# Patient Record
Sex: Female | Born: 1945
Health system: Southern US, Community
[De-identification: ages and names within clinical notes are randomized; demographics above are authoritative.]

## PROBLEM LIST (undated history)

## (undated) DIAGNOSIS — M1710 Unilateral primary osteoarthritis, unspecified knee: Secondary | ICD-10-CM

## (undated) DIAGNOSIS — IMO0002 Reserved for concepts with insufficient information to code with codable children: Secondary | ICD-10-CM

## (undated) DIAGNOSIS — G4733 Obstructive sleep apnea (adult) (pediatric): Secondary | ICD-10-CM

## (undated) DIAGNOSIS — I219 Acute myocardial infarction, unspecified: Secondary | ICD-10-CM

## (undated) DIAGNOSIS — I2510 Atherosclerotic heart disease of native coronary artery without angina pectoris: Secondary | ICD-10-CM

## (undated) DIAGNOSIS — M545 Low back pain, unspecified: Secondary | ICD-10-CM

## (undated) DIAGNOSIS — T6391XA Toxic effect of contact with unspecified venomous animal, accidental (unintentional), initial encounter: Secondary | ICD-10-CM

## (undated) DIAGNOSIS — T7840XA Allergy, unspecified, initial encounter: Secondary | ICD-10-CM

## (undated) DIAGNOSIS — R42 Dizziness and giddiness: Secondary | ICD-10-CM

## (undated) DIAGNOSIS — Z85858 Personal history of malignant neoplasm of other endocrine glands: Secondary | ICD-10-CM

## (undated) DIAGNOSIS — M5126 Other intervertebral disc displacement, lumbar region: Secondary | ICD-10-CM

## (undated) DIAGNOSIS — M5136 Other intervertebral disc degeneration, lumbar region: Secondary | ICD-10-CM

## (undated) DIAGNOSIS — K219 Gastro-esophageal reflux disease without esophagitis: Secondary | ICD-10-CM

## (undated) DIAGNOSIS — M51369 Other intervertebral disc degeneration, lumbar region without mention of lumbar back pain or lower extremity pain: Secondary | ICD-10-CM

## (undated) DIAGNOSIS — E785 Hyperlipidemia, unspecified: Secondary | ICD-10-CM

## (undated) DIAGNOSIS — R300 Dysuria: Secondary | ICD-10-CM

## (undated) DIAGNOSIS — N63 Unspecified lump in unspecified breast: Secondary | ICD-10-CM

## (undated) DIAGNOSIS — E8881 Metabolic syndrome: Secondary | ICD-10-CM

## (undated) DIAGNOSIS — J309 Allergic rhinitis, unspecified: Secondary | ICD-10-CM

## (undated) DIAGNOSIS — I10 Essential (primary) hypertension: Secondary | ICD-10-CM

## (undated) DIAGNOSIS — M5416 Radiculopathy, lumbar region: Secondary | ICD-10-CM

## (undated) DIAGNOSIS — F419 Anxiety disorder, unspecified: Secondary | ICD-10-CM

## (undated) DIAGNOSIS — N189 Chronic kidney disease, unspecified: Secondary | ICD-10-CM

## (undated) DIAGNOSIS — IMO0001 Reserved for inherently not codable concepts without codable children: Secondary | ICD-10-CM

## (undated) DIAGNOSIS — I209 Angina pectoris, unspecified: Secondary | ICD-10-CM

## (undated) DIAGNOSIS — H209 Unspecified iridocyclitis: Secondary | ICD-10-CM

## (undated) DIAGNOSIS — R7309 Other abnormal glucose: Secondary | ICD-10-CM

## (undated) DIAGNOSIS — E559 Vitamin D deficiency, unspecified: Secondary | ICD-10-CM

## (undated) HISTORY — DX: Radiculopathy, lumbar region: M54.16

## (undated) HISTORY — DX: Allergy, unspecified, initial encounter: T78.40XA

## (undated) HISTORY — DX: Atherosclerotic heart disease of native coronary artery without angina pectoris: I25.10

## (undated) HISTORY — PX: LUMBAR FUSION: SHX111

## (undated) HISTORY — DX: Unspecified lump in unspecified breast: N63.0

## (undated) HISTORY — DX: Personal history of malignant neoplasm of other endocrine glands: Z85.858

## (undated) HISTORY — PX: TUBAL LIGATION: SHX77

## (undated) HISTORY — DX: Metabolic syndrome: E88.810

## (undated) HISTORY — DX: Angina pectoris, unspecified: I20.9

## (undated) HISTORY — DX: Reserved for inherently not codable concepts without codable children: IMO0001

## (undated) HISTORY — DX: Other intervertebral disc displacement, lumbar region: M51.26

## (undated) HISTORY — DX: Reserved for concepts with insufficient information to code with codable children: IMO0002

## (undated) HISTORY — DX: Other abnormal glucose: R73.09

## (undated) HISTORY — DX: Dysuria: R30.0

## (undated) HISTORY — DX: Essential (primary) hypertension: I10

## (undated) HISTORY — PX: CARDIAC CATHETERIZATION: SHX172

## (undated) HISTORY — DX: Allergic rhinitis, unspecified: J30.9

## (undated) HISTORY — DX: Low back pain: M54.5

## (undated) HISTORY — DX: Low back pain, unspecified: M54.50

## (undated) HISTORY — PX: COLONOSCOPY: SHX174

## (undated) HISTORY — DX: Hyperlipidemia, unspecified: E78.5

## (undated) HISTORY — DX: Vitamin D deficiency, unspecified: E55.9

## (undated) HISTORY — DX: Gastro-esophageal reflux disease without esophagitis: K21.9

## (undated) HISTORY — DX: Unspecified iridocyclitis: H20.9

## (undated) HISTORY — DX: Obstructive sleep apnea (adult) (pediatric): G47.33

## (undated) HISTORY — PX: CYSTOSTOMY: SHX155

## (undated) HISTORY — DX: Unilateral primary osteoarthritis, unspecified knee: M17.10

## (undated) HISTORY — DX: Toxic effect of contact with unspecified venomous animal, accidental (unintentional), initial encounter: T63.91XA

## (undated) HISTORY — DX: Metabolic syndrome: E88.81

---

## 1968-11-02 HISTORY — PX: ABDOMINAL HYSTERECTOMY: SHX81

## 1978-11-02 HISTORY — PX: ANKLE SURGERY: SHX546

## 2001-11-02 HISTORY — PX: CARDIAC CATHETERIZATION: SHX172

## 2001-11-02 HISTORY — PX: NECK SURGERY: SHX720

## 2003-01-25 ENCOUNTER — Encounter: Payer: Self-pay | Admitting: Neurosurgery

## 2003-01-31 ENCOUNTER — Encounter: Payer: Self-pay | Admitting: Neurosurgery

## 2003-01-31 ENCOUNTER — Ambulatory Visit (HOSPITAL_COMMUNITY): Admission: RE | Admit: 2003-01-31 | Discharge: 2003-02-01 | Payer: Self-pay | Admitting: Neurosurgery

## 2003-04-24 ENCOUNTER — Encounter: Admission: RE | Admit: 2003-04-24 | Discharge: 2003-04-24 | Payer: Self-pay | Admitting: Neurosurgery

## 2003-04-24 ENCOUNTER — Encounter: Payer: Self-pay | Admitting: Neurosurgery

## 2005-01-01 ENCOUNTER — Ambulatory Visit: Payer: Self-pay | Admitting: Unknown Physician Specialty

## 2005-02-11 ENCOUNTER — Ambulatory Visit: Payer: Self-pay

## 2005-11-13 ENCOUNTER — Ambulatory Visit: Payer: Self-pay | Admitting: Family Medicine

## 2006-01-28 ENCOUNTER — Ambulatory Visit: Payer: Self-pay | Admitting: Family Medicine

## 2006-03-14 ENCOUNTER — Emergency Department: Payer: Self-pay | Admitting: Emergency Medicine

## 2006-03-23 ENCOUNTER — Ambulatory Visit: Payer: Self-pay | Admitting: Gastroenterology

## 2007-02-23 ENCOUNTER — Ambulatory Visit: Payer: Self-pay

## 2007-03-04 ENCOUNTER — Ambulatory Visit: Payer: Self-pay

## 2007-09-07 ENCOUNTER — Ambulatory Visit: Payer: Self-pay | Admitting: Family Medicine

## 2008-01-31 ENCOUNTER — Ambulatory Visit: Payer: Self-pay | Admitting: Family Medicine

## 2008-03-07 ENCOUNTER — Ambulatory Visit: Payer: Self-pay | Admitting: General Surgery

## 2008-05-11 ENCOUNTER — Ambulatory Visit: Payer: Self-pay | Admitting: General Surgery

## 2008-06-11 ENCOUNTER — Ambulatory Visit: Payer: Self-pay | Admitting: General Surgery

## 2009-05-23 ENCOUNTER — Ambulatory Visit: Payer: Self-pay | Admitting: Neurosurgery

## 2009-06-11 ENCOUNTER — Encounter: Admission: RE | Admit: 2009-06-11 | Discharge: 2009-06-11 | Payer: Self-pay | Admitting: Neurosurgery

## 2009-06-18 ENCOUNTER — Ambulatory Visit: Payer: Self-pay | Admitting: General Surgery

## 2009-07-03 ENCOUNTER — Encounter: Admission: RE | Admit: 2009-07-03 | Discharge: 2009-07-03 | Payer: Self-pay | Admitting: Neurosurgery

## 2009-07-27 ENCOUNTER — Ambulatory Visit: Payer: Self-pay | Admitting: Family Medicine

## 2009-07-29 ENCOUNTER — Ambulatory Visit: Payer: Self-pay | Admitting: Internal Medicine

## 2009-10-13 ENCOUNTER — Ambulatory Visit: Payer: Self-pay | Admitting: Unknown Physician Specialty

## 2009-10-21 ENCOUNTER — Ambulatory Visit: Payer: Self-pay | Admitting: Unknown Physician Specialty

## 2010-02-06 ENCOUNTER — Ambulatory Visit: Payer: Self-pay | Admitting: Unknown Physician Specialty

## 2010-02-07 ENCOUNTER — Ambulatory Visit: Payer: Self-pay | Admitting: Unknown Physician Specialty

## 2010-02-11 ENCOUNTER — Ambulatory Visit: Payer: Self-pay | Admitting: Unknown Physician Specialty

## 2010-02-13 ENCOUNTER — Inpatient Hospital Stay: Payer: Self-pay | Admitting: Unknown Physician Specialty

## 2010-09-08 ENCOUNTER — Ambulatory Visit: Payer: Self-pay | Admitting: General Surgery

## 2010-11-02 HISTORY — PX: BLADDER SURGERY: SHX569

## 2010-11-24 ENCOUNTER — Encounter: Payer: Self-pay | Admitting: Neurosurgery

## 2010-12-09 ENCOUNTER — Ambulatory Visit: Payer: Self-pay | Admitting: Emergency Medicine

## 2010-12-30 ENCOUNTER — Ambulatory Visit: Payer: Self-pay | Admitting: Urology

## 2011-03-20 NOTE — Op Note (Signed)
Jodi Kirby, Jodi Kirby                        ACCOUNT NO.:  [id number]   MEDICAL RECORD NO.:  [id number]                   PATIENT TYPE:  OIB   LOCATION:  3172                                 FACILITY:  [facility]   PHYSICIAN:  Kathaleen Maser. Pool, M.D.                 DATE OF BIRTH:  04-06-1946   DATE OF PROCEDURE:  01/31/2003  DATE OF DISCHARGE:                                 OPERATIVE REPORT   PREOPERATIVE DIAGNOSES:  Left C5-6 herniated nucleus pulposus with  radiculopathy.   POSTOPERATIVE DIAGNOSES:  Left C5-6 herniated nucleus pulposus with  radiculopathy.   OPERATION PERFORMED:  C5-6 anterior cervical diskectomy and fusion with  allograft and anterior plating.   SURGEON:  Kathaleen Maser. Pool, M.D.   ASSISTANT:  Donalee Citrin, M.D.   ANESTHESIA:  General endotracheal.   INDICATIONS FOR PROCEDURE:  The patient is a 65 year old female with a  history of neck and left upper extremity pain consistent with a left-sided  C6 radiculopathy which has failed conservative management.  MRI scan  demonstrates evidence of a significant leftward C5-6 disk herniation with  compression of the left-sided C6 nerve root.  We discussed options available  for management including the possibility of undergoing a C5-6 anterior  cervical diskectomy and fusion with allograft and anterior plating for  hopeful improvement in her symptoms.  I discussed the risks and benefits  involved in the surgery and the patient wished to proceed.   DESCRIPTION OF PROCEDURE:  The patient was taken to the operating room and  placed on the table in supine position.  After adequate level of general  anesthesia was achieved, the patient was positioned with the neck slightly  extended and held in place with halter traction.  The patient's anterior  cervical region was prepped and draped sterilely.  A 10 blade was used to  make a linear skin incision overlying the C5-6 interspace through the skin,  carried down sharply to the  platysma.  The platysma was then divided  vertically and dissected proceeded along the medial border of the  sternocleidomastoid muscle and carotid sheath.  Trachea and esophagus were  mobilized and retracted toward the left.  Prevertebral fascia was stripped  off the anterior spinal column.  The longus colli muscles were then elevated  bilaterally using electrocautery.  Deep self-retaining retractor was placed.  Intraoperative fluoroscopy was used and level was confirmed.  The disk  spaces were then incised with a 15 blade in rectangular fashion.  A wide  disk space cleanout was then achieved using pituitary rongeurs, forward and  backward angled Carlens curets, Kerrison rongeurs and a high speed drill.  All elements of the disk were removed down to the posterior annulus.  Microscope was brought into the field and used for microdissection and to  complete the diskectomy.  The remaining aspects of the annulus and  osteophytes were removed using a high speed drill down  to the level of the  posterior longitudinal ligament.  The posterior longitudinal ligament was  then elevated and resected in piecemeal fashion using Kerrison rongeurs.  The underlying thecal sac was identified.  A wide central decompression was  then performed by undercutting the bodies of C5 and C6 using Kerrison  rongeurs.  All elements of the disk herniation out to the left side at C5-6  were completely resected.  The decompression then followed out to the left  sided C6 foramen.  The C6 nerve root was identified and a wide anterior  foraminotomy was performed.  Decompression proceeded out to the right side  at the C6 foramen.  The C6 nerve root was once again identified and found to  be free of any compression.  The wound was then irrigated with antibiotic  solution.  The disk space was then distracted and a 7 mm patellar wedge  allograft was then impacted into place and recessed approximately 1 mm from  the anterior  cortical surface.  A 23 mm Atlantis anterior cervical plate was  placed over the C5 and C6 levels.  This was then attached under fluoroscopic  guidance using 13 mm variable angle screws, two each at C5 and C6.  All four  screws were given a final tightening and found to be solidly within bone.  Locking screws were engaged at both levels.  Final images revealed good  position of bone grafts  and hardware at proper operative level with normal  alignment of the spine.  The wound was then inspected for hemostasis which  was found to be good.  It was then closed in typical fashion.  Steri-Strips  and sterile dressing were applied.  There were no apparent complications.  The patient tolerated the procedure well and returned to the recovery room  postoperatively.                                                 Henry A. Pool, M.D.    HAP/MEDQ  D:  01/31/2003  T:  01/31/2003  Job:  [id number]

## 2011-06-30 ENCOUNTER — Ambulatory Visit: Payer: Self-pay | Admitting: Internal Medicine

## 2011-07-02 ENCOUNTER — Ambulatory Visit: Payer: Self-pay

## 2011-07-13 ENCOUNTER — Ambulatory Visit: Payer: Self-pay | Admitting: Urology

## 2011-08-18 ENCOUNTER — Ambulatory Visit: Payer: Self-pay | Admitting: Unknown Physician Specialty

## 2011-08-18 HISTORY — PX: KNEE SURGERY: SHX244

## 2011-09-10 ENCOUNTER — Ambulatory Visit: Payer: Self-pay | Admitting: Family Medicine

## 2012-04-19 ENCOUNTER — Emergency Department: Payer: Self-pay | Admitting: *Deleted

## 2012-09-12 ENCOUNTER — Ambulatory Visit: Payer: Self-pay | Admitting: General Surgery

## 2013-02-08 ENCOUNTER — Ambulatory Visit: Payer: Self-pay | Admitting: Family Medicine

## 2013-02-09 ENCOUNTER — Ambulatory Visit (INDEPENDENT_AMBULATORY_CARE_PROVIDER_SITE_OTHER): Payer: Medicare Other | Admitting: Cardiovascular Disease

## 2013-02-09 ENCOUNTER — Encounter: Payer: Self-pay | Admitting: Cardiovascular Disease

## 2013-02-09 VITALS — BP 128/76 | HR 52 | Ht 63.0 in | Wt 183.5 lb

## 2013-02-09 DIAGNOSIS — I2510 Atherosclerotic heart disease of native coronary artery without angina pectoris: Secondary | ICD-10-CM

## 2013-02-09 DIAGNOSIS — I10 Essential (primary) hypertension: Secondary | ICD-10-CM

## 2013-02-09 DIAGNOSIS — G4733 Obstructive sleep apnea (adult) (pediatric): Secondary | ICD-10-CM

## 2013-02-09 DIAGNOSIS — I25118 Atherosclerotic heart disease of native coronary artery with other forms of angina pectoris: Secondary | ICD-10-CM | POA: Insufficient documentation

## 2013-02-09 DIAGNOSIS — R0789 Other chest pain: Secondary | ICD-10-CM

## 2013-02-09 DIAGNOSIS — Z9989 Dependence on other enabling machines and devices: Secondary | ICD-10-CM

## 2013-02-09 DIAGNOSIS — E785 Hyperlipidemia, unspecified: Secondary | ICD-10-CM | POA: Insufficient documentation

## 2013-02-09 MED ORDER — NITROGLYCERIN 0.4 MG SL SUBL: 0.4000 mg | SUBLINGUAL_TABLET | SUBLINGUAL | Status: DC | PRN

## 2013-02-09 NOTE — Assessment & Plan Note (Signed)
Blood pressure is well controlled on today's visit. No changes made to the medications. 

## 2013-02-09 NOTE — Assessment & Plan Note (Signed)
She reports that she is compliant with her CPAP, doing well

## 2013-02-09 NOTE — Addendum Note (Signed)
Addended by: Antonieta Iba on: 02/09/2013 01:16 PM   Modules accepted: Level of Service

## 2013-02-09 NOTE — Assessment & Plan Note (Signed)
Prior lab work was reviewed from October 2013. Most recent lab work not available. She is tolerating Crestor 2.5 mg daily. I suspect her cholesterol will continue to be elevated. Other options for her include zetia, though this is expensive. Second option would be colestipol (welchol), fenofibrate).

## 2013-02-09 NOTE — Progress Notes (Signed)
Patient ID: Jodi Kirby, female    DOB: 1946/03/07, 67 y.o.   MRN: [id number]  HPI Comments: Jodi Kirby is a very pleasant 67 year old woman with a history of mild carotid arterial disease on catheterization in 2003 with 20% disease in her RCA and LAD, history of hyperlipidemia, back surgery, obstructive sleep apnea on CPAP, who presents for evaluation by referral from Dr. Carlynn Purl for chest pressure.  She reports that for the past few weeks, she has had some "pressure-like" feelings in her left chest. Typically this comes on at rest while sitting. Symptoms last for several seconds and then resolve. Exertion does not make her symptoms worse. She thinks the symptoms are better over the past several weeks but she is still having them. No significant shortness of breath, edema, lightheadedness.she is active at baseline but does not do regular exercise.  Last stress test was several years ago She is tolerating Crestor 2.5 mg daily. Side effects on other statins including muscle cramps Recent lab work done but not available Prior lab work documents total cholesterol of 228 in October 2013 with LDL 146  EKG shows sinus bradycardia with rate 52 beats per minute with no significant ST or T wave changes   Outpatient Encounter Prescriptions as of 02/09/2013  Medication Sig Dispense Refill  . aspirin 81 MG tablet Take 81 mg by mouth daily.      . Calcium 600-200 MG-UNIT per tablet Take 1 tablet by mouth daily.      Marland Kitchen dexlansoprazole (DEXILANT) 60 MG capsule Take 60 mg by mouth daily.      Marland Kitchen EPINEPHrine (EPIPEN 2-PAK) 0.3 mg/0.3 mL DEVI Inject 0.3 mg into the muscle as needed.       . fluticasone (FLONASE) 50 MCG/ACT nasal spray Place 2 sprays into the nose daily.      Marland Kitchen levalbuterol (XOPENEX HFA) 45 MCG/ACT inhaler Inhale 1-2 puffs into the lungs every 4 (four) hours as needed for wheezing.      . Multiple Vitamins-Minerals (CENTRUM SILVER PO) Take by mouth daily.      .  Olmesartan-Amlodipine-HCTZ (TRIBENZOR) 40-10-12.5 MG TABS Take by mouth daily.      . rosuvastatin (CRESTOR) 5 MG tablet Take 1/2 to 1 tablet daily.      . nitroGLYCERIN (NITROSTAT) 0.4 MG SL tablet Place 1 tablet (0.4 mg total) under the tongue every 5 (five) minutes as needed for chest pain.  25 tablet  6   No facility-administered encounter medications on file as of 02/09/2013.   and  Review of Systems  Constitutional: Negative.   HENT: Negative.   Eyes: Negative.   Respiratory: Positive for chest tightness.   Cardiovascular: Positive for chest pain.  Gastrointestinal: Negative.   Musculoskeletal: Negative.   Skin: Negative.   Neurological: Negative.   Psychiatric/Behavioral: Negative.   All other systems reviewed and are negative.    BP 128/76  Pulse 52  Ht 5\' 3"  (1.6 m)  Wt 183 lb 8 oz (83.235 kg)  BMI 32.51 kg/m2  Physical Exam  Nursing note and vitals reviewed. Constitutional: She is oriented to person, place, and time. She appears well-developed and well-nourished.  HENT:  Head: Normocephalic.  Nose: Nose normal.  Mouth/Throat: Oropharynx is clear and moist.  Eyes: Conjunctivae are normal. Pupils are equal, round, and reactive to light.  Neck: Normal range of motion. Neck supple. No JVD present.  Cardiovascular: Normal rate, regular rhythm, S1 normal, S2 normal, normal heart sounds and intact distal pulses.  Exam reveals no gallop  and no friction rub.   No murmur heard. Pulmonary/Chest: Effort normal and breath sounds normal. No respiratory distress. She has no wheezes. She has no rales. She exhibits no tenderness.  Abdominal: Soft. Bowel sounds are normal. She exhibits no distension. There is no tenderness.  Musculoskeletal: Normal range of motion. She exhibits no edema and no tenderness.  Lymphadenopathy:    She has no cervical adenopathy.  Neurological: She is alert and oriented to person, place, and time. Coordination normal.  Skin: Skin is warm and dry. No  rash noted. No erythema.  Psychiatric: She has a normal mood and affect. Her behavior is normal. Judgment and thought content normal.    Assessment and Plan

## 2013-02-09 NOTE — Assessment & Plan Note (Signed)
20% disease in RCA and LAD on prior catheterization over 10 years ago. Would continue aggressive cholesterol management

## 2013-02-09 NOTE — Assessment & Plan Note (Addendum)
Etiology of her chest pain is uncertain though some atypical features. Symptoms happen at rest, last only several seconds. No symptoms with exertion. EKG essentially normal. We had a long discussion with her about various treatment options including medical management him a watchful waiting to see if symptoms progress, stress testing in the various types of stress test including treadmill, treadmill Myoview or pharmacologic Myoview. We also talked about potentially doing a cardiac catheterization. Old cardiac catheterization report was reviewed from 2003 that showed minimal disease. She does not know how she would like to proceed. Given her chest pain/pressure at rest, atypical presentation, minimal coronary disease on prior cardiac catheterization, I feel it is safe to watch her for now. We have given her a prescription for nitroglycerin sublingual for any severe chest pain. She will call us if symptoms get worse at which time we could decide  how to proceed. She wonders if it could be gas is sometimes she has relief with belching.

## 2013-02-09 NOTE — Patient Instructions (Addendum)
Please take nitroglycerin as needed for severe chest pain Call the office for worsening chest pain, we might do a stress test  Please call us if you have new issues that need to be addressed before your next appt.  Your physician wants you to follow-up in: 6 months.  You will receive a reminder letter in the mail two months in advance. If you don't receive a letter, please call our office to schedule the follow-up appointment.

## 2013-04-19 ENCOUNTER — Encounter: Payer: Self-pay | Admitting: *Deleted

## 2013-08-10 ENCOUNTER — Encounter: Payer: Self-pay | Admitting: Cardiovascular Disease

## 2013-08-10 ENCOUNTER — Ambulatory Visit (INDEPENDENT_AMBULATORY_CARE_PROVIDER_SITE_OTHER): Payer: Medicare Other | Admitting: Cardiovascular Disease

## 2013-08-10 VITALS — BP 118/72 | HR 51 | Ht 63.0 in | Wt 185.8 lb

## 2013-08-10 DIAGNOSIS — I10 Essential (primary) hypertension: Secondary | ICD-10-CM

## 2013-08-10 DIAGNOSIS — E785 Hyperlipidemia, unspecified: Secondary | ICD-10-CM

## 2013-08-10 DIAGNOSIS — I2510 Atherosclerotic heart disease of native coronary artery without angina pectoris: Secondary | ICD-10-CM

## 2013-08-10 DIAGNOSIS — R0789 Other chest pain: Secondary | ICD-10-CM

## 2013-08-10 NOTE — Assessment & Plan Note (Signed)
Suggested she stay on her crestor.

## 2013-08-10 NOTE — Assessment & Plan Note (Signed)
Blood pressure is well controlled on today's visit. No changes made to the medications. 

## 2013-08-10 NOTE — Patient Instructions (Addendum)
You are doing well. No medication changes were made.  We will schedule a stress test (lexiscan myoview) at [facility] for chest pain No caffeine for 24 hours before No food the morning of the test  Please call us if you have new issues that need to be addressed before your next appt.  Your physician wants you to follow-up in: 6 months.  You will receive a reminder letter in the mail two months in advance. If you don't receive a letter, please call our office to schedule the follow-up appointment.  [facility] MYOVIEW  Your caregiver has ordered a Stress Test with nuclear imaging. The purpose of this test is to evaluate the blood supply to your heart muscle. This procedure is referred to as a "Non-Invasive Stress Test." This is because other than having an IV started in your vein, nothing is inserted or "invades" your body. Cardiac stress tests are done to find areas of poor blood flow to the heart by determining the extent of coronary artery disease (CAD). Some patients exercise on a treadmill, which naturally increases the blood flow to your heart, while others who are  unable to walk on a treadmill due to physical limitations have a pharmacologic/chemical stress agent called Lexiscan . This medicine will mimic walking on a treadmill by temporarily increasing your coronary blood flow.   Please note: these test may take anywhere between 2-4 hours to complete  PLEASE REPORT TO [facility]ICAL [facility]ALL ENTRANCE  THE VOLUNTEERS AT THE FIRST DESK WILL DIRECT YOU WHERE TO GO  Date of Procedure:__________Wednesday, Oct 15_________  Arrival Time for Procedure:___________7:00 am___________    PLEASE NOTIFY THE OFFICE AT LEAST 24 HOURS IN ADVANCE IF YOU ARE UNABLE TO KEEP YOUR APPOINTMENT.  [phone] AND  PLEASE NOTIFY NUCLEAR MEDICINE AT [facility] AT LEAST 24 HOURS IN ADVANCE IF YOU ARE UNABLE TO KEEP YOUR APPOINTMENT. [phone]  How to prepare for your Myoview test:  1. Do not eat or drink after  midnight 2. No caffeine for 24 hours prior to test 3. No smoking 24 hours prior to test. 4. Your medication may be taken with water.  If your doctor stopped a medication because of this test, do not take that medication. 5. Ladies, please do not wear dresses.  Skirts or pants are appropriate. Please wear a short sleeve shirt. 6. No perfume, cologne or lotion. 7. Wear comfortable walking shoes. No heels!

## 2013-08-10 NOTE — Assessment & Plan Note (Signed)
She continues to have chest discomfort symptoms. She is unable to treadmill. We will schedule her for a pharmacologic Myoview.

## 2013-08-10 NOTE — Progress Notes (Addendum)
Patient ID: Jodi Kirby, female    DOB: [birth year], 67 y.o.   MRN: [id number]  HPI Comments: Jodi Kirby is a very pleasant 67 year old woman with a history of coronary artery arterial disease on catheterization in 2003 with 20% disease in her RCA and LAD, history of hyperlipidemia, back surgery, obstructive sleep apnea on CPAP, patient of Dr. Carlynn Purl who previously presented for symptoms of chest pressure. She presents for routine followup  On her last clinic visit, we had a long discussion about her symptoms. She preferred to hold off on any testing. In followup today, she reports having continued chest pain symptoms, feels tired. She is requesting a stress test. Is unable to treadmill secondary to previous back surgery, knee surgery, prior ankle fracture. He continues to have pressure-like symptoms in her chest, sometimes at rest, sometimes with exertion  Prior stress test was several years ago She is tolerating Crestor 2.5 mg daily. Side effects on other statins including muscle cramps Recent lab work done but not available Lab work April 2014 showing total cholesterol 162, LDL 76, HDL 58  EKG shows sinus bradycardia with rate 51 beats per minute with no significant ST or T wave changes   Outpatient Encounter Prescriptions as of 08/10/2013  Medication Sig Dispense Refill  . aspirin 81 MG tablet Take 81 mg by mouth daily.      . Calcium 600-200 MG-UNIT per tablet Take 1 tablet by mouth daily.      Marland Kitchen dexlansoprazole (DEXILANT) 60 MG capsule Take 60 mg by mouth daily.      Marland Kitchen EPINEPHrine (EPIPEN 2-PAK) 0.3 mg/0.3 mL DEVI Inject 0.3 mg into the muscle as needed.       . fluticasone (FLONASE) 50 MCG/ACT nasal spray Place 2 sprays into the nose daily.      Marland Kitchen levalbuterol (XOPENEX HFA) 45 MCG/ACT inhaler Inhale 1-2 puffs into the lungs every 4 (four) hours as needed for wheezing.      . Multiple Vitamins-Minerals (CENTRUM SILVER PO) Take by mouth daily.      . nitroGLYCERIN (NITROSTAT)  0.4 MG SL tablet Place 1 tablet (0.4 mg total) under the tongue every 5 (five) minutes as needed for chest pain.  25 tablet  6  . Olmesartan-Amlodipine-HCTZ (TRIBENZOR) 40-10-12.5 MG TABS Take by mouth daily.      . rosuvastatin (CRESTOR) 5 MG tablet Take 1/2 to 1 tablet daily.       No facility-administered encounter medications on file as of 08/10/2013.    Review of Systems  Constitutional: Negative.   HENT: Negative.   Eyes: Negative.   Respiratory: Positive for chest tightness.   Cardiovascular: Positive for chest pain.  Gastrointestinal: Negative.   Endocrine: Negative.   Musculoskeletal: Negative.   Skin: Negative.   Allergic/Immunologic: Negative.   Neurological: Negative.   Hematological: Negative.   Psychiatric/Behavioral: Negative.   All other systems reviewed and are negative.    BP 118/72  Pulse 51  Ht 5\' 3"  (1.6 m)  Wt 185 lb 12 oz (84.256 kg)  BMI 32.91 kg/m2  Physical Exam  Nursing note and vitals reviewed. Constitutional: She is oriented to person, place, and time. She appears well-developed and well-nourished.  HENT:  Head: Normocephalic.  Nose: Nose normal.  Mouth/Throat: Oropharynx is clear and moist.  Eyes: Conjunctivae are normal. Pupils are equal, round, and reactive to light.  Neck: Normal range of motion. Neck supple. No JVD present.  Cardiovascular: Normal rate, regular rhythm, S1 normal, S2 normal, normal heart sounds and intact  distal pulses.  Exam reveals no gallop and no friction rub.   No murmur heard. Pulmonary/Chest: Effort normal and breath sounds normal. No respiratory distress. She has no wheezes. She has no rales. She exhibits no tenderness.  Abdominal: Soft. Bowel sounds are normal. She exhibits no distension. There is no tenderness.  Musculoskeletal: Normal range of motion. She exhibits no edema and no tenderness.  Lymphadenopathy:    She has no cervical adenopathy.  Neurological: She is alert and oriented to person, place, and time.  Coordination normal.  Skin: Skin is warm and dry. No rash noted. No erythema.  Psychiatric: She has a normal mood and affect. Her behavior is normal. Judgment and thought content normal.    Assessment and Plan

## 2013-08-11 NOTE — Assessment & Plan Note (Signed)
Seen previously on preior cardiac cath. Now with persistent chest pain. Stress test ordered.

## 2013-08-16 ENCOUNTER — Ambulatory Visit: Payer: Self-pay | Admitting: Cardiovascular Disease

## 2013-08-16 DIAGNOSIS — R079 Chest pain, unspecified: Secondary | ICD-10-CM

## 2013-08-17 ENCOUNTER — Other Ambulatory Visit: Payer: Self-pay

## 2013-08-17 DIAGNOSIS — I2510 Atherosclerotic heart disease of native coronary artery without angina pectoris: Secondary | ICD-10-CM

## 2013-08-17 DIAGNOSIS — R0789 Other chest pain: Secondary | ICD-10-CM

## 2013-08-21 ENCOUNTER — Telehealth: Payer: Self-pay

## 2013-08-21 NOTE — Telephone Encounter (Signed)
Spoke w/ pt.  She is aware of results.  

## 2013-08-21 NOTE — Telephone Encounter (Signed)
Message copied by Marilynne Halsted on Mon Aug 21, 2013 10:22 AM ------      Message from: Antonieta Iba      Created: Sun Aug 20, 2013  6:49 PM       Normal stress myoview ------

## 2013-09-13 ENCOUNTER — Ambulatory Visit: Payer: Self-pay | Admitting: General Surgery

## 2013-09-14 ENCOUNTER — Encounter: Payer: Self-pay | Admitting: General Surgery

## 2013-09-25 ENCOUNTER — Ambulatory Visit (INDEPENDENT_AMBULATORY_CARE_PROVIDER_SITE_OTHER): Payer: Medicare Other | Admitting: General Surgery

## 2013-09-25 ENCOUNTER — Encounter: Payer: Self-pay | Admitting: General Surgery

## 2013-09-25 VITALS — BP 130/70 | HR 76 | Resp 14 | Ht 63.0 in | Wt 185.0 lb

## 2013-09-25 DIAGNOSIS — N6019 Diffuse cystic mastopathy of unspecified breast: Secondary | ICD-10-CM

## 2013-09-25 NOTE — Patient Instructions (Signed)
Bilateral screening mammogram in 1 year.

## 2013-09-25 NOTE — Progress Notes (Signed)
Patient ID: Jodi Kirby, female   DOB: [birth year], 67 y.o.   MRN: [id number]  Chief Complaint  Patient presents with  . Follow-up    mammogram    HPI Jodi Kirby is a 67 y.o. female who presents for a breast evaluation. The most recent mammogram was done on 09/13/13. Patient does perform regular self breast checks and gets regular mammograms done.    HPI  Past Medical History  Diagnosis Date  . Hyperlipidemia   . Coronary artery disease   . Obstructive sleep apnea   . Esophageal reflux   . Essential hypertension, benign   . Dysmetabolic syndrome X   . Other abnormal glucose   . Toxic effect of venom   . Allergic rhinitis, cause unspecified   . Unspecified asthma(493.90)   . Lumbago   . Osteoarthrosis, unspecified whether generalized or localized, lower leg   . Lump or mass in breast   . Dysuria   . 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 Unspecified iridocyclitis   . Unspecified vitamin D deficiency   . Other and unspecified angina pectoris   . Myalgia and myositis, unspecified   . MI (myocardial infarction)     Past Surgical History  Procedure Laterality Date  . Knee surgery  08/18/2011    arthroscopic right  . Lumbar fusion      L4-5, S-1  . Cystostomy    . Cardiac catheterization  2003    [place]  . Cardiac catheterization      [place]  . Neck surgery  2003  . Ankle surgery Right 1980  . Colonoscopy  2008    Dr. Servando Snare  . Tubal ligation    . Abdominal hysterectomy  1970  . Bladder surgery  2012    History reviewed. No pertinent family history.  Social History History  Substance Use Topics  . Smoking status: Never Smoker   . Smokeless tobacco: Never Used  . Alcohol Use: No    Allergies  Allergen Reactions  . Niaspan [Niacin Er]   . Statins     Current Outpatient Prescriptions  Medication Sig Dispense Refill  . acetaminophen-codeine (TYLENOL #3) 300-30 MG per tablet Take 1  tablet by mouth daily.      Marland Kitchen aspirin 81 MG tablet Take 81 mg by mouth daily.      . Calcium 600-200 MG-UNIT per tablet Take 1 tablet by mouth daily.      Marland Kitchen dexlansoprazole (DEXILANT) 60 MG capsule Take 60 mg by mouth daily.      Marland Kitchen EPINEPHrine (EPIPEN 2-PAK) 0.3 mg/0.3 mL DEVI Inject 0.3 mg into the muscle as needed.       . fluticasone (FLONASE) 50 MCG/ACT nasal spray Place 2 sprays into the nose daily.      Marland Kitchen levalbuterol (XOPENEX HFA) 45 MCG/ACT inhaler Inhale 1-2 puffs into the lungs every 4 (four) hours as needed for wheezing.      . Multiple Vitamins-Minerals (CENTRUM SILVER PO) Take by mouth daily.      . nitroGLYCERIN (NITROSTAT) 0.4 MG SL tablet Place 1 tablet (0.4 mg total) under the tongue every 5 (five) minutes as needed for chest pain.  25 tablet  6  . Olmesartan-Amlodipine-HCTZ (TRIBENZOR) 40-10-12.5 MG TABS Take by mouth daily.      . rosuvastatin (CRESTOR) 5 MG tablet Take 1/2 to 1 tablet daily.       No current facility-administered medications for this visit.    Review  of Systems Review of Systems  Constitutional: Negative.   Respiratory: Negative.   Cardiovascular: Negative.     Blood pressure 130/70, pulse 76, resp. rate 14, height 5\' 3"  (1.6 m), weight 185 lb (83.915 kg).  Physical Exam Physical Exam  Constitutional: She is oriented to person, place, and time. She appears well-developed and well-nourished.  Eyes: Conjunctivae are normal. No scleral icterus.  Neck: No thyromegaly present.  Cardiovascular: Normal rate, regular rhythm and normal heart sounds.   Pulmonary/Chest: Breath sounds normal.  Abdominal: Soft. Bowel sounds are normal. There is no hepatosplenomegaly. There is no tenderness. No hernia.  Lymphadenopathy:    She has no cervical adenopathy.    She has no axillary adenopathy.  Neurological: She is alert and oriented to person, place, and time.  Skin: Skin is warm.    Data Reviewed Mammogram reviewed  Assessment    Stable exam     Plan    Bilateral screening mammogram in 1 year.       Gianni Mihalik G 09/25/2013, 4:18 PM

## 2013-11-16 ENCOUNTER — Ambulatory Visit: Payer: Self-pay | Admitting: Family Medicine

## 2013-11-21 ENCOUNTER — Ambulatory Visit: Payer: Self-pay | Admitting: Family Medicine

## 2013-12-14 ENCOUNTER — Ambulatory Visit: Payer: Self-pay | Admitting: Gastroenterology

## 2014-03-18 ENCOUNTER — Emergency Department: Payer: Self-pay | Admitting: Emergency Medicine

## 2014-03-18 LAB — COMPREHENSIVE METABOLIC PANEL
ALBUMIN: 3.6 g/dL (ref 3.4–5.0)
ALT: 24 U/L (ref 12–78)
Alkaline Phosphatase: 91 U/L
Anion Gap: 7 (ref 7–16)
BUN: 11 mg/dL (ref 7–18)
Bilirubin,Total: 0.2 mg/dL (ref 0.2–1.0)
Calcium, Total: 8.9 mg/dL (ref 8.5–10.1)
Chloride: 107 mmol/L (ref 98–107)
Co2: 27 mmol/L (ref 21–32)
Creatinine: 1.34 mg/dL — ABNORMAL HIGH (ref 0.60–1.30)
EGFR (African American): 47 — ABNORMAL LOW
GFR CALC NON AF AMER: 41 — AB
Glucose: 106 mg/dL — ABNORMAL HIGH (ref 65–99)
Osmolality: 281 (ref 275–301)
Potassium: 3.6 mmol/L (ref 3.5–5.1)
SGOT(AST): 26 U/L (ref 15–37)
Sodium: 141 mmol/L (ref 136–145)
TOTAL PROTEIN: 7 g/dL (ref 6.4–8.2)

## 2014-03-18 LAB — TROPONIN I: Troponin-I: <0.02 ng/mL

## 2014-03-18 LAB — CBC
HCT: 36.3 % (ref 35.0–47.0)
HGB: 11.9 g/dL — AB (ref 12.0–16.0)
MCH: 27.4 pg (ref 26.0–34.0)
MCHC: 32.9 g/dL (ref 32.0–36.0)
MCV: 84 fL (ref 80–100)
Platelet: 212 10*3/uL (ref 150–440)
RBC: 4.34 10*6/uL (ref 3.80–5.20)
RDW: 12.3 % (ref 11.5–14.5)
WBC: 5.9 10*3/uL (ref 3.6–11.0)

## 2014-04-26 ENCOUNTER — Encounter: Payer: Self-pay | Admitting: General Surgery

## 2014-04-26 ENCOUNTER — Ambulatory Visit (INDEPENDENT_AMBULATORY_CARE_PROVIDER_SITE_OTHER): Payer: Medicare Other | Admitting: General Surgery

## 2014-04-26 VITALS — BP 118/80 | HR 86 | Resp 16 | Ht 63.0 in | Wt 181.0 lb

## 2014-04-26 DIAGNOSIS — N644 Mastodynia: Secondary | ICD-10-CM

## 2014-04-26 NOTE — Patient Instructions (Addendum)
Patient advised to take Advil or Aleve. Take 2 tabs by mouth twice daily with food for 10 days. Patient to return in 2 weeks for follow up. The patient is aware to call back for any questions or concerns.

## 2014-04-26 NOTE — Progress Notes (Signed)
Patient ID: Jodi Kirby, female   DOB: [birth year], 68 y.o.   MRN: [id number]  Chief Complaint  Patient presents with  . Follow-up    burning in left breast    HPI Jodi Kirby is a 68 y.o. female who presents for an evaluation of left breast burning. The patient states this started approximately 2 weeks ago. The burning sensation is also accompanied by a shooting pain at times. She states the pain comes and goes. The pain lasts for a couple of minutes. She denies any triggers associated to this issue.  No other complaints with the breasts at this time.   HPI  Past Medical History  Diagnosis Date  . Hyperlipidemia   . Coronary artery disease   . Obstructive sleep apnea   . Esophageal reflux   . Essential hypertension, benign   . Dysmetabolic syndrome X   . Other abnormal glucose   . Toxic effect of venom(989.5)   . Allergic rhinitis, cause unspecified   . Unspecified asthma(493.90)   . Lumbago   . Osteoarthrosis, unspecified whether generalized or localized, lower leg   . Lump or mass in breast   . Dysuria   . 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 Unspecified iridocyclitis   . Unspecified vitamin D deficiency   . Other and unspecified angina pectoris   . Myalgia and myositis, unspecified   . MI (myocardial infarction)     Past Surgical History  Procedure Laterality Date  . Knee surgery  08/18/2011    arthroscopic right  . Lumbar fusion      L4-5, S-1  . Cystostomy    . Cardiac catheterization  2003    [facility]  . Cardiac catheterization      [facility]  . Neck surgery  2003  . Ankle surgery Right 1980  . Colonoscopy  2008    Dr. Servando SnareWohl  . Tubal ligation    . Abdominal hysterectomy  1970  . Bladder surgery  2012    History reviewed. No pertinent family history.  Social History History  Substance Use Topics  . Smoking status: Never Smoker   . Smokeless tobacco: Never Used  . Alcohol Use: No     Allergies  Allergen Reactions  . Niaspan [Niacin Er]   . Statins     Current Outpatient Prescriptions  Medication Sig Dispense Refill  . acetaminophen-codeine (TYLENOL #3) 300-30 MG per tablet Take 1 tablet by mouth daily.      . Alendronate Sodium (FOSAMAX PO) Take 1 tablet by mouth once a week.      Marland Kitchen. aspirin 81 MG tablet Take 81 mg by mouth daily.      . Calcium 600-200 MG-UNIT per tablet Take 1 tablet by mouth daily.      Marland Kitchen. dexlansoprazole (DEXILANT) 60 MG capsule Take 60 mg by mouth daily.      Marland Kitchen. EPINEPHrine (EPIPEN 2-PAK) 0.3 mg/0.3 mL DEVI Inject 0.3 mg into the muscle as needed.       . fluticasone (FLONASE) 50 MCG/ACT nasal spray Place 2 sprays into the nose daily.      Marland Kitchen. levalbuterol (XOPENEX HFA) 45 MCG/ACT inhaler Inhale 1-2 puffs into the lungs every 4 (four) hours as needed for wheezing.      . Multiple Vitamins-Minerals (CENTRUM SILVER PO) Take by mouth daily.      . nitroGLYCERIN (NITROSTAT) 0.4 MG SL tablet Place 1 tablet (0.4 mg total) under the tongue every 5 (  five) minutes as needed for chest pain.  25 tablet  6  . Olmesartan-Amlodipine-HCTZ (TRIBENZOR) 40-10-12.5 MG TABS Take by mouth daily.      . rosuvastatin (CRESTOR) 5 MG tablet Take 1/2 to 1 tablet daily.       No current facility-administered medications for this visit.    Review of Systems Review of Systems  Constitutional: Negative.   Respiratory: Negative.   Cardiovascular: Negative.     Blood pressure 118/80, pulse 86, resp. rate 16, height 5\' 3"  (1.6 m), weight 181 lb (82.101 kg).  Physical Exam Physical Exam  Constitutional: She is oriented to person, place, and time. She appears well-developed and well-nourished.  Eyes: Conjunctivae are normal. No scleral icterus.  Neck: Neck supple. No thyromegaly present.  Pulmonary/Chest: Right breast exhibits no inverted nipple, no mass, no nipple discharge, no skin change and no tenderness. Left breast exhibits tenderness (mild tenderness throughout.  ). Left breast exhibits no inverted nipple, no mass, no nipple discharge and no skin change.    Lymphadenopathy:    She has no cervical adenopathy.    She has no axillary adenopathy.  Neurological: She is alert and oriented to person, place, and time.  Skin: Skin is warm and dry.    Data Reviewed None  Assessment    No findings to account for her pain. Advised a trial of AI-aleve or Advil daily for about 10 days. Will recheck in 2 weeks.    Plan    Patient to return in 2 weeks for follow up.        SANKAR,SEEPLAPUTHUR G 04/27/2014, 5:33 AM

## 2014-04-27 ENCOUNTER — Encounter: Payer: Self-pay | Admitting: General Surgery

## 2014-05-01 ENCOUNTER — Ambulatory Visit (INDEPENDENT_AMBULATORY_CARE_PROVIDER_SITE_OTHER): Payer: Medicare Other | Admitting: Cardiovascular Disease

## 2014-05-01 ENCOUNTER — Encounter: Payer: Self-pay | Admitting: Cardiovascular Disease

## 2014-05-01 VITALS — BP 130/62 | HR 53 | Ht 63.0 in | Wt 182.5 lb

## 2014-05-01 DIAGNOSIS — I2584 Coronary atherosclerosis due to calcified coronary lesion: Secondary | ICD-10-CM

## 2014-05-01 DIAGNOSIS — R0789 Other chest pain: Secondary | ICD-10-CM

## 2014-05-01 DIAGNOSIS — I10 Essential (primary) hypertension: Secondary | ICD-10-CM

## 2014-05-01 DIAGNOSIS — I2510 Atherosclerotic heart disease of native coronary artery without angina pectoris: Secondary | ICD-10-CM

## 2014-05-01 DIAGNOSIS — E785 Hyperlipidemia, unspecified: Secondary | ICD-10-CM

## 2014-05-01 NOTE — Assessment & Plan Note (Signed)
Currently with no symptoms of angina. No further workup at this time. Continue current medication regimen. 

## 2014-05-01 NOTE — Progress Notes (Signed)
Patient ID: Jodi HellingRosely J Kirby, female    DOB: 11-Jan-1946, 68 y.o.   MRN: [id number]  HPI Comments: Jodi Kirby is a very pleasant 68 year old woman with a history of coronary artery arterial disease on catheterization in 2003 with 20% disease in her RCA and LAD, history of hyperlipidemia, back surgery, obstructive sleep apnea on CPAP, patient of Dr. Carlynn Kirby who previously presented for symptoms of chest pressure. She presents for routine followup  In followup today, she reports that she is doing well. She has occasional knee pain, is sedentary in general with no regular exercise program. She is retired. Denies having any significant chest pain or shortness of breath with exertion. She does have chronic back, knee, ankle pain. This limits her ability to exercise Stress test in 2014 showing no ischemia  She is tolerating Crestor 2.5 mg daily. Side effects on other statins including muscle cramps Lab work April 2014 showing total cholesterol 162, LDL 76, HDL 58 Lab work done April 2013 showing total cholesterol 165, LDL 72  EKG shows sinus bradycardia with rate 53 beats per minute with no significant ST or T wave changes   Outpatient Encounter Prescriptions as of 05/01/2014  Medication Sig  . acetaminophen-codeine (TYLENOL #3) 300-30 MG per tablet Take 1 tablet by mouth daily.  . Alendronate Sodium (FOSAMAX PO) Take 1 tablet by mouth once a week.  Marland Kitchen. aspirin 81 MG tablet Take 81 mg by mouth daily.  . Calcium 600-200 MG-UNIT per tablet Take 1 tablet by mouth daily.  Marland Kitchen. dexlansoprazole (DEXILANT) 60 MG capsule Take 60 mg by mouth daily.  Marland Kitchen. EPINEPHrine (EPIPEN 2-PAK) 0.3 mg/0.3 mL DEVI Inject 0.3 mg into the muscle as needed.   . fluticasone (FLONASE) 50 MCG/ACT nasal spray Place 2 sprays into the nose daily.  Marland Kitchen. levalbuterol (XOPENEX HFA) 45 MCG/ACT inhaler Inhale 1-2 puffs into the lungs every 4 (four) hours as needed for wheezing.  . Multiple Vitamins-Minerals (CENTRUM SILVER PO) Take by mouth  daily.  . nitroGLYCERIN (NITROSTAT) 0.4 MG SL tablet Place 1 tablet (0.4 mg total) under the tongue every 5 (five) minutes as needed for chest pain.  . Olmesartan-Amlodipine-HCTZ (TRIBENZOR) 40-10-12.5 MG TABS Take by mouth daily.  . rosuvastatin (CRESTOR) 5 MG tablet Take 1/2 to 1 tablet daily.    Review of Systems  Constitutional: Negative.   HENT: Negative.   Eyes: Negative.   Respiratory: Negative.   Cardiovascular: Negative.   Gastrointestinal: Negative.   Endocrine: Negative.   Musculoskeletal: Negative.   Skin: Negative.   Allergic/Immunologic: Negative.   Neurological: Negative.   Hematological: Negative.   Psychiatric/Behavioral: Negative.   All other systems reviewed and are negative.   BP 130/62  Pulse 53  Ht 5\' 3"  (1.6 m)  Wt 182 lb 8 oz (82.781 kg)  BMI 32.34 kg/m2  Physical Exam  Nursing note and vitals reviewed. Constitutional: She is oriented to person, place, and time. She appears well-developed and well-nourished.  HENT:  Head: Normocephalic.  Nose: Nose normal.  Mouth/Throat: Oropharynx is clear and moist.  Eyes: Conjunctivae are normal. Pupils are equal, round, and reactive to light.  Neck: Normal range of motion. Neck supple. No JVD present.  Cardiovascular: Normal rate, regular rhythm, S1 normal, S2 normal, normal heart sounds and intact distal pulses.  Exam reveals no gallop and no friction rub.   No murmur heard. Pulmonary/Chest: Effort normal and breath sounds normal. No respiratory distress. She has no wheezes. She has no rales. She exhibits no tenderness.  Abdominal: Soft.  Bowel sounds are normal. She exhibits no distension. There is no tenderness.  Musculoskeletal: Normal range of motion. She exhibits no edema and no tenderness.  Lymphadenopathy:    She has no cervical adenopathy.  Neurological: She is alert and oriented to person, place, and time. Coordination normal.  Skin: Skin is warm and dry. No rash noted. No erythema.  Psychiatric: She  has a normal mood and affect. Her behavior is normal. Judgment and thought content normal.    Assessment and Plan

## 2014-05-01 NOTE — Patient Instructions (Signed)
You are doing well. No medication changes were made.  Please call us if you have new issues that need to be addressed before your next appt.  Your physician wants you to follow-up in: 12 months.  You will receive a reminder letter in the mail two months in advance. If you don't receive a letter, please call our office to schedule the follow-up appointment. 

## 2014-05-01 NOTE — Assessment & Plan Note (Signed)
No further chest discomfort. Prior stress test in 2014 showing no ischemia

## 2014-05-01 NOTE — Assessment & Plan Note (Signed)
Blood pressure is well controlled on today's visit. No changes made to the medications. 

## 2014-05-01 NOTE — Assessment & Plan Note (Signed)
Cholesterol is at goal on the current lipid regimen. No changes to the medications were made.  

## 2014-05-10 ENCOUNTER — Ambulatory Visit: Payer: Medicare Other | Admitting: General Surgery

## 2014-06-12 DIAGNOSIS — M1711 Unilateral primary osteoarthritis, right knee: Secondary | ICD-10-CM | POA: Insufficient documentation

## 2014-06-12 DIAGNOSIS — M1710 Unilateral primary osteoarthritis, unspecified knee: Secondary | ICD-10-CM

## 2014-06-19 ENCOUNTER — Ambulatory Visit: Payer: Self-pay | Admitting: Unknown Physician Specialty

## 2014-09-03 ENCOUNTER — Encounter: Payer: Self-pay | Admitting: Cardiovascular Disease

## 2014-09-24 ENCOUNTER — Ambulatory Visit (INDEPENDENT_AMBULATORY_CARE_PROVIDER_SITE_OTHER): Payer: Medicare Other | Admitting: General Surgery

## 2014-09-24 ENCOUNTER — Encounter: Payer: Self-pay | Admitting: General Surgery

## 2014-09-24 VITALS — BP 120/70 | HR 74 | Resp 12 | Ht 63.0 in | Wt 182.0 lb

## 2014-09-24 DIAGNOSIS — N63 Unspecified lump in breast: Secondary | ICD-10-CM

## 2014-09-24 DIAGNOSIS — N6019 Diffuse cystic mastopathy of unspecified breast: Secondary | ICD-10-CM | POA: Insufficient documentation

## 2014-09-24 DIAGNOSIS — N6320 Unspecified lump in the left breast, unspecified quadrant: Secondary | ICD-10-CM

## 2014-09-24 DIAGNOSIS — I2584 Coronary atherosclerosis due to calcified coronary lesion: Secondary | ICD-10-CM

## 2014-09-24 DIAGNOSIS — Z86010 Personal history of colonic polyps: Secondary | ICD-10-CM | POA: Insufficient documentation

## 2014-09-24 DIAGNOSIS — I2510 Atherosclerotic heart disease of native coronary artery without angina pectoris: Secondary | ICD-10-CM

## 2014-09-24 NOTE — Progress Notes (Signed)
Patient ID: Jodi HellingRosely J Kirby, female   DOB: July 29, 1946, 68 y.o.   MRN: [id number]  Chief Complaint  Patient presents with  . Follow-up    mammogram    HPI Jodi Kirby is a 68 y.o. female who presents for a breast evaluation. The most [facility]  Marland [facility]. Patient does perform regular self breast checks and gets regular mammograms done.  No new complaints at this time. Had recent colonoscopy-one polyp removed  HPI  Past Medical History  Diagnosis Date  . Hyperlipidemia   . Coronary artery disease   . Obstructive sleep apnea   . Esophageal reflux   . Essential hypertension, benign   . Dysmetabolic syndrome X   . Other abnormal glucose   . Toxic effect of venom(989.5)   . Allergic rhinitis, cause unspecified   . Unspecified asthma(493.90)   . Lumbago   . Osteoarthrosis, unspecified whether generalized or localized, lower leg   . Lump or mass in breast   . Dysuria   . 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 Unspecified iridocyclitis   . Unspecified vitamin D deficiency   . Other and unspecified angina pectoris   . Myalgia and myositis, unspecified   . MI (myocardial infarction)     Past Surgical History  Procedure Laterality Date  . Knee surgery  08/18/2011    arthroscopic right  . Lumbar fusion      L4-5, S-1  . Cystostomy    . Cardiac catheterization  2003    [facility]  . Cardiac catheterization      [facility]  . Neck surgery  2003  . Ankle surgery Right 1980  . Colonoscopy  2008, 2015    Dr. Servando SnareWohl  . Tubal ligation    . Abdominal hysterectomy  1970  . Bladder surgery  2012    Family History  Problem Relation Age of Onset  . Family history unknown: Yes    Social History History  Substance Use Topics  . Smoking status: Never Smoker   . Smokeless tobacco: Never Used  . Alcohol Use: No    Allergies  Allergen Reactions  . Niaspan [Niacin Er]   . Oxycodone-Acetaminophen  Nausea Only  . Statins     Current Outpatient Prescriptions  Medication Sig Dispense Refill  . acetaminophen-codeine (TYLENOL #3) 300-30 MG per tablet Take 1 tablet by mouth daily.    . Alendronate Sodium (FOSAMAX PO) Take 1 tablet by mouth once a week.    Marland Kitchen. aspirin 81 MG tablet Take 81 mg by mouth daily.    Marland Kitchen. dexlansoprazole (DEXILANT) 60 MG capsule Take 60 mg by mouth daily.    Marland Kitchen. EPINEPHrine (EPIPEN 2-PAK) 0.3 mg/0.3 mL DEVI Inject 0.3 mg into the muscle as needed.     . fluticasone (FLONASE) 50 MCG/ACT nasal spray Place 2 sprays into the nose daily.    Marland Kitchen. levalbuterol (XOPENEX HFA) 45 MCG/ACT inhaler Inhale 1-2 puffs into the lungs every 4 (four) hours as needed for wheezing.    . Multiple Vitamins-Minerals (CENTRUM SILVER PO) Take by mouth daily.    . nitroGLYCERIN (NITROSTAT) 0.4 MG SL tablet Place 1 tablet (0.4 mg total) under the tongue every 5 (five) minutes as needed for chest pain. 25 tablet 6  . Olmesartan-Amlodipine-HCTZ (TRIBENZOR) 40-10-12.5 MG TABS Take by mouth daily.    . rosuvastatin (CRESTOR) 5 MG tablet Take 1/2 to 1 tablet daily.     No current  facility-administered medications for this visit.    Review of Systems Review of Systems  Constitutional: Negative.   Respiratory: Negative.   Cardiovascular: Negative.     Blood pressure 120/70, pulse 74, resp. rate 12, height 5\' 3"  (1.6 m), weight 182 lb (82.555 kg).  Physical Exam Physical Exam  Constitutional: She is oriented to person, place, and time. She appears well-developed and well-nourished.  Eyes: Conjunctivae are normal. No scleral icterus.  Neck: Neck supple. No thyromegaly present.  Cardiovascular: Normal rate, regular rhythm and normal heart sounds.   No murmur heard. Pulmonary/Chest: Effort normal and breath sounds normal. Right breast exhibits no inverted nipple, no mass, no nipple discharge, no skin change and no tenderness. Left breast exhibits no inverted nipple, no mass, no nipple discharge, no  skin change and no tenderness.  Abdominal: Soft. Normal appearance and bowel sounds are normal. There is no hepatosplenomegaly. There is no tenderness. No hernia.  Lymphadenopathy:    She has no cervical adenopathy.    She has no axillary adenopathy.  Neurological: She is alert and oriented to person, place, and time.  Skin: Skin is warm and dry.    Data Reviewed  Mammogram reviewed and stable.   Assessment    Stable exam. History of prior breast mass, FCD. History of colon polyp    Plan    Patient to return in 1 year with bilateral screening mammogram.        SANKAR,SEEPLAPUTHUR G 09/24/2014, 9:07 AM

## 2014-09-24 NOTE — Patient Instructions (Signed)
Patient to return in 1 year with bilateral screening mammogram. Continue self breast exams. Call office for any new breast issues or concerns.  

## 2014-09-26 ENCOUNTER — Encounter: Payer: Self-pay | Admitting: General Surgery

## 2014-11-06 DIAGNOSIS — M7551 Bursitis of right shoulder: Secondary | ICD-10-CM | POA: Diagnosis not present

## 2014-11-06 DIAGNOSIS — M5412 Radiculopathy, cervical region: Secondary | ICD-10-CM | POA: Diagnosis not present

## 2014-11-27 DIAGNOSIS — S139XXD Sprain of joints and ligaments of unspecified parts of neck, subsequent encounter: Secondary | ICD-10-CM | POA: Diagnosis not present

## 2015-01-08 DIAGNOSIS — S139XXD Sprain of joints and ligaments of unspecified parts of neck, subsequent encounter: Secondary | ICD-10-CM | POA: Diagnosis not present

## 2015-02-04 ENCOUNTER — Ambulatory Visit
Admit: 2015-02-04 | Disposition: A | Discharge status: Home / Self Care | Payer: Self-pay | Attending: Family Medicine | Admitting: Family Medicine

## 2015-02-23 ENCOUNTER — Ambulatory Visit
Admit: 2015-02-23 | Disposition: A | Discharge status: Home / Self Care | Payer: Self-pay | Attending: Unknown Physician Specialty | Admitting: Unknown Physician Specialty

## 2015-03-20 DIAGNOSIS — G4733 Obstructive sleep apnea (adult) (pediatric): Secondary | ICD-10-CM | POA: Diagnosis not present

## 2015-03-21 ENCOUNTER — Ambulatory Visit
Admission: RE | Admit: 2015-03-21 | Discharge: 2015-03-21 | Disposition: A | Discharge status: Home / Self Care | Payer: Medicare PPO | Source: Ambulatory Visit | Attending: Family Medicine | Admitting: Family Medicine

## 2015-03-21 ENCOUNTER — Other Ambulatory Visit: Payer: Self-pay | Admitting: Family Medicine

## 2015-03-21 DIAGNOSIS — Z01818 Encounter for other preprocedural examination: Secondary | ICD-10-CM | POA: Diagnosis not present

## 2015-04-18 ENCOUNTER — Encounter
Admission: RE | Admit: 2015-04-18 | Discharge: 2015-04-18 | Disposition: A | Discharge status: Home / Self Care | Payer: Medicare PPO | Source: Ambulatory Visit | Attending: Unknown Physician Specialty | Admitting: Unknown Physician Specialty

## 2015-04-18 DIAGNOSIS — I10 Essential (primary) hypertension: Secondary | ICD-10-CM | POA: Diagnosis present

## 2015-04-18 DIAGNOSIS — I252 Old myocardial infarction: Secondary | ICD-10-CM | POA: Diagnosis not present

## 2015-04-18 DIAGNOSIS — M1711 Unilateral primary osteoarthritis, right knee: Secondary | ICD-10-CM | POA: Diagnosis present

## 2015-04-18 DIAGNOSIS — M6281 Muscle weakness (generalized): Secondary | ICD-10-CM | POA: Diagnosis not present

## 2015-04-18 DIAGNOSIS — Z79899 Other long term (current) drug therapy: Secondary | ICD-10-CM | POA: Diagnosis not present

## 2015-04-18 DIAGNOSIS — Z888 Allergy status to other drugs, medicaments and biological substances status: Secondary | ICD-10-CM | POA: Diagnosis not present

## 2015-04-18 DIAGNOSIS — M179 Osteoarthritis of knee, unspecified: Secondary | ICD-10-CM | POA: Diagnosis present

## 2015-04-18 DIAGNOSIS — Z7982 Long term (current) use of aspirin: Secondary | ICD-10-CM | POA: Diagnosis not present

## 2015-04-18 DIAGNOSIS — E785 Hyperlipidemia, unspecified: Secondary | ICD-10-CM | POA: Diagnosis present

## 2015-04-18 DIAGNOSIS — J45909 Unspecified asthma, uncomplicated: Secondary | ICD-10-CM | POA: Diagnosis present

## 2015-04-18 DIAGNOSIS — Z471 Aftercare following joint replacement surgery: Secondary | ICD-10-CM | POA: Diagnosis not present

## 2015-04-18 DIAGNOSIS — Z01810 Encounter for preprocedural cardiovascular examination: Secondary | ICD-10-CM | POA: Insufficient documentation

## 2015-04-18 DIAGNOSIS — G4733 Obstructive sleep apnea (adult) (pediatric): Secondary | ICD-10-CM | POA: Diagnosis present

## 2015-04-18 DIAGNOSIS — Z09 Encounter for follow-up examination after completed treatment for conditions other than malignant neoplasm: Secondary | ICD-10-CM | POA: Diagnosis not present

## 2015-04-18 DIAGNOSIS — Z01812 Encounter for preprocedural laboratory examination: Secondary | ICD-10-CM | POA: Diagnosis present

## 2015-04-18 DIAGNOSIS — Z885 Allergy status to narcotic agent status: Secondary | ICD-10-CM | POA: Diagnosis not present

## 2015-04-18 DIAGNOSIS — Z96651 Presence of right artificial knee joint: Secondary | ICD-10-CM | POA: Diagnosis not present

## 2015-04-18 DIAGNOSIS — I2510 Atherosclerotic heart disease of native coronary artery without angina pectoris: Secondary | ICD-10-CM | POA: Diagnosis present

## 2015-04-18 DIAGNOSIS — M609 Myositis, unspecified: Secondary | ICD-10-CM | POA: Diagnosis present

## 2015-04-18 DIAGNOSIS — M4647 Discitis, unspecified, lumbosacral region: Secondary | ICD-10-CM | POA: Diagnosis not present

## 2015-04-18 DIAGNOSIS — M810 Age-related osteoporosis without current pathological fracture: Secondary | ICD-10-CM | POA: Diagnosis present

## 2015-04-18 DIAGNOSIS — E8881 Metabolic syndrome: Secondary | ICD-10-CM | POA: Diagnosis present

## 2015-04-18 DIAGNOSIS — K219 Gastro-esophageal reflux disease without esophagitis: Secondary | ICD-10-CM | POA: Diagnosis present

## 2015-04-18 LAB — URINALYSIS COMPLETE WITH MICROSCOPIC (ARMC ONLY)
BACTERIA UA: NONE SEEN
Bilirubin Urine: NEGATIVE
GLUCOSE, UA: NEGATIVE mg/dL
HGB URINE DIPSTICK: NEGATIVE
Ketones, ur: NEGATIVE mg/dL
Leukocytes, UA: NEGATIVE
NITRITE: NEGATIVE
Protein, ur: NEGATIVE mg/dL
RBC / HPF: NONE SEEN RBC/hpf (ref 0–5)
Specific Gravity, Urine: 1.012 (ref 1.005–1.030)
pH: 6 (ref 5.0–8.0)

## 2015-04-18 LAB — SURGICAL PCR SCREEN
MRSA, PCR: NEGATIVE
STAPHYLOCOCCUS AUREUS: NEGATIVE

## 2015-04-18 LAB — ABO/RH: ABO/RH(D): O POS

## 2015-04-18 LAB — PROTIME-INR
INR: 0.94
PROTHROMBIN TIME: 12.8 s (ref 11.4–15.0)

## 2015-04-18 LAB — POTASSIUM: POTASSIUM: 3.3 mmol/L — AB (ref 3.5–5.1)

## 2015-04-18 LAB — APTT: aPTT: 30 seconds (ref 24–36)

## 2015-04-18 LAB — PREPARE RBC (CROSSMATCH)

## 2015-04-18 NOTE — Patient Instructions (Signed)
  Your procedure is scheduled on: Monday 6/27 Report to Day Surgery. Medical Valinda Hoar To find out your arrival time please call [phone] between 1PM - 3PM on Friday 6/24.  Remember: Instructions that are not followed completely may result in serious medical risk, up to and including death, or upon the discretion of your surgeon and anesthesiologist your surgery may need to be rescheduled.    __x__ 1. Do not eat food or drink liquids after midnight. No gum chewing or hard candies.     __x__ 2. No Alcohol for 24 hours before or after surgery.   ____ 3. Bring all medications with you on the day of surgery if instructed.    __x__ 4. Notify your doctor if there is any change in your medical condition     (cold, fever, infections).     Do not wear jewelry, make-up, hairpins, clips or nail polish.  Do not wear lotions, powders, or perfumes.   Do not shave 48 hours prior to surgery. Men may shave face and neck.  Do not bring valuables to the hospital.    [facility] is not responsible for any belongings or valuables.               Contacts, dentures or bridgework may not be worn into surgery.  Leave your suitcase in the car. After surgery it may be brought to your room.  For patients admitted to the hospital, discharge time is determined by your                treatment team.   Patients discharged the day of surgery will not be allowed to drive home.   Please read over the following fact sheets that you were given:   MRSA Information and Surgical Site Infection Prevention   __x__ Take these medicines the morning of surgery with A SIP OF WATER:    1. rosuvastatin  2. omeprazole  3. Use your ventolin inhaler  4.  5.  6.  ____ Fleet Enema (as directed)   __x__ Use CHG Soap as directed  __x__ Use inhalers on the day of surgery  ____ Stop metformin 2 days prior to surgery    ____ Take 1/2 of usual insulin dose the night before surgery and none on the morning of surgery.    __x__ Stop Coumadin/Plavix/aspirin on 6/22 5 days before surgery per Dr. Carlynn Purl  ____ Stop Anti-inflammatories on    ____ Stop supplements until after surgery.    __x__ Bring C-Pap to the hospital.

## 2015-04-18 NOTE — OR Nursing (Signed)
Patient stated she had just had lab work done recently and did not want to repeat if possible.

## 2015-04-20 DIAGNOSIS — M1711 Unilateral primary osteoarthritis, right knee: Secondary | ICD-10-CM | POA: Diagnosis not present

## 2015-04-22 NOTE — OR Nursing (Signed)
Potassium 3.3 on 04/18/15 was faxed and called to surgeons office. Recheck am surgery

## 2015-04-25 ENCOUNTER — Encounter: Payer: Self-pay | Admitting: Family Medicine

## 2015-04-26 ENCOUNTER — Ambulatory Visit: Payer: BC Managed Care – PPO | Admitting: Cardiovascular Disease

## 2015-04-29 ENCOUNTER — Encounter: Payer: Self-pay | Admitting: Anesthesiology

## 2015-04-29 ENCOUNTER — Inpatient Hospital Stay: Payer: Medicare PPO

## 2015-04-29 ENCOUNTER — Inpatient Hospital Stay
Admission: RE | Admit: 2015-04-29 | Discharge: 2015-05-02 | DRG: 470 | Disposition: A | Discharge status: Home Health | Payer: Medicare PPO | Source: Ambulatory Visit | Attending: Unknown Physician Specialty | Admitting: Unknown Physician Specialty

## 2015-04-29 ENCOUNTER — Inpatient Hospital Stay: Payer: Medicare PPO | Admitting: Anesthesiology

## 2015-04-29 ENCOUNTER — Encounter: Payer: Self-pay | Admitting: Family Medicine

## 2015-04-29 ENCOUNTER — Encounter
Admission: RE | Disposition: A | Discharge status: Home Health | Payer: Self-pay | Source: Ambulatory Visit | Attending: Unknown Physician Specialty

## 2015-04-29 DIAGNOSIS — M1711 Unilateral primary osteoarthritis, right knee: Secondary | ICD-10-CM | POA: Diagnosis present

## 2015-04-29 DIAGNOSIS — J45909 Unspecified asthma, uncomplicated: Secondary | ICD-10-CM | POA: Diagnosis present

## 2015-04-29 DIAGNOSIS — Z888 Allergy status to other drugs, medicaments and biological substances status: Secondary | ICD-10-CM | POA: Diagnosis not present

## 2015-04-29 DIAGNOSIS — M179 Osteoarthritis of knee, unspecified: Secondary | ICD-10-CM | POA: Diagnosis present

## 2015-04-29 DIAGNOSIS — M810 Age-related osteoporosis without current pathological fracture: Secondary | ICD-10-CM | POA: Diagnosis present

## 2015-04-29 DIAGNOSIS — M609 Myositis, unspecified: Secondary | ICD-10-CM | POA: Diagnosis present

## 2015-04-29 DIAGNOSIS — E8881 Metabolic syndrome: Secondary | ICD-10-CM | POA: Diagnosis present

## 2015-04-29 DIAGNOSIS — Z79899 Other long term (current) drug therapy: Secondary | ICD-10-CM | POA: Diagnosis not present

## 2015-04-29 DIAGNOSIS — G4733 Obstructive sleep apnea (adult) (pediatric): Secondary | ICD-10-CM | POA: Diagnosis present

## 2015-04-29 DIAGNOSIS — I2510 Atherosclerotic heart disease of native coronary artery without angina pectoris: Secondary | ICD-10-CM | POA: Diagnosis present

## 2015-04-29 DIAGNOSIS — G8918 Other acute postprocedural pain: Secondary | ICD-10-CM

## 2015-04-29 DIAGNOSIS — I252 Old myocardial infarction: Secondary | ICD-10-CM

## 2015-04-29 DIAGNOSIS — Z09 Encounter for follow-up examination after completed treatment for conditions other than malignant neoplasm: Secondary | ICD-10-CM

## 2015-04-29 DIAGNOSIS — I10 Essential (primary) hypertension: Secondary | ICD-10-CM | POA: Diagnosis present

## 2015-04-29 DIAGNOSIS — Z7982 Long term (current) use of aspirin: Secondary | ICD-10-CM | POA: Diagnosis not present

## 2015-04-29 DIAGNOSIS — E785 Hyperlipidemia, unspecified: Secondary | ICD-10-CM | POA: Diagnosis present

## 2015-04-29 DIAGNOSIS — Z96651 Presence of right artificial knee joint: Secondary | ICD-10-CM | POA: Insufficient documentation

## 2015-04-29 DIAGNOSIS — Z885 Allergy status to narcotic agent status: Secondary | ICD-10-CM

## 2015-04-29 DIAGNOSIS — K219 Gastro-esophageal reflux disease without esophagitis: Secondary | ICD-10-CM | POA: Diagnosis present

## 2015-04-29 DIAGNOSIS — Z9889 Other specified postprocedural states: Secondary | ICD-10-CM

## 2015-04-29 HISTORY — PX: TOTAL KNEE ARTHROPLASTY: SHX125

## 2015-04-29 LAB — CBC
HEMATOCRIT: 35.5 % (ref 35.0–47.0)
Hemoglobin: 11.2 g/dL — ABNORMAL LOW (ref 12.0–16.0)
MCH: 27.1 pg (ref 26.0–34.0)
MCHC: 31.6 g/dL — ABNORMAL LOW (ref 32.0–36.0)
MCV: 85.7 fL (ref 80.0–100.0)
PLATELETS: 250 10*3/uL (ref 150–440)
RBC: 4.14 MIL/uL (ref 3.80–5.20)
RDW: 12.9 % (ref 11.5–14.5)
WBC: 17.1 10*3/uL — AB (ref 3.6–11.0)

## 2015-04-29 LAB — POTASSIUM
Potassium, serum: 3.8
Potassium: 4.1 mmol/L (ref 3.5–5.1)

## 2015-04-29 LAB — CREATININE, SERUM
Creatinine, Ser: 1.04 mg/dL — ABNORMAL HIGH (ref 0.44–1.00)
GFR calc Af Amer: >60 mL/min (ref >60)
GFR calc non Af Amer: 54 mL/min — ABNORMAL LOW (ref >60)

## 2015-04-29 SURGERY — ARTHROPLASTY, KNEE, TOTAL
Anesthesia: General | Site: Knee | Laterality: Right | Wound class: Clean

## 2015-04-29 MED ORDER — VALSARTAN 160 MG PO TABS
160.0000 mg | ORAL_TABLET | Freq: Every day | ORAL | Status: DC
  Administered 2015-04-30 – 2015-05-02 (×3): 160 mg via ORAL
  Filled 2015-04-29 (×3): qty 1

## 2015-04-29 MED ORDER — AMLODIPINE-VALSARTAN-HCTZ 5-160-25 MG PO TABS: 1.0000 | ORAL_TABLET | Freq: Every day | ORAL | Status: DC

## 2015-04-29 MED ORDER — BUPIVACAINE-EPINEPHRINE (PF) 0.5% -1:200000 IJ SOLN
INTRAMUSCULAR | Status: AC
  Filled 2015-04-29: qty 30

## 2015-04-29 MED ORDER — TRANEXAMIC ACID 1000 MG/10ML IV SOLN
INTRAVENOUS | Status: AC
  Filled 2015-04-29: qty 10

## 2015-04-29 MED ORDER — FLUTICASONE PROPIONATE 50 MCG/ACT NA SUSP
2.0000 | Freq: Every day | NASAL | Status: DC
  Administered 2015-04-29 – 2015-05-01 (×2): 2 via NASAL
  Filled 2015-04-29: qty 16

## 2015-04-29 MED ORDER — NITROGLYCERIN 0.4 MG SL SUBL: 0.4000 mg | SUBLINGUAL_TABLET | SUBLINGUAL | Status: DC | PRN

## 2015-04-29 MED ORDER — HYDROMORPHONE HCL 1 MG/ML IJ SOLN
1.0000 mg | INTRAMUSCULAR | Status: DC | PRN
  Administered 2015-04-29 – 2015-04-30 (×4): 1 mg via INTRAVENOUS
  Filled 2015-04-29 (×4): qty 1

## 2015-04-29 MED ORDER — CEFAZOLIN SODIUM 1-5 GM-% IV SOLN
1.0000 g | Freq: Three times a day (TID) | INTRAVENOUS | Status: AC
  Administered 2015-04-29 – 2015-04-30 (×3): 1 g via INTRAVENOUS
  Filled 2015-04-29 (×3): qty 50

## 2015-04-29 MED ORDER — ONDANSETRON HCL 4 MG/2ML IJ SOLN: 4.0000 mg | Freq: Once | INTRAMUSCULAR | Status: DC | PRN

## 2015-04-29 MED ORDER — ALBUTEROL SULFATE HFA 108 (90 BASE) MCG/ACT IN AERS: 2.0000 | INHALATION_SPRAY | Freq: Four times a day (QID) | RESPIRATORY_TRACT | Status: DC | PRN

## 2015-04-29 MED ORDER — ACETAMINOPHEN 10 MG/ML IV SOLN
INTRAVENOUS | Status: DC | PRN
  Administered 2015-04-29: 1000 mg via INTRAVENOUS

## 2015-04-29 MED ORDER — ALBUTEROL SULFATE (2.5 MG/3ML) 0.083% IN NEBU: 2.5000 mg | INHALATION_SOLUTION | Freq: Four times a day (QID) | RESPIRATORY_TRACT | Status: DC | PRN

## 2015-04-29 MED ORDER — CEFAZOLIN SODIUM 1-5 GM-% IV SOLN
INTRAVENOUS | Status: AC
  Filled 2015-04-29: qty 50

## 2015-04-29 MED ORDER — POLYETHYLENE GLYCOL 3350 17 G PO PACK
17.0000 g | PACK | Freq: Every day | ORAL | Status: DC | PRN
  Administered 2015-04-30: 17 g via ORAL
  Filled 2015-04-29: qty 1

## 2015-04-29 MED ORDER — ENOXAPARIN SODIUM 30 MG/0.3ML ~~LOC~~ SOLN
30.0000 mg | Freq: Two times a day (BID) | SUBCUTANEOUS | Status: DC
  Administered 2015-04-30 – 2015-05-02 (×5): 30 mg via SUBCUTANEOUS
  Filled 2015-04-29 (×5): qty 0.3

## 2015-04-29 MED ORDER — PANTOPRAZOLE SODIUM 40 MG PO TBEC
80.0000 mg | DELAYED_RELEASE_TABLET | Freq: Every day | ORAL | Status: DC
  Administered 2015-04-30 – 2015-05-01 (×2): 80 mg via ORAL
  Filled 2015-04-29 (×2): qty 2

## 2015-04-29 MED ORDER — ROCURONIUM BROMIDE 100 MG/10ML IV SOLN
INTRAVENOUS | Status: DC | PRN
  Administered 2015-04-29: 40 mg via INTRAVENOUS

## 2015-04-29 MED ORDER — ONDANSETRON HCL 4 MG/2ML IJ SOLN
4.0000 mg | Freq: Four times a day (QID) | INTRAMUSCULAR | Status: DC | PRN
  Administered 2015-04-29 (×2): 4 mg via INTRAVENOUS
  Filled 2015-04-29 (×2): qty 2

## 2015-04-29 MED ORDER — LACTATED RINGERS IV SOLN
INTRAVENOUS | Status: DC
  Administered 2015-04-29: 07:00:00 via INTRAVENOUS

## 2015-04-29 MED ORDER — FENTANYL CITRATE (PF) 100 MCG/2ML IJ SOLN
25.0000 ug | INTRAMUSCULAR | Status: AC | PRN
  Administered 2015-04-29 (×6): 25 ug via INTRAVENOUS

## 2015-04-29 MED ORDER — VITAMIN D 1000 UNITS PO TABS
1000.0000 [IU] | ORAL_TABLET | Freq: Every day | ORAL | Status: DC
  Administered 2015-04-29 – 2015-05-02 (×4): 1000 [IU] via ORAL
  Filled 2015-04-29 (×5): qty 1

## 2015-04-29 MED ORDER — FENTANYL CITRATE (PF) 100 MCG/2ML IJ SOLN
INTRAMUSCULAR | Status: AC
  Administered 2015-04-29: 25 ug via INTRAVENOUS
  Filled 2015-04-29: qty 2

## 2015-04-29 MED ORDER — LIDOCAINE HCL (CARDIAC) 20 MG/ML IV SOLN
INTRAVENOUS | Status: DC | PRN
Start: 2015-04-29 — End: 2015-04-29
  Administered 2015-04-29: 50 mg via INTRAVENOUS

## 2015-04-29 MED ORDER — ONDANSETRON HCL 4 MG/2ML IJ SOLN
INTRAMUSCULAR | Status: DC | PRN
  Administered 2015-04-29: 4 mg via INTRAVENOUS

## 2015-04-29 MED ORDER — MIDAZOLAM HCL 2 MG/2ML IJ SOLN
INTRAMUSCULAR | Status: DC | PRN
Start: 2015-04-29 — End: 2015-04-29
  Administered 2015-04-29: 2 mg via INTRAVENOUS

## 2015-04-29 MED ORDER — OXYCODONE HCL 5 MG PO TABS
5.0000 mg | ORAL_TABLET | ORAL | Status: DC | PRN
  Administered 2015-04-29 – 2015-05-01 (×10): 10 mg via ORAL
  Administered 2015-05-02: 5 mg via ORAL
  Filled 2015-04-29 (×3): qty 2
  Filled 2015-04-29: qty 1
  Filled 2015-04-29 (×7): qty 2

## 2015-04-29 MED ORDER — BUPIVACAINE LIPOSOME 1.3 % IJ SUSP
INTRAMUSCULAR | Status: AC
  Filled 2015-04-29: qty 20

## 2015-04-29 MED ORDER — KCL IN DEXTROSE-NACL 20-5-0.45 MEQ/L-%-% IV SOLN
INTRAVENOUS | Status: DC
  Administered 2015-04-29 – 2015-04-30 (×2): via INTRAVENOUS
  Filled 2015-04-29 (×8): qty 1000

## 2015-04-29 MED ORDER — HYDROCHLOROTHIAZIDE 25 MG PO TABS
25.0000 mg | ORAL_TABLET | Freq: Every day | ORAL | Status: DC
  Administered 2015-04-30 – 2015-05-02 (×3): 25 mg via ORAL
  Filled 2015-04-29 (×3): qty 1

## 2015-04-29 MED ORDER — ACETAMINOPHEN 650 MG RE SUPP: 650.0000 mg | Freq: Four times a day (QID) | RECTAL | Status: DC | PRN

## 2015-04-29 MED ORDER — AMLODIPINE BESYLATE 5 MG PO TABS
5.0000 mg | ORAL_TABLET | Freq: Every day | ORAL | Status: DC
  Administered 2015-04-30 – 2015-05-02 (×3): 5 mg via ORAL
  Filled 2015-04-29 (×3): qty 1

## 2015-04-29 MED ORDER — PROPOFOL 10 MG/ML IV BOLUS
INTRAVENOUS | Status: DC | PRN
  Administered 2015-04-29: 200 ug via INTRAVENOUS
  Administered 2015-04-29: 100 ug via INTRAVENOUS

## 2015-04-29 MED ORDER — DEXAMETHASONE SODIUM PHOSPHATE 4 MG/ML IJ SOLN
INTRAMUSCULAR | Status: DC | PRN
  Administered 2015-04-29 (×2): 10 mg via INTRAVENOUS

## 2015-04-29 MED ORDER — ONDANSETRON HCL 4 MG PO TABS: 4.0000 mg | ORAL_TABLET | Freq: Four times a day (QID) | ORAL | Status: DC | PRN

## 2015-04-29 MED ORDER — SODIUM CHLORIDE 0.9 % IV SOLN
INTRAVENOUS | Status: DC | PRN
  Administered 2015-04-29: 50 mL

## 2015-04-29 MED ORDER — MENTHOL 3 MG MT LOZG: 1.0000 | LOZENGE | OROMUCOSAL | Status: DC | PRN

## 2015-04-29 MED ORDER — BUPIVACAINE-EPINEPHRINE (PF) 0.5% -1:200000 IJ SOLN
INTRAMUSCULAR | Status: DC | PRN
Start: 2015-04-29 — End: 2015-04-29

## 2015-04-29 MED ORDER — FENTANYL CITRATE (PF) 100 MCG/2ML IJ SOLN
INTRAMUSCULAR | Status: DC | PRN
  Administered 2015-04-29: 50 ug via INTRAVENOUS
  Administered 2015-04-29: 100 ug via INTRAVENOUS

## 2015-04-29 MED ORDER — ROSUVASTATIN CALCIUM 5 MG PO TABS
5.0000 mg | ORAL_TABLET | Freq: Every day | ORAL | Status: DC
  Administered 2015-05-01 – 2015-05-02 (×2): 5 mg via ORAL
  Filled 2015-04-29 (×3): qty 1

## 2015-04-29 MED ORDER — NEOMYCIN-POLYMYXIN B GU 40-200000 IR SOLN
Status: DC | PRN
  Administered 2015-04-29: 16 mL

## 2015-04-29 MED ORDER — CEFAZOLIN SODIUM 1-5 GM-% IV SOLN
1.0000 g | Freq: Once | INTRAVENOUS | Status: AC
  Administered 2015-04-29 (×2): 1 g via INTRAVENOUS

## 2015-04-29 MED ORDER — SODIUM CHLORIDE 0.9 % IJ SOLN
INTRAMUSCULAR | Status: AC
  Filled 2015-04-29: qty 50

## 2015-04-29 MED ORDER — PHENOL 1.4 % MT LIQD: 1.0000 | OROMUCOSAL | Status: DC | PRN

## 2015-04-29 MED ORDER — ACETAMINOPHEN 325 MG PO TABS
650.0000 mg | ORAL_TABLET | Freq: Four times a day (QID) | ORAL | Status: DC | PRN
  Filled 2015-04-29: qty 2

## 2015-04-29 MED ORDER — ACETAMINOPHEN 10 MG/ML IV SOLN
INTRAVENOUS | Status: AC
  Filled 2015-04-29: qty 100

## 2015-04-29 SURGICAL SUPPLY — 59 items
AUTOTRANSFUS HAS 1/8 (MISCELLANEOUS) ×3
BLADE SAGITTAL AGGR TOOTH XLG (BLADE) ×3 IMPLANT
BLADE SAW 1/2 (BLADE) ×3 IMPLANT
BLADE SAW SAG 29X58X.64 (BLADE) ×3 IMPLANT
BLADE SURG 15 STRL LF DISP TIS (BLADE) ×1 IMPLANT
BLADE SURG 15 STRL SS (BLADE) ×2
BNDG COHESIVE 6X5 TAN STRL LF (GAUZE/BANDAGES/DRESSINGS) ×3 IMPLANT
BOWL CEMENT MIXING ADV NOZZLE (MISCELLANEOUS) ×3 IMPLANT
CANISTER SUCT 1200ML W/VALVE (MISCELLANEOUS) ×3 IMPLANT
CANISTER SUCT 3000ML (MISCELLANEOUS) ×3 IMPLANT
CAPT KNEE TOTAL 3 ×3 IMPLANT
CATH TRAY 16F METER LATEX (MISCELLANEOUS) ×3 IMPLANT
CEMENT BONE GENTAMICIN (Cement) ×6 IMPLANT
CEMENT BONE GENTAMICIN PWDR (Cement) ×2 IMPLANT
CHLORAPREP W/TINT 26ML (MISCELLANEOUS) ×9 IMPLANT
COOLER POLAR GLACIER W/PUMP (MISCELLANEOUS) ×3 IMPLANT
DRAPE INCISE IOBAN 66X45 STRL (DRAPES) ×3 IMPLANT
DRAPE SHEET LG 3/4 BI-LAMINATE (DRAPES) ×3 IMPLANT
GAUZE PETRO XEROFOAM 1X8 (MISCELLANEOUS) ×3 IMPLANT
GAUZE SPONGE 4X4 12PLY STRL (GAUZE/BANDAGES/DRESSINGS) ×3 IMPLANT
GLOVE BIO SURGEON STRL SZ8 (GLOVE) ×6 IMPLANT
GLOVE BIOGEL M STRL SZ7.5 (GLOVE) ×3 IMPLANT
GLOVE INDICATOR 8.0 STRL GRN (GLOVE) ×3 IMPLANT
GOWN STRL REUS W/ TWL LRG LVL3 (GOWN DISPOSABLE) IMPLANT
GOWN STRL REUS W/TWL LRG LVL3 (GOWN DISPOSABLE)
GOWN STRL REUS W/TWL LRG LVL4 (GOWN DISPOSABLE) ×6 IMPLANT
HANDPIECE SUCTION TUBG SURGILV (MISCELLANEOUS) ×3 IMPLANT
HOOD PEEL AWAY FACE SHEILD DIS (HOOD) ×3 IMPLANT
IMMBOLIZER KNEE 19 BLUE UNIV (SOFTGOODS) ×3 IMPLANT
IRRIGATION STRYKERFLOW (MISCELLANEOUS) ×1 IMPLANT
IRRIGATOR STRYKERFLOW (MISCELLANEOUS) ×3
KIT RM TURNOVER STRD PROC AR (KITS) ×3 IMPLANT
NDL SAFETY 18GX1.5 (NEEDLE) ×3 IMPLANT
NDL SAFETY 22GX1.5 (NEEDLE) ×3 IMPLANT
NEEDLE SPNL 18GX3.5 QUINCKE PK (NEEDLE) ×3 IMPLANT
NEEDLE SPNL 20GX3.5 QUINCKE YW (NEEDLE) ×6 IMPLANT
NS IRRIG 1000ML POUR BTL (IV SOLUTION) ×3 IMPLANT
PACK TOTAL KNEE (MISCELLANEOUS) ×3 IMPLANT
PAD ABD DERMACEA PRESS 5X9 (GAUZE/BANDAGES/DRESSINGS) ×3 IMPLANT
PAD GROUND ADULT SPLIT (MISCELLANEOUS) ×3 IMPLANT
PAD WRAPON POLAR KNEE (MISCELLANEOUS) ×1 IMPLANT
SOL .9 NS 3000ML IRR  AL (IV SOLUTION) ×2
SOL .9 NS 3000ML IRR UROMATIC (IV SOLUTION) ×1 IMPLANT
SOL PREP PVP 2OZ (MISCELLANEOUS) ×3
SOLUTION PREP PVP 2OZ (MISCELLANEOUS) ×1 IMPLANT
STAPLER SKIN PROX 35W (STAPLE) ×3 IMPLANT
SUCTION FRAZIER TIP 10 FR DISP (SUCTIONS) ×3 IMPLANT
SUT ETHIBOND CT1 BRD #0 30IN (SUTURE) ×9 IMPLANT
SUT VIC AB 0 CT1 27 (SUTURE) ×2
SUT VIC AB 0 CT1 27XCR 8 STRN (SUTURE) ×1 IMPLANT
SUT VIC AB 2-0 CT1 27 (SUTURE) ×4
SUT VIC AB 2-0 CT1 TAPERPNT 27 (SUTURE) ×2 IMPLANT
SYR 20CC LL (SYRINGE) ×3 IMPLANT
SYR 30ML LL (SYRINGE) ×3 IMPLANT
SYR 50ML LL SCALE MARK (SYRINGE) ×3 IMPLANT
SYRINGE 10CC LL (SYRINGE) ×3 IMPLANT
SYSTEM AUTOTRANSFUS DUAL TROCR (MISCELLANEOUS) ×1 IMPLANT
WATER STERILE IRR 1000ML POUR (IV SOLUTION) IMPLANT
WRAPON POLAR PAD KNEE (MISCELLANEOUS) ×3

## 2015-04-29 NOTE — Anesthesia Preprocedure Evaluation (Addendum)
Anesthesia Evaluation  Patient identified by MRN, date of birth, ID band Patient awake    Reviewed: Allergy & Precautions, NPO status , Patient's Chart, lab work & pertinent test results  Airway Mallampati: II  TM Distance: >3 FB Neck ROM: Full    Dental  (+) Chipped Increased overbite with some teeth crooked:   Pulmonary asthma , sleep apnea and Continuous Positive Airway Pressure Ventilation ,  breath sounds clear to auscultation  Pulmonary exam normal       Cardiovascular hypertension, + angina with exertion + CAD and + Past MI Normal cardiovascular exam    Neuro/Psych myalgias  Neuromuscular disease    GI/Hepatic Neg liver ROS, GERD-  Medicated and Controlled,  Endo/Other  negative endocrine ROS  Renal/GU negative Renal ROS  negative genitourinary   Musculoskeletal  (+) Arthritis -, Osteoarthritis,    Abdominal Normal abdominal exam  (+)   Peds negative pediatric ROS (+)  Hematology negative hematology ROS (+)   Anesthesia Other Findings   Reproductive/Obstetrics                            Anesthesia Physical Anesthesia Plan  ASA: III  Anesthesia Plan: General   Post-op Pain Management:    Induction: Intravenous  Airway Management Planned:   Additional Equipment:   Intra-op Plan:   Post-operative Plan:   Informed Consent:   Dental advisory given  Plan Discussed with: CRNA and Surgeon  Anesthesia Plan Comments:         Anesthesia Quick Evaluation

## 2015-04-29 NOTE — Transfer of Care (Signed)
Immediate Anesthesia Transfer of Care Note  Patient: Jodi Kirby  Procedure(s) Performed: Procedure(s): TOTAL KNEE ARTHROPLASTY (Right)  Patient Location: PACU  Anesthesia Type:General  Level of Consciousness: Alert, Awake, Oriented  Airway & Oxygen Therapy: Patient Spontanous Breathing  Post-op Assessment: Report given to RN  Post vital signs: Reviewed and stable  Last Vitals:  Filed Vitals:   04/29/15 1118  BP: 146/86  Pulse: 73  Temp: 36.4 C  Resp: 16    Complications: No apparent anesthesia complications

## 2015-04-29 NOTE — H&P (Signed)
  H and P reviewed. No changes. Uploaded at later date. 

## 2015-04-29 NOTE — Anesthesia Procedure Notes (Signed)
Procedure Name: Intubation Date/Time: 04/29/2015 7:58 AM Performed by: Sherron FlemingsHARVEY, Jodi Kirby Pre-anesthesia Checklist: Patient identified, Patient being monitored, Timeout performed, Emergency Drugs available and Suction available Patient Re-evaluated:Patient Re-evaluated prior to inductionOxygen Delivery Method: Circle system utilized Preoxygenation: Pre-oxygenation with 100% oxygen Intubation Type: IV induction Ventilation: Oral airway inserted - appropriate to patient size and Mask ventilation with difficulty Laryngoscope Size: Mac, 3, 2 and Miller Grade View: Grade III Tube type: Oral Tube size: 7.0 mm Number of attempts: 4 Airway Equipment and Method: Stylet,  Video-laryngoscopy,  Bougie stylet,  Oral airway and Rigid stylet Placement Confirmation: ETT inserted through vocal cords under direct vision,  positive ETCO2 and breath sounds checked- equal and bilateral Secured at: 22 cm Tube secured with: Tape Dental Injury: Teeth and Oropharynx as per pre-operative assessment  Difficulty Due To: Difficulty was unanticipated, Difficult Airway- due to reduced neck mobility, Difficult Airway- due to limited oral opening and Difficult Airway- due to anterior larynx Future Recommendations: Recommend- induction with short-acting agent, and alternative techniques readily available

## 2015-04-29 NOTE — Op Note (Signed)
DATE OF SURGERY:  04/29/2015  PATIENT NAME:  Jodi Kirby   DOB: 1946-05-11  MRN: [id number]  PRE-OPERATIVE DIAGNOSIS: Degenerative arthrosis of the right knee, primary  POST-OPERATIVE DIAGNOSIS:  Degenerative arthrosis of the right knee, primary.  PROCEDURE:  Right total knee arthroplasty  SURGEON:  Dr.Marvena Tally Royann Shivers. M.D.  ASSISTANT:    ANESTHESIA: Gen. endotracheal  ESTIMATED BLOOD LOSS: 50 mL  TOURNIQUET TIME: 119 minutes   IMPLANTS UTILIZED: A #4 [employer]ker triathlon PS femoral component, a #4 universal tibial baseplate, a #4 9 mm thick tibial bearing insert, and a 10 mm thick A35 mm patella button  INDICATIONS FOR SURGERY: Jodi Kirby is a 69 y.o. year old female with a long history of progressive knee pain. X-rays demonstrated severe degenerative changes in tricompartmental fashion. She had not seen any significant improvement despite conservative nonsurgical intervention. After discussion of the risks and benefits of surgical intervention, the patient expressed understanding of the risks benefits and agree with plans for total knee arthroplasty.   The risks, benefits, and alternatives were discussed at length including but not limited to the risks of infection, bleeding, nerve injury, stiffness, blood clots, the need for revision surgery, cardiopulmonary complications, among others, and they were willing to proceed.  PROCEDURE:   The patient was taken to the operating room where satisfactory general anesthesia was achieved. A Foley catheter was inserted. The patient was given 2 g of Kefzol IV prior to start of the procedure. A tourniquet was applied to right upper thigh. The right lower extremity was prepped and draped in the usual fashion for a total knee procedure. The right lower extremity was then exsanguinated, and the tourniquet was inflated. A medial parapatella incision was made.. Dissection carried down through the subcutaneous tissue onto his capsule. This  was divided in line with the incision. The knee was then inspected. There was significant erosion of the medial and lateral tibial plateaus. There was also some erosion of the medial and lateral femoral condylar surfaces. There was mild to moderate trochlear groove erosion and mild to moderate retropatellar erosion. I went ahead and drilled a hole in the intercondylar notch. Intramedullary rod was inserted. On the rod was the distal femoral cutting block. It was positioned against the distal femur. It was set for  5 degree valgus cut. The cutting block was fixed to the distal femur with smooth pins and then the intramedullary road was removed. The distal femoral cut was made, removing 10 mm of bone. I then went ahead and removed some medial and lateral compartment meniscal remnants. ACL and PCL were excised. The knee was hyperflexed. External alignment jig was placed on the right leg and set for a cut about 8-9 mm mm below the lateral tibial plateau. The cut was sloped 3 degrees. I went ahead and made this cut without difficulty. I removed the cut bone from the proximal tibial which gave me a flush cut across the tibial surface. I then inserted a  gap spacer with the knee extended, and indeed it was felt that we could easily fit a  9 mm tibial bearing insert into the extension space. The knee was hyperflexed. The femoral sizing component was placed on the distal femur. It was lined up with the epicondylar axis. It was felt that a #4 femoral component was going to be the appropriate size. Holes were made for this component through the sizing guide, and then the sizing guide was removed. The four-in-one cutting block was impacted onto the  distal femur. Anterior and posterior cuts were made along with the anterior and posterior chamfer cuts. I then notched out the intercondylar area with the appropriate jig. I went ahead and sized the proximal tibia for a  #4 base plate. With the femoral and tibial trial components in  place, I was able to insert a 9 mm trial tibial-bearing insert. The knee fully extended and flexed well.. I then  resected 8 mm of bone from retropatellar surface. I made peg holes for the A32 patellar implant. Trial patella was inserted. It tracked well.   The trials were removed. The tibial base plate ws positioned appropriately for the proximal tibial cruciate cut. The proximal tibial cruciate cut ws made, and then a small reamer was used to ream into the proximal tibia. Next, I went ahead and drilled 2 holes through the femoral  component for the pegs on the femoral component.    I then sequentially impacted the components. The #4  tibial base place was impacted on the proximal tibia with antibiotic-impregnated methyl methacrylate. Excess glue ws removed. I then impacted the #4 femoral component onto the distal femur with methyl methacrylate. Again, excess glue was removed. I then inserted a trial 9 mm spacer. The knee was extended.  I went ahead and applied a 10 mm thick asymmetric patella to the retropatellar surface with methyl methacrylate. Once again, excess glue was removed. After the glue had set up I went ahead and removed the  trial tibial spacer and inserted a permanent 9 mm tibial spacer.  The knee came to full extension and flexed well. The knee seemed to be reasonably stable.   Tourniquet was released at this time.  Tourniquet was up for about 119 minutes.  Bleeding was controlled with coagulation cautery. The wound was irrigated with GU irrigant.  I also injected the capsule and subcutaneous tissue with about 60 cc of Exparel and saline mixture . I went ahead and closed the capsule with #2 ethibond sutures , subcu with  2-0 Vicryl, and skin with skin staples. After I closed the capsule I did inject the joint with 10 cc of TXA. Subsequent to the closure, I did go ahead and apply a sterile dressing and 4 TENS pads. Polar Care cooling pad was applied.  The patient was then transferred to a  stretcher bed and takes to the recovery room in satisfactory condition. Blood loss was about 50 cc.      Dr.Jaimie Pippins Royann Shivers. M.D.

## 2015-04-29 NOTE — Care Management Note (Addendum)
Case Management Note  Patient Details  Name: Jodi Kirby MRN: [id number] Date of Birth: 09/25/1946  Subjective/Objective:                  Patient just received from PACU. Met with patient and her husband to discuss discharge planning. Patient would like to return home at discharge. She would like to use [facility]. She has a rolling walker and her husband will bring that in to hospital for evaluation. She uses [facility] [place] for Rx [phone].  Action/Plan:  Referral made to [facility]. [facility] will continue to follow. [facility]. Referral made to [facility]    Expected Discharge Date:                  Expected Discharge Plan:     In-House Referral:     Discharge planning Services  CM Consult  Post Acute Care Choice:    Choice offered to:  Patient, Spouse  DME Arranged:    DME Agency:     HH Arranged:  PT HH Agency:  [facility] Status of Service:     Medicare Important Message Given:    Date Medicare IM Given:    Medicare IM give by:    Date Additional Medicare IM Given:    Additional Medicare Important Message give by:     [place], dates discussed:    Additional Comments:  Lovenox $27.93.  AMarshell Garfinkel RN 04/29/2015, 1:32 PM

## 2015-04-30 LAB — BASIC METABOLIC PANEL
Anion gap: 11 (ref 5–15)
BUN: 12 mg/dL (ref 6–20)
CALCIUM: 8.7 mg/dL — AB (ref 8.9–10.3)
CO2: 25 mmol/L (ref 22–32)
CREATININE: 1.02 mg/dL — AB (ref 0.44–1.00)
Chloride: 101 mmol/L (ref 101–111)
GFR calc Af Amer: >60 mL/min (ref >60)
GFR, EST NON AFRICAN AMERICAN: 55 mL/min — AB (ref >60)
GLUCOSE: 160 mg/dL — AB (ref 65–99)
POTASSIUM: 4.3 mmol/L (ref 3.5–5.1)
Sodium: 137 mmol/L (ref 135–145)

## 2015-04-30 MED ORDER — TRAMADOL HCL 50 MG PO TABS
50.0000 mg | ORAL_TABLET | ORAL | Status: DC | PRN
  Administered 2015-04-30 – 2015-05-01 (×4): 100 mg via ORAL
  Filled 2015-04-30 (×4): qty 2

## 2015-04-30 MED ORDER — BISACODYL 10 MG RE SUPP: 10.0000 mg | Freq: Every day | RECTAL | Status: DC | PRN

## 2015-04-30 MED ORDER — MAGNESIUM HYDROXIDE 400 MG/5ML PO SUSP
30.0000 mL | Freq: Two times a day (BID) | ORAL | Status: DC | PRN
  Administered 2015-04-30: 30 mL via ORAL
  Filled 2015-04-30: qty 30

## 2015-04-30 MED ORDER — METOCLOPRAMIDE HCL 10 MG PO TABS
10.0000 mg | ORAL_TABLET | Freq: Three times a day (TID) | ORAL | Status: DC
  Administered 2015-05-01 – 2015-05-02 (×3): 10 mg via ORAL
  Filled 2015-04-30 (×3): qty 1

## 2015-04-30 MED ORDER — SENNA 8.6 MG PO TABS
1.0000 | ORAL_TABLET | Freq: Two times a day (BID) | ORAL | Status: DC
  Administered 2015-04-30 – 2015-05-02 (×5): 8.6 mg via ORAL
  Filled 2015-04-30 (×5): qty 1

## 2015-04-30 MED ORDER — ALUM & MAG HYDROXIDE-SIMETH 200-200-20 MG/5ML PO SUSP
30.0000 mL | Freq: Two times a day (BID) | ORAL | Status: DC | PRN
  Administered 2015-04-30: 30 mL via ORAL
  Filled 2015-04-30: qty 30

## 2015-04-30 MED ORDER — SENNOSIDES-DOCUSATE SODIUM 8.6-50 MG PO TABS: 1.0000 | ORAL_TABLET | Freq: Every day | ORAL | Status: DC

## 2015-04-30 MED ORDER — ONDANSETRON 4 MG PO TBDP
4.0000 mg | ORAL_TABLET | Freq: Four times a day (QID) | ORAL | Status: DC | PRN
  Administered 2015-04-30: 4 mg via ORAL
  Filled 2015-04-30 (×4): qty 1

## 2015-04-30 NOTE — Anesthesia Postprocedure Evaluation (Signed)
  Anesthesia Post-op Note  Patient: Jodi Kirby  Procedure(s) Performed: Procedure(s): TOTAL KNEE ARTHROPLASTY (Right)  Anesthesia type:General  Patient location: PACU  Post pain: Pain level controlled  Post assessment: Post-op Vital signs reviewed, Patient's Cardiovascular Status Stable, Respiratory Function Stable, Patent Airway and No signs of Nausea or vomiting  Post vital signs: Reviewed and stable  Last Vitals:  Filed Vitals:   04/30/15 0753  BP: 145/60  Pulse: 67  Temp: 37 C  Resp: 18    Level of consciousness: awake, alert  and patient cooperative  Complications: No apparent anesthesia complications

## 2015-04-30 NOTE — Plan of Care (Signed)
Problem: Consults Goal: Diagnosis- Total Joint Replacement Outcome: Progressing Primary Total Knee     

## 2015-04-30 NOTE — Progress Notes (Signed)
Clinical Social Worker (CSW) received SNF consult. PT is recommending home health. RN Case Manager aware of above. Please reconsult if future social work needs arise. CSW signing off.   Izik Bingman Morgan, LCSWA [phone] 

## 2015-04-30 NOTE — Progress Notes (Signed)
[facility] and spoke to Nurse of [facility] about patient not having TENS unit.  The patient was confused when TENS rep. contacted and told the rep. that she received it but had only received the wireless SCD wraps.  [facility] said that was ok to not have TENS unit on, and just to put on SCD wraps when she gets home.

## 2015-04-30 NOTE — Evaluation (Signed)
Physical Therapy Evaluation Patient Details Name: Jodi Kirby MRN: [id number] DOB: 10-03-1946 Today's Date: 04/30/2015   History of Present Illness  This patient is a 69 year old female who came to [facility] for a right TKR.  Clinical Impression  Pt demonstrates good RLE strength and mobility with therapist. She is able to perform transfers and gait with CGA only and rolling walker. Pt will be safe to discharge home with HHPT after her stay at [facility]. Pt will benefit from skilled PT services to address deficits in strength, balance, and mobility in order to return to full function at home.     Follow Up Recommendations Home health PT    Equipment Recommendations  None recommended by PT    Recommendations for Other Services       Precautions / Restrictions Precautions Precautions: Knee Precaution Booklet Issued: Yes (comment) Precaution Comments: towel roll under heel, polar care Restrictions Weight Bearing Restrictions: Yes RLE Weight Bearing: Weight bearing as tolerated      Mobility  Bed Mobility Overal bed mobility: Needs Assistance Bed Mobility: Supine to Sit     Supine to sit: Min assist     General bed mobility comments: For trunk to go from sidelying  Transfers Overall transfer level: Needs assistance Equipment used: Rolling walker (2 wheeled) Transfers: Sit to/from Stand Sit to Stand: Min guard         General transfer comment: Cues for hand placement, otherwise good strength and sequencing noted  Ambulation/Gait Ambulation/Gait assistance: Min guard Ambulation Distance (Feet): 30 Feet Assistive device: Rolling walker (2 wheeled) Gait Pattern/deviations: Decreased weight shift to right;Decreased step length - left;Antalgic   Gait velocity interpretation: <1.8 ft/sec, indicative of risk for recurrent falls General Gait Details: Decreased gait speed and decreased weight acceptance to RLE  Stairs            Wheelchair Mobility    Modified  Rankin (Stroke Patients Only)       Balance Overall balance assessment: Needs assistance Sitting-balance support: No upper extremity supported;Feet supported Sitting balance-Leahy Scale: Good     Standing balance support: Bilateral upper extremity supported Standing balance-Leahy Scale: Poor                               Pertinent Vitals/Pain Pain Assessment: 0-10 Pain Score: 5  Pain Location: R knee Pain Intervention(s): Premedicated before session    Home Living Family/patient expects to be discharged to:: Private residence Living Arrangements: Spouse/significant other Available Help at Discharge: Family Type of Home: House Home Access: Stairs to enter Entrance Stairs-Rails: Right Entrance Stairs-Number of Steps: 2 front no rail 3 back with a rail on R during ascend Home Layout: One level Home Equipment: Walker - 2 wheels;Shower seat - built in;Hand held shower head;Grab bars - tub/shower (reacher, walk-in shower)      Prior Function Level of Independence: Independent         Comments: Including driving     Hand Dominance        Extremity/Trunk Assessment   Upper Extremity Assessment: Defer to OT evaluation;Overall WFL for tasks assessed           Lower Extremity Assessment: Overall WFL for tasks assessed;RLE deficits/detail RLE Deficits / Details: Able to perform full SLR and SAQ x 10 without assist. R ankle DF is 5/5. Good abduction/adduction strength. LLE is at least 4+/5 throughout       Communication   Communication: No difficulties  Cognition Arousal/Alertness: Awake/alert Behavior During Therapy: WFL for tasks assessed/performed Overall Cognitive Status: Within Functional Limits for tasks assessed                      General Comments      Exercises Total Joint Exercises Ankle Circles/Pumps: Strengthening;Both;10 reps;Supine Quad Sets: Strengthening;Both;10 reps;Supine Gluteal Sets: Strengthening;Both;10  reps;Supine Towel Squeeze: Strengthening;Both;10 reps;Supine Short Arc Quad: Strengthening;10 reps;Supine;Both Heel Slides: Strengthening;Both;10 reps;Supine Hip ABduction/ADduction: Strengthening;Both;10 reps;Supine Straight Leg Raises: Strengthening;Both;10 reps;Supine Goniometric ROM: -2 to 60 AROM with overpressure, pain limited      Assessment/Plan    PT Assessment Patient needs continued PT services  PT Diagnosis Difficulty walking;Abnormality of gait;Acute pain;Generalized weakness   PT Problem List Decreased strength;Decreased range of motion;Decreased activity tolerance;Decreased balance;Decreased mobility;Decreased coordination;Decreased safety awareness;Pain  PT Treatment Interventions DME instruction;Gait training;Stair training;Therapeutic activities;Therapeutic exercise;Balance training;Neuromuscular re-education;Patient/family education;Manual techniques   PT Goals (Current goals can be found in the Care Plan section) Acute Rehab PT Goals Patient Stated Goal: "I would like to go home" PT Goal Formulation: With patient/family Time For Goal Achievement: 05/14/15 Potential to Achieve Goals: Good    Frequency BID   Barriers to discharge        Co-evaluation               End of Session Equipment Utilized During Treatment: Gait belt Activity Tolerance: Patient tolerated treatment well Patient left: in chair;with call bell/phone within reach;with chair alarm set;with SCD's reapplied Nurse Communication: Mobility status         Time: [id number] PT Time Calculation (min) (ACUTE ONLY): 33 min   Charges:   PT Evaluation $Initial PT Evaluation Tier I: 1 Procedure PT Treatments $Therapeutic Exercise: 8-22 mins   PT G Codes:       Sharalyn InkJason D Jayleigh Notarianni PT, DPT   Tami Blass 04/30/2015, 12:59 PM

## 2015-04-30 NOTE — Progress Notes (Signed)
Pt alert and oriented. Pain medication effective. Tolerated diet ok. Medicated for nausea with relief. Polar care to rigiht knee. Dsg dry and intact. No acute distress noted during this shift. 7p-7a

## 2015-04-30 NOTE — Progress Notes (Signed)
   Subjective: 1 Day Post-Op Procedure(s) (LRB): TOTAL KNEE ARTHROPLASTY (Right) Patient reports pain as 3 on 0-10 scale.   Patient is well, and has had no acute complaints or problems We will start therapy today.  Plan is to go Home after hospital stay. no nausea and no vomiting. Was noted to have some yesterday. Possible secondary to the Dilaudid. Patient denies any chest pains or shortness of breath. Objective: Vital signs in last 24 hours: Temp:  [97.5 F (36.4 C)-99.3 F (37.4 C)] 98.1 F (36.7 C) (06/28 0406) Pulse Rate:  [51-101] 67 (06/28 0406) Resp:  [12-18] 18 (06/28 0406) BP: (128-150)/(49-86) 135/54 mmHg (06/28 0406) SpO2:  [94 %-100 %] 100 % (06/28 0406) FiO2 (%):  [21 %] 21 % (06/27 1251) Weight:  [80 kg (176 lb 5.9 oz)] 80 kg (176 lb 5.9 oz) (06/27 1251) Patient has original dressing in place Heels are non tender and elevated off the bed using rolled towels Intake/Output from previous day: 06/27 0701 - 06/28 0700 In: 2445 [I.V.:2445] Out: 1920 [Urine:1910; Blood:10] Intake/Output this shift:     Recent Labs  04/29/15 1239  HGB 11.2*    Recent Labs  04/29/15 1239  WBC 17.1*  RBC 4.14  HCT 35.5  PLT 250    Recent Labs  04/29/15 1239  K 4.1  CREATININE 1.04*   No results for input(s): LABPT, INR in the last 72 hours.  EXAM General - Patient is Alert, Appropriate and Oriented Extremity - Neurologically intact Neurovascular intact Sensation intact distally Intact pulses distally Dorsiflexion/Plantar flexion intact Dressing - dressing C/D/I Motor Function - intact, moving foot and toes well on exam. Good strength against dorsiflexion and plantarflexion of the foot but is unable to do a straight leg raise on her own  Past Medical History  Diagnosis Date  . Hyperlipidemia   . Coronary artery disease   . Obstructive sleep apnea   . Esophageal reflux   . Essential hypertension, benign   . Dysmetabolic syndrome X   . Other abnormal glucose    . Toxic effect of venom(989.5)   . Allergic rhinitis, cause unspecified   . Unspecified asthma(493.90)   . Lumbago   . Osteoarthrosis, unspecified whether generalized or localized, lower leg   . Lump or mass in breast   . Dysuria   . 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 Unspecified iridocyclitis   . Unspecified vitamin D deficiency   . Other and unspecified angina pectoris   . Myalgia and myositis, unspecified   . MI (myocardial infarction)     Assessment/Plan: 1 Day Post-Op Procedure(s) (LRB): TOTAL KNEE ARTHROPLASTY (Right) Active Problems:   S/P total knee replacement  Estimated body mass index is 31.25 kg/(m^2) as calculated from the following:   Height as of this encounter: 5\' 3"  (1.6 m).   Weight as of this encounter: 80 kg (176 lb 5.9 oz). Advance diet Up with therapy D/C IV fluids Discharge home with home health  Labs: Reviewed and we'll order labs for tomorrow morning DVT Prophylaxis - Lovenox, Foot Pumps and TED hose Weight-Bearing as tolerated to right leg Bowel movement Plan to discharge to home on Thursday D/C O2 and Pulse OX and try on Room [employer]  Rune Mendez R. [facility] PA [facility] 04/30/2015, 7:42 AM

## 2015-04-30 NOTE — Evaluation (Signed)
Occupational Therapy Evaluation Patient Details Name: Jodi Kirby MRN: [id number] DOB: 12-10-1945 Today's Date: 04/30/2015    History of Present Illness This patient is a 69 year old female who came to [facility] for a right TKR.   Clinical Impression   This patient is a 69 year old female who came to [facility] for a R total knee replacement.  Patient lives in a [place] home with her husband, with 2 steps to enter in front with no rail and 3 in back with a rail.  She had been independent with ADL and functional mobility including driving. She now requires some assistance for lower body dressing. She would benefit from Occupational Therapy for ADL/functioal mobility training for a full dressing activity.     Follow Up Recommendations       Equipment Recommendations       Recommendations for Other Services       Precautions / Restrictions Precautions Precautions: Knee Restrictions Weight Bearing Restrictions: Yes RLE Weight Bearing: Weight bearing as tolerated      Mobility Bed Mobility                  Transfers                      Balance                                            ADL                                         General ADL Comments: Patient had been independent including driving. Now for dressing need minimal assist to practice donning and doffing socks and pants to knees using hip kit from bed.     Vision     Perception     Praxis      Pertinent Vitals/Pain Pain Assessment: 0-10 Pain Score: 5  Pain Location: R knee (meds had been given, nurse present.)     Hand Dominance     Extremity/Trunk Assessment Upper Extremity Assessment Upper Extremity Assessment:  (B UE 5/5)   Lower Extremity Assessment Lower Extremity Assessment: Defer to PT evaluation       Communication Communication Communication: No difficulties   Cognition Arousal/Alertness:  Awake/alert Behavior During Therapy: WFL for tasks assessed/performed Overall Cognitive Status: Within Functional Limits for tasks assessed                     General Comments       Exercises       Shoulder Instructions      Home Living Family/patient expects to be discharged to:: Private residence Living Arrangements: Spouse/significant other Available Help at Discharge: Family Type of Home: House Home Access: Stairs to enter [employer] of Steps: 2 front no rail 3 back with a rail   Home Layout: One level     Bathroom Shower/Tub: Walk-in shower         Home Equipment: Environmental consultant - 2 wheels;Shower seat - built in;Hand held Chief Financial Officer)          Prior Functioning/Environment Level of Independence: Independent        Comments: Including driving    OT Diagnosis: Generalized weakness   OT  Problem List: Pain (ADL deficits)   OT Treatment/Interventions: Self-care/ADL training    OT Goals(Current goals can be found in the care plan section) Acute Rehab OT Goals OT Goal Formulation: With patient/family Time For Goal Achievement: 05/14/15 Potential to Achieve Goals: Good  OT Frequency: Min 1X/week   Barriers to D/C:            Co-evaluation              End of Session Equipment Utilized During Treatment:  (Hip kit)  Activity Tolerance: Patient tolerated treatment well Patient left: in bed;with call bell/phone within reach;with bed alarm set;with nursing/sitter in room;with family/visitor present   Time: 9056-4698 OT Time Calculation (min): 22 min Charges:  OT General Charges $OT Visit: 1 Procedure OT Evaluation $Initial OT Evaluation Tier I: 1 Procedure OT Treatments $Self Care/Home Management : 8-22 mins G-Codes:    Myrene Galas, MS/OTR/L  04/30/2015, 11:05 AM

## 2015-04-30 NOTE — Progress Notes (Signed)
Physical Therapy Treatment Patient Details Name: Jodi HellingRosely J Kirby MRN: [id number] DOB: Jul 06, 1946 Today's Date: 04/30/2015    History of Present Illness This patient is a 69 year old female who came to [facility] for [facility] right [facility].    PT Comments    Pt demonstrates excellent strength, transfers, and ambulation with therapist. She is able to significantly improve her gait speed and distance this afternoon. Pt is able to complete all bed and seated exercises as instructed. Pt will benefit from skilled PT services to address deficits in strength, balance, and mobility in order to return to full function at home.    Follow Up Recommendations  Home health PT     Equipment Recommendations  None recommended by PT    Recommendations for Other Services       Precautions / Restrictions Precautions Precautions: Knee Precaution Booklet Issued: Yes (comment) Precaution Comments: towel roll under heel, polar care Restrictions Weight Bearing Restrictions: Yes RLE Weight Bearing: Weight bearing as tolerated    Mobility  Bed Mobility Overal bed mobility: Needs Assistance Bed Mobility: Sit to Supine     Supine to sit: Min assist Sit to supine: Min guard   General bed mobility comments: Educated about use of LLE to assist RLE. Decreased speed but overall good R hip flexion strength noted  Transfers Overall transfer level: Needs assistance Equipment used: Rolling walker (2 wheeled) Transfers: Sit to/from Stand Sit to Stand: Min guard         General transfer comment: Cues again for hand placement during descent. Overall speed and sequencing improved since AM session. Good stability noted  Ambulation/Gait Ambulation/Gait assistance: Min guard Ambulation Distance (Feet): 150 Feet Assistive device: Rolling walker (2 wheeled) Gait Pattern/deviations: Step-through pattern;Decreased step length - left;Decreased stance time - right;Decreased weight shift to right;Antalgic   Gait velocity  interpretation: Below normal speed for age/gender General Gait Details: Cues provided for patient to progress to full reciprocal pattern with constant walker speed. Pain and fatigue monitored throughout ambulation distance. Pt encouraged to allow for more RLE WB and demonstrates improved weight acceptance throughou session   Stairs            Wheelchair Mobility    Modified Rankin (Stroke Patients Only)       Balance Overall balance assessment: Needs assistance Sitting-balance support: No upper extremity supported;Feet supported Sitting balance-Leahy Scale: Good     Standing balance support: Bilateral upper extremity supported Standing balance-Leahy Scale: Good (Pt able to stand this PM without UE support)                      Cognition Arousal/Alertness: Awake/alert Behavior During Therapy: WFL for tasks assessed/performed Overall Cognitive Status: Within Functional Limits for tasks assessed                      Exercises Total Joint Exercises Ankle Circles/Pumps: Strengthening;Both;10 reps;Supine Quad Sets: Strengthening;Both;10 reps;Supine Gluteal Sets: Strengthening;Both;10 reps;Supine Towel Squeeze: Strengthening;Both;10 reps;Supine Short Arc Quad: Strengthening;10 reps;Supine;Both Heel Slides: Strengthening;Both;Supine;5 reps (Educated use of towel to assist) Hip ABduction/ADduction: Strengthening;Both;Supine;5 reps (Educated use of towel to assist) Straight Leg Raises: Strengthening;Both;Supine;5 reps (Educated use of towel to assist) Long Arc Quad: Strengthening;Both;10 reps;Seated Knee Flexion: Strengthening;Both;10 reps;Seated Goniometric ROM: -2 to 60 AROM with overpressure, pain limited Marching in Standing: Strengthening;Both;10 reps;Seated    General Comments        Pertinent Vitals/Pain Pain Assessment: 0-10 Pain Score: 5  Pain Location: R knee Pain Intervention(s): Premedicated before  session;Monitored during session    Home  Living Family/patient expects to be discharged to:: Private residence Living Arrangements: Spouse/significant other Available Help at Discharge: Family Type of Home: House Home Access: Stairs to enter Entrance Stairs-Rails: Right Home Layout: One level Home Equipment: Environmental consultant - 2 wheels;Shower seat - built in;Hand held shower head;Grab bars - tub/shower Lexicographer, walk-in shower)      Prior Function Level of Independence: Independent      Comments: Including driving   PT Goals (current goals can now be found in the care plan section) Acute Rehab PT Goals Patient Stated Goal: "I would like to go home" PT Goal Formulation: With patient/family Time For Goal Achievement: 05/14/15 Potential to Achieve Goals: Good Progress towards PT goals: Progressing toward goals    Frequency  BID    PT Plan Current plan remains appropriate    Co-evaluation             End of Session Equipment Utilized During Treatment: Gait belt Activity Tolerance: Patient tolerated treatment well Patient left: with call bell/phone within reach;with SCD's reapplied;in bed;with bed alarm set (towel roll under heel, polar care in place)     Time: 1914-7829 PT Time Calculation (min) (ACUTE ONLY): 27 min  Charges:  $Gait Training: 8-22 mins $Therapeutic Exercise: 8-22 mins                    G Codes:      Sharalyn Ink Huprich PT, DPT   Huprich,Jason 04/30/2015, 4:18 PM

## 2015-05-01 LAB — BASIC METABOLIC PANEL
ANION GAP: 7 (ref 5–15)
BUN: 11 mg/dL (ref 6–20)
CHLORIDE: 97 mmol/L — AB (ref 101–111)
CO2: 30 mmol/L (ref 22–32)
CREATININE: 0.9 mg/dL (ref 0.44–1.00)
Calcium: 8.7 mg/dL — ABNORMAL LOW (ref 8.9–10.3)
GFR calc Af Amer: >60 mL/min (ref >60)
GFR calc non Af Amer: >60 mL/min (ref >60)
Glucose, Bld: 145 mg/dL — ABNORMAL HIGH (ref 65–99)
Potassium: 3.9 mmol/L (ref 3.5–5.1)
Sodium: 134 mmol/L — ABNORMAL LOW (ref 135–145)

## 2015-05-01 MED ORDER — PANTOPRAZOLE SODIUM 40 MG PO TBEC
40.0000 mg | DELAYED_RELEASE_TABLET | Freq: Two times a day (BID) | ORAL | Status: DC
  Administered 2015-05-01 – 2015-05-02 (×2): 40 mg via ORAL
  Filled 2015-05-01 (×2): qty 1

## 2015-05-01 NOTE — Progress Notes (Signed)
Physical Therapy Treatment Patient Details Name: Jodi Kirby MRN: [id number] DOB: 10/20/1946 Today's Date: 05/01/2015    History of Present Illness This patient is a 69 year old female who [facility].    PT Comments    Pt making excellent progress with physical therapy on this date. She is even able to ambulate short distance with single point cane. Pt demonstrating good strength although she does have considerable R knee extensor lag during LAQ. Good safety and stability with bed mobility, transfers, and ambulation with rolling walker. Pt will benefit from skilled PT services to address deficits in strength, balance, and mobility in order to return to full function at home.    Follow Up Recommendations  Home health PT     Equipment Recommendations  None recommended by PT    Recommendations for Other Services       Precautions / Restrictions Precautions Precautions: Knee Precaution Booklet Issued: Yes (comment) Precaution Comments: towel roll under heel, polar care Restrictions Weight Bearing Restrictions: No RLE Weight Bearing: Weight bearing as tolerated    Mobility  Bed Mobility Overal bed mobility: Needs Assistance Bed Mobility: Sit to Supine     Supine to sit: Supervision     General bed mobility comments: Good strength and sequencing noted without need for assist  Transfers Overall transfer level: Needs assistance Equipment used: Rolling walker (2 wheeled) Transfers: Sit to/from Stand Sit to Stand: Min guard         General transfer comment: Cues again for hand placement during descent. Overall speed and sequencing improving with increased weight acceptance to RLE and improving R knee flexion. Good stability noted  Ambulation/Gait Ambulation/Gait assistance: Min guard Ambulation Distance (Feet): 200 Feet Assistive device: Rolling walker (2 wheeled) (75' performed with single point cane) Gait Pattern/deviations: Decreased stance  time - right;Decreased weight shift to right;Step-through pattern;Antalgic Gait velocity: 10'=14.91 sec; 0.67 ft/sec Gait velocity interpretation: <1.8 ft/sec, indicative of risk for recurrent falls General Gait Details: Pt is able to increase ambulation distance today. She progresses to reciprocal pattern but overall speed is still decreased. Pt is able to perform 8675' of ambulation with single point cane however toward the end has buckle of R knee to rolling walker reintroduced. Pt provided cues throughout with fatigue monitored   Stairs            Wheelchair Mobility    Modified Rankin (Stroke Patients Only)       Balance     Sitting balance-Leahy Scale: Good       Standing balance-Leahy Scale: Good                      Cognition Arousal/Alertness: Awake/alert Behavior During Therapy: WFL for tasks assessed/performed Overall Cognitive Status: Within Functional Limits for tasks assessed                      Exercises Total Joint Exercises Towel Squeeze: Strengthening;Both;Seated;15 reps Heel Slides: Strengthening;Both;Seated;15 reps (Educated use of towel to assist) Hip ABduction/ADduction: Strengthening;Both;Supine;15 reps (Educated use of towel to assist) Straight Leg Raises:  (Educated use of towel to assist) Long Arc Quad: Strengthening;Both;10 reps;Seated Goniometric ROM: -2 to 75 degrees AROM with OP, pain limited  Marching in Standing: Strengthening;Both;10 reps;Seated    General Comments        Pertinent Vitals/Pain Pain Assessment: 0-10 Pain Score: 0-No pain (Pain increases to 5/10 with ambulation) Pain Intervention(s): Premedicated before session;Monitored during session  Home Living                      Prior Function            PT Goals (current goals can now be found in the care plan section) Acute Rehab PT Goals Patient Stated Goal: "I would like to go home" PT Goal Formulation: With patient/family Time For  Goal Achievement: 05/14/15 Potential to Achieve Goals: Good Progress towards PT goals: Progressing toward goals    Frequency  BID    PT Plan Current plan remains appropriate    Co-evaluation             End of Session Equipment Utilized During Treatment: Gait belt Activity Tolerance: Patient tolerated treatment well Patient left: with call bell/phone within reach;with SCD's reapplied;in chair;with nursing/sitter in room (towel roll under heel, polar care in place)     Time: 0930-1001 PT Time Calculation (min) (ACUTE ONLY): 31 min  Charges:  $Gait Training: 8-22 mins $Therapeutic Exercise: 8-22 mins                    G Codes:      Sharalyn Ink Huprich PT, DPT   Huprich,Jason 05/01/2015, 10:25 AM

## 2015-05-01 NOTE — Plan of Care (Signed)
Problem: Consults Goal: Diagnosis- Total Joint Replacement Primary Total Knee     

## 2015-05-01 NOTE — Progress Notes (Signed)
Temp. 98--Hgb. Is 11.2. Pt. reasonably comfortable--has ambulated a short distance. Foley D/C'd. To be discharged home to be followed by Home Health PT. Hopefully will be able to be D/C'd home tomorrow. To continue Lovenox at home for about 10 days post discharge. To RTC about 1 wk. post discharge

## 2015-05-01 NOTE — Progress Notes (Signed)
   Subjective: 2 Days Post-Op Procedure(s) (LRB): TOTAL KNEE ARTHROPLASTY (Right) Patient reports pain as mild.   Patient is well, and has had no acute complaints or problems We will continue therapy today.  Plan is to go Home after hospital stay.  Objective: Vital signs in last 24 hours: Temp:  [98 F (36.7 C)-99.5 F (37.5 C)] 98 F (36.7 C) (06/29 0749) Pulse Rate:  [68-90] 82 (06/29 0749) Resp:  [16-18] 16 (06/29 0749) BP: (124-155)/(57-68) 124/68 mmHg (06/29 0749) SpO2:  [96 %-100 %] 98 % (06/29 0749)  Intake/Output from previous day: 06/28 0701 - 06/29 0700 In: 720 [P.O.:720] Out: 325 [Urine:325] Intake/Output this shift:     Recent Labs  04/29/15 1239  HGB 11.2*    Recent Labs  04/29/15 1239  WBC 17.1*  RBC 4.14  HCT 35.5  PLT 250    Recent Labs  04/30/15 1016 05/01/15 0425  NA 137 134*  K 4.3 3.9  CL 101 97*  CO2 25 30  BUN 12 11  CREATININE 1.02* 0.90  GLUCOSE 160* 145*  CALCIUM 8.7* 8.7*   No results for input(s): LABPT, INR in the last 72 hours.  EXAM General - Patient is Alert, Appropriate and Oriented Extremity - Neurovascular intact Sensation intact distally Intact pulses distally Dorsiflexion/Plantar flexion intact No cellulitis present Dressing - dressing C/D/I and scant drainage Motor Function - intact, moving foot and toes well on exam.   Past Medical History  Diagnosis Date  . Hyperlipidemia   . Coronary artery disease   . Obstructive sleep apnea   . Esophageal reflux   . Essential hypertension, benign   . Dysmetabolic syndrome X   . Other abnormal glucose   . Toxic effect of venom(989.5)   . Allergic rhinitis, cause unspecified   . Unspecified asthma(493.90)   . Lumbago   . Osteoarthrosis, unspecified whether generalized or localized, lower leg   . Lump or mass in breast   . Dysuria   . 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 Unspecified iridocyclitis   .  Unspecified vitamin D deficiency   . Other and unspecified angina pectoris   . Myalgia and myositis, unspecified   . MI (myocardial infarction)     Assessment/Plan:   2 Days Post-Op Procedure(s) (LRB): TOTAL KNEE ARTHROPLASTY (Right) Active Problems:   S/P total knee replacement  Estimated body mass index is 31.25 kg/(m^2) as calculated from the following:   Height as of this encounter: 5\' 3"  (1.6 m).   Weight as of this encounter: 80 kg (176 lb 5.9 oz). Advance diet Up with therapy D/C IV fluids  DVT Prophylaxis - Lovenox, Foot Pumps and TED hose Weight-Bearing as tolerated to right leg D/C O2 and Pulse OX and try on Room Air  T. Cranston Neighborhris Gaines, PA-C [facility] 05/01/2015, 8:04 AM

## 2015-05-01 NOTE — Progress Notes (Signed)
Physical Therapy Treatment Patient Details Name: Jodi Kirby MRN: [id number] DOB: 11/17/1945 Today's Date: 05/01/2015    History of Present Illness This patient is a 69 year old female who came to [facility] for a right TKR.    PT Comments    Pt demonstrates progressing mobility and ambulation. She is able to ambulate sufficient distance to complete a full lap around RN station and is also able to ascend/descend stairs safely. Pt has met all PT barriers to discharge at this time and is safe to return home with [facility] PT when medically indicated. Pt will benefit from skilled PT services to address deficits in strength, balance, and mobility in order to return to full function at home.    Follow Up Recommendations  Home health PT     Equipment Recommendations  None recommended by PT    Recommendations for Other Services       Precautions / Restrictions Precautions Precautions: Knee Precaution Booklet Issued: Yes (comment) Precaution Comments: towel roll under heel, polar care Restrictions Weight Bearing Restrictions: No RLE Weight Bearing: Weight bearing as tolerated    Mobility  Bed Mobility Overal bed mobility: Needs Assistance Bed Mobility: Sit to Supine;Supine to Sit     Supine to sit: Supervision Sit to supine: Supervision   General bed mobility comments: Good strength and sequencing noted without need for assist. Cues to use LLE to assist RLE  Transfers Overall transfer level: Needs assistance Equipment used: Rolling walker (2 wheeled) Transfers: Sit to/from Stand Sit to Stand: Min guard         General transfer comment: Good hand placement and sequencing during transfer. Improving weight acceptance to RLE. Improving R knee flexion during descent  Ambulation/Gait Ambulation/Gait assistance: Min guard Ambulation Distance (Feet): 220 Feet Assistive device: Rolling walker (2 wheeled) Gait Pattern/deviations: Step-through pattern;Decreased step length -  left;Decreased weight shift to right   Gait velocity interpretation: <1.8 ft/sec, indicative of risk for recurrent falls General Gait Details: Pt demonstrates improving ambulation distance and gait quality. Cues for R foot push off, increased R knee flexion during swing, and improved weight acceptance to RLE. Pt does require some assist to keep walker out in front of body and maintain smooth advancement of walker.    Stairs Stairs: Yes Stairs assistance: Min guard Stair Management: One rail Right;Two rails;Step to pattern Number of Stairs: 4 General stair comments: Good speed and sequencing. Pt educated about how to perform stairs safely  Wheelchair Mobility    Modified Rankin (Stroke Patients Only)       Balance     Sitting balance-Leahy Scale: Good       Standing balance-Leahy Scale: Good                      Cognition Arousal/Alertness: Awake/alert Behavior During Therapy: WFL for tasks assessed/performed Overall Cognitive Status: Within Functional Limits for tasks assessed                      Exercises Total Joint Exercises Towel Squeeze: Strengthening;Both;Seated;15 reps Heel Slides: Strengthening;Both;Seated;15 reps Hip ABduction/ADduction: Strengthening;Both;15 reps;Seated Straight Leg Raises:  (Educated use of towel to assist) Long Arc Quad: Strengthening;Both;Seated;15 Theatre manager in Standing: Strengthening;Both;10 reps;Standing (Also performed in seated) General Exercises - Lower Extremity Mini-Sqauts: Strengthening;Both;10 reps;Standing    General Comments        Pertinent Vitals/Pain Pain Assessment: 0-10 Pain Score: 0-No pain Pain Intervention(s): Monitored during session    Home Living  Prior Function            PT Goals (current goals can now be found in the care plan section) Acute Rehab PT Goals Patient Stated Goal: "I would like to go home" PT Goal Formulation: With patient/family Time  For Goal Achievement: 05/14/15 Potential to Achieve Goals: Good Progress towards PT goals: Progressing toward goals    Frequency  BID    PT Plan Current plan remains appropriate    Co-evaluation             End of Session Equipment Utilized During Treatment: Gait belt Activity Tolerance: Patient tolerated treatment well Patient left: with call bell/phone within reach;with SCD's reapplied;in bed;with bed alarm set (towel roll under heel, polar care in place)     Time:  -     Charges:  $Gait Training: 8-22 mins $Therapeutic Exercise: 8-22 mins                    G Codes:      Jason D Huprich PT, DPT Huprich,Jason 05/01/2015, 3:07 PM   

## 2015-05-01 NOTE — Care Management (Signed)
Important Message  Patient Details  Name: Coralyn HellingRosely J Cart MRN: [id number] Date of Birth: Mar 16, 1946   Medicare Important Message Given:  Yes-second notification given    Verita SchneidersKathy A Allmond 05/01/2015, 1:26 PM

## 2015-05-02 ENCOUNTER — Other Ambulatory Visit: Payer: Self-pay | Admitting: Unknown Physician Specialty

## 2015-05-02 LAB — TYPE AND SCREEN
ABO/RH(D): O POS
Antibody Screen: NEGATIVE
Unit division: 0
Unit division: 0

## 2015-05-02 MED ORDER — ENOXAPARIN SODIUM 40 MG/0.4ML ~~LOC~~ SOLN: 30.0000 mg | Freq: Two times a day (BID) | SUBCUTANEOUS | Status: DC

## 2015-05-02 MED ORDER — OXYCODONE HCL 5 MG PO TABS: 5.0000 mg | ORAL_TABLET | ORAL | Status: DC | PRN

## 2015-05-02 MED ORDER — ACETAMINOPHEN 325 MG PO TABS: 650.0000 mg | ORAL_TABLET | Freq: Four times a day (QID) | ORAL | Status: DC | PRN

## 2015-05-02 MED ORDER — TRAMADOL HCL 50 MG PO TABS: 50.0000 mg | ORAL_TABLET | ORAL | Status: DC | PRN

## 2015-05-02 NOTE — Progress Notes (Signed)
  Subjective: 3 Days Post-Op Procedure(s) (LRB): TOTAL KNEE ARTHROPLASTY (Right) Patient reports pain as mild.   Patient seen in rounds [facility]. Patient is well, and has had no acute complaints or problems Plan is to go Home after hospital stay. Negative for chest pain and shortness of breath Fever: no Gastrointestinal: Negative for nausea and vomiting  Objective: Vital signs in last 24 hours: Temp:  [98 F (36.7 C)-100.4 F (38 C)] 98.8 F (37.1 C) (06/30 0409) Pulse Rate:  [77-87] 77 (06/30 0409) Resp:  [16-20] 20 (06/30 0409) BP: (124-142)/(52-68) 131/58 mmHg (06/30 0409) SpO2:  [98 %-99 %] 99 % (06/30 0409)  Intake/Output from previous day:  Intake/Output Summary (Last 24 hours) at 05/02/15 0557 Last data filed at 05/01/15 1700  Gross per 24 hour  Intake    120 ml  Output      0 ml  Net    120 ml    Intake/Output this shift:    Labs:  Recent Labs  04/29/15 1239  HGB 11.2*    Recent Labs  04/29/15 1239  WBC 17.1*  RBC 4.14  HCT 35.5  PLT 250    Recent Labs  04/30/15 1016 05/01/15 0425  NA 137 134*  K 4.3 3.9  CL 101 97*  CO2 25 30  BUN 12 11  CREATININE 1.02* 0.90  GLUCOSE 160* 145*  CALCIUM 8.7* 8.7*   No results for input(s): LABPT, INR in the last 72 hours.   EXAM General - Patient is Alert and Oriented Extremity - Neurovascular intact Dorsiflexion/Plantar flexion intact No cellulitis present Dressing/Incision - clean, dry, good granulation tissue, healing Motor Function - intact, moving foot and toes well on exam. Ambulating well from the bathroom.  Past Medical History  Diagnosis Date  . Hyperlipidemia   . Coronary artery disease   . Obstructive sleep apnea   . Esophageal reflux   . Essential hypertension, benign   . Dysmetabolic syndrome X   . Other abnormal glucose   . Toxic effect of venom(989.5)   . Allergic rhinitis, cause unspecified   . Unspecified asthma(493.90)   . Lumbago   . Osteoarthrosis,  unspecified whether generalized or localized, lower leg   . Lump or mass in breast   . Dysuria   . 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 Unspecified iridocyclitis   . Unspecified vitamin D deficiency   . Other and unspecified angina pectoris   . Myalgia and myositis, unspecified   . MI (myocardial infarction)     Assessment/Plan: 3 Days Post-Op Procedure(s) (LRB): TOTAL KNEE ARTHROPLASTY (Right) Active Problems:   S/P total knee replacement  Estimated body mass index is 31.25 kg/(m^2) as calculated from the following:   Height as of this encounter: 5\' 3"  (1.6 m).   Weight as of this encounter: 80 kg (176 lb 5.9 oz). Discharge home with home health  DVT Prophylaxis - Lovenox, Foot Pumps and TED hose Weight-Bearing as tolerated to right leg  Dedra Skeensodd Zarah Carbon, PA-C Orthopaedic Surgery 05/02/2015, 5:57 AM

## 2015-05-02 NOTE — Progress Notes (Signed)
Patient requesting a bsc for discharge. Informed cmgmt.

## 2015-05-02 NOTE — Discharge Summary (Signed)
Physician Discharge Summary  Subjective: 3 Days Post-Op Procedure(s) (LRB): TOTAL KNEE ARTHROPLASTY (Right) Patient reports pain as mild.   Patient seen in rounds with Dr. Gavin Potters. Patient is well, and has had no acute complaints or problems Patient is ready to go home with home health physical therapy.  Physician Discharge Summary  Patient ID: Jodi Kirby MRN: [id number] DOB/AGE: Sep 04, 1946 69 y.o.  Admit date: 04/29/2015 Discharge date: 05/02/2015  Admission Diagnoses:  Discharge Diagnoses:  Active Problems:   S/P total knee replacement   Discharged Condition: good  Hospital Course: After surgery the patient was brought back to the orthopedic floor for physical therapy and pain control. The patient ambulated well with physical therapy initially and progressed to going around the nurse's station as well as doing stairs. He was bending the knee well and was ready to go home with home health physical therapy. He had no blood transfusion and did well medically.  Treatments: surgery:   PROCEDURE: Right total knee arthroplasty  SURGEON: Dr.Harold Royann Shivers. M.D.  ASSISTANT:   ANESTHESIA: Gen. endotracheal  ESTIMATED BLOOD LOSS: 50 mL  TOURNIQUET TIME: 119 minutes   IMPLANTS UTILIZED: A #4 [employer] triathlon PS femoral component, a #4 universal tibial baseplate, a #4 9 mm thick tibial bearing insert, and a 10 mm thick A35 mm patella button  Discharge Exam: Blood pressure 131/58, pulse 77, temperature 98.8 F (37.1 C), temperature source Oral, resp. rate 20, height  (1.6 m), weight 80 kg (176 lb 5.9 oz), SpO2 99 %. Right lower extremity: The patient had the bandage from surgery with minimal discharge. Staples were intact. It was able to ambulate to the bathroom independently. Her range of motion was to about 75 flexion with -5 extension. She has a negative Homans test. She has neurovascular that is intact.  Disposition:  Home with home health physical  therapy    Medication List    STOP taking these medications        aspirin 81 MG tablet      TAKE these medications        acetaminophen 325 MG tablet  Commonly known as:  TYLENOL  Take 2 tablets (650 mg total) by mouth every 6 (six) hours as needed for mild pain (or Fever >/= 101).     albuterol 108 (90 BASE) MCG/ACT inhaler  Commonly known as:  PROVENTIL HFA;VENTOLIN HFA  Inhale 2 puffs into the lungs every 6 (six) hours as needed for wheezing or shortness of breath.     Amlodipine-Valsartan-HCTZ 5-160-25 MG Tabs  Take 1 tablet by mouth daily.     CENTRUM SILVER PO  Take by mouth daily.     enoxaparin 40 MG/0.4ML injection  Commonly known as:  LOVENOX  Inject 0.3 mLs (30 mg total) into the skin every 12 (twelve) hours.     EPIPEN 2-PAK 0.3 mg/0.3 mL Devi  Generic drug:  EPINEPHrine  Inject 0.3 mg into the muscle as needed.     fluticasone 50 MCG/ACT nasal spray  Commonly known as:  FLONASE  Place 2 sprays into the nose daily.     FOSAMAX PO  Take 1 tablet by mouth once a week.     nitroGLYCERIN 0.4 MG SL tablet  Commonly known as:  NITROSTAT  Place 1 tablet (0.4 mg total) under the tongue every 5 (five) minutes as needed for chest pain.     omeprazole 40 MG capsule  Commonly known as:  PRILOSEC  Take 40 mg by mouth 2 (two)  times daily.     oxyCODONE 5 MG immediate release tablet  Commonly known as:  Oxy IR/ROXICODONE  Take 1 tablet (5 mg total) by mouth every 4 (four) hours as needed for breakthrough pain.     rosuvastatin 5 MG tablet  Commonly known as:  CRESTOR  Take 1/2 to 1 tablet daily.     traMADol 50 MG tablet  Commonly known as:  ULTRAM  Take 1-2 tablets (50-100 mg total) by mouth every 4 (four) hours as needed for moderate pain.     Vitamin D3 1000 UNITS Caps  Take 1 capsule by mouth daily.           Follow-up Information    Follow up with Randon GoldsmithKERNODLE,HAROLD B, MD. Go in 2 weeks.   Specialty:  Unknown Physician Specialty   Why:  For wound  re-check and staple removal   Contact information:   [address] [place] [address] [phone]       Signed: Lenard ForthMUNDY, Gjon Letarte 05/02/2015, 6:10 AM   Objective: Vital signs in last 24 hours: Temp:  [98 F (36.7 C)-100.4 F (38 C)] 98.8 F (37.1 C) (06/30 0409) Pulse Rate:  [77-87] 77 (06/30 0409) Resp:  [16-20] 20 (06/30 0409) BP: (124-142)/(52-68) 131/58 mmHg (06/30 0409) SpO2:  [98 %-99 %] 99 % (06/30 0409)  Intake/Output from previous day:  Intake/Output Summary (Last 24 hours) at 05/02/15 0610 Last data filed at 05/01/15 1700  Gross per 24 hour  Intake    120 ml  Output      0 ml  Net    120 ml    Intake/Output this shift:    Labs:  Recent Labs  04/29/15 1239  HGB 11.2*    Recent Labs  04/29/15 1239  WBC 17.1*  RBC 4.14  HCT 35.5  PLT 250    Recent Labs  04/30/15 1016 05/01/15 0425  NA 137 134*  K 4.3 3.9  CL 101 97*  CO2 25 30  BUN 12 11  CREATININE 1.02* 0.90  GLUCOSE 160* 145*  CALCIUM 8.7* 8.7*   No results for input(s): LABPT, INR in the last 72 hours.  EXAM: General - Patient is Alert and Oriented Extremity - Neurovascular intact Intact pulses distally Dorsiflexion/Plantar flexion intact Incision - clean, dry, good granulation tissue, healing Motor Function -  ambulating with weightbearing as tolerated to the nurse's station. The flexion and plantarflexion of the ankle intact.  Assessment/Plan: 3 Days Post-Op Procedure(s) (LRB): TOTAL KNEE ARTHROPLASTY (Right) Procedure(s) (LRB): TOTAL KNEE ARTHROPLASTY (Right) Past Medical History  Diagnosis Date  . Hyperlipidemia   . Coronary artery disease   . Obstructive sleep apnea   . Esophageal reflux   . Essential hypertension, benign   . Dysmetabolic syndrome X   . Other abnormal glucose   . Toxic effect of venom(989.5)   . Allergic rhinitis, cause unspecified   . Unspecified asthma(493.90)   . Lumbago   . Osteoarthrosis, unspecified whether generalized or localized,  lower leg   . Lump or mass in breast   . Dysuria   . 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 Unspecified iridocyclitis   . Unspecified vitamin D deficiency   . Other and unspecified angina pectoris   . Myalgia and myositis, unspecified   . MI (myocardial infarction)    Active Problems:   S/P total knee replacement  Estimated body mass index is 31.25 kg/(m^2) as calculated from the following:   Height  as of this encounter: 5\' 3"  (1.6 m).   Weight as of this encounter: 80 kg (176 lb 5.9 oz). Discharge home with home health Diet - Regular diet Follow up - in 2 weeks Activity - WBAT Disposition - Home Condition Upon Discharge - Good DVT Prophylaxis - Lovenox and TED hose  Dedra Skeens, PA-C Orthopaedic Surgery 05/02/2015, 6:10 AM

## 2015-05-02 NOTE — Care Management (Signed)
Patient discharging home today followed by [facility]. Patient concerned about administering Lovenox injections. She said her husband could give them. RN to instruct husband on administration. Tawanna CoolerChristen with [facility] notified of patient discharge today. No further RNCM needs.

## 2015-05-02 NOTE — Progress Notes (Signed)
Physical Therapy Treatment Patient Details Name: Jodi Kirby MRN: [id number] DOB: 1945/11/22 Today's Date: 05/02/2015    History of Present Illness This patient is a 69 year old female who came to [facility] for a right TKR.    PT Comments    Pt demonstrates excellent progress with gait and mobility. Her stability and strength during transfers is improving. She is able to complete a full lap around RN station as well as ascend/descend stairs in order to safely enter/exit home. Pt has met all barriers to discharge at this time. Appropriate to return home with [facility] PT when medically indicated. Pt will benefit from skilled PT services to address deficits in strength, balance, and mobility in order to return to full function at home.    Follow Up Recommendations  Home health PT     Equipment Recommendations  None recommended by PT    Recommendations for Other Services       Precautions / Restrictions Precautions Precautions: Knee Precaution Booklet Issued: Yes (comment) Precaution Comments: towel roll under heel, polar care Restrictions Weight Bearing Restrictions: No RLE Weight Bearing: Weight bearing as tolerated    Mobility  Bed Mobility Overal bed mobility: Needs Assistance Bed Mobility: Sit to Supine;Supine to Sit     Supine to sit: Supervision Sit to supine: Supervision   General bed mobility comments: Good strength and sequencing noted without need for assist. Cues to use LLE to assist RLE  Transfers Overall transfer level: Needs assistance Equipment used: None Transfers: Sit to/from Stand Sit to Stand: Supervision         General transfer comment: Good hand placement and sequencing during transfer. Improving weight acceptance to RLE. Improving R knee flexion during descent. Pt able to perform sit to stand without rolling walker demonstrating good stability. Improving LE power  Ambulation/Gait Ambulation/Gait assistance: Min guard;Supervision Ambulation  Distance (Feet): 220 Feet Assistive device: Rolling walker (2 wheeled) Gait Pattern/deviations: Decreased stance time - right Gait velocity: 10'=15.27 sec; 0.65 ft/sec Gait velocity interpretation: <1.8 ft/sec, indicative of risk for recurrent falls General Gait Details: Pt with increasing ambulation distance today. She is able to complete a full lap around RN station as well as into rehab gym. Pt provided cues for upright posture, symmetrical step length, continual motion of walker, and decreased UE reliance. No buckling or instability noted with rolling walker   Stairs Stairs: Yes Stairs assistance: Min guard Stair Management: One rail Right Number of Stairs: 4 General stair comments: Good speed and sequencing. No education required for sequencing today. Improving stability and confidence  Wheelchair Mobility    Modified Rankin (Stroke Patients Only)       Balance     Sitting balance-Leahy Scale: Good       Standing balance-Leahy Scale: Good                      Cognition Arousal/Alertness: Awake/alert Behavior During Therapy: WFL for tasks assessed/performed Overall Cognitive Status: Within Functional Limits for tasks assessed                      Exercises Total Joint Exercises Towel Squeeze: Strengthening;Both;15 reps;Seated Heel Slides: Strengthening;Both;Seated;15 reps Hip ABduction/ADduction: Strengthening;Both;15 reps;Seated Straight Leg Raises: Strengthening;Both;15 reps;Standing Long Arc Quad: Strengthening;Both;Seated;15 reps Goniometric ROM: 3 to 84 degree AROM with overpressure, pain limited, more range passive range available Marching in Standing: Strengthening;Both;10 reps;Standing (Also performed in seated x 15) General Exercises - Lower Extremity Mini-Sqauts: Strengthening;Both;10 reps;Standing    General  Comments        Pertinent Vitals/Pain Pain Assessment: 0-10 Pain Score: 0-No pain (3/10 with ambulation) Pain Location: R  knee Pain Intervention(s): Monitored during session;Premedicated before session    Home Living                      Prior Function            PT Goals (current goals can now be found in the care plan section) Acute Rehab PT Goals Patient Stated Goal: "I would like to go home" PT Goal Formulation: With patient/family Time For Goal Achievement: 05/14/15 Potential to Achieve Goals: Good Progress towards PT goals: Progressing toward goals    Frequency  BID    PT Plan Current plan remains appropriate    Co-evaluation             End of Session Equipment Utilized During Treatment: Gait belt Activity Tolerance: Patient tolerated treatment well Patient left: with call bell/phone within reach;with SCD's reapplied;in bed;with bed alarm set (pt requests bed, towel roll under heel, polar care in place)     Time: 5697-9480 PT Time Calculation (min) (ACUTE ONLY): 33 min  Charges:  $Gait Training: 8-22 mins $Therapeutic Exercise: 8-22 mins                    G Codes:      [name] PT, DPT   [name] 05/02/2015, 10:08 AM

## 2015-05-02 NOTE — Discharge Instructions (Signed)
INSTRUCTIONS AFTER Surgery  o Remove items at home which could result in a fall. This includes throw rugs or furniture in walking pathways o ICE to the affected joint every three hours while awake for 30 minutes at a time, for at least the first 3-5 days, and then as needed for pain and swelling.  Continue to use ice for pain and swelling. You may notice swelling that will progress down to the foot and ankle.  This is normal after surgery.  Elevate your leg when you are not up walking on it.   o Continue to use the breathing machine you got in the hospital (incentive spirometer) which will help keep your temperature down.  It is common for your temperature to cycle up and down following surgery, especially at night when you are not up moving around and exerting yourself.  The breathing machine keeps your lungs expanded and your temperature down.   DIET:  As you were doing prior to hospitalization, we recommend a well-balanced diet.  DRESSING / WOUND CARE / SHOWERING  Keep the surgical dressing until follow up.  The dressing is water proof, so you can shower without any extra covering.  IF THE DRESSING FALLS OFF or the wound gets wet inside, change the dressing with sterile gauze.  Please use good hand washing techniques before changing the dressing.  Do not use any lotions or creams on the incision until instructed by your surgeon.    ACTIVITY  o Increase activity slowly as tolerated, but follow the weight bearing instructions below.   o No driving for 6 weeks or until further direction given by your physician.  You cannot drive while taking narcotics.  o No lifting or carrying greater than 10 lbs. until further directed by your surgeon. o Avoid periods of inactivity such as sitting longer than an hour when not asleep. This helps prevent blood clots.  o You may return to work once you are authorized by your doctor.     WEIGHT BEARING   Weight bearing as tolerated with assist device (walker,  cane, etc) as directed, use it as long as suggested by your surgeon or therapist, typically at least 4-6 weeks.   EXERCISES  Results after joint surgery are often greatly improved when you follow the exercise, range of motion and muscle strengthening exercises prescribed by your doctor. Safety measures are also important to protect the joint from further injury. Any time any of these exercises cause you to have increased pain or swelling, decrease what you are doing until you are comfortable again and then slowly increase them. If you have problems or questions, call your caregiver or physical therapist for advice.   Rehabilitation is important following a joint surgery. After just a few days of immobilization, the muscles of the leg can become weakened and shrink (atrophy).  These exercises are designed to build up the tone and strength of the thigh and leg muscles and to improve motion. Often times heat used for twenty to thirty minutes before working out will loosen up your tissues and help with improving the range of motion but do not use heat for the first two weeks following surgery (sometimes heat can increase post-operative swelling).   These exercises can be done on a training (exercise) mat, on the floor, on a table or on a bed. Use whatever works the best and is most comfortable for you.    Use music or television while you are exercising so that the exercises are  a pleasant break in your day. This will make your life better with the exercises acting as a break in your routine that you can look forward to.   Perform all exercises about fifteen times, three times per day or as directed.  You should exercise both the operative leg and the other leg as well.  Exercises include:    Quad Sets - Tighten up the muscle on the front of the thigh (Quad) and hold for 5-10 seconds.    Straight Leg Raises - With your knee straight (if you were given a brace, keep it on), lift the leg to 60 degrees,  hold for 3 seconds, and slowly lower the leg.  Perform this exercise against resistance later as your leg gets stronger.   Leg Slides: Lying on your back, slowly slide your foot toward your buttocks, bending your knee up off the floor (only go as far as is comfortable). Then slowly slide your foot back down until your leg is flat on the floor again.   Angel Wings: Lying on your back spread your legs to the side as far apart as you can without causing discomfort.   Hamstring Strength:  Lying on your back, push your heel against the floor with your leg straight by tightening up the muscles of your buttocks.  Repeat, but this time bend your knee to a comfortable angle, and push your heel against the floor.  You may put a pillow under the heel to make it more comfortable if necessary.   A rehabilitation program following joint surgery can speed recovery and prevent re-injury in the future due to weakened muscles. Contact your doctor or a physical therapist for more information on knee rehabilitation.    CONSTIPATION  Constipation is defined medically as fewer than three stools per week and severe constipation as less than one stool per week.  Even if you have a regular bowel pattern at home, your normal regimen is likely to be disrupted due to multiple reasons following surgery.  Combination of anesthesia, postoperative narcotics, change in appetite and fluid intake all can affect your bowels.   YOU MUST use at least one of the following options; they are listed in order of increasing strength to get the job done.  They are all available over the counter, and you may need to use some, POSSIBLY even all of these options:    Drink plenty of fluids (prune juice may be helpful) and high fiber foods Colace 100 mg by mouth twice a day  Senokot for constipation as directed and as needed Dulcolax (bisacodyl), take with full glass of water  Miralax (polyethylene glycol) once or twice a day as needed.  If  you have tried all these things and are unable to have a bowel movement in the first 3-4 days after surgery call either your surgeon or your primary doctor.    If you experience loose stools or diarrhea, hold the medications until you stool forms back up.  If your symptoms do not get better within 1 week or if they get worse, check with your doctor.  If you experience "the worst abdominal pain ever" or develop nausea or vomiting, please contact the office immediately for further recommendations for treatment.   ITCHING:  If you experience itching with your medications, try taking only a single pain pill, or even half a pain pill at a time.  You can also use Benadryl over the counter for itching or also to help with sleep.  TED HOSE STOCKINGS:  Use stockings on both legs until for at least 2 weeks or as directed by physician office. They may be removed at night for sleeping.  MEDICATIONS:  See your medication summary on the After Visit Summary that nursing will review with you.  You may have some home medications which will be placed on hold until you complete the course of blood thinner medication.  It is important for you to complete the blood thinner medication as prescribed.  PRECAUTIONS:  If you experience chest pain or shortness of breath - call 911 immediately for transfer to the hospital emergency department.   If you develop a fever greater that 101 F, purulent drainage from wound, increased redness or drainage from wound, foul odor from the wound/dressing, or calf pain - CONTACT YOUR SURGEON.                                                   FOLLOW-UP APPOINTMENTS:  If you do not already have a post-op appointment, please call the office for an appointment to be seen by your surgeon.  Guidelines for how soon to be seen are listed in your After Visit Summary, but are typically between 1-4 weeks after surgery.  OTHER INSTRUCTIONS:   Use your TENS unit as needed for pain  control.  MAKE SURE YOU:   Understand these instructions.   Get help right away if you are not doing well or get worse.    Thank you for letting us be a part of your medical care team.  It is a privilege we respect greatly.  We hope these instructions will help you stay on track for a fast and full recovery!

## 2015-05-03 ENCOUNTER — Encounter: Payer: Self-pay | Admitting: *Deleted

## 2015-05-03 ENCOUNTER — Ambulatory Visit: Payer: BC Managed Care – PPO | Admitting: Cardiovascular Disease

## 2015-05-03 DIAGNOSIS — I10 Essential (primary) hypertension: Secondary | ICD-10-CM | POA: Diagnosis not present

## 2015-05-03 DIAGNOSIS — Z96651 Presence of right artificial knee joint: Secondary | ICD-10-CM | POA: Diagnosis not present

## 2015-05-03 DIAGNOSIS — Z471 Aftercare following joint replacement surgery: Secondary | ICD-10-CM | POA: Diagnosis not present

## 2015-05-06 DIAGNOSIS — Z471 Aftercare following joint replacement surgery: Secondary | ICD-10-CM | POA: Diagnosis not present

## 2015-05-06 DIAGNOSIS — Z96651 Presence of right artificial knee joint: Secondary | ICD-10-CM | POA: Diagnosis not present

## 2015-05-06 DIAGNOSIS — I10 Essential (primary) hypertension: Secondary | ICD-10-CM | POA: Diagnosis not present

## 2015-05-08 DIAGNOSIS — I10 Essential (primary) hypertension: Secondary | ICD-10-CM | POA: Diagnosis not present

## 2015-05-08 DIAGNOSIS — Z96651 Presence of right artificial knee joint: Secondary | ICD-10-CM | POA: Diagnosis not present

## 2015-05-08 DIAGNOSIS — Z471 Aftercare following joint replacement surgery: Secondary | ICD-10-CM | POA: Diagnosis not present

## 2015-05-10 DIAGNOSIS — I10 Essential (primary) hypertension: Secondary | ICD-10-CM | POA: Diagnosis not present

## 2015-05-10 DIAGNOSIS — Z471 Aftercare following joint replacement surgery: Secondary | ICD-10-CM | POA: Diagnosis not present

## 2015-05-10 DIAGNOSIS — Z96651 Presence of right artificial knee joint: Secondary | ICD-10-CM | POA: Diagnosis not present

## 2015-05-13 DIAGNOSIS — Z96651 Presence of right artificial knee joint: Secondary | ICD-10-CM | POA: Diagnosis not present

## 2015-05-13 DIAGNOSIS — Z471 Aftercare following joint replacement surgery: Secondary | ICD-10-CM | POA: Diagnosis not present

## 2015-05-13 DIAGNOSIS — I10 Essential (primary) hypertension: Secondary | ICD-10-CM | POA: Diagnosis not present

## 2015-05-15 DIAGNOSIS — Z96651 Presence of right artificial knee joint: Secondary | ICD-10-CM | POA: Diagnosis not present

## 2015-05-15 DIAGNOSIS — I10 Essential (primary) hypertension: Secondary | ICD-10-CM | POA: Diagnosis not present

## 2015-05-15 DIAGNOSIS — Z471 Aftercare following joint replacement surgery: Secondary | ICD-10-CM | POA: Diagnosis not present

## 2015-05-16 DIAGNOSIS — Z96651 Presence of right artificial knee joint: Secondary | ICD-10-CM | POA: Diagnosis not present

## 2015-05-16 DIAGNOSIS — Z9889 Other specified postprocedural states: Secondary | ICD-10-CM | POA: Insufficient documentation

## 2015-05-20 DIAGNOSIS — Z96651 Presence of right artificial knee joint: Secondary | ICD-10-CM | POA: Diagnosis not present

## 2015-05-20 DIAGNOSIS — I10 Essential (primary) hypertension: Secondary | ICD-10-CM | POA: Diagnosis not present

## 2015-05-20 DIAGNOSIS — Z471 Aftercare following joint replacement surgery: Secondary | ICD-10-CM | POA: Diagnosis not present

## 2015-05-20 DIAGNOSIS — G4733 Obstructive sleep apnea (adult) (pediatric): Secondary | ICD-10-CM | POA: Diagnosis not present

## 2015-05-23 DIAGNOSIS — I10 Essential (primary) hypertension: Secondary | ICD-10-CM | POA: Diagnosis not present

## 2015-05-23 DIAGNOSIS — Z471 Aftercare following joint replacement surgery: Secondary | ICD-10-CM | POA: Diagnosis not present

## 2015-05-23 DIAGNOSIS — Z96651 Presence of right artificial knee joint: Secondary | ICD-10-CM | POA: Diagnosis not present

## 2015-05-27 DIAGNOSIS — M6281 Muscle weakness (generalized): Secondary | ICD-10-CM | POA: Diagnosis not present

## 2015-05-27 DIAGNOSIS — M25561 Pain in right knee: Secondary | ICD-10-CM | POA: Diagnosis not present

## 2015-05-27 DIAGNOSIS — Z96651 Presence of right artificial knee joint: Secondary | ICD-10-CM | POA: Diagnosis not present

## 2015-05-27 DIAGNOSIS — M25661 Stiffness of right knee, not elsewhere classified: Secondary | ICD-10-CM | POA: Diagnosis not present

## 2015-05-29 DIAGNOSIS — M6281 Muscle weakness (generalized): Secondary | ICD-10-CM | POA: Diagnosis not present

## 2015-05-29 DIAGNOSIS — M25661 Stiffness of right knee, not elsewhere classified: Secondary | ICD-10-CM | POA: Diagnosis not present

## 2015-05-29 DIAGNOSIS — M25561 Pain in right knee: Secondary | ICD-10-CM | POA: Diagnosis not present

## 2015-05-29 DIAGNOSIS — Z96651 Presence of right artificial knee joint: Secondary | ICD-10-CM | POA: Diagnosis not present

## 2015-05-31 DIAGNOSIS — M6281 Muscle weakness (generalized): Secondary | ICD-10-CM | POA: Diagnosis not present

## 2015-05-31 DIAGNOSIS — Z96651 Presence of right artificial knee joint: Secondary | ICD-10-CM | POA: Diagnosis not present

## 2015-05-31 DIAGNOSIS — M25561 Pain in right knee: Secondary | ICD-10-CM | POA: Diagnosis not present

## 2015-05-31 DIAGNOSIS — M25661 Stiffness of right knee, not elsewhere classified: Secondary | ICD-10-CM | POA: Diagnosis not present

## 2015-06-03 DIAGNOSIS — M25661 Stiffness of right knee, not elsewhere classified: Secondary | ICD-10-CM | POA: Diagnosis not present

## 2015-06-03 DIAGNOSIS — Z96651 Presence of right artificial knee joint: Secondary | ICD-10-CM | POA: Diagnosis not present

## 2015-06-03 DIAGNOSIS — M6281 Muscle weakness (generalized): Secondary | ICD-10-CM | POA: Diagnosis not present

## 2015-06-03 DIAGNOSIS — M25561 Pain in right knee: Secondary | ICD-10-CM | POA: Diagnosis not present

## 2015-06-05 DIAGNOSIS — M25661 Stiffness of right knee, not elsewhere classified: Secondary | ICD-10-CM | POA: Diagnosis not present

## 2015-06-05 DIAGNOSIS — Z96651 Presence of right artificial knee joint: Secondary | ICD-10-CM | POA: Diagnosis not present

## 2015-06-05 DIAGNOSIS — M6281 Muscle weakness (generalized): Secondary | ICD-10-CM | POA: Diagnosis not present

## 2015-06-05 DIAGNOSIS — M25561 Pain in right knee: Secondary | ICD-10-CM | POA: Diagnosis not present

## 2015-06-07 DIAGNOSIS — Z96651 Presence of right artificial knee joint: Secondary | ICD-10-CM | POA: Diagnosis not present

## 2015-06-07 DIAGNOSIS — M25561 Pain in right knee: Secondary | ICD-10-CM | POA: Diagnosis not present

## 2015-06-07 DIAGNOSIS — M6281 Muscle weakness (generalized): Secondary | ICD-10-CM | POA: Diagnosis not present

## 2015-06-07 DIAGNOSIS — M25661 Stiffness of right knee, not elsewhere classified: Secondary | ICD-10-CM | POA: Diagnosis not present

## 2015-06-10 DIAGNOSIS — M25661 Stiffness of right knee, not elsewhere classified: Secondary | ICD-10-CM | POA: Diagnosis not present

## 2015-06-10 DIAGNOSIS — M25561 Pain in right knee: Secondary | ICD-10-CM | POA: Diagnosis not present

## 2015-06-10 DIAGNOSIS — M6281 Muscle weakness (generalized): Secondary | ICD-10-CM | POA: Diagnosis not present

## 2015-06-10 DIAGNOSIS — Z96651 Presence of right artificial knee joint: Secondary | ICD-10-CM | POA: Diagnosis not present

## 2015-06-12 DIAGNOSIS — M25661 Stiffness of right knee, not elsewhere classified: Secondary | ICD-10-CM | POA: Diagnosis not present

## 2015-06-12 DIAGNOSIS — Z96651 Presence of right artificial knee joint: Secondary | ICD-10-CM | POA: Diagnosis not present

## 2015-06-12 DIAGNOSIS — M6281 Muscle weakness (generalized): Secondary | ICD-10-CM | POA: Diagnosis not present

## 2015-06-12 DIAGNOSIS — M25561 Pain in right knee: Secondary | ICD-10-CM | POA: Diagnosis not present

## 2015-06-18 DIAGNOSIS — M25561 Pain in right knee: Secondary | ICD-10-CM | POA: Diagnosis not present

## 2015-06-18 DIAGNOSIS — M6281 Muscle weakness (generalized): Secondary | ICD-10-CM | POA: Diagnosis not present

## 2015-06-18 DIAGNOSIS — M25661 Stiffness of right knee, not elsewhere classified: Secondary | ICD-10-CM | POA: Diagnosis not present

## 2015-06-18 DIAGNOSIS — Z96651 Presence of right artificial knee joint: Secondary | ICD-10-CM | POA: Diagnosis not present

## 2015-06-20 DIAGNOSIS — Z96651 Presence of right artificial knee joint: Secondary | ICD-10-CM | POA: Diagnosis not present

## 2015-06-20 DIAGNOSIS — M25561 Pain in right knee: Secondary | ICD-10-CM | POA: Diagnosis not present

## 2015-06-20 DIAGNOSIS — M6281 Muscle weakness (generalized): Secondary | ICD-10-CM | POA: Diagnosis not present

## 2015-06-20 DIAGNOSIS — M25661 Stiffness of right knee, not elsewhere classified: Secondary | ICD-10-CM | POA: Diagnosis not present

## 2015-06-20 DIAGNOSIS — G4733 Obstructive sleep apnea (adult) (pediatric): Secondary | ICD-10-CM | POA: Diagnosis not present

## 2015-06-24 DIAGNOSIS — H43813 Vitreous degeneration, bilateral: Secondary | ICD-10-CM | POA: Diagnosis not present

## 2015-06-25 DIAGNOSIS — M6281 Muscle weakness (generalized): Secondary | ICD-10-CM | POA: Diagnosis not present

## 2015-06-25 DIAGNOSIS — M25561 Pain in right knee: Secondary | ICD-10-CM | POA: Diagnosis not present

## 2015-06-25 DIAGNOSIS — M25661 Stiffness of right knee, not elsewhere classified: Secondary | ICD-10-CM | POA: Diagnosis not present

## 2015-06-25 DIAGNOSIS — Z96651 Presence of right artificial knee joint: Secondary | ICD-10-CM | POA: Diagnosis not present

## 2015-06-27 DIAGNOSIS — M6281 Muscle weakness (generalized): Secondary | ICD-10-CM | POA: Diagnosis not present

## 2015-06-27 DIAGNOSIS — Z96651 Presence of right artificial knee joint: Secondary | ICD-10-CM | POA: Diagnosis not present

## 2015-06-27 DIAGNOSIS — M25661 Stiffness of right knee, not elsewhere classified: Secondary | ICD-10-CM | POA: Diagnosis not present

## 2015-06-27 DIAGNOSIS — M25561 Pain in right knee: Secondary | ICD-10-CM | POA: Diagnosis not present

## 2015-07-01 DIAGNOSIS — H43813 Vitreous degeneration, bilateral: Secondary | ICD-10-CM | POA: Diagnosis not present

## 2015-07-01 DIAGNOSIS — H33321 Round hole, right eye: Secondary | ICD-10-CM | POA: Diagnosis not present

## 2015-07-02 DIAGNOSIS — H547 Unspecified visual loss: Secondary | ICD-10-CM | POA: Diagnosis not present

## 2015-07-02 DIAGNOSIS — I252 Old myocardial infarction: Secondary | ICD-10-CM | POA: Diagnosis not present

## 2015-07-02 DIAGNOSIS — Z6830 Body mass index (BMI) 30.0-30.9, adult: Secondary | ICD-10-CM | POA: Diagnosis not present

## 2015-07-02 DIAGNOSIS — M179 Osteoarthritis of knee, unspecified: Secondary | ICD-10-CM | POA: Diagnosis not present

## 2015-07-02 DIAGNOSIS — I10 Essential (primary) hypertension: Secondary | ICD-10-CM | POA: Diagnosis not present

## 2015-07-05 ENCOUNTER — Encounter: Payer: Self-pay | Admitting: Family Medicine

## 2015-07-09 ENCOUNTER — Encounter: Payer: Self-pay | Admitting: Family Medicine

## 2015-07-09 ENCOUNTER — Ambulatory Visit (INDEPENDENT_AMBULATORY_CARE_PROVIDER_SITE_OTHER): Payer: Medicare PPO | Admitting: Family Medicine

## 2015-07-09 VITALS — BP 122/62 | HR 66 | Temp 99.0°F | Resp 14 | Ht 63.0 in | Wt 174.1 lb

## 2015-07-09 DIAGNOSIS — Z91030 Bee allergy status: Secondary | ICD-10-CM | POA: Insufficient documentation

## 2015-07-09 DIAGNOSIS — K219 Gastro-esophageal reflux disease without esophagitis: Secondary | ICD-10-CM | POA: Diagnosis not present

## 2015-07-09 DIAGNOSIS — J302 Other seasonal allergic rhinitis: Secondary | ICD-10-CM | POA: Diagnosis not present

## 2015-07-09 DIAGNOSIS — Z9989 Dependence on other enabling machines and devices: Principal | ICD-10-CM

## 2015-07-09 DIAGNOSIS — I2510 Atherosclerotic heart disease of native coronary artery without angina pectoris: Secondary | ICD-10-CM | POA: Diagnosis not present

## 2015-07-09 DIAGNOSIS — G4733 Obstructive sleep apnea (adult) (pediatric): Secondary | ICD-10-CM | POA: Diagnosis not present

## 2015-07-09 DIAGNOSIS — Z96651 Presence of right artificial knee joint: Secondary | ICD-10-CM | POA: Diagnosis not present

## 2015-07-09 DIAGNOSIS — Z23 Encounter for immunization: Secondary | ICD-10-CM | POA: Diagnosis not present

## 2015-07-09 DIAGNOSIS — E785 Hyperlipidemia, unspecified: Secondary | ICD-10-CM | POA: Diagnosis not present

## 2015-07-09 DIAGNOSIS — I2583 Coronary atherosclerosis due to lipid rich plaque: Secondary | ICD-10-CM

## 2015-07-09 DIAGNOSIS — I10 Essential (primary) hypertension: Secondary | ICD-10-CM | POA: Diagnosis not present

## 2015-07-09 MED ORDER — AMOXICILLIN 500 MG PO CAPS: 2000.0000 mg | ORAL_CAPSULE | Freq: Every day | ORAL | Status: DC | PRN

## 2015-07-09 MED ORDER — LORATADINE 10 MG PO TABS: 10.0000 mg | ORAL_TABLET | Freq: Every day | ORAL | Status: DC

## 2015-07-09 NOTE — Addendum Note (Signed)
Addended by: Alba Cory F on: 07/09/2015 10:55 AM   Modules accepted: Orders

## 2015-07-09 NOTE — Progress Notes (Addendum)
Name: Jodi Kirby   MRN: [id number]    DOB: June 17, 1946   Date:07/09/2015       Progress Note  Subjective  Chief Complaint  Chief Complaint  Patient presents with  . Medication Refill    4 month F/U  . Hyperlipidemia  . Hypertension  . Gastrophageal Reflux  . Dental Problem    needs antibiotic for dental work    HPI  OSA: using CPAP every night, she keeps it on for about 7 hours per night, wakes up early in am but feels rested.  Knee replacement surgery right: surgery was June 27 th, 2016, she has completed her PT and flexion to 123 degrees, extension 180 degrees, she is still doing her exercises at home and is thinking about going back to water aerobics. She is no longer taking Tramadol. She will have a dental procedure and needs Amoxicillin to take one hour prior  HTN: taking medication as prescribed, denies side effects of medication, bp is at goal  Hyperlipidemia: unable to tolerate statins, even in a low dose of 2.5 mg of Crestor, muscles aches were severe and she stopped medication  GERD: under control with Omeprazole daily and denies side effects. No heartburn, no regurgitation  AR: using Flonase prn , still has congestion, and would like to go back on Loratadine also   Patient Active Problem List   Diagnosis Date Noted  . Seasonal allergic rhinitis 07/09/2015  . GERD (gastroesophageal reflux disease) 07/09/2015  . Bee sting allergy 07/09/2015  . History of knee replacement procedure of right knee 04/29/2015  . History of colonic polyps 09/24/2014  . Fibrocystic breast 09/24/2014  . Arthritis of knee, degenerative 06/12/2014  . Chest discomfort 02/09/2013  . Hyperlipidemia 02/09/2013  . Coronary artery disease 02/09/2013  . Obstructive sleep apnea on CPAP 02/09/2013  . Essential hypertension 02/09/2013  . Chest pressure 02/09/2013    Past Surgical History  Procedure Laterality Date  . Knee surgery  08/18/2011    arthroscopic right  . Lumbar fusion      L4-5, S-1  . Cystostomy    . Cardiac catheterization  2003    [facility]  . Cardiac catheterization      [facility]  . Neck surgery  2003  . Ankle surgery Right 1980  . Colonoscopy  2008, 2015    Dr. Allen Norris  . Tubal ligation    . Abdominal hysterectomy  1970  . Bladder surgery  2012  . Total knee arthroplasty Right 04/29/2015    Procedure: TOTAL KNEE ARTHROPLASTY;  Surgeon: Leanor Kail, MD;  Location: [facility] ORS;  Service: Orthopedics;  Laterality: Right;    Family History  Problem Relation Age of Onset  . Family history unknown: Yes    Social History   Social History  . Marital Status: Married    Spouse Name: N/A  . Number of Children: N/A  . Years of Education: N/A   Occupational History  . Not on file.   Social History Main Topics  . Smoking status: Never Smoker   . Smokeless tobacco: Never Used  . Alcohol Use: No  . Drug Use: No  . Sexual Activity: Yes   Other Topics Concern  . Not on file   Social History Narrative     Current outpatient prescriptions:  .  acetaminophen (TYLENOL) 325 MG tablet, Take 2 tablets (650 mg total) by mouth every 6 (six) hours as needed for mild pain (or Fever >/= 101)., Disp: 30 tablet, Rfl: 0 .  Alendronate  Sodium (FOSAMAX PO), Take 1 tablet by mouth once a week., Disp: , Rfl:  .  Amlodipine-Valsartan-HCTZ 5-160-25 MG TABS, Take 1 tablet by mouth daily., Disp: , Rfl:  .  aspirin 81 MG tablet, Take 81 mg by mouth daily., Disp: , Rfl:  .  Cholecalciferol (VITAMIN D3) 1000 UNITS CAPS, Take 1 capsule by mouth daily., Disp: , Rfl:  .  EPINEPHrine (EPIPEN 2-PAK) 0.3 mg/0.3 mL DEVI, Inject 0.3 mg into the muscle as needed. , Disp: , Rfl:  .  fluticasone (FLONASE) 50 MCG/ACT nasal spray, Place 2 sprays into the nose daily., Disp: , Rfl:  .  Multiple Vitamins-Minerals (CENTRUM SILVER PO), Take by mouth daily., Disp: , Rfl:  .  nitroGLYCERIN (NITROSTAT) 0.4 MG SL tablet, Place 1 tablet (0.4 mg total) under the tongue every 5 (five) minutes as  needed for chest pain., Disp: 25 tablet, Rfl: 6 .  omeprazole (PRILOSEC) 40 MG capsule, Take 40 mg by mouth 2 (two) times daily., Disp: , Rfl:  .  amoxicillin (AMOXIL) 500 MG capsule, Take 4 capsules (2,000 mg total) by mouth daily as needed (one hour prior to dental procedure)., Disp: 12 capsule, Rfl: 0 .  loratadine (CLARITIN) 10 MG tablet, Take 1 tablet (10 mg total) by mouth daily., Disp: 30 tablet, Rfl: 5  Allergies  Allergen Reactions  . Niaspan [Niacin Er]   . Oxycodone-Acetaminophen Nausea Only  . Statins      ROS  Constitutional: Negative for fever or weight change.  Respiratory: Negative for cough and shortness of breath.   Cardiovascular: Negative for chest pain or palpitations.  Gastrointestinal: Negative for abdominal pain, no bowel changes.  Musculoskeletal: Positive  for gait problem and joint swelling.  Skin: Negative for rash.  Neurological: Negative for dizziness or headache.  No other specific complaints in a complete review of systems (except as listed in HPI above).  Objective  Filed Vitals:   07/09/15 0951  BP: 122/62  Pulse: 66  Temp: 99 F (37.2 C)  TempSrc: Oral  Resp: 14  Height: 5' 3"  (1.6 m)  Weight: 174 lb 1.6 oz (78.971 kg)  SpO2: 96%    Body mass index is 30.85 kg/(m^2).  Physical Exam  Constitutional: Patient appears well-developed and well-nourished. Obese No distress.  HEENT: head atraumatic, normocephalic, pupils equal and reactive to light, neck supple, throat within normal limits Cardiovascular: Normal rate, regular rhythm and normal heart sounds.  No murmur heard. Trace edema of right lower leg Pulmonary/Chest: Effort normal and breath sounds normal. No respiratory distress. Abdominal: Soft.  There is no tenderness. Psychiatric: Patient has a normal mood and affect. behavior is normal. Judgment and thought content normal. Muscular Skeletal: some swelling on right knee, scar is well healed. Good rom of right knee, positive for  increase in warmth no redness.   Recent Results (from the past 2160 hour(s))  Prepare RBC     Status: None   Collection Time: 04/18/15 10:30 AM  Result Value Ref Range   Order Confirmation ORDER PROCESSED BY BLOOD BANK   APTT     Status: None   Collection Time: 04/18/15 10:49 AM  Result Value Ref Range   aPTT 30 24 - 36 seconds  Protime-INR     Status: None   Collection Time: 04/18/15 10:49 AM  Result Value Ref Range   Prothrombin Time 12.8 11.4 - 15.0 seconds   INR 0.94   Potassium     Status: Abnormal   Collection Time: 04/18/15 10:49 AM  Result  Value Ref Range   Potassium 3.3 (L) 3.5 - 5.1 mmol/L  Type and screen for Red Blood Exchange     Status: None   Collection Time: 04/18/15 10:49 AM  Result Value Ref Range   ABO/RH(D) O POS    Antibody Screen NEG    Sample Expiration 05/02/2015    Unit Number [id number]    Blood Component Type RED CELLS,LR    Unit division 00    Status of Unit REL FROM [facility]    Transfusion Status OK TO TRANSFUSE    Crossmatch Result Compatible    Unit Number [id number]    Blood Component Type RED CELLS,LR    Unit division 00    Status of Unit REL FROM [facility]    Transfusion Status OK TO TRANSFUSE    Crossmatch Result Compatible   Urinalysis complete, with microscopic ([facility] only)     Status: Abnormal   Collection Time: 04/18/15 10:50 AM  Result Value Ref Range   Color, Urine STRAW (A) YELLOW   APPearance CLEAR (A) CLEAR   Glucose, UA NEGATIVE NEGATIVE mg/dL   Bilirubin Urine NEGATIVE NEGATIVE   Ketones, ur NEGATIVE NEGATIVE mg/dL   Specific Gravity, Urine 1.012 1.005 - 1.030   Hgb urine dipstick NEGATIVE NEGATIVE   pH 6.0 5.0 - 8.0   Protein, ur NEGATIVE NEGATIVE mg/dL   Nitrite NEGATIVE NEGATIVE   Leukocytes, UA NEGATIVE NEGATIVE   RBC / HPF NONE SEEN 0 - 5 RBC/hpf   WBC, UA 0-5 0 - 5 WBC/hpf   Bacteria, UA NONE SEEN NONE SEEN   Squamous Epithelial / LPF 0-5 (A) NONE SEEN   Mucous PRESENT   Surgical pcr screen     Status: None    Collection Time: 04/18/15 10:50 AM  Result Value Ref Range   MRSA, PCR NEGATIVE NEGATIVE   Staphylococcus aureus NEGATIVE NEGATIVE    Comment:        The Xpert SA Assay (FDA approved for NASAL specimens in patients over 22 years of age), is one component of a comprehensive surveillance program.  Test performance has been validated by [facility] for patients greater than or equal to 93 year old. It is not intended to diagnose infection nor to guide or monitor treatment.   ABO/Rh     Status: None   Collection Time: 04/18/15 10:51 AM  Result Value Ref Range   ABO/RH(D) O POS   Potassium     Status: None   Collection Time: 04/29/15 12:00 AM  Result Value Ref Range   Potassium, serum 3.8   Potassium     Status: None   Collection Time: 04/29/15 12:39 PM  Result Value Ref Range   Potassium 4.1 3.5 - 5.1 mmol/L  CBC     Status: Abnormal   Collection Time: 04/29/15 12:39 PM  Result Value Ref Range   WBC 17.1 (H) 3.6 - 11.0 K/uL   RBC 4.14 3.80 - 5.20 MIL/uL   Hemoglobin 11.2 (L) 12.0 - 16.0 g/dL   HCT 35.5 35.0 - 47.0 %   MCV 85.7 80.0 - 100.0 fL   MCH 27.1 26.0 - 34.0 pg   MCHC 31.6 (L) 32.0 - 36.0 g/dL   RDW 12.9 11.5 - 14.5 %   Platelets 250 150 - 440 K/uL  Creatinine, serum     Status: Abnormal   Collection Time: 04/29/15 12:39 PM  Result Value Ref Range   Creatinine, Ser 1.04 (H) 0.44 - 1.00 mg/dL   GFR calc non Af Wyvonnia Lora  54 (L) >60 mL/min   GFR calc Af Amer >60 >60 mL/min    Comment: (NOTE) The eGFR has been calculated using the CKD EPI equation. This calculation has not been validated in all clinical situations. eGFR's persistently <60 mL/min signify possible Chronic Kidney Disease.   Basic metabolic panel     Status: Abnormal   Collection Time: 04/30/15 10:16 AM  Result Value Ref Range   Sodium 137 135 - 145 mmol/L   Potassium 4.3 3.5 - 5.1 mmol/L   Chloride 101 101 - 111 mmol/L   CO2 25 22 - 32 mmol/L   Glucose, Bld 160 (H) 65 - 99 mg/dL   BUN 12 6 - 20  mg/dL   Creatinine, Ser 1.02 (H) 0.44 - 1.00 mg/dL   Calcium 8.7 (L) 8.9 - 10.3 mg/dL   GFR calc non Af Amer 55 (L) >60 mL/min   GFR calc Af Amer >60 >60 mL/min    Comment: (NOTE) The eGFR has been calculated using the CKD EPI equation. This calculation has not been validated in all clinical situations. eGFR's persistently <60 mL/min signify possible Chronic Kidney Disease.    Anion gap 11 5 - 15  Basic metabolic panel     Status: Abnormal   Collection Time: 05/01/15  4:25 AM  Result Value Ref Range   Sodium 134 (L) 135 - 145 mmol/L   Potassium 3.9 3.5 - 5.1 mmol/L   Chloride 97 (L) 101 - 111 mmol/L   CO2 30 22 - 32 mmol/L   Glucose, Bld 145 (H) 65 - 99 mg/dL   BUN 11 6 - 20 mg/dL   Creatinine, Ser 0.90 0.44 - 1.00 mg/dL   Calcium 8.7 (L) 8.9 - 10.3 mg/dL   GFR calc non Af Amer >60 >60 mL/min   GFR calc Af Amer >60 >60 mL/min    Comment: (NOTE) The eGFR has been calculated using the CKD EPI equation. This calculation has not been validated in all clinical situations. eGFR's persistently <60 mL/min signify possible Chronic Kidney Disease.    Anion gap 7 5 - 15      PHQ2/9: Depression screen PHQ 2/9 07/09/2015  Decreased Interest 0  Down, Depressed, Hopeless 0  PHQ - 2 Score 0    Fall Risk: Fall Risk  07/09/2015  Falls in the past year? No     Assessment & Plan  8. Needs flu shot  - Flu vaccine HIGH DOSE PF

## 2015-07-10 ENCOUNTER — Encounter: Payer: Self-pay | Admitting: Cardiovascular Disease

## 2015-07-10 ENCOUNTER — Ambulatory Visit (INDEPENDENT_AMBULATORY_CARE_PROVIDER_SITE_OTHER): Payer: Medicare PPO | Admitting: Cardiovascular Disease

## 2015-07-10 VITALS — BP 130/64 | HR 64 | Ht 63.0 in | Wt 174.0 lb

## 2015-07-10 DIAGNOSIS — E785 Hyperlipidemia, unspecified: Secondary | ICD-10-CM | POA: Diagnosis not present

## 2015-07-10 DIAGNOSIS — I10 Essential (primary) hypertension: Secondary | ICD-10-CM

## 2015-07-10 DIAGNOSIS — I2510 Atherosclerotic heart disease of native coronary artery without angina pectoris: Secondary | ICD-10-CM | POA: Diagnosis not present

## 2015-07-10 DIAGNOSIS — I209 Angina pectoris, unspecified: Secondary | ICD-10-CM

## 2015-07-10 DIAGNOSIS — I2583 Coronary atherosclerosis due to lipid rich plaque: Principal | ICD-10-CM

## 2015-07-10 NOTE — Assessment & Plan Note (Signed)
Unable to tolerate any statins even at minimal dosing secondary to cramping per the patient. Recommend she consider Zetia when this goes generic

## 2015-07-10 NOTE — Progress Notes (Signed)
Patient ID: Jodi Kirby, female    DOB: 1946/07/11, 69 y.o.   MRN: [id number]  HPI Comments: Jodi Kirby is a very pleasant 69 year old woman with a history of coronary  arterial disease on catheterization in 2003 with 20% disease in her RCA and LAD, history of hyperlipidemia, back surgery, obstructive sleep apnea on CPAP, patient of Dr. Carlynn Purl who previously presented for symptoms of chest pressure. She presents for routine followup of her CAD, hyperlipidemia  In follow-up today, she reports her weight is higher Recent total knee replacement on the right by Dr. Martha Clan of statins even very low dose Crestor. Also reports having tried several other statins. Each of these caused cramping Review of lab work shows glucose levels were elevated in June 2016 in the 140 up to 160 range Sedentary, no regular exercise program Denies any chest pain. Chronic back, knee, ankle pain  EKG on today's visit shows normal sinus rhythm with rate 64 bpm, rare APC  Other past medical history Stress test in 2014 showing no ischemia   Allergies  Allergen Reactions  . Niaspan [Niacin Er]   . Oxycodone-Acetaminophen Nausea Only  . Statins     Current Outpatient Prescriptions on File Prior to Visit  Medication Sig Dispense Refill  . acetaminophen (TYLENOL) 325 MG tablet Take 2 tablets (650 mg total) by mouth every 6 (six) hours as needed for mild pain (or Fever >/= 101). 30 tablet 0  . Alendronate Sodium (FOSAMAX PO) Take 1 tablet by mouth once a week.    . Amlodipine-Valsartan-HCTZ 5-160-25 MG TABS Take 1 tablet by mouth daily.    Marland Kitchen amoxicillin (AMOXIL) 500 MG capsule Take 4 capsules (2,000 mg total) by mouth daily as needed (one hour prior to dental procedure). 12 capsule 0  . aspirin 81 MG tablet Take 81 mg by mouth daily.    . Cholecalciferol (VITAMIN D3) 1000 UNITS CAPS Take 1 capsule by mouth daily.    Marland Kitchen EPINEPHrine (EPIPEN 2-PAK) 0.3 mg/0.3 mL DEVI Inject 0.3 mg into the muscle as  needed.     . fluticasone (FLONASE) 50 MCG/ACT nasal spray Place 2 sprays into the nose daily.    Marland Kitchen loratadine (CLARITIN) 10 MG tablet Take 1 tablet (10 mg total) by mouth daily. 30 tablet 5  . Multiple Vitamins-Minerals (CENTRUM SILVER PO) Take by mouth daily.    . nitroGLYCERIN (NITROSTAT) 0.4 MG SL tablet Place 1 tablet (0.4 mg total) under the tongue every 5 (five) minutes as needed for chest pain. 25 tablet 6  . omeprazole (PRILOSEC) 40 MG capsule Take 40 mg by mouth 2 (two) times daily.     No current facility-administered medications on file prior to visit.    Past Medical History  Diagnosis Date  . Hyperlipidemia   . Coronary artery disease   . Obstructive sleep apnea   . Esophageal reflux   . Essential hypertension, benign   . Dysmetabolic syndrome X   . Other abnormal glucose   . Toxic effect of venom(989.5)   . Allergic rhinitis, cause unspecified   . Lumbago   . Osteoarthrosis, unspecified whether generalized or localized, lower leg   . Lump or mass in breast   . Dysuria   . 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 Unspecified iridocyclitis   . Unspecified vitamin D deficiency   . Other and unspecified angina pectoris   . Myalgia and myositis, unspecified     Past Surgical History  Procedure Laterality Date  . Knee surgery  08/18/2011    arthroscopic right  . Lumbar fusion      L4-5, S-1  . Cystostomy    . Cardiac catheterization  2003    [facility]  . Cardiac catheterization      [facility]  . Neck surgery  2003  . Ankle surgery Right 1980  . Colonoscopy  2008, 2015    Dr. Servando Snare  . Tubal ligation    . Abdominal hysterectomy  1970  . Bladder surgery  2012  . Total knee arthroplasty Right 04/29/2015    Procedure: TOTAL KNEE ARTHROPLASTY;  Surgeon: Erin Sons, MD;  Location: [facility] ORS;  Service: Orthopedics;  Laterality: Right;    Social History  reports that she has never smoked. She has never used  smokeless tobacco. She reports that she does not drink alcohol or use illicit drugs.  Family History Family history is unknown by patient.  Review of Systems  Constitutional: Negative.   Respiratory: Negative.   Cardiovascular: Negative.   Gastrointestinal: Negative.   Musculoskeletal: Negative.   Neurological: Negative.   Hematological: Negative.   Psychiatric/Behavioral: Negative.   All other systems reviewed and are negative.   BP 130/64 mmHg  Pulse 64  Ht  (1.6 m)  Wt 174 lb (78.926 kg)  BMI 30.83 kg/m2  Physical Exam  Constitutional: She is oriented to person, place, and time. She appears well-developed and well-nourished.  HENT:  Head: Normocephalic.  Nose: Nose normal.  Mouth/Throat: Oropharynx is clear and moist.  Eyes: Conjunctivae are normal. Pupils are equal, round, and reactive to light.  Neck: Normal range of motion. Neck supple. No JVD present.  Cardiovascular: Normal rate, regular rhythm, S1 normal, S2 normal, normal heart sounds and intact distal pulses.  Exam reveals no gallop and no friction rub.   No murmur heard. Pulmonary/Chest: Effort normal and breath sounds normal. No respiratory distress. She has no wheezes. She has no rales. She exhibits no tenderness.  Abdominal: Soft. Bowel sounds are normal. She exhibits no distension. There is no tenderness.  Musculoskeletal: Normal range of motion. She exhibits no edema or tenderness.  Lymphadenopathy:    She has no cervical adenopathy.  Neurological: She is alert and oriented to person, place, and time. Coordination normal.  Skin: Skin is warm and dry. No rash noted. No erythema.  Psychiatric: She has a normal mood and affect. Her behavior is normal. Judgment and thought content normal.    Assessment and Plan  Nursing note and vitals reviewed.

## 2015-07-10 NOTE — Assessment & Plan Note (Signed)
Blood pressure is well controlled on today's visit. No changes made to the medications. 

## 2015-07-10 NOTE — Assessment & Plan Note (Signed)
Known mild coronary artery disease, currently with no symptoms of unstable angina She does have nitroglycerin if needed for chest pain symptoms. Is not been using Blood pressure well controlled

## 2015-07-10 NOTE — Patient Instructions (Signed)
You are doing well. No medication changes were made.  Ask Dr. Carlynn Purl about Zetia  Please call us if you have new issues that need to be addressed before your next appt.  Your physician wants you to follow-up in: 12 months.  You will receive a reminder letter in the mail two months in advance. If you don't receive a letter, please call our office to schedule the follow-up appointment.

## 2015-07-10 NOTE — Assessment & Plan Note (Signed)
Currently with no symptoms of angina. No further workup at this time. Unable to tolerate statins. Could try zetia early next year when this goes generic

## 2015-07-18 DIAGNOSIS — G4733 Obstructive sleep apnea (adult) (pediatric): Secondary | ICD-10-CM | POA: Diagnosis not present

## 2015-07-20 DIAGNOSIS — G4733 Obstructive sleep apnea (adult) (pediatric): Secondary | ICD-10-CM | POA: Diagnosis not present

## 2015-07-21 DIAGNOSIS — G4733 Obstructive sleep apnea (adult) (pediatric): Secondary | ICD-10-CM | POA: Diagnosis not present

## 2015-07-24 ENCOUNTER — Other Ambulatory Visit: Payer: Self-pay | Admitting: Family Medicine

## 2015-07-24 NOTE — Telephone Encounter (Signed)
Patient requesting refill. 

## 2015-08-07 DIAGNOSIS — Z96651 Presence of right artificial knee joint: Secondary | ICD-10-CM | POA: Diagnosis not present

## 2015-08-20 DIAGNOSIS — G4733 Obstructive sleep apnea (adult) (pediatric): Secondary | ICD-10-CM | POA: Diagnosis not present

## 2015-09-20 DIAGNOSIS — G4733 Obstructive sleep apnea (adult) (pediatric): Secondary | ICD-10-CM | POA: Diagnosis not present

## 2015-09-23 ENCOUNTER — Encounter: Payer: Self-pay | Admitting: General Surgery

## 2015-09-23 DIAGNOSIS — Z1231 Encounter for screening mammogram for malignant neoplasm of breast: Secondary | ICD-10-CM | POA: Diagnosis not present

## 2015-10-01 ENCOUNTER — Ambulatory Visit (INDEPENDENT_AMBULATORY_CARE_PROVIDER_SITE_OTHER): Payer: Medicare PPO | Admitting: General Surgery

## 2015-10-01 ENCOUNTER — Encounter: Payer: Self-pay | Admitting: General Surgery

## 2015-10-01 VITALS — BP 124/72 | HR 76 | Resp 14 | Ht 63.0 in | Wt 167.0 lb

## 2015-10-01 DIAGNOSIS — Z86010 Personal history of colonic polyps: Secondary | ICD-10-CM | POA: Diagnosis not present

## 2015-10-01 DIAGNOSIS — N6019 Diffuse cystic mastopathy of unspecified breast: Secondary | ICD-10-CM

## 2015-10-01 NOTE — Progress Notes (Signed)
Patient ID: Jodi Kirby, female   DOB: 1946-06-20, 69 y.o.   MRN: [id number]  Chief Complaint  Patient presents with  . Follow-up    mammogram    HPI Jodi Kirby is a 69 y.o. female.  who presents for a breast evaluation. The most recent mammogram was done on 09-23-15.  Patient does perform regular self breast checks and gets regular mammograms done.   Patient had a right total knee replacement in June of this year.  I have reviewed the history of present illness with the patient.  HPI  Past Medical History  Diagnosis Date  . Hyperlipidemia   . Coronary artery disease   . Obstructive sleep apnea   . Esophageal reflux   . Essential hypertension, benign   . Dysmetabolic syndrome X   . Other abnormal glucose   . Toxic effect of venom(989.5)   . Allergic rhinitis, cause unspecified   . Lumbago   . Osteoarthrosis, unspecified whether generalized or localized, lower leg   . Lump or mass in breast   . Dysuria   . 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 Unspecified iridocyclitis   . Unspecified vitamin D deficiency   . Other and unspecified angina pectoris   . Myalgia and myositis, unspecified     Past Surgical History  Procedure Laterality Date  . Knee surgery  08/18/2011    arthroscopic right  . Lumbar fusion      L4-5, S-1  . Cystostomy    . Cardiac catheterization  2003    [facility]  . Cardiac catheterization      [facility]  . Neck surgery  2003  . Ankle surgery Right 1980  . Colonoscopy  2008, 2015    Dr. Servando Snare  . Tubal ligation    . Abdominal hysterectomy  1970  . Bladder surgery  2012  . Total knee arthroplasty Right 04/29/2015    Procedure: TOTAL KNEE ARTHROPLASTY;  Surgeon: Erin Sons, MD;  Location: [facility] ORS;  Service: Orthopedics;  Laterality: Right;    Family History  Problem Relation Age of Onset  . Family history unknown: Yes    Social History Social History  Substance Use Topics   . Smoking status: Never Smoker   . Smokeless tobacco: Never Used  . Alcohol Use: No    Allergies  Allergen Reactions  . Niaspan [Niacin Er]   . Oxycodone-Acetaminophen Nausea Only  . Statins     Current Outpatient Prescriptions  Medication Sig Dispense Refill  . albuterol (PROVENTIL HFA;VENTOLIN HFA) 108 (90 BASE) MCG/ACT inhaler Inhale into the lungs every 6 (six) hours as needed for wheezing or shortness of breath.    . Alendronate Sodium (FOSAMAX PO) Take 1 tablet by mouth once a week.    . ALPRAZolam (XANAX) 0.5 MG tablet Take 0.5 mg by mouth at bedtime as needed for anxiety.    . Amlodipine-Valsartan-HCTZ 5-160-25 MG TABS TAKE ONE TABLET BY MOUTH ONCE DAILY FOR  BLOOD  PRESSURE  IN  PLACE  OF  TRIBENZOR 90 tablet 0  . aspirin 81 MG tablet Take 81 mg by mouth daily.    . Cholecalciferol (VITAMIN D3) 1000 UNITS CAPS Take 1 capsule by mouth daily.    Marland Kitchen EPINEPHrine (EPIPEN 2-PAK) 0.3 mg/0.3 mL DEVI Inject 0.3 mg into the muscle as needed.     . fluticasone (FLONASE) 50 MCG/ACT nasal spray Place 2 sprays into the nose daily.    Marland Kitchen loratadine (CLARITIN) 10  MG tablet Take 1 tablet (10 mg total) by mouth daily. 30 tablet 5  . Multiple Vitamins-Minerals (CENTRUM SILVER PO) Take by mouth daily.    Marland Kitchen. omeprazole (PRILOSEC) 40 MG capsule Take 40 mg by mouth 2 (two) times daily.    . nitroGLYCERIN (NITROSTAT) 0.4 MG SL tablet Place 1 tablet (0.4 mg total) under the tongue every 5 (five) minutes as needed for chest pain. 25 tablet 6   No current facility-administered medications for this visit.    Review of Systems Review of Systems  Constitutional: Negative.   Respiratory: Negative.   Cardiovascular: Negative.     Blood pressure 124/72, pulse 76, resp. rate 14, height 5\' 3"  (1.6 m), weight 167 lb (75.751 kg).  Physical Exam Physical Exam  Constitutional: She is oriented to person, place, and time. She appears well-developed and well-nourished.  Eyes: Conjunctivae are normal. No  scleral icterus.  Neck: Neck supple.  Cardiovascular: Normal rate, regular rhythm and normal heart sounds.   Pulmonary/Chest: Effort normal and breath sounds normal. Right breast exhibits no inverted nipple, no mass, no nipple discharge, no skin change and no tenderness. Left breast exhibits no inverted nipple, no mass, no nipple discharge, no skin change and no tenderness.  Abdominal: Soft. Bowel sounds are normal. She exhibits no distension. There is no hepatomegaly. There is no tenderness.  Lymphadenopathy:    She has no cervical adenopathy.    She has no axillary adenopathy.  Neurological: She is alert and oriented to person, place, and time.  Skin: Skin is warm and dry.  Psychiatric: She has a normal mood and affect. Her behavior is normal.    Data Reviewed Mammogram reviewed  Assessment    Stable exam. History of prior breast mass, FCD. History of colon polyp.    Plan The patient has been asked to return to the office in one year with a bilateral screening mammogram.    PCP:  Lanette HampshireSowles   SANKAR,SEEPLAPUTHUR G 10/01/2015, 3:17 PM

## 2015-10-01 NOTE — Patient Instructions (Addendum)
The patient has been asked to return to the office in one year with a bilateral screening  Mammogram. Continue self breast exams. Call office for any new breast issues or concerns.

## 2015-10-09 ENCOUNTER — Other Ambulatory Visit: Payer: Self-pay | Admitting: Family Medicine

## 2015-10-11 DIAGNOSIS — G4733 Obstructive sleep apnea (adult) (pediatric): Secondary | ICD-10-CM | POA: Diagnosis not present

## 2015-10-20 DIAGNOSIS — G4733 Obstructive sleep apnea (adult) (pediatric): Secondary | ICD-10-CM | POA: Diagnosis not present

## 2015-11-07 DIAGNOSIS — Z96659 Presence of unspecified artificial knee joint: Secondary | ICD-10-CM | POA: Diagnosis not present

## 2015-11-08 ENCOUNTER — Encounter: Payer: Self-pay | Admitting: Family Medicine

## 2015-11-08 ENCOUNTER — Ambulatory Visit (INDEPENDENT_AMBULATORY_CARE_PROVIDER_SITE_OTHER): Payer: Medicare PPO | Admitting: Family Medicine

## 2015-11-08 VITALS — BP 116/68 | HR 52 | Temp 97.9°F | Resp 16 | Ht 63.0 in | Wt 170.5 lb

## 2015-11-08 DIAGNOSIS — K219 Gastro-esophageal reflux disease without esophagitis: Secondary | ICD-10-CM

## 2015-11-08 DIAGNOSIS — Z79899 Other long term (current) drug therapy: Secondary | ICD-10-CM | POA: Diagnosis not present

## 2015-11-08 DIAGNOSIS — G4733 Obstructive sleep apnea (adult) (pediatric): Secondary | ICD-10-CM | POA: Diagnosis not present

## 2015-11-08 DIAGNOSIS — I10 Essential (primary) hypertension: Secondary | ICD-10-CM

## 2015-11-08 DIAGNOSIS — F5104 Psychophysiologic insomnia: Secondary | ICD-10-CM | POA: Insufficient documentation

## 2015-11-08 DIAGNOSIS — G4700 Insomnia, unspecified: Secondary | ICD-10-CM

## 2015-11-08 DIAGNOSIS — I2510 Atherosclerotic heart disease of native coronary artery without angina pectoris: Secondary | ICD-10-CM

## 2015-11-08 DIAGNOSIS — I209 Angina pectoris, unspecified: Secondary | ICD-10-CM

## 2015-11-08 DIAGNOSIS — E785 Hyperlipidemia, unspecified: Secondary | ICD-10-CM | POA: Diagnosis not present

## 2015-11-08 DIAGNOSIS — R739 Hyperglycemia, unspecified: Secondary | ICD-10-CM | POA: Diagnosis not present

## 2015-11-08 DIAGNOSIS — Z96651 Presence of right artificial knee joint: Secondary | ICD-10-CM | POA: Diagnosis not present

## 2015-11-08 DIAGNOSIS — R001 Bradycardia, unspecified: Secondary | ICD-10-CM

## 2015-11-08 DIAGNOSIS — J069 Acute upper respiratory infection, unspecified: Secondary | ICD-10-CM | POA: Diagnosis not present

## 2015-11-08 DIAGNOSIS — I2583 Coronary atherosclerosis due to lipid rich plaque: Secondary | ICD-10-CM

## 2015-11-08 DIAGNOSIS — Z9989 Dependence on other enabling machines and devices: Principal | ICD-10-CM

## 2015-11-08 MED ORDER — TRAZODONE HCL 50 MG PO TABS: 25.0000 mg | ORAL_TABLET | Freq: Every evening | ORAL | Status: DC | PRN

## 2015-11-08 MED ORDER — EZETIMIBE 10 MG PO TABS: 10.0000 mg | ORAL_TABLET | Freq: Every day | ORAL | Status: DC

## 2015-11-08 MED ORDER — ALPRAZOLAM 0.5 MG PO TABS: 0.5000 mg | ORAL_TABLET | Freq: Every evening | ORAL | Status: DC | PRN

## 2015-11-08 NOTE — Progress Notes (Signed)
Name: Jodi Kirby   MRN: [id number]    DOB: Mar 29, 1946   Date:11/08/2015       Progress Note  Subjective  Chief Complaint  Chief Complaint  Patient presents with  . Sleep Apnea    patient is here for her 40-month follow up (OSA). patient is fasting in case blood work is needed.  . Sinusitis    patient states that she has been having lots of head and facial pressure. patient has some post nasal drainage.     HPI  OSA: using CPAP every night, she keeps it on for about 7 hours per night, waking up feeling rested  Insomnia: she has a long history of interrupted sleep, falling asleep with alprazolam but waking at least twice during the night, and it takes her up to one hour to fall back asleep. She tries to relax but is unable to fall back asleep.   Knee replacement surgery right: surgery was June 27 th, 2016, she has completed her PT and flexion to 128 degrees, extension 180 degrees, she is still doing her exercises at home and is back at the gym three times weekly.   HTN: taking medication as prescribed, denies side effects of medication, bp is at goal. She denies any recent episodes of chest pain or SOB or palpitation   Hyperlipidemia: unable to tolerate statins, even in a low dose of 2.5 mg of Crestor, muscles aches were severe and she stopped medication. Dr. Mariah Milling advised Zetia when it is generic   GERD: under control with Omeprazole daily and denies side effects. No heartburn, no regurgitation  URI: symptoms of facial pressure and congestion started a few days ago, no fever, no change in appetite, no drainage. Denies ear pain or sore throat.   Bradycardia: present for years, no palpitation, syncope, chest pain  Angina Pectoris: she has CAD, mild disease, seeing Dr. Mariah Milling, no episodes of chest pain over the past few months, good exercise tolerance. Taking aspirin and ARB, not on beta-blocker because of bradycardia and not on statin because of intolerance.  Patient Active  Problem List   Diagnosis Date Noted  . Insomnia 11/08/2015  . Angina pectoris (HCC) 07/10/2015  . Seasonal allergic rhinitis 07/09/2015  . GERD (gastroesophageal reflux disease) 07/09/2015  . Bee sting allergy 07/09/2015  . History of knee replacement procedure of right knee 04/29/2015  . History of colonic polyps 09/24/2014  . Fibrocystic breast 09/24/2014  . Arthritis of knee, degenerative 06/12/2014  . Hyperlipidemia 02/09/2013  . Coronary artery disease 02/09/2013  . Obstructive sleep apnea on CPAP 02/09/2013  . Essential hypertension 02/09/2013    Past Surgical History  Procedure Laterality Date  . Knee surgery  08/18/2011    arthroscopic right  . Lumbar fusion      L4-5, S-1  . Cystostomy    . Cardiac catheterization  2003    [facility]  . Cardiac catheterization      [facility]  . Neck surgery  2003  . Ankle surgery Right 1980  . Colonoscopy  2008, 2015    Dr. Servando Snare  . Tubal ligation    . Abdominal hysterectomy  1970  . Bladder surgery  2012  . Total knee arthroplasty Right 04/29/2015    Procedure: TOTAL KNEE ARTHROPLASTY;  Surgeon: Erin Sons, MD;  Location: [facility] ORS;  Service: Orthopedics;  Laterality: Right;    Family History  Problem Relation Age of Onset  . Family history unknown: Yes    Social History   Social History  .  Marital Status: Married    Spouse Name: N/A  . Number of Children: N/A  . Years of Education: N/A   Occupational History  . Not on file.   Social History Main Topics  . Smoking status: Never Smoker   . Smokeless tobacco: Never Used  . Alcohol Use: No  . Drug Use: No  . Sexual Activity: Yes   Other Topics Concern  . Not on file   Social History Narrative     Current outpatient prescriptions:  .  albuterol (PROVENTIL HFA;VENTOLIN HFA) 108 (90 BASE) MCG/ACT inhaler, Inhale into the lungs every 6 (six) hours as needed for wheezing or shortness of breath., Disp: , Rfl:  .  Alendronate Sodium (FOSAMAX PO), Take 1 tablet by  mouth once a week., Disp: , Rfl:  .  ALPRAZolam (XANAX) 0.5 MG tablet, Take 1 tablet (0.5 mg total) by mouth at bedtime as needed for anxiety., Disp: 30 tablet, Rfl: 0 .  Amlodipine-Valsartan-HCTZ 5-160-25 MG TABS, TAKE ONE TABLET BY MOUTH ONCE DAILY FOR BLOOD PRESSURE INPLACE  OF  TRIBENZOR., Disp: 90 tablet, Rfl: 0 .  aspirin 81 MG tablet, Take 81 mg by mouth daily., Disp: , Rfl:  .  Cholecalciferol (VITAMIN D3) 1000 UNITS CAPS, Take 1 capsule by mouth daily., Disp: , Rfl:  .  EPINEPHrine (EPIPEN 2-PAK) 0.3 mg/0.3 mL DEVI, Inject 0.3 mg into the muscle as needed. , Disp: , Rfl:  .  fluticasone (FLONASE) 50 MCG/ACT nasal spray, USE TWO SPRAY(S) IN EACH NOSTRIL AT BEDTIME, Disp: 48 g, Rfl: 0 .  loratadine (CLARITIN) 10 MG tablet, Take 1 tablet (10 mg total) by mouth daily., Disp: 30 tablet, Rfl: 5 .  Multiple Vitamins-Minerals (CENTRUM SILVER PO), Take by mouth daily., Disp: , Rfl:  .  nitroGLYCERIN (NITROSTAT) 0.4 MG SL tablet, Place 1 tablet (0.4 mg total) under the tongue every 5 (five) minutes as needed for chest pain., Disp: 25 tablet, Rfl: 6 .  omeprazole (PRILOSEC) 40 MG capsule, Take 40 mg by mouth 2 (two) times daily., Disp: , Rfl:  .  ezetimibe (ZETIA) 10 MG tablet, Take 1 tablet (10 mg total) by mouth daily., Disp: 90 tablet, Rfl: 1 .  traZODone (DESYREL) 50 MG tablet, Take 0.5-2 tablets (25-100 mg total) by mouth at bedtime as needed for sleep., Disp: 60 tablet, Rfl: 2  Allergies  Allergen Reactions  . Niaspan [Niacin Er]   . Oxycodone-Acetaminophen Nausea Only  . Statins      ROS  Constitutional: Negative for fever or significant  weight change.  Respiratory: Negative for cough and shortness of breath.   Cardiovascular: Negative for chest pain or palpitations.  Gastrointestinal: Negative for abdominal pain, no bowel changes.  Musculoskeletal: Negative for gait problem or joint swelling.  Skin: Negative for rash.  Neurological: Negative for dizziness or headache.  No other  specific complaints in a complete review of systems (except as listed in HPI above).   Objective  Filed Vitals:   11/08/15 0953  BP: 116/68  Pulse: 52  Temp: 97.9 F (36.6 C)  TempSrc: Oral  Resp: 16  Height: 5\' 3"  (1.6 m)  Weight: 170 lb 8 oz (77.338 kg)  SpO2: 98%    Body mass index is 30.21 kg/(m^2).  Physical Exam  Constitutional: Patient appears well-developed and well-nourished. Obese No distress.  HEENT: head atraumatic, normocephalic, pupils equal and reactive to light, ears TM normal , neck supple, throat within normal limits Cardiovascular: Normal rate, regular rhythm and normal heart sounds.  No  murmur heard. No BLE edema. Pulmonary/Chest: Effort normal and breath sounds normal. No respiratory distress. Abdominal: Soft.  There is no tenderness. Psychiatric: Patient has a normal mood and affect. behavior is normal. Judgment and thought content normal. Muscular Skeletal: mild pain with extension of right knee, no effusion  PHQ2/9: Depression screen [facility] 2/9 11/08/2015 07/09/2015  Decreased Interest 0 0  Down, Depressed, Hopeless 0 0  PHQ - 2 Score 0 0    Fall Risk: Fall Risk  11/08/2015 07/09/2015  Falls in the past year? No No    Functional Status Survey: Is the patient deaf or have difficulty hearing?: No Does the patient have difficulty seeing, even when wearing glasses/contacts?: No Does the patient have difficulty concentrating, remembering, or making decisions?: No Does the patient have difficulty walking or climbing stairs?: No Does the patient have difficulty dressing or bathing?: No Does the patient have difficulty doing errands alone such as visiting a doctor's office or shopping?: No    Assessment & Plan  1. Obstructive sleep apnea on CPAP  Continue compliance with CPAP  2. Essential hypertension  At goal - Estimated GFR  3. Hyperlipidemia  We will give rx of Zetia, and recheck labs - ezetimibe (ZETIA) 10 MG tablet; Take 1 tablet (10 mg  total) by mouth daily.  Dispense: 90 tablet; Refill: 1 - Lipid panel  4. Gastroesophageal reflux disease without esophagitis  Doing well on medication   5. Coronary artery disease due to lipid rich plaque  Continue aspirin, try Zetia  6. History of knee replacement procedure of right knee  Doing well, right quad still a little swollen when compared to left side  7. Angina pectoris (HCC)  Doing well, symptoms free for months  8. Insomnia  We will try Trazodone, discussed importance of starting low and going up on the dose slowly, and possible side effects - ALPRAZolam (XANAX) 0.5 MG tablet; Take 1 tablet (0.5 mg total) by mouth at bedtime as needed for anxiety.  Dispense: 30 tablet; Refill: 0 - traZODone (DESYREL) 50 MG tablet; Take 0.5-2 tablets (25-100 mg total) by mouth at bedtime as needed for sleep.  Dispense: 60 tablet; Refill: 2  9. Upper respiratory infection  Advised otc mucinex and saline spray for now  10. Hyperglycemia  - Hemoglobin A1c  11. Long-term use of high-risk medication  - AST - ALT - Estimated GFR  12. Bradycardia  stable

## 2015-11-09 LAB — LIPID PANEL
CHOLESTEROL TOTAL: 211 mg/dL — AB (ref 100–199)
Chol/HDL Ratio: 4.4 ratio units (ref 0.0–4.4)
HDL: 48 mg/dL (ref >39)
LDL Calculated: 123 mg/dL — ABNORMAL HIGH (ref 0–99)
Triglycerides: 202 mg/dL — ABNORMAL HIGH (ref 0–149)
VLDL CHOLESTEROL CAL: 40 mg/dL (ref 5–40)

## 2015-11-09 LAB — GLOM FILT RATE, ESTIMATED
CREATININE: 0.92 mg/dL (ref 0.57–1.00)
GFR calc Af Amer: 73 mL/min/{1.73_m2} (ref >59)
GFR, EST NON AFRICAN AMERICAN: 63 mL/min/{1.73_m2} (ref >59)

## 2015-11-09 LAB — HEMOGLOBIN A1C
Est. average glucose Bld gHb Est-mCnc: 111 mg/dL
Hgb A1c MFr Bld: 5.5 % (ref 4.8–5.6)

## 2015-11-09 LAB — ALT: ALT: 17 IU/L (ref 0–32)

## 2015-11-09 LAB — AST: AST: 14 IU/L (ref 0–40)

## 2015-11-12 ENCOUNTER — Telehealth: Payer: Self-pay

## 2015-11-12 NOTE — Telephone Encounter (Signed)
Patient returned my call and her labs were reviewed. Patient stated that she has not been able to get to the pharmacy to see how much the Zetia cost, so I told her to let us know if it is too expensive.

## 2015-11-19 ENCOUNTER — Telehealth: Payer: Self-pay | Admitting: *Deleted

## 2015-11-19 NOTE — Telephone Encounter (Signed)
Called patient back and gave her the information and she will put the check in the mail.

## 2015-11-19 NOTE — Telephone Encounter (Signed)
Patient called and stated that she got a bill in the mail for DOS 10/01/15 and it showed only filled to [facility]. She also has [facility] and was just wanting to know why that was not filled to [facility].

## 2015-11-20 DIAGNOSIS — G4733 Obstructive sleep apnea (adult) (pediatric): Secondary | ICD-10-CM | POA: Diagnosis not present

## 2015-12-09 ENCOUNTER — Encounter: Payer: Self-pay | Admitting: Family Medicine

## 2015-12-09 ENCOUNTER — Ambulatory Visit (INDEPENDENT_AMBULATORY_CARE_PROVIDER_SITE_OTHER): Payer: Medicare PPO | Admitting: Family Medicine

## 2015-12-09 VITALS — BP 118/74 | HR 76 | Temp 97.6°F | Resp 16 | Ht 63.0 in | Wt 172.9 lb

## 2015-12-09 DIAGNOSIS — R1013 Epigastric pain: Secondary | ICD-10-CM | POA: Diagnosis not present

## 2015-12-09 MED ORDER — RANITIDINE HCL 150 MG PO TABS: 150.0000 mg | ORAL_TABLET | Freq: Two times a day (BID) | ORAL | Status: DC

## 2015-12-09 NOTE — Progress Notes (Signed)
Name: Jodi Kirby   MRN: [id number]    DOB: 09-02-1946   Date:12/09/2015       Progress Note  Subjective  Chief Complaint  Chief Complaint  Patient presents with  . Abdominal Pain    patient stated that it stays cold and it burns with pain on the right side. patient stated that it feels like it is rubbing something. no nausea, no vomitting, no changes in bowel movement.    HPI  Dyspepsia: she states that over the past few weeks she has noticed a constant burning one epigastric area and sometimes periumbilical area/and it feels cold, she also feels bloated. No nausea, or change in bowel movement. Appetite has been normal. No fever, no chills. No previous history of h. Pylori, but she has a history of gastritis. No blood in stools. She takes Omeprazole 40 mg twice daily. She states that Tums seems to improve symptoms temporarily. Symptoms with orange juice and when she drinks green tea in am.    Patient Active Problem List   Diagnosis Date Noted  . Insomnia 11/08/2015  . Angina pectoris (HCC) 07/10/2015  . Seasonal allergic rhinitis 07/09/2015  . GERD (gastroesophageal reflux disease) 07/09/2015  . Bee sting allergy 07/09/2015  . History of knee replacement procedure of right knee 04/29/2015  . History of colonic polyps 09/24/2014  . Fibrocystic breast 09/24/2014  . Arthritis of knee, degenerative 06/12/2014  . Hyperlipidemia 02/09/2013  . Coronary artery disease 02/09/2013  . Obstructive sleep apnea on CPAP 02/09/2013  . Essential hypertension 02/09/2013    Past Surgical History  Procedure Laterality Date  . Knee surgery  08/18/2011    arthroscopic right  . Lumbar fusion      L4-5, S-1  . Cystostomy    . Cardiac catheterization  2003    [facility]  . Cardiac catheterization      [facility]  . Neck surgery  2003  . Ankle surgery Right 1980  . Colonoscopy  2008, 2015    Dr. Servando Snare  . Tubal ligation    . Abdominal hysterectomy  1970  . Bladder surgery  2012  . Total  knee arthroplasty Right 04/29/2015    Procedure: TOTAL KNEE ARTHROPLASTY;  Surgeon: Erin Sons, MD;  Location: [facility] ORS;  Service: Orthopedics;  Laterality: Right;    Family History  Problem Relation Age of Onset  . Family history unknown: Yes    Social History   Social History  . Marital Status: Married    Spouse Name: N/A  . Number of Children: N/A  . Years of Education: N/A   Occupational History  . Not on file.   Social History Main Topics  . Smoking status: Never Smoker   . Smokeless tobacco: Never Used  . Alcohol Use: No  . Drug Use: No  . Sexual Activity: Yes   Other Topics Concern  . Not on file   Social History Narrative     Current outpatient prescriptions:  .  albuterol (PROVENTIL HFA;VENTOLIN HFA) 108 (90 BASE) MCG/ACT inhaler, Inhale into the lungs every 6 (six) hours as needed for wheezing or shortness of breath., Disp: , Rfl:  .  Alendronate Sodium (FOSAMAX PO), Take 1 tablet by mouth once a week., Disp: , Rfl:  .  ALPRAZolam (XANAX) 0.5 MG tablet, Take 1 tablet (0.5 mg total) by mouth at bedtime as needed for anxiety., Disp: 30 tablet, Rfl: 0 .  Amlodipine-Valsartan-HCTZ 5-160-25 MG TABS, TAKE ONE TABLET BY MOUTH ONCE DAILY FOR BLOOD PRESSURE INPLACE  OF  TRIBENZOR., Disp: 90 tablet, Rfl: 0 .  aspirin 81 MG tablet, Take 81 mg by mouth daily., Disp: , Rfl:  .  Cholecalciferol (VITAMIN D3) 1000 UNITS CAPS, Take 1 capsule by mouth daily., Disp: , Rfl:  .  EPINEPHrine (EPIPEN 2-PAK) 0.3 mg/0.3 mL DEVI, Inject 0.3 mg into the muscle as needed. , Disp: , Rfl:  .  ezetimibe (ZETIA) 10 MG tablet, Take 1 tablet (10 mg total) by mouth daily., Disp: 90 tablet, Rfl: 1 .  fluticasone (FLONASE) 50 MCG/ACT nasal spray, USE TWO SPRAY(S) IN EACH NOSTRIL AT BEDTIME, Disp: 48 g, Rfl: 0 .  loratadine (CLARITIN) 10 MG tablet, Take 1 tablet (10 mg total) by mouth daily., Disp: 30 tablet, Rfl: 5 .  Multiple Vitamins-Minerals (CENTRUM SILVER PO), Take by mouth daily., Disp: ,  Rfl:  .  nitroGLYCERIN (NITROSTAT) 0.4 MG SL tablet, Place 1 tablet (0.4 mg total) under the tongue every 5 (five) minutes as needed for chest pain., Disp: 25 tablet, Rfl: 6 .  omeprazole (PRILOSEC) 40 MG capsule, Take 40 mg by mouth 2 (two) times daily., Disp: , Rfl:  .  ranitidine (ZANTAC) 150 MG tablet, Take 1 tablet (150 mg total) by mouth 2 (two) times daily., Disp: 60 tablet, Rfl: 0 .  traZODone (DESYREL) 50 MG tablet, Take 0.5-2 tablets (25-100 mg total) by mouth at bedtime as needed for sleep., Disp: 60 tablet, Rfl: 2  Allergies  Allergen Reactions  . Niaspan [Niacin Er]   . Oxycodone-Acetaminophen Nausea Only  . Statins      ROS  Constitutional: Negative for fever or weight change.  Respiratory: Negative for cough and shortness of breath.   Cardiovascular: Negative for chest pain or palpitations.  Gastrointestinal: Positive for abdominal pain, no bowel changes.  Musculoskeletal: Negative for gait problem or joint swelling.  Skin: Negative for rash.  Neurological: Negative for dizziness or headache.  No other specific complaints in a complete review of systems (except as listed in HPI above).  Objective  Filed Vitals:   12/09/15 0823  BP: 118/74  Pulse: 76  Temp: 97.6 F (36.4 C)  TempSrc: Oral  Resp: 16  Height: 5\' 3"  (1.6 m)  Weight: 172 lb 14.4 oz (78.427 kg)  SpO2: 97%    Body mass index is 30.64 kg/(m^2).  Physical Exam  Constitutional: Patient appears well-developed and well-nourished. Obese  No distress.  HEENT: head atraumatic, normocephalic, pupils equal and reactive to light, neck supple, throat within normal limits Cardiovascular: Normal rate, regular rhythm and normal heart sounds.  No murmur heard. No BLE edema. Pulmonary/Chest: Effort normal and breath sounds normal. No respiratory distress. Abdominal: Soft.  There is no tenderness. Psychiatric: Patient has a normal mood and affect. behavior is normal. Judgment and thought content  normal.  Recent Results (from the past 2160 hour(s))  Lipid panel     Status: Abnormal   Collection Time: 11/08/15 11:01 AM  Result Value Ref Range   Cholesterol, Total 211 (H) 100 - 199 mg/dL   Triglycerides 657 (H) 0 - 149 mg/dL   HDL 48 >84 mg/dL   VLDL Cholesterol Cal 40 5 - 40 mg/dL   LDL Calculated 696 (H) 0 - 99 mg/dL   Chol/HDL Ratio 4.4 0.0 - 4.4 ratio units    Comment:                                   T. Chol/HDL  Ratio                                             Men  Women                               1/2 Avg.Risk  3.4    3.3                                   Avg.Risk  5.0    4.4                                2X Avg.Risk  9.6    7.1                                3X Avg.Risk 23.4   11.0   Hemoglobin A1c     Status: None   Collection Time: 11/08/15 11:01 AM  Result Value Ref Range   Hgb A1c MFr Bld 5.5 4.8 - 5.6 %    Comment:          Pre-diabetes: 5.7 - 6.4          Diabetes: >6.4          Glycemic control for adults with diabetes: <7.0    Est. average glucose Bld gHb Est-mCnc 111 mg/dL  AST     Status: None   Collection Time: 11/08/15 11:01 AM  Result Value Ref Range   AST 14 0 - 40 IU/L  ALT     Status: None   Collection Time: 11/08/15 11:01 AM  Result Value Ref Range   ALT 17 0 - 32 IU/L  Glom Filt Rate, Estimated     Status: None   Collection Time: 11/08/15 11:01 AM  Result Value Ref Range   Creatinine, Ser 0.92 0.57 - 1.00 mg/dL   GFR calc non Af Amer 63 >59 mL/min/1.73   GFR calc Af Amer 73 >59 mL/min/1.73     PHQ2/9: Depression screen [facility] 2/9 12/09/2015 11/08/2015 07/09/2015  Decreased Interest 0 0 0  Down, Depressed, Hopeless 0 0 0  PHQ - 2 Score 0 0 0     Fall Risk: Fall Risk  12/09/2015 11/08/2015 07/09/2015  Falls in the past year? No No No     Functional Status Survey: Is the patient deaf or have difficulty hearing?: No Does the patient have difficulty seeing, even when wearing glasses/contacts?: No Does the patient have difficulty  concentrating, remembering, or making decisions?: No Does the patient have difficulty walking or climbing stairs?: No Does the patient have difficulty dressing or bathing?: No Does the patient have difficulty doing errands alone such as visiting a doctor's office or shopping?: No    Assessment & Plan  1. Dyspepsia  Discussed GERD diet in the mean time. We will refer to GI if H. Pylori negative - H. pylori antibody, IgG - ranitidine (ZANTAC) 150 MG tablet; Take 1 tablet (150 mg total) by mouth 2 (two) times daily.  Dispense: 60 tablet; Refill: 0

## 2015-12-09 NOTE — Patient Instructions (Signed)
Food Choices for Gastroesophageal Reflux Disease, Adult When you have gastroesophageal reflux disease (GERD), the foods you eat and your eating habits are very important. Choosing the right foods can help ease the discomfort of GERD. WHAT GENERAL GUIDELINES DO I NEED TO FOLLOW?  Choose fruits, vegetables, whole grains, low-fat dairy products, and low-fat meat, fish, and poultry.  Limit fats such as oils, salad dressings, butter, nuts, and avocado.  Keep a food diary to identify foods that cause symptoms.  Avoid foods that cause reflux. These may be different for different people.  Eat frequent small meals instead of three large meals each day.  Eat your meals slowly, in a relaxed setting.  Limit fried foods.  Cook foods using methods other than frying.  Avoid drinking alcohol.  Avoid drinking large amounts of liquids with your meals.  Avoid bending over or lying down until 2-3 hours after eating. WHAT FOODS ARE NOT RECOMMENDED? The following are some foods and drinks that may worsen your symptoms: Vegetables Tomatoes. Tomato juice. Tomato and spaghetti sauce. Chili peppers. Onion and garlic. Horseradish. Fruits Oranges, grapefruit, and lemon (fruit and juice). Meats High-fat meats, fish, and poultry. This includes hot dogs, ribs, ham, sausage, salami, and bacon. Dairy Whole milk and chocolate milk. Sour cream. Cream. Butter. Ice cream. Cream cheese.  Beverages Coffee and tea, with or without caffeine. Carbonated beverages or energy drinks. Condiments Hot sauce. Barbecue sauce.  Sweets/Desserts Chocolate and cocoa. Donuts. Peppermint and spearmint. Fats and Oils High-fat foods, including French fries and potato chips. Other Vinegar. Strong spices, such as black pepper, white pepper, red pepper, cayenne, curry powder, cloves, ginger, and chili powder. The items listed above may not be a complete list of foods and beverages to avoid. Contact your dietitian for more  information.   This information is not intended to replace advice given to you by your health care provider. Make sure you discuss any questions you have with your health care provider.   Document Released: 10/19/2005 Document Revised: 11/09/2014 Document Reviewed: 08/23/2013 Elsevier Interactive Patient Education 2016 [employer]  

## 2015-12-10 ENCOUNTER — Other Ambulatory Visit: Payer: Self-pay | Admitting: Family Medicine

## 2015-12-10 DIAGNOSIS — R1013 Epigastric pain: Secondary | ICD-10-CM

## 2015-12-10 LAB — H. PYLORI ANTIBODY, IGG: H Pylori IgG: <0.9 U/mL (ref 0.0–0.8)

## 2015-12-21 DIAGNOSIS — G4733 Obstructive sleep apnea (adult) (pediatric): Secondary | ICD-10-CM | POA: Diagnosis not present

## 2016-01-02 ENCOUNTER — Encounter: Payer: Self-pay | Admitting: Gastroenterology

## 2016-01-02 ENCOUNTER — Ambulatory Visit (INDEPENDENT_AMBULATORY_CARE_PROVIDER_SITE_OTHER): Payer: Medicare PPO | Admitting: Gastroenterology

## 2016-01-02 VITALS — BP 139/66 | HR 71 | Temp 99.3°F | Ht 63.0 in | Wt 174.0 lb

## 2016-01-02 DIAGNOSIS — R109 Unspecified abdominal pain: Secondary | ICD-10-CM

## 2016-01-02 NOTE — Progress Notes (Signed)
Primary Care Physician: [name], MD  Primary Gastroenterologist:  Dr. Midge Minium  Chief Complaint  Patient presents with  . Abdominal Pain    HPI: Jodi Kirby is a 70 y.o. female here with a history of reflux that is controlled by her PPI and diet changes. The patient now comes in with a report of abdominal pain in the left and right side that she reports to be a burning sensation. The patient states that she had some knee injury back last year and started going to the gym more often last few months. The patient states that the abdominal pain is sometimes made worse with water but not associated with her bowel movements or eating. The patient also states that the pain can come at any time. Of any unexplained weight loss, fevers, chills, nausea or vomiting.  Current Outpatient Prescriptions  Medication Sig Dispense Refill  . albuterol (PROVENTIL HFA;VENTOLIN HFA) 108 (90 BASE) MCG/ACT inhaler Inhale into the lungs every 6 (six) hours as needed for wheezing or shortness of breath.    . Alendronate Sodium (FOSAMAX PO) Take 1 tablet by mouth once a week.    . ALPRAZolam (XANAX) 0.5 MG tablet Take 1 tablet (0.5 mg total) by mouth at bedtime as needed for anxiety. 30 tablet 0  . Amlodipine-Valsartan-HCTZ 5-160-25 MG TABS TAKE ONE TABLET BY MOUTH ONCE DAILY FOR BLOOD PRESSURE INPLACE  OF  TRIBENZOR. 90 tablet 0  . aspirin 81 MG tablet Take 81 mg by mouth daily.    . Cholecalciferol (VITAMIN D3) 1000 UNITS CAPS Take 1 capsule by mouth daily.    Marland Kitchen EPINEPHrine (EPIPEN 2-PAK) 0.3 mg/0.3 mL DEVI Inject 0.3 mg into the muscle as needed.     . fluticasone (FLONASE) 50 MCG/ACT nasal spray USE TWO SPRAY(S) IN EACH NOSTRIL AT BEDTIME 48 g 0  . loratadine (CLARITIN) 10 MG tablet Take 1 tablet (10 mg total) by mouth daily. 30 tablet 5  . Multiple Vitamins-Minerals (CENTRUM SILVER PO) Take by mouth daily.    . nitroGLYCERIN (NITROSTAT) 0.4 MG SL tablet Place 1 tablet (0.4 mg total) under the  tongue every 5 (five) minutes as needed for chest pain. 25 tablet 6  . omeprazole (PRILOSEC) 40 MG capsule Take 40 mg by mouth 2 (two) times daily.    . ranitidine (ZANTAC) 150 MG tablet Take 1 tablet (150 mg total) by mouth 2 (two) times daily. 60 tablet 0  . ezetimibe (ZETIA) 10 MG tablet Take 1 tablet (10 mg total) by mouth daily. (Patient not taking: Reported on 01/02/2016) 90 tablet 1  . traZODone (DESYREL) 50 MG tablet Take 0.5-2 tablets (25-100 mg total) by mouth at bedtime as needed for sleep. (Patient not taking: Reported on 01/02/2016) 60 tablet 2   No current facility-administered medications for this visit.    Allergies as of 01/02/2016 - Review Complete 01/02/2016  Allergen Reaction Noted  . Niaspan [niacin er]  02/09/2013  . Oxycodone-acetaminophen Nausea Only 09/24/2014  . Statins  02/09/2013    ROS:  General: Negative for anorexia, weight loss, fever, chills, fatigue, weakness. ENT: Negative for hoarseness, difficulty swallowing , nasal congestion. CV: Negative for chest pain, angina, palpitations, dyspnea on exertion, peripheral edema.  Respiratory: Negative for dyspnea at rest, dyspnea on exertion, cough, sputum, wheezing.  GI: See history of present illness. GU:  Negative for dysuria, hematuria, urinary incontinence, urinary frequency, nocturnal urination.  Endo: Negative for unusual weight change.    Physical Examination:   BP 139/66 mmHg  Pulse 71  Temp(Src) 99.3 F (37.4 C) (Oral)  Ht  (1.6 m)  Wt 174 lb (78.926 kg)  BMI 30.83 kg/m2  General: Well-nourished, well-developed in no acute distress.  Eyes: No icterus. Conjunctivae pink. Mouth: Oropharyngeal mucosa moist and pink , no lesions erythema or exudate. Lungs: Clear to auscultation bilaterally. Non-labored. Heart: Regular rate and rhythm, no murmurs rubs or gallops.  Abdomen: Bowel sounds are normal, tender with 1 finger palpation while flexing the abdominal wall muscles, nondistended, no  hepatosplenomegaly or masses, no abdominal bruits or hernia , no rebound or guarding.   Extremities: No lower extremity edema. No clubbing or deformities. Neuro: Alert and oriented x 3.  Grossly intact. Skin: Warm and dry, no jaundice.   Psych: Alert and cooperative, normal mood and affect.  Labs:    Imaging Studies: No results found.  Assessment and Plan:   Jodi Kirby is a 70 y.o. y/o female who comes in today with abdominal pain that is burning in nature and may be related to her workout routine. The patient's physical exam was consistent with this being more muscular pain that it is intestinal pain. The patient has been told to take anti-inflammatory medication with food and to avoid further straining of the abdominal wall muscles. She has also been told to use warm compresses her heating pad on the abdominal wall. If her symptoms do not improve she may need a CT scan in the future. The patient has been explained the plan and agrees with it.   Note: This dictation was prepared with Dragon dictation along with smaller phrase technology. Any transcriptional errors that result from this process are unintentional.

## 2016-01-14 ENCOUNTER — Ambulatory Visit (INDEPENDENT_AMBULATORY_CARE_PROVIDER_SITE_OTHER): Payer: Medicare PPO | Admitting: Family Medicine

## 2016-01-14 ENCOUNTER — Encounter: Payer: Self-pay | Admitting: Family Medicine

## 2016-01-14 VITALS — BP 122/60 | HR 73 | Temp 98.4°F | Resp 14 | Ht 63.0 in | Wt 174.1 lb

## 2016-01-14 DIAGNOSIS — I10 Essential (primary) hypertension: Secondary | ICD-10-CM

## 2016-01-14 DIAGNOSIS — R1013 Epigastric pain: Secondary | ICD-10-CM | POA: Diagnosis not present

## 2016-01-14 DIAGNOSIS — M25512 Pain in left shoulder: Secondary | ICD-10-CM

## 2016-01-14 DIAGNOSIS — G4700 Insomnia, unspecified: Secondary | ICD-10-CM | POA: Diagnosis not present

## 2016-01-14 DIAGNOSIS — E785 Hyperlipidemia, unspecified: Secondary | ICD-10-CM | POA: Diagnosis not present

## 2016-01-14 DIAGNOSIS — G4733 Obstructive sleep apnea (adult) (pediatric): Secondary | ICD-10-CM | POA: Diagnosis not present

## 2016-01-14 DIAGNOSIS — Z0000 Encounter for general adult medical examination without abnormal findings: Secondary | ICD-10-CM

## 2016-01-14 MED ORDER — TRIAMCINOLONE ACETONIDE 40 MG/ML IJ SUSP
40.0000 mg | Freq: Once | INTRAMUSCULAR | Status: AC
  Administered 2016-01-14: 40 mg via INTRAMUSCULAR

## 2016-01-14 MED ORDER — RANITIDINE HCL 150 MG PO TABS: 150.0000 mg | ORAL_TABLET | Freq: Two times a day (BID) | ORAL | Status: DC | PRN

## 2016-01-14 MED ORDER — AMLODIPINE-VALSARTAN-HCTZ 5-160-25 MG PO TABS: 1.0000 | ORAL_TABLET | Freq: Every day | ORAL | Status: DC

## 2016-01-14 MED ORDER — LIDOCAINE HCL (PF) 1 % IJ SOLN
2.0000 mL | Freq: Once | INTRAMUSCULAR | Status: AC
  Administered 2016-01-14: 2 mL via INTRADERMAL

## 2016-01-14 NOTE — Progress Notes (Signed)
Name: Jodi Kirby   MRN: [id number]    DOB: 09-12-1946   Date:01/14/2016       Progress Note  Subjective  Chief Complaint  Chief Complaint  Patient presents with  . Annual Exam  . Shoulder Pain    patient presents with left shoulder pain for about 3- months. no known injury.    HPI  Functional ability/safety issues: No Issues Hearing issues: Addressed  Activities of daily living: Discussed Home safety issues: No Issues  End Of Life Planning: Offered verbal information regarding advanced directives, healthcare power of attorney.  Preventative care, Health maintenance, Preventative health measures discussed.  Preventative screenings discussed today: lab work, colonoscopy, PAP not indicated - s/p hysterectomy for benign causes, mammogram, DEXA.  Low Dose CT Chest recommended if Age 64-80 years, 30 pack-year currently smoking OR have quit w/in 15years.   Lifestyle risk factor issued reviewed: Diet, exercise, weight management, advised patient smoking is not healthy, nutrition/diet.  Preventative health measures discussed (5-10 year plan).  Reviewed and recommended vaccinations: - Pneumovax  - Prevnar  - Annual Influenza - Zostavax - Tdap   Depression screening: Done Fall risk screening: Done Discuss ADLs/IADLs: Done  Current medical providers: See HPI  Other health risk factors identified this visit: No other issues Cognitive impairment issues: None identified  All above discussed with patient. Appropriate education, counseling and referral will be made based upon the above.    HTN: now taking Exforge HCTZ because is cheaper and is tolerating it well, bp is at goal, no chest pain or palpitation  Dyslipidemia: she can't tolerate statin therapy - it causes myalgia. Took Zetia but stopped because of cost of medication  Shoulder pain: left side, going on for a couple of months, getting progressively worse. Pain is on lateral shoulder ( over deltoid ) but can  radiate to scapula or left side of neck. Waking her up at night when sleeping on left lateral decubitus.   Dyspepsia: h. Pylori negative in January, took Ranitidine for a month and symptoms resolved. Advised to take it prn now.   Insomnia: she states she is doing well and stopped taking medication, able to fall and stay asleep unless the shoulder wakes her up.    Patient Active Problem List   Diagnosis Date Noted  . Insomnia 11/08/2015  . Angina pectoris (HCC) 07/10/2015  . Seasonal allergic rhinitis 07/09/2015  . GERD (gastroesophageal reflux disease) 07/09/2015  . Bee sting allergy 07/09/2015  . History of knee replacement procedure of right knee 04/29/2015  . History of colonic polyps 09/24/2014  . Fibrocystic breast 09/24/2014  . Arthritis of knee, degenerative 06/12/2014  . Hyperlipidemia 02/09/2013  . Coronary artery disease 02/09/2013  . Obstructive sleep apnea on CPAP 02/09/2013  . Essential hypertension 02/09/2013    Past Surgical History  Procedure Laterality Date  . Knee surgery  08/18/2011    arthroscopic right  . Lumbar fusion      L4-5, S-1  . Cystostomy    . Cardiac catheterization  2003    [place]  . Cardiac catheterization      [place]  . Neck surgery  2003  . Ankle surgery Right 1980  . Colonoscopy  2008, 2015    Dr. Servando Snare  . Tubal ligation    . Abdominal hysterectomy  1970  . Bladder surgery  2012  . Total knee arthroplasty Right 04/29/2015    Procedure: TOTAL KNEE ARTHROPLASTY;  Surgeon: Erin Sons, MD;  Location: [facility] ORS;  Service: Orthopedics;  Laterality:  Right;    Family History  Problem Relation Age of Onset  . Family history unknown: Yes    Social History   Social History  . Marital Status: Married    Spouse Name: N/A  . Number of Children: N/A  . Years of Education: N/A   Occupational History  . Not on file.   Social History Main Topics  . Smoking status: Never Smoker   . Smokeless tobacco: Never Used  . Alcohol Use: No   . Drug Use: No  . Sexual Activity: Yes   Other Topics Concern  . Not on file   Social History Narrative     Current outpatient prescriptions:  .  albuterol (PROVENTIL HFA;VENTOLIN HFA) 108 (90 BASE) MCG/ACT inhaler, Inhale into the lungs every 6 (six) hours as needed for wheezing or shortness of breath., Disp: , Rfl:  .  Alendronate Sodium (FOSAMAX PO), Take 1 tablet by mouth once a week., Disp: , Rfl:  .  ALPRAZolam (XANAX) 0.5 MG tablet, Take 1 tablet (0.5 mg total) by mouth at bedtime as needed for anxiety., Disp: 30 tablet, Rfl: 0 .  Amlodipine-Valsartan-HCTZ 5-160-25 MG TABS, Take 1 tablet by mouth daily., Disp: 90 tablet, Rfl: 1 .  aspirin 81 MG tablet, Take 81 mg by mouth daily., Disp: , Rfl:  .  Cholecalciferol (VITAMIN D3) 1000 UNITS CAPS, Take 1 capsule by mouth daily., Disp: , Rfl:  .  EPINEPHrine (EPIPEN 2-PAK) 0.3 mg/0.3 mL DEVI, Inject 0.3 mg into the muscle as needed. , Disp: , Rfl:  .  fluticasone (FLONASE) 50 MCG/ACT nasal spray, USE TWO SPRAY(S) IN EACH NOSTRIL AT BEDTIME, Disp: 48 g, Rfl: 0 .  loratadine (CLARITIN) 10 MG tablet, Take 1 tablet (10 mg total) by mouth daily., Disp: 30 tablet, Rfl: 5 .  Multiple Vitamins-Minerals (CENTRUM SILVER PO), Take by mouth daily., Disp: , Rfl:  .  nitroGLYCERIN (NITROSTAT) 0.4 MG SL tablet, Place 1 tablet (0.4 mg total) under the tongue every 5 (five) minutes as needed for chest pain., Disp: 25 tablet, Rfl: 6 .  omeprazole (PRILOSEC) 40 MG capsule, Take 40 mg by mouth 2 (two) times daily., Disp: , Rfl:  .  ranitidine (ZANTAC) 150 MG tablet, Take 1 tablet (150 mg total) by mouth 2 (two) times daily as needed for heartburn., Disp: 60 tablet, Rfl: 0  Allergies  Allergen Reactions  . Niaspan [Niacin Er]   . Oxycodone-Acetaminophen Nausea Only  . Statins      ROS  Constitutional: Negative for fever or weight change.  Respiratory: Negative for cough and shortness of breath.   Cardiovascular: Negative for chest pain or  palpitations.  Gastrointestinal: Negative for abdominal pain, no bowel changes.  Musculoskeletal: Negative for gait problem or joint swelling.  Skin: Negative for rash.  Neurological: Negative for dizziness or headache.  No other specific complaints in a complete review of systems (except as listed in HPI above).  Objective  Filed Vitals:   01/14/16 0820  BP: 122/60  Pulse: 73  Temp: 98.4 F (36.9 C)  TempSrc: Oral  Resp: 14  Height:  (1.6 m)  Weight: 174 lb 1.6 oz (78.971 kg)  SpO2: 97%    Body mass index is 30.85 kg/(m^2).  Physical Exam  Constitutional: Patient appears well-developed and well-nourished. No distress.  HENT: Head: Normocephalic and atraumatic. Ears: B TMs ok, no erythema or effusion; Nose: Nose normal. Mouth/Throat: Oropharynx is clear and moist. No oropharyngeal exudate.  Eyes: Conjunctivae and EOM are normal. Pupils are  equal, round, and reactive to light. No scleral icterus.  Neck: Normal range of motion. Neck supple. No JVD present. No thyromegaly present.  Cardiovascular: Normal rate, regular rhythm and normal heart sounds.  No murmur heard. No BLE edema. Pulmonary/Chest: Effort normal and breath sounds normal. No respiratory distress. Abdominal: Soft. Bowel sounds are normal, no distension. There is no tenderness. no masses Breast: no lumps or masses, no nipple discharge or rashes FEMALE GENITALIA:  External genitalia normal External urethra normal RECTAL: not done Musculoskeletal: Pain during palpation of deltoid bursa, pain with abduction of left shoulder. Right knee s/p TKR Neurological: he is alert and oriented to person, place, and time. No cranial nerve deficit. Coordination, balance, strength, speech and gait are normal.  Skin: Skin is warm and dry. No rash noted. No erythema.  Psychiatric: Patient has a normal mood and affect. behavior is normal. Judgment and thought content normal.  Recent Results (from the past 2160 hour(s))  Lipid  panel     Status: Abnormal   Collection Time: 11/08/15 11:01 AM  Result Value Ref Range   Cholesterol, Total 211 (H) 100 - 199 mg/dL   Triglycerides 161202 (H) 0 - 149 mg/dL   HDL 48 >09>39 mg/dL   VLDL Cholesterol Cal 40 5 - 40 mg/dL   LDL Calculated 604123 (H) 0 - 99 mg/dL   Chol/HDL Ratio 4.4 0.0 - 4.4 ratio units    Comment:                                   T. Chol/HDL Ratio                                             Men  Women                               1/2 Avg.Risk  3.4    3.3                                   Avg.Risk  5.0    4.4                                2X Avg.Risk  9.6    7.1                                3X Avg.Risk 23.4   11.0   Hemoglobin A1c     Status: None   Collection Time: 11/08/15 11:01 AM  Result Value Ref Range   Hgb A1c MFr Bld 5.5 4.8 - 5.6 %    Comment:          Pre-diabetes: 5.7 - 6.4          Diabetes: >6.4          Glycemic control for adults with diabetes: <7.0    Est. average glucose Bld gHb Est-mCnc 111 mg/dL  AST     Status: None   Collection Time: 11/08/15 11:01 AM  Result Value Ref Range   AST 14 0 - 40 IU/L  ALT     Status: None   Collection Time: 11/08/15 11:01 AM  Result Value Ref Range   ALT 17 0 - 32 IU/L  Glom Filt Rate, Estimated     Status: None   Collection Time: 11/08/15 11:01 AM  Result Value Ref Range   Creatinine, Ser 0.92 0.57 - 1.00 mg/dL   GFR calc non Af Amer 63 >59 mL/min/1.73   GFR calc Af Amer 73 >59 mL/min/1.73  H. pylori antibody, IgG     Status: None   Collection Time: 12/09/15  9:16 AM  Result Value Ref Range   H Pylori IgG <0.9 0.0 - 0.8 U/mL    Comment:                              Negative            <0.9                              Indeterminate  0.9 - 1.0                              Positive            >1.0     PHQ2/9: Depression screen [facility] 2/9 01/14/2016 12/09/2015 11/08/2015 07/09/2015  Decreased Interest 0 0 0 0  Down, Depressed, Hopeless 0 0 0 0  PHQ - 2 Score 0 0 0 0     Fall Risk: Fall Risk   01/14/2016 12/09/2015 11/08/2015 07/09/2015  Falls in the past year? No No No No     Functional Status Survey: Is the patient deaf or have difficulty hearing?: No Does the patient have difficulty seeing, even when wearing glasses/contacts?: No Does the patient have difficulty concentrating, remembering, or making decisions?: No Does the patient have difficulty walking or climbing stairs?: No Does the patient have difficulty dressing or bathing?: No Does the patient have difficulty doing errands alone such as visiting a doctor's office or shopping?: No    Assessment & Plan  1. Medicare annual wellness visit, subsequent  Discussed importance of 150 minutes of physical activity weekly, eat two servings of fish weekly, eat one serving of tree nuts ( cashews, pistachios, pecans, almonds.Marland Kitchen) every other day, eat 6 servings of fruit/vegetables daily and drink plenty of water and avoid sweet beverages.   2. Dyspepsia  - ranitidine (ZANTAC) 150 MG tablet; Take 1 tablet (150 mg total) by mouth 2 (two) times daily as needed for heartburn.  Dispense: 60 tablet; Refill: 0  3. Essential hypertension  - Amlodipine-Valsartan-HCTZ 5-160-25 MG TABS; Take 1 tablet by mouth daily.  Dispense: 90 tablet; Refill: 1  4. Left shoulder pain  Consent form signed Localized bursa - left deltoid bursa Area prepped with alcohol  Injection with lidocaine 1% and Kenalog 40mg /1 ml on bursa sack Patient tolerated procedure well No side effects   5. Hyperlipidemia  Off Zetia because of cost, discussed risk.   6. Insomnia  Doing well now, off medication

## 2016-01-14 NOTE — Patient Instructions (Signed)
  [name] , Thank you for taking time to come for your Medicare Wellness Visit. I appreciate your ongoing commitment to your health goals. Please review the following plan we discussed and let me know if I can assist you in the future.   These are the goals we discussed:  Continue exercise Continue healthy diet   This is a list of the screening recommended for you and due dates:  Health Maintenance  Topic Date Due  .  Hepatitis C: One time screening is recommended by Center for Disease Control  (CDC) for  adults born from [birth year] through 1966.   08/25/2029*  . Flu Shot  06/02/2016  . Mammogram  09/22/2017  . Tetanus Vaccine  02/22/2018  . Colon Cancer Screening  12/15/2023  . DEXA scan (bone density measurement)  Completed  . Shingles Vaccine  Completed  . Pneumonia vaccines  Completed  *Topic was postponed. The date shown is not the original due date.

## 2016-01-18 DIAGNOSIS — G4733 Obstructive sleep apnea (adult) (pediatric): Secondary | ICD-10-CM | POA: Diagnosis not present

## 2016-01-31 ENCOUNTER — Other Ambulatory Visit: Payer: Self-pay

## 2016-01-31 DIAGNOSIS — R1013 Epigastric pain: Secondary | ICD-10-CM

## 2016-01-31 DIAGNOSIS — I10 Essential (primary) hypertension: Secondary | ICD-10-CM

## 2016-01-31 MED ORDER — AMLODIPINE-VALSARTAN-HCTZ 5-160-25 MG PO TABS: 1.0000 | ORAL_TABLET | Freq: Every day | ORAL | Status: DC

## 2016-01-31 MED ORDER — RANITIDINE HCL 150 MG PO TABS: 150.0000 mg | ORAL_TABLET | Freq: Two times a day (BID) | ORAL | Status: DC | PRN

## 2016-01-31 MED ORDER — ALENDRONATE SODIUM 70 MG PO TABS: 70.0000 mg | ORAL_TABLET | ORAL | Status: DC

## 2016-01-31 MED ORDER — OMEPRAZOLE 40 MG PO CPDR: 40.0000 mg | DELAYED_RELEASE_CAPSULE | Freq: Two times a day (BID) | ORAL | Status: DC

## 2016-01-31 NOTE — Telephone Encounter (Signed)
Got a fax from [facility] requesting refills.  Refill request was sent to Dr. Alba CoryKrichna Sowles for approval and submission.

## 2016-02-12 DIAGNOSIS — H547 Unspecified visual loss: Secondary | ICD-10-CM | POA: Diagnosis not present

## 2016-02-12 DIAGNOSIS — J309 Allergic rhinitis, unspecified: Secondary | ICD-10-CM | POA: Diagnosis not present

## 2016-02-12 DIAGNOSIS — I10 Essential (primary) hypertension: Secondary | ICD-10-CM | POA: Diagnosis not present

## 2016-02-12 DIAGNOSIS — E669 Obesity, unspecified: Secondary | ICD-10-CM | POA: Diagnosis not present

## 2016-02-12 DIAGNOSIS — J45909 Unspecified asthma, uncomplicated: Secondary | ICD-10-CM | POA: Diagnosis not present

## 2016-02-12 DIAGNOSIS — I252 Old myocardial infarction: Secondary | ICD-10-CM | POA: Diagnosis not present

## 2016-02-12 DIAGNOSIS — K219 Gastro-esophageal reflux disease without esophagitis: Secondary | ICD-10-CM | POA: Diagnosis not present

## 2016-02-12 DIAGNOSIS — M810 Age-related osteoporosis without current pathological fracture: Secondary | ICD-10-CM | POA: Diagnosis not present

## 2016-02-12 DIAGNOSIS — E785 Hyperlipidemia, unspecified: Secondary | ICD-10-CM | POA: Diagnosis not present

## 2016-02-18 DIAGNOSIS — G4733 Obstructive sleep apnea (adult) (pediatric): Secondary | ICD-10-CM | POA: Diagnosis not present

## 2016-02-24 ENCOUNTER — Telehealth: Payer: Self-pay

## 2016-02-24 NOTE — Telephone Encounter (Signed)
Patient called and [facility] [place] but [facility]. [facility] and [facility]. [facility] patient request of-Loratadine, Omperazole, Ranitidine and the Fosamax

## 2016-03-03 ENCOUNTER — Other Ambulatory Visit: Payer: Self-pay | Admitting: Family Medicine

## 2016-03-03 MED ORDER — EZETIMIBE 10 MG PO TABS: 10.0000 mg | ORAL_TABLET | Freq: Every day | ORAL | Status: DC

## 2016-03-03 NOTE — Telephone Encounter (Signed)
It was reported on 01/14/16 that patient was not taking the requested medication.  Refill request was sent to Dr. Alba CoryKrichna Sowles for approval and submission.

## 2016-03-03 NOTE — Telephone Encounter (Signed)
Pt had an a ppt 03-09-16 but the dr said that she could cancel it. Pt needs her cholesterol meds refilled FOR 90 DAY SUPPLY. PHARM IS [facility]. MAKE SURE THAT IN THE CHART IT SAYS [facility] FOR SHE JUST CHANGED.

## 2016-03-09 ENCOUNTER — Ambulatory Visit: Payer: Medicare PPO | Admitting: Family Medicine

## 2016-03-19 DIAGNOSIS — G4733 Obstructive sleep apnea (adult) (pediatric): Secondary | ICD-10-CM | POA: Diagnosis not present

## 2016-05-12 DIAGNOSIS — Z96651 Presence of right artificial knee joint: Secondary | ICD-10-CM | POA: Diagnosis not present

## 2016-05-12 DIAGNOSIS — M7542 Impingement syndrome of left shoulder: Secondary | ICD-10-CM | POA: Diagnosis not present

## 2016-05-15 ENCOUNTER — Ambulatory Visit (INDEPENDENT_AMBULATORY_CARE_PROVIDER_SITE_OTHER): Payer: Medicare PPO | Admitting: Family Medicine

## 2016-05-15 ENCOUNTER — Encounter: Payer: Self-pay | Admitting: Family Medicine

## 2016-05-15 VITALS — BP 138/72 | HR 66 | Temp 98.8°F | Resp 16 | Ht 63.0 in | Wt 174.7 lb

## 2016-05-15 DIAGNOSIS — M25512 Pain in left shoulder: Secondary | ICD-10-CM | POA: Diagnosis not present

## 2016-05-15 DIAGNOSIS — Z9989 Dependence on other enabling machines and devices: Secondary | ICD-10-CM

## 2016-05-15 DIAGNOSIS — I10 Essential (primary) hypertension: Secondary | ICD-10-CM | POA: Diagnosis not present

## 2016-05-15 DIAGNOSIS — G4733 Obstructive sleep apnea (adult) (pediatric): Secondary | ICD-10-CM

## 2016-05-15 DIAGNOSIS — K219 Gastro-esophageal reflux disease without esophagitis: Secondary | ICD-10-CM

## 2016-05-15 DIAGNOSIS — E785 Hyperlipidemia, unspecified: Secondary | ICD-10-CM | POA: Diagnosis not present

## 2016-05-15 DIAGNOSIS — I209 Angina pectoris, unspecified: Secondary | ICD-10-CM | POA: Diagnosis not present

## 2016-05-15 DIAGNOSIS — G4700 Insomnia, unspecified: Secondary | ICD-10-CM

## 2016-05-15 DIAGNOSIS — M8580 Other specified disorders of bone density and structure, unspecified site: Secondary | ICD-10-CM | POA: Diagnosis not present

## 2016-05-15 MED ORDER — AMLODIPINE-VALSARTAN-HCTZ 5-160-25 MG PO TABS: 1.0000 | ORAL_TABLET | Freq: Every day | ORAL | Status: DC

## 2016-05-15 MED ORDER — ALENDRONATE SODIUM 70 MG PO TABS: 70.0000 mg | ORAL_TABLET | ORAL | Status: DC

## 2016-05-15 NOTE — Progress Notes (Signed)
Name: Jodi Kirby   MRN: [id number]    DOB: 06-25-1946   Date:05/15/2016       Progress Note  Subjective  Chief Complaint  Chief Complaint  Patient presents with  . Follow-up    patient is here for 80-month check up  . Hypertension  . Hyperlipidemia  . Insomnia  . Dyspepsia  . Shoulder Pain    patient recently saw Dr. Jerline Pain for her 1 yr f/u regarding her knee replacement, mentioned about her shoulder pain and was given a cortisone injection    HPI  OSA: using CPAP every night, she keeps it on for about 7 hours per night. Not sleeping well for the past couple of weeks because her sister died from complications of a stroke.  So she has not been waking up feeling rested.   Insomnia: she has a long history of interrupted sleep, but worse since sister died 2 weeks ago, she has not been taking Alprazolam lately but will take it tonight to help her relax.   Knee replacement surgery right: surgery was June 27 th, 2016, she has completed her PT and flexion up o 128  extension 180 degrees ( she states it is even better now - flexing more ), she is still doing her exercises at home and is back at the gym three times weekly. Recently by Dr. Erin Sons and is doing well  Left shoulder pain: started 6 months ago, but got worse a couple of months ago when she was trying to seat down on the floor and lost her balance and caught her weight on left arm. She saw Dr. Gavin Potters a couple of days ago and was given a steroid injection on left shoulder and states pain is under control now. Seems to have worked and has a follow up with him in 2 weeks.   HTN: taking medication as prescribed, denies side effects of medication, bp is at goal. She denies any recent episodes of chest pain, SOB or palpitation   Hyperlipidemia: unable to tolerate statins, even in a low dose of 2.5 mg of Crestor, muscles aches were severe and she stopped medication. Dr. Mariah Milling advised Zetia and we gave her a prescription on  her last visit, however it was too expensive and patient stopped taking it.   GERD: under control with Omeprazole in am and Ranitidine at night, and denies side effects. No heartburn, no regurgitation. Trying to wean self off.  Bradycardia: present for years, no palpitation, syncope, chest pain  Angina Pectoris: she has CAD, mild disease, seeing Dr. Mariah Milling, no episodes of chest pain over the past 6  months, good exercise tolerance. Taking aspirin and ARB, not on beta-blocker because of bradycardia and not on statin because of intolerance.  Osteopenia: diagnosed in 2016 - and was started on Fosamax, denies side effects. She states she used to take something for her bones many years ago. Frax was 1.6% for hip fracture but not reliable since she took medications in the past, recheck bone density in 2018   Patient Active Problem List   Diagnosis Date Noted  . Osteopenia 05/15/2016  . Insomnia 11/08/2015  . Angina pectoris (HCC) 07/10/2015  . Seasonal allergic rhinitis 07/09/2015  . GERD (gastroesophageal reflux disease) 07/09/2015  . Bee sting allergy 07/09/2015  . History of knee replacement procedure of right knee 04/29/2015  . History of colonic polyps 09/24/2014  . Fibrocystic breast 09/24/2014  . Arthritis of knee, degenerative 06/12/2014  . Hyperlipidemia 02/09/2013  . Coronary  artery disease 02/09/2013  . Obstructive sleep apnea on CPAP 02/09/2013  . Essential hypertension 02/09/2013    Past Surgical History  Procedure Laterality Date  . Knee surgery  08/18/2011    arthroscopic right  . Lumbar fusion      L4-5, S-1  . Cystostomy    . Cardiac catheterization  2003    [facility]  . Cardiac catheterization      [facility]  . Neck surgery  2003  . Ankle surgery Right 1980  . Colonoscopy  2008, 2015    Dr. Servando SnareWohl  . Tubal ligation    . Abdominal hysterectomy  1970  . Bladder surgery  2012  . Total knee arthroplasty Right 04/29/2015    Procedure: TOTAL KNEE ARTHROPLASTY;   Surgeon: Erin SonsHarold Kernodle, MD;  Location: [facility] ORS;  Service: Orthopedics;  Laterality: Right;    Family History  Problem Relation Age of Onset  . Family history unknown: Yes    Social History   Social History  . Marital Status: Married    Spouse Name: N/A  . Number of Children: N/A  . Years of Education: N/A   Occupational History  . Not on file.   Social History Main Topics  . Smoking status: Never Smoker   . Smokeless tobacco: Never Used  . Alcohol Use: No  . Drug Use: No  . Sexual Activity: Yes   Other Topics Concern  . Not on file   Social History Narrative     Current outpatient prescriptions:  .  alendronate (FOSAMAX) 70 MG tablet, Take 1 tablet (70 mg total) by mouth once a week., Disp: 12 tablet, Rfl: 1 .  ALPRAZolam (XANAX) 0.5 MG tablet, Take 1 tablet (0.5 mg total) by mouth at bedtime as needed for anxiety., Disp: 30 tablet, Rfl: 0 .  Amlodipine-Valsartan-HCTZ 5-160-25 MG TABS, Take 1 tablet by mouth daily., Disp: 90 tablet, Rfl: 1 .  aspirin 81 MG tablet, Take 81 mg by mouth daily., Disp: , Rfl:  .  Cholecalciferol (VITAMIN D3) 1000 UNITS CAPS, Take 1 capsule by mouth daily., Disp: , Rfl:  .  EPINEPHrine (EPIPEN 2-PAK) 0.3 mg/0.3 mL DEVI, Inject 0.3 mg into the muscle as needed. , Disp: , Rfl:  .  fluticasone (FLONASE) 50 MCG/ACT nasal spray, USE TWO SPRAY(S) IN EACH NOSTRIL AT BEDTIME, Disp: 48 g, Rfl: 0 .  loratadine (CLARITIN) 10 MG tablet, Take 1 tablet (10 mg total) by mouth daily., Disp: 30 tablet, Rfl: 5 .  Multiple Vitamins-Minerals (CENTRUM SILVER PO), Take by mouth daily., Disp: , Rfl:  .  nitroGLYCERIN (NITROSTAT) 0.4 MG SL tablet, Place 1 tablet (0.4 mg total) under the tongue every 5 (five) minutes as needed for chest pain., Disp: 25 tablet, Rfl: 6 .  omeprazole (PRILOSEC) 40 MG capsule, Take 1 capsule (40 mg total) by mouth 2 (two) times daily., Disp: 180 capsule, Rfl: 1 .  ranitidine (ZANTAC) 150 MG tablet, Take 1 tablet (150 mg total) by mouth  2 (two) times daily as needed for heartburn., Disp: 180 tablet, Rfl: 1  Allergies  Allergen Reactions  . Niaspan [Niacin Er]   . Oxycodone-Acetaminophen Nausea Only  . Statins      ROS  Constitutional: Negative for fever or weight change.  Respiratory: Negative for cough and shortness of breath.   Cardiovascular: Negative for chest pain or palpitations.  Gastrointestinal: Negative for abdominal pain, no bowel changes.  Musculoskeletal: Negative for gait problem or joint swelling.  Skin: Negative for rash.  Neurological: Negative for  dizziness or headache.  No other specific complaints in a complete review of systems (except as listed in HPI above).  Objective  Filed Vitals:   05/15/16 0807  BP: 138/72  Pulse: 66  Temp: 98.8 F (37.1 C)  TempSrc: Oral  Resp: 16  Height: 5\' 3"  (1.6 m)  Weight: 174 lb 11.2 oz (79.243 kg)  SpO2: 99%    Body mass index is 30.95 kg/(m^2).  Physical Exam  Constitutional: Patient appears well-developed and well-nourished. Obese No distress.  HEENT: head atraumatic, normocephalic, pupils equal and reactive to light, , neck supple, throat within normal limits Cardiovascular: Normal rate, regular rhythm and normal heart sounds.  No murmur heard. No BLE edema. Pulmonary/Chest: Effort normal and breath sounds normal. No respiratory distress. Abdominal: Soft.  There is no tenderness. Psychiatric: Patient has a normal mood and affect. behavior is normal. Judgment and thought content normal. Muscular Skeletal: discomfort with abduction of left shoulder  PHQ2/9: Depression screen [facility] 2/9 05/15/2016 01/14/2016 12/09/2015 11/08/2015 07/09/2015  Decreased Interest 0 0 0 0 0  Down, Depressed, Hopeless 0 0 0 0 0  PHQ - 2 Score 0 0 0 0 0    Fall Risk: Fall Risk  05/15/2016 01/14/2016 12/09/2015 11/08/2015 07/09/2015  Falls in the past year? No No No No No    Functional Status Survey: Is the patient deaf or have difficulty hearing?: No Does the patient have  difficulty seeing, even when wearing glasses/contacts?: No Does the patient have difficulty concentrating, remembering, or making decisions?: No Does the patient have difficulty walking or climbing stairs?: No Does the patient have difficulty dressing or bathing?: No Does the patient have difficulty doing errands alone such as visiting a doctor's office or shopping?: No    Assessment & Plan  1. Essential hypertension  - Amlodipine-Valsartan-HCTZ 5-160-25 MG TABS; Take 1 tablet by mouth daily.  Dispense: 90 tablet; Refill: 1  2. Hyperlipidemia  Unable to tolerate statin and can't afford Zetia  3. Left shoulder pain  Continue follow up with Dr. Gavin Potters, doing wellnow  4. Insomnia  Continue prn alprazolam   5. Obstructive sleep apnea on CPAP  Continue CPAP machine  6. Gastroesophageal reflux disease without esophagitis  Try to wean off Omeprazole and take Ranitidine prn  7. Angina pectoris (HCC)   Doing well, continue follow up with Dr. Mariah Milling, beta-blocker and NTG prn - but has not used it in over one year  8. Osteopenia  - alendronate (FOSAMAX) 70 MG tablet; Take 1 tablet (70 mg total) by mouth once a week.  Dispense: 12 tablet; Refill: 1

## 2016-06-27 ENCOUNTER — Observation Stay
Admission: EM | Admit: 2016-06-27 | Discharge: 2016-06-28 | Disposition: A | Discharge status: Home / Self Care | Payer: Medicare PPO | Attending: Internal Medicine | Admitting: Internal Medicine

## 2016-06-27 ENCOUNTER — Encounter: Payer: Self-pay | Admitting: Emergency Medicine

## 2016-06-27 ENCOUNTER — Emergency Department: Payer: Medicare PPO

## 2016-06-27 DIAGNOSIS — Z96651 Presence of right artificial knee joint: Secondary | ICD-10-CM | POA: Insufficient documentation

## 2016-06-27 DIAGNOSIS — Z885 Allergy status to narcotic agent status: Secondary | ICD-10-CM | POA: Diagnosis not present

## 2016-06-27 DIAGNOSIS — H209 Unspecified iridocyclitis: Secondary | ICD-10-CM | POA: Diagnosis not present

## 2016-06-27 DIAGNOSIS — Z91030 Bee allergy status: Secondary | ICD-10-CM | POA: Diagnosis not present

## 2016-06-27 DIAGNOSIS — Z7951 Long term (current) use of inhaled steroids: Secondary | ICD-10-CM | POA: Insufficient documentation

## 2016-06-27 DIAGNOSIS — Z8249 Family history of ischemic heart disease and other diseases of the circulatory system: Secondary | ICD-10-CM | POA: Insufficient documentation

## 2016-06-27 DIAGNOSIS — E785 Hyperlipidemia, unspecified: Secondary | ICD-10-CM | POA: Insufficient documentation

## 2016-06-27 DIAGNOSIS — K219 Gastro-esophageal reflux disease without esophagitis: Secondary | ICD-10-CM | POA: Insufficient documentation

## 2016-06-27 DIAGNOSIS — Z7982 Long term (current) use of aspirin: Secondary | ICD-10-CM | POA: Diagnosis not present

## 2016-06-27 DIAGNOSIS — R079 Chest pain, unspecified: Secondary | ICD-10-CM | POA: Diagnosis not present

## 2016-06-27 DIAGNOSIS — M8580 Other specified disorders of bone density and structure, unspecified site: Secondary | ICD-10-CM | POA: Insufficient documentation

## 2016-06-27 DIAGNOSIS — G4733 Obstructive sleep apnea (adult) (pediatric): Secondary | ICD-10-CM | POA: Insufficient documentation

## 2016-06-27 DIAGNOSIS — Z90710 Acquired absence of both cervix and uterus: Secondary | ICD-10-CM | POA: Insufficient documentation

## 2016-06-27 DIAGNOSIS — Z888 Allergy status to other drugs, medicaments and biological substances status: Secondary | ICD-10-CM | POA: Diagnosis not present

## 2016-06-27 DIAGNOSIS — E559 Vitamin D deficiency, unspecified: Secondary | ICD-10-CM | POA: Insufficient documentation

## 2016-06-27 DIAGNOSIS — J302 Other seasonal allergic rhinitis: Secondary | ICD-10-CM | POA: Diagnosis not present

## 2016-06-27 DIAGNOSIS — Z981 Arthrodesis status: Secondary | ICD-10-CM | POA: Diagnosis not present

## 2016-06-27 DIAGNOSIS — I2510 Atherosclerotic heart disease of native coronary artery without angina pectoris: Secondary | ICD-10-CM | POA: Diagnosis not present

## 2016-06-27 DIAGNOSIS — I10 Essential (primary) hypertension: Secondary | ICD-10-CM | POA: Diagnosis not present

## 2016-06-27 DIAGNOSIS — Z79899 Other long term (current) drug therapy: Secondary | ICD-10-CM | POA: Insufficient documentation

## 2016-06-27 DIAGNOSIS — Z9889 Other specified postprocedural states: Secondary | ICD-10-CM | POA: Diagnosis not present

## 2016-06-27 DIAGNOSIS — E876 Hypokalemia: Secondary | ICD-10-CM | POA: Diagnosis not present

## 2016-06-27 DIAGNOSIS — G4700 Insomnia, unspecified: Secondary | ICD-10-CM | POA: Diagnosis not present

## 2016-06-27 DIAGNOSIS — R0789 Other chest pain: Secondary | ICD-10-CM | POA: Diagnosis present

## 2016-06-27 LAB — COMPREHENSIVE METABOLIC PANEL
ALT: 25 U/L (ref 14–54)
AST: 20 U/L (ref 15–41)
Albumin: 4.3 g/dL (ref 3.5–5.0)
Alkaline Phosphatase: 77 U/L (ref 38–126)
Anion gap: 8 (ref 5–15)
BUN: 14 mg/dL (ref 6–20)
CHLORIDE: 104 mmol/L (ref 101–111)
CO2: 28 mmol/L (ref 22–32)
CREATININE: 0.96 mg/dL (ref 0.44–1.00)
Calcium: 9.2 mg/dL (ref 8.9–10.3)
GFR calc Af Amer: >60 mL/min (ref >60)
GFR, EST NON AFRICAN AMERICAN: 59 mL/min — AB (ref >60)
Glucose, Bld: 115 mg/dL — ABNORMAL HIGH (ref 65–99)
Potassium: 3.3 mmol/L — ABNORMAL LOW (ref 3.5–5.1)
SODIUM: 140 mmol/L (ref 135–145)
Total Bilirubin: 0.3 mg/dL (ref 0.3–1.2)
Total Protein: 6.8 g/dL (ref 6.5–8.1)

## 2016-06-27 LAB — CBC
HCT: 34.2 % — ABNORMAL LOW (ref 35.0–47.0)
HEMOGLOBIN: 11.8 g/dL — AB (ref 12.0–16.0)
MCH: 28.8 pg (ref 26.0–34.0)
MCHC: 34.4 g/dL (ref 32.0–36.0)
MCV: 83.8 fL (ref 80.0–100.0)
PLATELETS: 270 10*3/uL (ref 150–440)
RBC: 4.08 MIL/uL (ref 3.80–5.20)
RDW: 12.6 % (ref 11.5–14.5)
WBC: 9.6 10*3/uL (ref 3.6–11.0)

## 2016-06-27 LAB — TROPONIN I
Troponin I: <0.03 ng/mL (ref <0.03)
Troponin I: <0.03 ng/mL (ref <0.03)
Troponin I: <0.03 ng/mL (ref <0.03)

## 2016-06-27 MED ORDER — AMLODIPINE-VALSARTAN-HCTZ 5-160-25 MG PO TABS: 1.0000 | ORAL_TABLET | Freq: Every day | ORAL | Status: DC

## 2016-06-27 MED ORDER — NITROGLYCERIN 2 % TD OINT: 0.5000 [in_us] | TOPICAL_OINTMENT | Freq: Four times a day (QID) | TRANSDERMAL | Status: DC

## 2016-06-27 MED ORDER — IRBESARTAN 75 MG PO TABS
150.0000 mg | ORAL_TABLET | Freq: Every day | ORAL | Status: DC
  Administered 2016-06-28: 150 mg via ORAL
  Filled 2016-06-27: qty 1
  Filled 2016-06-27: qty 2

## 2016-06-27 MED ORDER — ONDANSETRON HCL 4 MG PO TABS: 4.0000 mg | ORAL_TABLET | Freq: Four times a day (QID) | ORAL | Status: DC | PRN

## 2016-06-27 MED ORDER — FLUTICASONE PROPIONATE 50 MCG/ACT NA SUSP
2.0000 | Freq: Every day | NASAL | Status: DC
  Administered 2016-06-27: 2 via NASAL
  Filled 2016-06-27: qty 16

## 2016-06-27 MED ORDER — NITROGLYCERIN 2 % TD OINT
1.0000 [in_us] | TOPICAL_OINTMENT | Freq: Once | TRANSDERMAL | Status: AC
  Administered 2016-06-27: 1 [in_us] via TOPICAL
  Filled 2016-06-27: qty 1

## 2016-06-27 MED ORDER — FAMOTIDINE 20 MG PO TABS
20.0000 mg | ORAL_TABLET | Freq: Two times a day (BID) | ORAL | Status: DC
  Administered 2016-06-27 – 2016-06-28 (×2): 20 mg via ORAL
  Filled 2016-06-27 (×2): qty 1

## 2016-06-27 MED ORDER — SODIUM CHLORIDE 0.9% FLUSH
3.0000 mL | Freq: Two times a day (BID) | INTRAVENOUS | Status: DC
  Administered 2016-06-27 – 2016-06-28 (×2): 3 mL via INTRAVENOUS

## 2016-06-27 MED ORDER — NITROGLYCERIN 0.4 MG SL SUBL: 0.4000 mg | SUBLINGUAL_TABLET | SUBLINGUAL | Status: DC | PRN

## 2016-06-27 MED ORDER — LORATADINE 10 MG PO TABS
10.0000 mg | ORAL_TABLET | Freq: Every day | ORAL | Status: DC
  Administered 2016-06-27 – 2016-06-28 (×2): 10 mg via ORAL
  Filled 2016-06-27 (×2): qty 1

## 2016-06-27 MED ORDER — TRAMADOL HCL 50 MG PO TABS: 50.0000 mg | ORAL_TABLET | Freq: Four times a day (QID) | ORAL | Status: DC | PRN

## 2016-06-27 MED ORDER — ZOLPIDEM TARTRATE 5 MG PO TABS: 5.0000 mg | ORAL_TABLET | Freq: Every evening | ORAL | Status: DC | PRN

## 2016-06-27 MED ORDER — ASPIRIN 81 MG PO CHEW
324.0000 mg | CHEWABLE_TABLET | Freq: Once | ORAL | Status: AC
  Administered 2016-06-27: 243 mg via ORAL
  Filled 2016-06-27: qty 3

## 2016-06-27 MED ORDER — NITROGLYCERIN 2 % TD OINT
0.5000 [in_us] | TOPICAL_OINTMENT | Freq: Four times a day (QID) | TRANSDERMAL | Status: DC
  Administered 2016-06-27 – 2016-06-28 (×2): 0.5 [in_us] via TOPICAL
  Filled 2016-06-27 (×3): qty 1

## 2016-06-27 MED ORDER — ADULT MULTIVITAMIN W/MINERALS CH
1.0000 | ORAL_TABLET | Freq: Every day | ORAL | Status: DC
  Administered 2016-06-28: 1 via ORAL
  Filled 2016-06-27: qty 1

## 2016-06-27 MED ORDER — OMEGA-3-ACID ETHYL ESTERS 1 G PO CAPS
1.0000 g | ORAL_CAPSULE | Freq: Two times a day (BID) | ORAL | Status: DC
  Administered 2016-06-27 – 2016-06-28 (×2): 1 g via ORAL
  Filled 2016-06-27 (×2): qty 1

## 2016-06-27 MED ORDER — MORPHINE SULFATE (PF) 2 MG/ML IV SOLN: 2.0000 mg | INTRAVENOUS | Status: DC | PRN

## 2016-06-27 MED ORDER — ALPRAZOLAM 0.5 MG PO TABS: 0.5000 mg | ORAL_TABLET | Freq: Every evening | ORAL | Status: DC | PRN

## 2016-06-27 MED ORDER — METOPROLOL TARTRATE 25 MG PO TABS
12.5000 mg | ORAL_TABLET | Freq: Two times a day (BID) | ORAL | Status: DC
  Administered 2016-06-28: 12.5 mg via ORAL
  Filled 2016-06-27 (×2): qty 1

## 2016-06-27 MED ORDER — HYDROCHLOROTHIAZIDE 25 MG PO TABS
25.0000 mg | ORAL_TABLET | Freq: Every day | ORAL | Status: DC
  Administered 2016-06-28: 25 mg via ORAL
  Filled 2016-06-27: qty 1

## 2016-06-27 MED ORDER — VITAMIN D 1000 UNITS PO TABS
1000.0000 [IU] | ORAL_TABLET | Freq: Every day | ORAL | Status: DC
  Administered 2016-06-28: 1000 [IU] via ORAL
  Filled 2016-06-27 (×2): qty 1

## 2016-06-27 MED ORDER — ACETAMINOPHEN 325 MG PO TABS
650.0000 mg | ORAL_TABLET | Freq: Four times a day (QID) | ORAL | Status: DC | PRN
  Administered 2016-06-27 – 2016-06-28 (×3): 650 mg via ORAL
  Filled 2016-06-27 (×3): qty 2

## 2016-06-27 MED ORDER — ONDANSETRON HCL 4 MG/2ML IJ SOLN: 4.0000 mg | Freq: Four times a day (QID) | INTRAMUSCULAR | Status: DC | PRN

## 2016-06-27 MED ORDER — PANTOPRAZOLE SODIUM 40 MG PO TBEC
40.0000 mg | DELAYED_RELEASE_TABLET | Freq: Every day | ORAL | Status: DC
  Administered 2016-06-28: 40 mg via ORAL
  Filled 2016-06-27: qty 1

## 2016-06-27 MED ORDER — NITROGLYCERIN 0.4 MG SL SUBL
0.4000 mg | SUBLINGUAL_TABLET | SUBLINGUAL | Status: DC | PRN
  Administered 2016-06-27: 0.4 mg via SUBLINGUAL
  Filled 2016-06-27: qty 3

## 2016-06-27 MED ORDER — POTASSIUM CHLORIDE 20 MEQ PO PACK
40.0000 meq | PACK | Freq: Once | ORAL | Status: AC
Start: 2016-06-27 — End: 2016-06-27
  Administered 2016-06-27: 40 meq via ORAL
  Filled 2016-06-27: qty 2

## 2016-06-27 MED ORDER — ENOXAPARIN SODIUM 40 MG/0.4ML ~~LOC~~ SOLN
40.0000 mg | SUBCUTANEOUS | Status: DC
  Administered 2016-06-27: 40 mg via SUBCUTANEOUS
  Filled 2016-06-27: qty 0.4

## 2016-06-27 MED ORDER — AMLODIPINE BESYLATE 5 MG PO TABS
5.0000 mg | ORAL_TABLET | Freq: Every day | ORAL | Status: DC
  Administered 2016-06-28: 5 mg via ORAL
  Filled 2016-06-27: qty 1

## 2016-06-27 MED ORDER — ASPIRIN EC 325 MG PO TBEC
325.0000 mg | DELAYED_RELEASE_TABLET | Freq: Every day | ORAL | Status: DC
  Administered 2016-06-28: 325 mg via ORAL
  Filled 2016-06-27: qty 1

## 2016-06-27 MED ORDER — DOCUSATE SODIUM 100 MG PO CAPS
100.0000 mg | ORAL_CAPSULE | Freq: Two times a day (BID) | ORAL | Status: DC
  Administered 2016-06-27 – 2016-06-28 (×2): 100 mg via ORAL
  Filled 2016-06-27 (×2): qty 1

## 2016-06-27 MED ORDER — ACETAMINOPHEN 650 MG RE SUPP: 650.0000 mg | Freq: Four times a day (QID) | RECTAL | Status: DC | PRN

## 2016-06-27 NOTE — ED Triage Notes (Signed)
States had chest pain this am. Was working around the house and had pain in her chest that made her feel sob. Now only has tightness, no pain

## 2016-06-27 NOTE — ED Provider Notes (Addendum)
[facility] Emergency Department Provider Note  ____________________________________________  Time seen: Approximately 2:39 PM  I have reviewed the triage vital signs and the nursing notes.   HISTORY  Chief Complaint Chest Pain   HPI Jodi Kirby is a 70 y.o. female with a history of CAD, hyperlipidemia, HTN, back surgery, obstructive sleep apnea on CPAP who presents for evaluation of chest pain. Patient reports she's been having intermittent chest tightness since this morning. She describes it at substernally, nonradiating, tightness, currently 5/10, associated with lightheadedness and shortness of breath. Patient denies any chest pain until today. She is not a smoker. She denies fever and coughing. Patient's last LHC was in 2003 showing 20% disease in her RCA and LAD and NM stress tests in 2014 was normal. No personal or family history of blood clots, no recent travel or immobilization, no hemoptysis, no leg pain or swelling, no exogenous hormones.  Past Medical History:  Diagnosis Date  . Allergic rhinitis, cause unspecified   . Coronary artery disease   . Dysmetabolic syndrome X   . Dysuria   . Esophageal reflux   . Essential hypertension, benign   . Hyperlipidemia   . Lumbago   . Lump or mass in breast   . Myalgia and myositis, unspecified   . Obstructive sleep apnea   . Osteoarthrosis, unspecified whether generalized or localized, lower leg   . Other abnormal glucose   . Other and unspecified angina pectoris   . 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 Toxic effect of venom(989.5)   . Unspecified iridocyclitis   . Unspecified vitamin D deficiency     Patient Active Problem List   Diagnosis Date Noted  . Osteopenia 05/15/2016  . Insomnia 11/08/2015  . Angina pectoris (HCC) 07/10/2015  . Seasonal allergic rhinitis 07/09/2015  . GERD (gastroesophageal reflux disease) 07/09/2015  . Bee  sting allergy 07/09/2015  . History of knee replacement procedure of right knee 04/29/2015  . History of colonic polyps 09/24/2014  . Fibrocystic breast 09/24/2014  . Arthritis of knee, degenerative 06/12/2014  . Hyperlipidemia 02/09/2013  . Coronary artery disease 02/09/2013  . Obstructive sleep apnea on CPAP 02/09/2013  . Essential hypertension 02/09/2013    Past Surgical History:  Procedure Laterality Date  . ABDOMINAL HYSTERECTOMY  1970  . ANKLE SURGERY Right 1980  . BLADDER SURGERY  2012  . CARDIAC CATHETERIZATION  2003   [facility]  . CARDIAC CATHETERIZATION     [facility]  . COLONOSCOPY  2008, 2015   Dr. Servando SnareWohl  . CYSTOSTOMY    . KNEE SURGERY  08/18/2011   arthroscopic right  . LUMBAR FUSION     L4-5, S-1  . NECK SURGERY  2003  . TOTAL KNEE ARTHROPLASTY Right 04/29/2015   Procedure: TOTAL KNEE ARTHROPLASTY;  Surgeon: Erin SonsHarold Kernodle, MD;  Location: [facility] ORS;  Service: Orthopedics;  Laterality: Right;  . TUBAL LIGATION      Prior to Admission medications   Medication Sig Start Date End Date Taking? Authorizing Provider  alendronate (FOSAMAX) 70 MG tablet Take 1 tablet (70 mg total) by mouth once a week. 05/15/16   Alba CoryKrichna Sowles, MD  ALPRAZolam Prudy Feeler(XANAX) 0.5 MG tablet Take 1 tablet (0.5 mg total) by mouth at bedtime as needed for anxiety. 11/08/15   Alba CoryKrichna Sowles, MD  Amlodipine-Valsartan-HCTZ 512 660 03195-160-25 MG TABS Take 1 tablet by mouth daily. 05/15/16   Alba CoryKrichna Sowles, MD  aspirin 81 MG tablet Take 81 mg by  mouth daily.    Historical Provider, MD  Cholecalciferol (VITAMIN D3) 1000 UNITS CAPS Take 1 capsule by mouth daily.    Historical Provider, MD  EPINEPHrine (EPIPEN 2-PAK) 0.3 mg/0.3 mL DEVI Inject 0.3 mg into the muscle as needed.     Historical Provider, MD  fluticasone (FLONASE) 50 MCG/ACT nasal spray USE TWO SPRAY(S) IN EACH NOSTRIL AT BEDTIME 10/10/15   Alba Cory, MD  loratadine (CLARITIN) 10 MG tablet Take 1 tablet (10 mg total) by mouth daily. 07/09/15   Alba Cory,  MD  Multiple Vitamins-Minerals (CENTRUM SILVER PO) Take by mouth daily.    Historical Provider, MD  nitroGLYCERIN (NITROSTAT) 0.4 MG SL tablet Place 1 tablet (0.4 mg total) under the tongue every 5 (five) minutes as needed for chest pain. 02/09/13   Antonieta Iba, MD  omeprazole (PRILOSEC) 40 MG capsule Take 1 capsule (40 mg total) by mouth 2 (two) times daily. 01/31/16   Alba Cory, MD  ranitidine (ZANTAC) 150 MG tablet Take 1 tablet (150 mg total) by mouth 2 (two) times daily as needed for heartburn. 01/31/16   Alba Cory, MD    Allergies Niaspan [niacin er]; Oxycodone-acetaminophen; and Statins  Family History  Problem Relation Age of Onset  . Family history unknown: Yes    Social History Social History  Substance Use Topics  . Smoking status: Never Smoker  . Smokeless tobacco: Never Used  . Alcohol use No    Review of Systems  Constitutional: Negative for fever. + Lightheadedness Eyes: Negative for visual changes. ENT: Negative for sore throat. Cardiovascular: + chest tightness Respiratory: + shortness of breath. Gastrointestinal: Negative for abdominal pain, vomiting or diarrhea. Genitourinary: Negative for dysuria. Musculoskeletal: Negative for back pain. Skin: Negative for rash. Neurological: Negative for headaches, weakness or numbness.  ____________________________________________   PHYSICAL EXAM:  VITAL SIGNS: Vitals:   06/27/16 1500 06/27/16 1510  BP: (!) 152/72 (!) 164/69  Pulse: (!) 54 71  Resp: 17 18    Constitutional: Alert and oriented. Well appearing and in no apparent distress. HEENT:      Head: Normocephalic and atraumatic.         Eyes: Conjunctivae are normal. Sclera is non-icteric. EOMI. PERRL      Mouth/Throat: Mucous membranes are moist.       Neck: Supple with no signs of meningismus. Cardiovascular: Regular rate and rhythm. No murmurs, gallops, or rubs. 2+ symmetrical distal pulses are present in all extremities. No  JVD. Respiratory: Normal respiratory effort. Lungs are clear to auscultation bilaterally. No wheezes, crackles, or rhonchi.  Gastrointestinal: Soft, non tender, and non distended with positive bowel sounds. No rebound or guarding. Genitourinary: No CVA tenderness. Musculoskeletal: Nontender with normal range of motion in all extremities. No edema, cyanosis, or erythema of extremities. Neurologic: Normal speech and language. Face is symmetric. Moving all extremities. No gross focal neurologic deficits are appreciated. Skin: Skin is warm, dry and intact. No rash noted. Psychiatric: Mood and affect are normal. Speech and behavior are normal.  ____________________________________________   LABS (all labs ordered are listed, but only abnormal results are displayed)  Labs Reviewed  CBC - Abnormal; Notable for the following:       Result Value   Hemoglobin 11.8 (*)    HCT 34.2 (*)    All other components within normal limits  COMPREHENSIVE METABOLIC PANEL - Abnormal; Notable for the following:    Potassium 3.3 (*)    Glucose, Bld 115 (*)    GFR calc non Af Denyse Dago  59 (*)    All other components within normal limits  TROPONIN I   ____________________________________________  EKG  ED ECG REPORT I, Nita Sickle, the attending physician, personally viewed and interpreted this ECG.  Normal sinus rhythm with occasional PVCs, rate of 74, normal intervals, normal axis, no ST elevations or depressions, T wave flattening in III.   15:47 - Normal sinus rhythm, rate of 55, normal intervals, normal axis, no ST elevations or depressions. ____________________________________________  RADIOLOGY  CXR: Negative ____________________________________________   PROCEDURES  Procedure(s) performed: None Procedures Critical Care performed:  None ____________________________________________   INITIAL IMPRESSION / ASSESSMENT AND PLAN / ED COURSE   70 y.o. female with a history of CAD,  hyperlipidemia, HTN, back surgery, obstructive sleep apnea on CPAP who presents for evaluation of chest tightness associated with shortness of breath and lightheadedness intermittent since this morning. History is concerning for ACS. EKG with no evidence of ischemia. Patient last left heart catheter was in 2003 and showed a 20% stenosis in the RCA and LAD. Her last stress test was in 2014. Patient currently endorses 5/10 test chest tightness. Her vital signs are within normal limits, her physical exam is within normal limits. Plan for nitro, ASA, and admission for CP rule out.  Clinical Course  Comment By Time  Chest pain resolved after 1 sublingual nitroglycerin. Troponin is negative. We'll admit. Nita Sickle, MD 08/26 1517   _________________________ 3:44 PM on 06/27/2016 -----------------------------------------  Patient started to complain of CP again and a repeat EKG was unachnged with no acute ischemic changes. 1inch of nitro paste ordered. Patient remains stable. Will update Hospitalist.  Pertinent labs & imaging results that were available during my care of the patient were reviewed by me and considered in my medical decision making (see chart for details).    ____________________________________________   FINAL CLINICAL IMPRESSION(S) / ED DIAGNOSES  Final diagnoses:  Chest pain, unspecified chest pain type      NEW MEDICATIONS STARTED DURING THIS VISIT:  New Prescriptions   No medications on file     Note:  This document was prepared using Dragon voice recognition software and may include unintentional dictation errors.    Nita Sickle, MD 06/27/16 1517    Nita Sickle, MD 06/27/16 [phone]

## 2016-06-27 NOTE — H&P (Signed)
[facility] - [place] at [facility]   PATIENT NAME: Jodi Kirby    MR#:  [id number]  DATE OF BIRTH:  [birth year]  DATE OF ADMISSION:  06/27/2016  PRIMARY CARE PHYSICIAN: [name], MD   REQUESTING/REFERRING PHYSICIAN: Nita Sickle, MD  CHIEF COMPLAINT:  Chest pain  HISTORY OF PRESENT ILLNESS:  [name]  is a 70 y.o. female with a known history of Coronary artery disease, status post cardiac catheterization in year 2003 showing 20% disease in her RCA and LAD and stress test in year 2014 which was normal with no cardiac interventions, sees [facility] cardiology, essential hypertension, hyperlipidemia but allergic to statins, obstructive sleep apnea on CPAP is presenting to the ED with a chief complaint of left-sided chest pain started this morning. It is intermittent in nature with no radiation. Sharp in nature and hurts with movements but the pain was relieved after sublingual nitroglycerin. Denies any shortness of breath. Denies any fever or coughing. No other complaints. Initial troponin is negative  PAST MEDICAL HISTORY:   Past Medical History:  Diagnosis Date  . Allergic rhinitis, cause unspecified   . Coronary artery disease   . Dysmetabolic syndrome X   . Dysuria   . Esophageal reflux   . Essential hypertension, benign   . Hyperlipidemia   . Lumbago   . Lump or mass in breast   . Myalgia and myositis, unspecified   . Obstructive sleep apnea   . Osteoarthrosis, unspecified whether generalized or localized, lower leg   . Other abnormal glucose   . Other and unspecified angina pectoris   . 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 Toxic effect of venom(989.5)   . Unspecified iridocyclitis   . Unspecified vitamin D deficiency     PAST SURGICAL HISTOIRY:   Past Surgical History:  Procedure Laterality Date  . ABDOMINAL HYSTERECTOMY  1970  . ANKLE SURGERY  Right 1980  . BLADDER SURGERY  2012  . CARDIAC CATHETERIZATION  2003   [place]  . CARDIAC CATHETERIZATION     [place]  . COLONOSCOPY  2008, 2015   Dr. Servando Snare  . CYSTOSTOMY    . KNEE SURGERY  08/18/2011   arthroscopic right  . LUMBAR FUSION     L4-5, S-1  . NECK SURGERY  2003  . TOTAL KNEE ARTHROPLASTY Right 04/29/2015   Procedure: TOTAL KNEE ARTHROPLASTY;  Surgeon: Erin Sons, MD;  Location: [facility] ORS;  Service: Orthopedics;  Laterality: Right;  . TUBAL LIGATION      SOCIAL HISTORY:   Social History  Substance Use Topics  . Smoking status: Never Smoker  . Smokeless tobacco: Never Used  . Alcohol use No    FAMILY HISTORY:   Family History  Problem Relation Age of Onset  . Family history unknown: Yes  Essential hypertension runs in her family   DRUG ALLERGIES:   Allergies  Allergen Reactions  . Niaspan [Niacin Er]   . Oxycodone-Acetaminophen Nausea Only  . Statins     REVIEW OF SYSTEMS:  CONSTITUTIONAL: No fever, fatigue or weakness.  EYES: No blurred or double vision.  EARS, NOSE, AND THROAT: No tinnitus or ear pain.  RESPIRATORY: No cough, shortness of breath, wheezing or hemoptysis.  CARDIOVASCULAR: No chest pain, orthopnea, edema. Patient can locate chest pain on the left side of the chest hurts with movements, GASTROINTESTINAL: No nausea, vomiting, diarrhea or abdominal pain.  GENITOURINARY: No dysuria, hematuria.  ENDOCRINE: No  polyuria, nocturia,  HEMATOLOGY: No anemia, easy bruising or bleeding SKIN: No rash or lesion. MUSCULOSKELETAL:  has chronic left shoulder pain from arthritis  NEUROLOGIC: No tingling, numbness, weakness.  PSYCHIATRY: No anxiety or depression.   MEDICATIONS AT HOME:   Prior to Admission medications   Medication Sig Start Date End Date Taking? Authorizing Provider  alendronate (FOSAMAX) 70 MG tablet Take 1 tablet (70 mg total) by mouth once a week. 05/15/16   Alba Cory, MD  ALPRAZolam Prudy Feeler) 0.5 MG tablet Take 1 tablet  (0.5 mg total) by mouth at bedtime as needed for anxiety. 11/08/15   Alba Cory, MD  Amlodipine-Valsartan-HCTZ [id number] MG TABS Take 1 tablet by mouth daily. 05/15/16   Alba Cory, MD  aspirin 81 MG tablet Take 81 mg by mouth daily.    Historical Provider, MD  Cholecalciferol (VITAMIN D3) 1000 UNITS CAPS Take 1 capsule by mouth daily.    Historical Provider, MD  EPINEPHrine (EPIPEN 2-PAK) 0.3 mg/0.3 mL DEVI Inject 0.3 mg into the muscle as needed.     Historical Provider, MD  fluticasone (FLONASE) 50 MCG/ACT nasal spray USE TWO SPRAY(S) IN EACH NOSTRIL AT BEDTIME 10/10/15   Alba Cory, MD  loratadine (CLARITIN) 10 MG tablet Take 1 tablet (10 mg total) by mouth daily. 07/09/15   Alba Cory, MD  Multiple Vitamins-Minerals (CENTRUM SILVER PO) Take by mouth daily.    Historical Provider, MD  nitroGLYCERIN (NITROSTAT) 0.4 MG SL tablet Place 1 tablet (0.4 mg total) under the tongue every 5 (five) minutes as needed for chest pain. 02/09/13   Antonieta Iba, MD  omeprazole (PRILOSEC) 40 MG capsule Take 1 capsule (40 mg total) by mouth 2 (two) times daily. 01/31/16   Alba Cory, MD  ranitidine (ZANTAC) 150 MG tablet Take 1 tablet (150 mg total) by mouth 2 (two) times daily as needed for heartburn. 01/31/16   Alba Cory, MD      VITAL SIGNS:  Blood pressure (!) 164/69, pulse 71, resp. rate 18, height 5\' 3"  (1.6 m), weight 79.4 kg (175 lb), SpO2 91 %.  PHYSICAL EXAMINATION:  GENERAL:  70 y.o.-year-old patient lying in the bed with no acute distress.  EYES: Pupils equal, round, reactive to light and accommodation. No scleral icterus. Extraocular muscles intact.  HEENT: Head atraumatic, normocephalic. Oropharynx and nasopharynx clear.  NECK:  Supple, no jugular venous distention. No thyroid enlargement, no tenderness.  LUNGS: Normal breath sounds bilaterally, no wheezing, rales,rhonchi or crepitation. No use of accessory muscles of respiration.  CARDIOVASCULAR: S1, S2 normal. No murmurs,  rubs, or gallops. Reproducible anterior chest wall tenderness on the left side of the chest ABDOMEN: Soft, nontender, nondistended. Bowel sounds present. No organomegaly or mass.  EXTREMITIES: No pedal edema, cyanosis, or clubbing.  NEUROLOGIC: Cranial nerves II through XII are intact. Muscle strength 5/5 in all extremities. Sensation intact. Gait not checked.  PSYCHIATRIC: The patient is alert and oriented x 3.  SKIN: No obvious rash, lesion, or ulcer.   LABORATORY PANEL:   CBC  Recent Labs Lab 06/27/16 1229  WBC 9.6  HGB 11.8*  HCT 34.2*  PLT 270   ------------------------------------------------------------------------------------------------------------------  Chemistries   Recent Labs Lab 06/27/16 1229  NA 140  K 3.3*  CL 104  CO2 28  GLUCOSE 115*  BUN 14  CREATININE 0.96  CALCIUM 9.2  AST 20  ALT 25  ALKPHOS 77  BILITOT 0.3   ------------------------------------------------------------------------------------------------------------------  Cardiac Enzymes  Recent Labs Lab 06/27/16 1229  TROPONINI <0.03   ------------------------------------------------------------------------------------------------------------------  RADIOLOGY:  Dg Chest 2 View  Result Date: 06/27/2016 CLINICAL DATA:  Chest pain EXAM: CHEST  2 VIEW COMPARISON:  03/21/2015 FINDINGS: The heart size and mediastinal contours are within normal limits. Both lungs are clear. The visualized skeletal structures are unremarkable. IMPRESSION: No active cardiopulmonary disease. Electronically Signed   By: Signa Kellaylor  Stroud M.D.   On: 06/27/2016 13:23    EKG:   Orders placed or performed during the hospital encounter of 06/27/16  . ED EKG  . ED EKG  . EKG 12-Lead  . EKG 12-Lead   Initial EKG with no acute changes IMPRESSION AND PLAN:  Hazle NordmannRosely Alarid  is a 70 y.o. female with a known history of Coronary artery disease, status post cardiac catheterization in year 2003 showing 20% disease in  her RCA and LAD and stress test in year 2014 which was normal with no cardiac interventions, sees [facility] cardiology, essential hypertension, hyperlipidemia but allergic to statins, obstructive sleep apnea on CPAP is presenting to the ED with a chief complaint of left-sided chest pain started this morning, reproducible but felt better after sublingual nitroglycerin   #Chest pain-reproducible but with history of coronary artery disease and other risk factors Admit to telemetry Cycle cardiac biomarkers Provide oxygen, nitroglycerin, aspirin, started patient on small dose beta blocker. Patient is allergic to statins Check fasting lipid panel and will start her on omega-3 fatty acids  get an echocardiogram   cardiology consult is placed to patient's primary cardiology [facility]  #Hypokalemia Replete potassium and check a.m. Labs   #Essential hypertension Resume her home medication amlodipine-losartan-hydrochlorothiazide Started patient on small dose beta blocker   #History of obstructive sleep apnea continue CPAP daily at bedtime   #hyperlipidemia. Check fasting lipid panel. Patient is allergic to statins. Will start her on omega-3 fatty acid   DVT prophylaxis with Lovenox subcutaneous GI prophylaxis with Protonix          All the records are reviewed and case discussed with ED provider. Management plans discussed with the patient, family and they are in agreement.  CODE STATUS: Full code, husband is the healthcare power of attorney TOTAL TIME TAKING CARE OF THIS PATIENT: 45minutes.   Note: This dictation was prepared with Dragon dictation along with smaller phrase technology. Any transcriptional errors that result from this process are unintentional.  Ramonita LabGouru, Gloria Lambertson M.D on 06/27/2016 at 3:57 PM  Between 7am to 6pm - Pager - [phone]  After 6pm go to [email or web address] - password [email or web address]  [facility]  Office   [phone]  CC: Primary care physician; Ruel FavorsKrichna F Sowles, MD

## 2016-06-28 DIAGNOSIS — R079 Chest pain, unspecified: Secondary | ICD-10-CM | POA: Diagnosis not present

## 2016-06-28 DIAGNOSIS — I10 Essential (primary) hypertension: Secondary | ICD-10-CM | POA: Diagnosis not present

## 2016-06-28 DIAGNOSIS — I2510 Atherosclerotic heart disease of native coronary artery without angina pectoris: Secondary | ICD-10-CM | POA: Diagnosis not present

## 2016-06-28 DIAGNOSIS — E785 Hyperlipidemia, unspecified: Secondary | ICD-10-CM | POA: Diagnosis not present

## 2016-06-28 LAB — COMPREHENSIVE METABOLIC PANEL
ALBUMIN: 3.4 g/dL — AB (ref 3.5–5.0)
ALK PHOS: 65 U/L (ref 38–126)
ALT: 20 U/L (ref 14–54)
ANION GAP: 5 (ref 5–15)
AST: 18 U/L (ref 15–41)
BILIRUBIN TOTAL: 0.2 mg/dL — AB (ref 0.3–1.2)
BUN: 18 mg/dL (ref 6–20)
CALCIUM: 8.4 mg/dL — AB (ref 8.9–10.3)
CO2: 27 mmol/L (ref 22–32)
Chloride: 109 mmol/L (ref 101–111)
Creatinine, Ser: 0.9 mg/dL (ref 0.44–1.00)
GFR calc Af Amer: >60 mL/min (ref >60)
GLUCOSE: 92 mg/dL (ref 65–99)
POTASSIUM: 3.8 mmol/L (ref 3.5–5.1)
Sodium: 141 mmol/L (ref 135–145)
TOTAL PROTEIN: 5.8 g/dL — AB (ref 6.5–8.1)

## 2016-06-28 LAB — LIPID PANEL
CHOL/HDL RATIO: 3.2 ratio
CHOLESTEROL: 149 mg/dL (ref 0–200)
HDL: 46 mg/dL (ref >40)
LDL Cholesterol: 78 mg/dL (ref 0–99)
Triglycerides: 125 mg/dL (ref <150)
VLDL: 25 mg/dL (ref 0–40)

## 2016-06-28 LAB — TROPONIN I: Troponin I: <0.03 ng/mL (ref <0.03)

## 2016-06-28 LAB — CBC
HEMATOCRIT: 30.4 % — AB (ref 35.0–47.0)
HEMOGLOBIN: 10.4 g/dL — AB (ref 12.0–16.0)
MCH: 28.5 pg (ref 26.0–34.0)
MCHC: 34.3 g/dL (ref 32.0–36.0)
MCV: 83 fL (ref 80.0–100.0)
Platelets: 220 10*3/uL (ref 150–440)
RBC: 3.66 MIL/uL — ABNORMAL LOW (ref 3.80–5.20)
RDW: 12.7 % (ref 11.5–14.5)
WBC: 6.9 10*3/uL (ref 3.6–11.0)

## 2016-06-28 LAB — TSH: TSH: 1.396 u[IU]/mL (ref 0.350–4.500)

## 2016-06-28 MED ORDER — METOPROLOL TARTRATE 25 MG PO TABS
12.5000 mg | ORAL_TABLET | Freq: Two times a day (BID) | ORAL | 0 refills | Status: DC
Start: 2016-06-28 — End: 2016-06-28

## 2016-06-28 NOTE — Discharge Summary (Signed)
[facility] - [place] at [facility]   PATIENT NAME: Jodi Kirby    MR#:  [id number]  DATE OF BIRTH:  1946-03-01  DATE OF ADMISSION:  06/27/2016 ADMITTING PHYSICIAN: Ramonita Lab, MD  DATE OF DISCHARGE: 06/28/16  PRIMARY CARE PHYSICIAN: [name], MD    ADMISSION DIAGNOSIS:  Chest pain, unspecified chest pain type [R07.9]  DISCHARGE DIAGNOSIS:  Active Problems:   Chest pain   SECONDARY DIAGNOSIS:   Past Medical History:  Diagnosis Date  . Allergic rhinitis, cause unspecified   . Coronary artery disease   . Dysmetabolic syndrome X   . Dysuria   . Esophageal reflux   . Essential hypertension, benign   . Hyperlipidemia   . Lumbago   . Lump or mass in breast   . Myalgia and myositis, unspecified   . Obstructive sleep apnea   . Osteoarthrosis, unspecified whether generalized or localized, lower leg   . Other abnormal glucose   . Other and unspecified angina pectoris   . 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 Toxic effect of venom(989.5)   . Unspecified iridocyclitis   . Unspecified vitamin D deficiency     HOSPITAL COURSE:  Jodi Kirby  is a 70 y.o. female admitted 06/27/2016 with chief complaint Chest Pain . Please see H&P performed by Ramonita Lab, MD for further information. Patient presented with the above symptoms. Given history of CAD, admitted, placed on telemetry with trending of cardiac enzymes. All tests remained within normal limits, she remained chest pain free. Had normal stress in 2014, I discussed the option of staying for stress testing tomorrow versus outpatient follow up. Wishes to follow with Dr Mariah Milling as outpatient  DISCHARGE CONDITIONS:   stable  CONSULTS OBTAINED:  Treatment Team:  Ramonita Lab, MD Almond Lint, MD  DRUG ALLERGIES:   Allergies  Allergen Reactions  . Niaspan [Niacin Er] Other (See Comments)    Patient states she can't remember the  reaction because it so long ago.  . Oxycodone-Acetaminophen Nausea Only  . Statins Other (See Comments)    Muscle and joint pain.    DISCHARGE MEDICATIONS:   Current Discharge Medication List    CONTINUE these medications which have NOT CHANGED   Details  alendronate (FOSAMAX) 70 MG tablet Take 1 tablet (70 mg total) by mouth once a week. Qty: 12 tablet, Refills: 1   Associated Diagnoses: Osteopenia    ALPRAZolam (XANAX) 0.5 MG tablet Take 1 tablet (0.5 mg total) by mouth at bedtime as needed for anxiety. Qty: 30 tablet, Refills: 0   Associated Diagnoses: Insomnia    Amlodipine-Valsartan-HCTZ 5-160-25 MG TABS Take 1 tablet by mouth daily. Qty: 90 tablet, Refills: 1   Associated Diagnoses: Essential hypertension    aspirin EC 81 MG tablet Take 81 mg by mouth every morning.    Cholecalciferol (VITAMIN D3) 1000 UNITS CAPS Take 1,000 Units by mouth every morning.     EPINEPHrine (EPIPEN 2-PAK) 0.3 mg/0.3 mL DEVI Inject 0.3 mg into the muscle as needed.     fluticasone (FLONASE) 50 MCG/ACT nasal spray USE TWO SPRAY(S) IN EACH NOSTRIL AT BEDTIME Qty: 48 g, Refills: 0    loratadine (CLARITIN) 10 MG tablet Take 1 tablet (10 mg total) by mouth daily. Qty: 30 tablet, Refills: 5   Associated Diagnoses: Seasonal allergic rhinitis    Multiple Vitamins-Minerals (CENTRUM SILVER PO) Take 1 tablet by mouth every morning.     nitroGLYCERIN (NITROSTAT) 0.4 MG  SL tablet Place 1 tablet (0.4 mg total) under the tongue every 5 (five) minutes as needed for chest pain. Qty: 25 tablet, Refills: 6    omeprazole (PRILOSEC) 40 MG capsule Take 1 capsule (40 mg total) by mouth 2 (two) times daily. Qty: 180 capsule, Refills: 1      STOP taking these medications     ranitidine (ZANTAC) 150 MG tablet          DISCHARGE INSTRUCTIONS:    DIET:  Cardiac diet  DISCHARGE CONDITION:  Stable  ACTIVITY:  Activity as tolerated  OXYGEN:  Home Oxygen: No.   Oxygen Delivery: room  air  DISCHARGE LOCATION:  home   If you experience worsening of your admission symptoms, develop shortness of breath, life threatening emergency, suicidal or homicidal thoughts you must seek medical attention immediately by calling 911 or calling your MD immediately  if symptoms less severe.  You Must read complete instructions/literature along with all the possible adverse reactions/side effects for all the Medicines you take and that have been prescribed to you. Take any new Medicines after you have completely understood and accpet all the possible adverse reactions/side effects.   Please note  You were cared for by a hospitalist during your hospital stay. If you have any questions about your discharge medications or the care you received while you were in the hospital after you are discharged, you can call the unit and asked to speak with the hospitalist on call if the hospitalist that took care of you is not available. Once you are discharged, your primary care physician will handle any further medical issues. Please note that NO REFILLS for any discharge medications will be authorized once you are discharged, as it is imperative that you return to your primary care physician (or establish a relationship with a primary care physician if you do not have one) for your aftercare needs so that they can reassess your need for medications and monitor your lab values.    On the day of Discharge:   VITAL SIGNS:  Blood pressure 138/78, pulse (!) 55, temperature 99.2 F (37.3 C), temperature source Oral, resp. rate 16, height 5\' 3"  (1.6 m), weight 78.1 kg (172 lb 3.2 oz), SpO2 100 %.  I/O:   Intake/Output Summary (Last 24 hours) at 06/28/16 1017 Last data filed at 06/28/16 0543  Gross per 24 hour  Intake                0 ml  Output              900 ml  Net             -900 ml    PHYSICAL EXAMINATION:  GENERAL:  70 y.o.-year-old patient lying in the bed with no acute distress.  EYES:  Pupils equal, round, reactive to light and accommodation. No scleral icterus. Extraocular muscles intact.  HEENT: Head atraumatic, normocephalic. Oropharynx and nasopharynx clear.  NECK:  Supple, no jugular venous distention. No thyroid enlargement, no tenderness.  LUNGS: Normal breath sounds bilaterally, no wheezing, rales,rhonchi or crepitation. No use of accessory muscles of respiration.  CARDIOVASCULAR: S1, S2 normal. No murmurs, rubs, or gallops.  ABDOMEN: Soft, non-tender, non-distended. Bowel sounds present. No organomegaly or mass.  EXTREMITIES: No pedal edema, cyanosis, or clubbing.  NEUROLOGIC: Cranial nerves II through XII are intact. Muscle strength 5/5 in all extremities. Sensation intact. Gait not checked.  PSYCHIATRIC: The patient is alert and oriented x 3.  SKIN: No obvious rash,  lesion, or ulcer.   DATA REVIEW:   CBC  Recent Labs Lab 06/28/16 0535  WBC 6.9  HGB 10.4*  HCT 30.4*  PLT 220    Chemistries   Recent Labs Lab 06/28/16 0535  NA 141  K 3.8  CL 109  CO2 27  GLUCOSE 92  BUN 18  CREATININE 0.90  CALCIUM 8.4*  AST 18  ALT 20  ALKPHOS 65  BILITOT 0.2*    Cardiac Enzymes  Recent Labs Lab 06/28/16 0535  TROPONINI <0.03    Microbiology Results  Results for orders placed or performed during the hospital encounter of 04/29/15  Surgical pcr screen     Status: None   Collection Time: 04/18/15 10:50 AM  Result Value Ref Range Status   MRSA, PCR NEGATIVE NEGATIVE Final   Staphylococcus aureus NEGATIVE NEGATIVE Final    Comment:        The Xpert SA Assay (FDA approved for NASAL specimens in patients over 85 years of age), is one component of a comprehensive surveillance program.  Test performance has been validated by [facility] for patients greater than or equal to 45 year old. It is not intended to diagnose infection nor to guide or monitor treatment.     RADIOLOGY:  Dg Chest 2 View  Result Date: 06/27/2016 CLINICAL DATA:  Chest  pain EXAM: CHEST  2 VIEW COMPARISON:  03/21/2015 FINDINGS: The heart size and mediastinal contours are within normal limits. Both lungs are clear. The visualized skeletal structures are unremarkable. IMPRESSION: No active cardiopulmonary disease. Electronically Signed   By: Signa Kell M.D.   On: 06/27/2016 13:23     Management plans discussed with the patient, family and they are in agreement.  CODE STATUS:     Code Status Orders        Start     Ordered   06/27/16 1749  Full code  Continuous     06/27/16 1748    Code Status History    Date Active Date Inactive Code Status Order ID Comments User Context   04/29/2015 12:51 PM 05/02/2015  4:38 PM Full Code [id number]  Erin Sons, MD Inpatient      TOTAL TIME TAKING CARE OF THIS PATIENT: 33 minutes.    Hower,  Mardi Mainland.D on 06/28/2016 at 10:17 AM  Between 7am to 6pm - Pager - [phone]  After 6pm go to [email or web address] - Social research officer, government  [facility] [place] [facility]  Office  [phone]  CC: Primary care physician; [name], MD

## 2016-06-28 NOTE — Progress Notes (Signed)
IV and tele removed. Nitro patch removed. Patient discharged via wheelchair by tech.

## 2016-06-28 NOTE — Progress Notes (Signed)
Patient uses CPAP at home which she did not bring to the hospital. She said she didn't want to use the hospital equipment this night and will bring her home unit should need arise.

## 2016-07-01 ENCOUNTER — Ambulatory Visit (INDEPENDENT_AMBULATORY_CARE_PROVIDER_SITE_OTHER): Payer: Medicare PPO | Admitting: Family Medicine

## 2016-07-01 ENCOUNTER — Encounter: Payer: Self-pay | Admitting: Family Medicine

## 2016-07-01 VITALS — BP 114/62 | HR 80 | Temp 98.9°F | Resp 14 | Ht 63.0 in | Wt 177.7 lb

## 2016-07-01 DIAGNOSIS — D649 Anemia, unspecified: Secondary | ICD-10-CM | POA: Diagnosis not present

## 2016-07-01 DIAGNOSIS — E876 Hypokalemia: Secondary | ICD-10-CM

## 2016-07-01 DIAGNOSIS — I209 Angina pectoris, unspecified: Secondary | ICD-10-CM | POA: Diagnosis not present

## 2016-07-01 DIAGNOSIS — Z09 Encounter for follow-up examination after completed treatment for conditions other than malignant neoplasm: Secondary | ICD-10-CM

## 2016-07-01 DIAGNOSIS — I2510 Atherosclerotic heart disease of native coronary artery without angina pectoris: Secondary | ICD-10-CM | POA: Diagnosis not present

## 2016-07-01 DIAGNOSIS — I2583 Coronary atherosclerosis due to lipid rich plaque: Secondary | ICD-10-CM

## 2016-07-01 LAB — HEMOGLOBIN AND HEMATOCRIT, BLOOD
HEMATOCRIT: 34.1 % — AB (ref 35.0–45.0)
HEMOGLOBIN: 11.2 g/dL — AB (ref 11.7–15.5)

## 2016-07-01 LAB — POTASSIUM: Potassium: 3.6 mmol/L (ref 3.5–5.3)

## 2016-07-01 NOTE — Progress Notes (Signed)
Name: Jodi Kirby   MRN: [id number]    DOB: 27-Jan-1946   Date:07/01/2016       Progress Note  Subjective  Chief Complaint  Chief Complaint  Patient presents with  . Hospitalization Follow-up    chest pain    HPI  Chest pain: she went to [facility] on 06/27/2016 for evaluation of chest pain, described as tightness and dyspnea. She went by private vehicle ( explained importance of going by EMS next time ), she had mild hypokalemia and anemia upon arrival, cardiac enzymes were negative and she was discharged home on the same medications and follow up with me and Dr. Rockey Situ for further outpatient evaluation. She continues to have a flush sensation inside her chest intermittently, episodes are not as frequent but triggered by activity. Her bp used to be in the 130's but is low today. She has a history of bursitis of left shoulder, and seen by Dr, Jefm Bryant about 6 months ago, had steroid injections and symptoms are returning now with pain that radiates to left side of neck and anterior chest  -however pain is different than the chest pain  Patient Active Problem List   Diagnosis Date Noted  . Chest pain 06/27/2016  . Osteopenia 05/15/2016  . Insomnia 11/08/2015  . Angina pectoris ([place]) 07/10/2015  . Seasonal allergic rhinitis 07/09/2015  . GERD (gastroesophageal reflux disease) 07/09/2015  . Bee sting allergy 07/09/2015  . History of knee replacement procedure of right knee 04/29/2015  . History of colonic polyps 09/24/2014  . Fibrocystic breast 09/24/2014  . Arthritis of knee, degenerative 06/12/2014  . Hyperlipidemia 02/09/2013  . Coronary artery disease 02/09/2013  . Obstructive sleep apnea on CPAP 02/09/2013  . Essential hypertension 02/09/2013    Past Surgical History:  Procedure Laterality Date  . ABDOMINAL HYSTERECTOMY  1970  . ANKLE SURGERY Right 1980  . BLADDER SURGERY  2012  . CARDIAC CATHETERIZATION  2003   [place]  . CARDIAC CATHETERIZATION     [place]  .  COLONOSCOPY  2008, 2015   Dr. Allen Norris  . CYSTOSTOMY    . KNEE SURGERY  08/18/2011   arthroscopic right  . LUMBAR FUSION     L4-5, S-1  . NECK SURGERY  2003  . TOTAL KNEE ARTHROPLASTY Right 04/29/2015   Procedure: TOTAL KNEE ARTHROPLASTY;  Surgeon: Leanor Kail, MD;  Location: [facility] ORS;  Service: Orthopedics;  Laterality: Right;  . TUBAL LIGATION      Family History  Problem Relation Age of Onset  . Family history unknown: Yes    Social History   Social History  . Marital status: Married    Spouse name: N/A  . Number of children: N/A  . Years of education: N/A   Occupational History  . Not on file.   Social History Main Topics  . Smoking status: Never Smoker  . Smokeless tobacco: Never Used  . Alcohol use No  . Drug use: No  . Sexual activity: Yes   Other Topics Concern  . Not on file   Social History Narrative  . No narrative on file     Current Outpatient Prescriptions:  .  alendronate (FOSAMAX) 70 MG tablet, Take 1 tablet (70 mg total) by mouth once a week. (Patient taking differently: Take 70 mg by mouth once a week. Take on Thursday.), Disp: 12 tablet, Rfl: 1 .  ALPRAZolam (XANAX) 0.5 MG tablet, Take 1 tablet (0.5 mg total) by mouth at bedtime as needed for anxiety., Disp: 30  tablet, Rfl: 0 .  Amlodipine-Valsartan-HCTZ 5-160-25 MG TABS, Take 1 tablet by mouth daily. (Patient taking differently: Take 1 tablet by mouth every morning. ), Disp: 90 tablet, Rfl: 1 .  aspirin EC 81 MG tablet, Take 81 mg by mouth every morning., Disp: , Rfl:  .  Cholecalciferol (VITAMIN D3) 1000 UNITS CAPS, Take 1,000 Units by mouth every morning. , Disp: , Rfl:  .  EPINEPHrine (EPIPEN 2-PAK) 0.3 mg/0.3 mL DEVI, Inject 0.3 mg into the muscle as needed. , Disp: , Rfl:  .  fluticasone (FLONASE) 50 MCG/ACT nasal spray, USE TWO SPRAY(S) IN EACH NOSTRIL AT BEDTIME (Patient taking differently: USE TWO SPRAY(S) IN EACH NOSTRIL AT BEDTIME AS NEEDED FOR RHINITIS.), Disp: 48 g, Rfl: 0 .   loratadine (CLARITIN) 10 MG tablet, Take 1 tablet (10 mg total) by mouth daily. (Patient taking differently: Take 10 mg by mouth daily as needed for allergies. ), Disp: 30 tablet, Rfl: 5 .  Multiple Vitamins-Minerals (CENTRUM SILVER PO), Take 1 tablet by mouth every morning. , Disp: , Rfl:  .  nitroGLYCERIN (NITROSTAT) 0.4 MG SL tablet, Place 1 tablet (0.4 mg total) under the tongue every 5 (five) minutes as needed for chest pain., Disp: 25 tablet, Rfl: 6 .  omeprazole (PRILOSEC) 40 MG capsule, Take 1 capsule (40 mg total) by mouth 2 (two) times daily., Disp: 180 capsule, Rfl: 1  Allergies  Allergen Reactions  . Niaspan [Niacin Er] Other (See Comments)    Patient states she can't remember the reaction because it so long ago.  . Oxycodone-Acetaminophen Nausea Only  . Statins Other (See Comments)    Muscle and joint pain.     ROS  Ten systems reviewed and is negative except as mentioned in HPI   Objective  Vitals:   07/01/16 1415  BP: 116/60  Pulse: 80  Resp: 14  Temp: 98.9 F (37.2 C)  TempSrc: Oral  SpO2: 96%  Weight: 177 lb 11.2 oz (80.6 kg)  Height: 5' 3"  (1.6 m)    Body mass index is 31.48 kg/m.  Physical Exam  Constitutional: Patient appears well-developed and well-nourished. Obese No distress.  HEENT: head atraumatic, normocephalic, pupils equal and reactive to light,  neck supple, throat within normal limits Cardiovascular: Normal rate, regular rhythm and normal heart sounds.  No murmur heard. No BLE edema. Pulmonary/Chest: Effort normal and breath sounds normal. No respiratory distress. Abdominal: Soft.  There is no tenderness. Psychiatric: Patient has a normal mood and affect. behavior is normal. Judgment and thought content normal.  Recent Results (from the past 2160 hour(s))  CBC     Status: Abnormal   Collection Time: 06/27/16 12:29 PM  Result Value Ref Range   WBC 9.6 3.6 - 11.0 K/uL   RBC 4.08 3.80 - 5.20 MIL/uL   Hemoglobin 11.8 (L) 12.0 - 16.0 g/dL    HCT 34.2 (L) 35.0 - 47.0 %   MCV 83.8 80.0 - 100.0 fL   MCH 28.8 26.0 - 34.0 pg   MCHC 34.4 32.0 - 36.0 g/dL   RDW 12.6 11.5 - 14.5 %   Platelets 270 150 - 440 K/uL  Comprehensive metabolic panel     Status: Abnormal   Collection Time: 06/27/16 12:29 PM  Result Value Ref Range   Sodium 140 135 - 145 mmol/L   Potassium 3.3 (L) 3.5 - 5.1 mmol/L   Chloride 104 101 - 111 mmol/L   CO2 28 22 - 32 mmol/L   Glucose, Bld 115 (H) 65 - 99  mg/dL   BUN 14 6 - 20 mg/dL   Creatinine, Ser 0.96 0.44 - 1.00 mg/dL   Calcium 9.2 8.9 - 10.3 mg/dL   Total Protein 6.8 6.5 - 8.1 g/dL   Albumin 4.3 3.5 - 5.0 g/dL   AST 20 15 - 41 U/L   ALT 25 14 - 54 U/L   Alkaline Phosphatase 77 38 - 126 U/L   Total Bilirubin 0.3 0.3 - 1.2 mg/dL   GFR calc non Af Amer 59 (L) >60 mL/min   GFR calc Af Amer >60 >60 mL/min    Comment: (NOTE) The eGFR has been calculated using the CKD EPI equation. This calculation has not been validated in all clinical situations. eGFR's persistently <60 mL/min signify possible Chronic Kidney Disease.    Anion gap 8 5 - 15  Troponin I     Status: None   Collection Time: 06/27/16 12:29 PM  Result Value Ref Range   Troponin I <0.03 <0.03 ng/mL  Troponin I     Status: None   Collection Time: 06/27/16  5:53 PM  Result Value Ref Range   Troponin I <0.03 <0.03 ng/mL  Troponin I     Status: None   Collection Time: 06/27/16 11:19 PM  Result Value Ref Range   Troponin I <0.03 <0.03 ng/mL  Troponin I     Status: None   Collection Time: 06/28/16  5:35 AM  Result Value Ref Range   Troponin I <0.03 <0.03 ng/mL  TSH     Status: None   Collection Time: 06/28/16  5:35 AM  Result Value Ref Range   TSH 1.396 0.350 - 4.500 uIU/mL  CBC     Status: Abnormal   Collection Time: 06/28/16  5:35 AM  Result Value Ref Range   WBC 6.9 3.6 - 11.0 K/uL   RBC 3.66 (L) 3.80 - 5.20 MIL/uL   Hemoglobin 10.4 (L) 12.0 - 16.0 g/dL   HCT 30.4 (L) 35.0 - 47.0 %   MCV 83.0 80.0 - 100.0 fL   MCH 28.5 26.0  - 34.0 pg   MCHC 34.3 32.0 - 36.0 g/dL   RDW 12.7 11.5 - 14.5 %   Platelets 220 150 - 440 K/uL  Comprehensive metabolic panel     Status: Abnormal   Collection Time: 06/28/16  5:35 AM  Result Value Ref Range   Sodium 141 135 - 145 mmol/L   Potassium 3.8 3.5 - 5.1 mmol/L   Chloride 109 101 - 111 mmol/L   CO2 27 22 - 32 mmol/L   Glucose, Bld 92 65 - 99 mg/dL   BUN 18 6 - 20 mg/dL   Creatinine, Ser 0.90 0.44 - 1.00 mg/dL   Calcium 8.4 (L) 8.9 - 10.3 mg/dL   Total Protein 5.8 (L) 6.5 - 8.1 g/dL   Albumin 3.4 (L) 3.5 - 5.0 g/dL   AST 18 15 - 41 U/L   ALT 20 14 - 54 U/L   Alkaline Phosphatase 65 38 - 126 U/L   Total Bilirubin 0.2 (L) 0.3 - 1.2 mg/dL   GFR calc non Af Amer >60 >60 mL/min   GFR calc Af Amer >60 >60 mL/min    Comment: (NOTE) The eGFR has been calculated using the CKD EPI equation. This calculation has not been validated in all clinical situations. eGFR's persistently <60 mL/min signify possible Chronic Kidney Disease.    Anion gap 5 5 - 15  Lipid panel     Status: None   Collection Time: 06/28/16  5:35 AM  Result Value Ref Range   Cholesterol 149 0 - 200 mg/dL   Triglycerides 125 <150 mg/dL   HDL 46 >40 mg/dL   Total CHOL/HDL Ratio 3.2 RATIO   VLDL 25 0 - 40 mg/dL   LDL Cholesterol 78 0 - 99 mg/dL    Comment:        Total Cholesterol/HDL:CHD Risk Coronary Heart Disease Risk Table                     Men   Women  1/2 Average Risk   3.4   3.3  Average Risk       5.0   4.4  2 X Average Risk   9.6   7.1  3 X Average Risk  23.4   11.0        Use the calculated Patient Ratio above and the CHD Risk Table to determine the patient's CHD Risk.        ATP III CLASSIFICATION (LDL):  <100     mg/dL   Optimal  100-129  mg/dL   Near or Above                    Optimal  130-159  mg/dL   Borderline  160-189  mg/dL   High  >190     mg/dL   Very High       PHQ2/9: Depression screen [facility] 2/9 05/15/2016 01/14/2016 12/09/2015 11/08/2015 07/09/2015  Decreased Interest 0 0 0  0 0  Down, Depressed, Hopeless 0 0 0 0 0  PHQ - 2 Score 0 0 0 0 0    Fall Risk: Fall Risk  05/15/2016 01/14/2016 12/09/2015 11/08/2015 07/09/2015  Falls in the past year? No No No No No      Assessment & Plan  1. Angina pectoris ([place])  Keep follow up with Dr. Rockey Situ , call 911 if symptoms returns  2. Coronary artery disease due to lipid rich plaque  Continue statin therapy and aspirin, not on beta-blocker - she has a long history of bradycardia  3. Hospital discharge follow-up  bp is low, we checked it twice, advised her to only take half pill of Amlodipine/Valsartan/HCTZ tomorrow and monitor bp- call if above 140/90. It may be the cause of her sensation on her chest. Also advised her to follow up with Ortho to take care of her left shoulder pain

## 2016-07-07 ENCOUNTER — Ambulatory Visit: Payer: Medicare PPO

## 2016-07-07 VITALS — BP 142/68 | HR 63

## 2016-07-07 DIAGNOSIS — I10 Essential (primary) hypertension: Secondary | ICD-10-CM

## 2016-07-08 ENCOUNTER — Other Ambulatory Visit: Payer: Self-pay | Admitting: Cardiovascular Disease

## 2016-07-08 ENCOUNTER — Other Ambulatory Visit: Payer: Self-pay | Admitting: *Deleted

## 2016-07-08 MED ORDER — NITROGLYCERIN 0.4 MG SL SUBL: 0.4000 mg | SUBLINGUAL_TABLET | SUBLINGUAL | 0 refills | Status: DC | PRN

## 2016-07-13 ENCOUNTER — Encounter: Payer: Self-pay | Admitting: Student

## 2016-07-13 ENCOUNTER — Ambulatory Visit (INDEPENDENT_AMBULATORY_CARE_PROVIDER_SITE_OTHER): Payer: Medicare PPO | Admitting: Student

## 2016-07-13 VITALS — BP 140/62 | HR 60 | Ht 63.0 in | Wt 181.5 lb

## 2016-07-13 DIAGNOSIS — I10 Essential (primary) hypertension: Secondary | ICD-10-CM | POA: Diagnosis not present

## 2016-07-13 DIAGNOSIS — R079 Chest pain, unspecified: Secondary | ICD-10-CM

## 2016-07-13 DIAGNOSIS — I2583 Coronary atherosclerosis due to lipid rich plaque: Secondary | ICD-10-CM

## 2016-07-13 DIAGNOSIS — E785 Hyperlipidemia, unspecified: Secondary | ICD-10-CM

## 2016-07-13 DIAGNOSIS — I2510 Atherosclerotic heart disease of native coronary artery without angina pectoris: Secondary | ICD-10-CM | POA: Diagnosis not present

## 2016-07-13 DIAGNOSIS — R0609 Other forms of dyspnea: Secondary | ICD-10-CM | POA: Diagnosis not present

## 2016-07-13 NOTE — Progress Notes (Signed)
Cardiology Office Note    Date:  07/13/2016   ID:  Jodi Kirby, DOB 1946-04-02, MRN [id number]  PCP:  Ruel FavorsKrichna F Sowles, MD  Primary Cardiologist:  Dr. Mariah MillingGollan  Chief Complaint  Patient presents with  . Other    12 month follow up. Meds reviewed by the patient verbally. Dyspnea with Exertion    History of Present Illness:    Jodi Kirby is a 70 y.o. female with past medical history of nonobstructive CAD (by cath in 2003, normal NST in 08/2013), HTN, HLD (intoelrant to statins), and OSA (on CPAP) who presents today for annual follow-up and worsening dyspnea with exertion.   She was recently admitted at [facility] from 8/26 - 06/28/2016 for chest pain. Notes at that time report her pain was sharp and worse with movements. Cyclic troponin values remained negative and EKG was without acute ischemic changes. TSH was 1.396 and Lipid Panel showed total cholesterol of 149, HDL 46, and LDL 78.  Today, she reports doing well since hospital discharge. She went to the gym the day following her discharge and had an episode of intense shooting chest pain which resolved within minutes. Denies any episodes of chest pain since.   She does note episodes of dyspnea with exertion. She goes to the gym several times per week and performs aerobic exercise and lifts free weights. Now experiencing increased dyspnea with these activities. On days she does not go to the gym, she ambulates to the mailbox four times and notes dyspnea with this as well. Says this has progressed over the past two months. Denies any associated chest discomfort or palpitations.   During her recent hospitalization, she reports her BP was low and she was instructed to take half of her Amlodipine-Valsartan-HCTZ at time of discharge. She has been checking her BP at home and brings these recordings for review today. SBP has been in the 110's - 130's. On one occasion, it was 145 but this was the only instance. She says she "feels better"  since taking half of the medication. Felt "weak" when taking the full dose. Denies any lightheadedness, dizziness, or pre-syncope.    Past Medical History:  Diagnosis Date  . Allergic rhinitis, cause unspecified   . Coronary artery disease   . Dysmetabolic syndrome X   . Dysuria   . Esophageal reflux   . Essential hypertension, benign   . Hyperlipidemia   . Lumbago   . Lump or mass in breast   . Myalgia and myositis, unspecified   . Obstructive sleep apnea   . Osteoarthrosis, unspecified whether generalized or localized, lower leg   . Other abnormal glucose   . Other and unspecified angina pectoris   . 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 Toxic effect of venom(989.5)   . Unspecified iridocyclitis   . Unspecified vitamin D deficiency     Past Surgical History:  Procedure Laterality Date  . ABDOMINAL HYSTERECTOMY  1970  . ANKLE SURGERY Right 1980  . BLADDER SURGERY  2012  . CARDIAC CATHETERIZATION  2003   [place]  . CARDIAC CATHETERIZATION     [place]  . COLONOSCOPY  2008, 2015   Dr. Servando SnareWohl  . CYSTOSTOMY    . KNEE SURGERY  08/18/2011   arthroscopic right  . LUMBAR FUSION     L4-5, S-1  . NECK SURGERY  2003  . TOTAL KNEE ARTHROPLASTY Right 04/29/2015   Procedure: TOTAL KNEE ARTHROPLASTY;  Surgeon:  Erin Sons, MD;  Location: [facility] ORS;  Service: Orthopedics;  Laterality: Right;  . TUBAL LIGATION      Current Medications: Outpatient Medications Prior to Visit  Medication Sig Dispense Refill  . alendronate (FOSAMAX) 70 MG tablet Take 1 tablet (70 mg total) by mouth once a week. (Patient taking differently: Take 70 mg by mouth once a week. Take on Thursday.) 12 tablet 1  . ALPRAZolam (XANAX) 0.5 MG tablet Take 1 tablet (0.5 mg total) by mouth at bedtime as needed for anxiety. 30 tablet 0  . Amlodipine-Valsartan-HCTZ 5-160-25 MG TABS Take 1 tablet by mouth daily. (Patient taking differently: Take 1 tablet by mouth  every morning. ) 90 tablet 1  . aspirin EC 81 MG tablet Take 81 mg by mouth every morning.    . Cholecalciferol (VITAMIN D3) 1000 UNITS CAPS Take 1,000 Units by mouth every morning.     Marland Kitchen EPINEPHrine (EPIPEN 2-PAK) 0.3 mg/0.3 mL DEVI Inject 0.3 mg into the muscle as needed.     . fluticasone (FLONASE) 50 MCG/ACT nasal spray USE TWO SPRAY(S) IN EACH NOSTRIL AT BEDTIME (Patient taking differently: USE TWO SPRAY(S) IN EACH NOSTRIL AT BEDTIME AS NEEDED FOR RHINITIS.) 48 g 0  . loratadine (CLARITIN) 10 MG tablet Take 1 tablet (10 mg total) by mouth daily. (Patient taking differently: Take 10 mg by mouth daily as needed for allergies. ) 30 tablet 5  . Multiple Vitamins-Minerals (CENTRUM SILVER PO) Take 1 tablet by mouth every morning.     . nitroGLYCERIN (NITROSTAT) 0.4 MG SL tablet Place 1 tablet (0.4 mg total) under the tongue every 5 (five) minutes as needed for chest pain. 25 tablet 0  . omeprazole (PRILOSEC) 40 MG capsule Take 1 capsule (40 mg total) by mouth 2 (two) times daily. 180 capsule 1   No facility-administered medications prior to visit.      Allergies:   Niaspan [niacin er]; Oxycodone-acetaminophen; and Statins   Social History   Social History  . Marital status: Married    Spouse name: N/A  . Number of children: N/A  . Years of education: N/A   Social History Main Topics  . Smoking status: Never Smoker  . Smokeless tobacco: Never Used  . Alcohol use No  . Drug use: No  . Sexual activity: Yes   Other Topics Concern  . None   Social History Narrative  . None     Family History:  The patient's family history includes CAD in her father.   Review of Systems:   Please see the history of present illness.    Review of Systems  Constitution: Negative for chills, decreased appetite and fever.  Cardiovascular: Positive for chest pain and dyspnea on exertion. Negative for irregular heartbeat, leg swelling, palpitations and syncope.  Respiratory: Positive for shortness of  breath. Negative for cough and wheezing.   Skin: Negative for dry skin and rash.  Musculoskeletal: Positive for arthritis. Negative for joint swelling and muscle weakness.  Gastrointestinal: Negative for heartburn, nausea and vomiting.  Neurological: Negative for dizziness, light-headedness, loss of balance and vertigo.   All other systems reviewed and are negative.   Physical Exam:    VS:  BP 140/62 (BP Location: Left Arm, Patient Position: Sitting, Cuff Size: Normal)   Pulse 60   Ht 5\' 3"  (1.6 m)   Wt 181 lb 8 oz (82.3 kg)   BMI 32.15 kg/m    General: Well developed, well nourished,female appearing in no acute distress. Head: Normocephalic, atraumatic, sclera  non-icteric, no xanthomas, nares are without discharge.  Neck: No carotid bruits. JVD not elevated.  Lungs: Respirations regular and unlabored, without wheezes or rales.  Heart: Regular rate and rhythm. No S3 or S4.  No murmur, no rubs, or gallops appreciated. Abdomen: Soft, non-tender, non-distended with normoactive bowel sounds. No hepatomegaly. No rebound/guarding. No obvious abdominal masses. Msk:  Strength and tone appear normal for age. No joint deformities or effusions. Extremities: No clubbing or cyanosis. No edema.  Distal pedal pulses are 2+ bilaterally. Neuro: Alert and oriented X 3. Moves all extremities spontaneously. No focal deficits noted. Psych:  Responds to questions appropriately with a normal affect. Skin: No rashes or lesions noted  Wt Readings from Last 3 Encounters:  07/13/16 181 lb 8 oz (82.3 kg)  07/01/16 177 lb 11.2 oz (80.6 kg)  06/27/16 172 lb 3.2 oz (78.1 kg)      Studies/Labs Reviewed:   EKG:  EKG is ordered today.  The ekg ordered today demonstrates NSR, HR 60, with PVC's. No acute ST or T-wave changes.  Recent Labs: 06/28/2016: ALT 20; BUN 18; Creatinine, Ser 0.90; Platelets 220; Sodium 141; TSH 1.396 07/01/2016: Hemoglobin 11.2; Potassium 3.6   Lipid Panel    Component Value Date/Time    CHOL 149 06/28/2016 0535   CHOL 211 (H) 11/08/2015 1101   TRIG 125 06/28/2016 0535   HDL 46 06/28/2016 0535   HDL 48 11/08/2015 1101   CHOLHDL 3.2 06/28/2016 0535   VLDL 25 06/28/2016 0535   LDLCALC 78 06/28/2016 0535   LDLCALC 123 (H) 11/08/2015 1101    Additional studies/ records that were reviewed today include:  Cardiac Catheterization: 12/2001 - LAD with 20% stenosis, RCA with 20% stenosis. LM and LCx normal. EF estimated at 64%.  Assessment:    1. DOE (dyspnea on exertion)   2. Chest pain, unspecified chest pain type   3. Coronary artery disease due to lipid rich plaque   4. Essential hypertension   5. HLD (hyperlipidemia)      Plan:   In order of problems listed above:  1. Dyspnea on Exertion - recently hospitalized with atypical chest pain but notes having worsening dyspnea with exertion for the past 2 months. Notes this with exercising at the gym and walking to her mailbox. Symptoms resolve with rest and she denies any associated chest pain or palpitations. To me, this is more concerning than her atypical CP symptoms she was recently admitted for. Denies any orthopnea, PND or lower extremity edema. - with her new onset DOE, will obtain Treadmill Myoview for initial ischemic evaluation. Patient walks on a treadmill and uses Eliptical on a daily basis. Will attempt Treadmill. If unable to achieve target HR, patient aware we will need to convert to a [employer].   2. Chest Pain - admitted from 8/26 - 06/28/2016 for chest pain. Overall this sounds atypical for a cardiac etiology, in that it was sharp and worse with movements. EKG was without changes and cyclic troponin values were negative. - EKG today does not show any acute ST or T-wave changes - she did develop a repeat episode of chest discomfort with exertion the day after hospital discharge but denies any repeat episodes since. Does report dyspnea with exertion as above.  - will obtain Treadmill Myoview for  further ischemic evaluation.   3. Coronary Artery Disease - cath in 2003 with minimal 20% disease in RCA and LAD. Normal Myoview in 2014. - repeat testing as above.   4. Essential HTN -  had episodes of hypotension during recent hospitalization and Amlodipine-Valsartan-HCTZ dosing was decreased. Now taking half of the 5-160-25mg  tablet.  - records reviewed that patient has been keeping and SBP has been in 110's -130's with only one reading in 140's. Reports her "weakness" has improved with reduced dosing. - will continue current dosage. Patient encouraged to continue checking BP at home and if values are > 140 consistently, please let us or her PCP know so we can make dose adjustments.   5. HLD - recent Lipid Panel in 06/2016 showed total cholesterol of 149, HDL 46, and LDL 78. At goal of LDL < 100. Has been intolerant to statins in the past.  Zetia is not financially reasonable for her.  - continue with diet and exercise management.    Will obtain Exercise Myoview. If normal, can have routine follow-up with Dr. Mariah Milling in 6 months.    Medication Adjustments/Labs and Tests Ordered: Current medicines are reviewed at length with the patient today.  Concerns regarding medicines are outlined above.  Medication changes, Labs and Tests ordered today are listed in the Patient Instructions below. Patient Instructions  Medication Instructions:  Please continue your current medications  Labwork: None  Testing/Procedures: [facility] MYOVIEW  Your caregiver has ordered a Stress Test with nuclear imaging. The purpose of this test is to evaluate the blood supply to your heart muscle. This procedure is referred to as a "Non-Invasive Stress Test." This is because other than having an IV started in your vein, nothing is inserted or "invades" your body. Cardiac stress tests are done to find areas of poor blood flow to the heart by determining the extent of coronary artery disease (CAD). Some patients exercise  on a treadmill, which naturally increases the blood flow to your heart, while others who are  unable to walk on a treadmill due to physical limitations have a pharmacologic/chemical stress agent called Lexiscan . This medicine will mimic walking on a treadmill by temporarily increasing your coronary blood flow.   Please note: these test may take anywhere between 2-4 hours to complete  PLEASE REPORT TO [facility] MEDICAL MALL ENTRANCE  THE VOLUNTEERS AT THE FIRST DESK WILL DIRECT YOU WHERE TO GO  Date of Procedure:_____Tuesday, September 26_____  Arrival Time for Procedure:_____7:15 am____________   How to prepare for your Myoview test:  1. Do not eat or drink after midnight 2. No caffeine for 24 hours prior to test 3. No smoking 24 hours prior to test. 4. Your medication may be taken with water.  If your doctor stopped a medication because of this test, do not take that medication. 5. Ladies, please do not wear dresses.  Skirts or pants are appropriate. Please wear a short sleeve shirt. 6. No perfume, cologne or lotion. 7. Wear comfortable walking shoes. No heels!  Follow-Up: Your physician wants you to follow-up in: 6 months  You will receive a reminder letter in the mail two months in advance.  If you don't receive a letter, please call our office to schedule the follow-up appointment.  If you need a refill on your cardiac medications before your next appointment, please call your pharmacy.  Cardiac Nuclear Scanning A cardiac nuclear scan is used to check your heart for problems, such as the following:  A portion of the heart is not getting enough blood.  Part of the heart muscle has died, which happens with a heart attack.  The heart wall is not working normally.  In this test, a radioactive dye (tracer)  is injected into your bloodstream. After the tracer has traveled to your heart, a scanning device is used to measure how much of the tracer is absorbed by or distributed to various  areas of your heart. LET [facility] CARE PROVIDER KNOW ABOUT:  Any allergies you have.  All medicines you are taking, including vitamins, herbs, eye drops, creams, and over-the-counter medicines.  Previous problems you or members of your family have had with the use of anesthetics.  Any blood disorders you have.  Previous surgeries you have had.  Medical conditions you have.  RISKS AND COMPLICATIONS Generally, this is a safe procedure. However, as with any procedure, problems can occur. Possible problems include:   Serious chest pain.  Rapid heartbeat.  Sensation of warmth in your chest. This usually passes quickly. BEFORE THE PROCEDURE Ask your health care provider about changing or stopping your regular medicines. PROCEDURE This procedure is usually done at a hospital and takes 2-4 hours.  An IV tube is inserted into one of your veins.  Your health care provider will inject a small amount of radioactive tracer through the tube.  You will then wait for 20-40 minutes while the tracer travels through your bloodstream.  You will lie down on an exam table so images of your heart can be taken. Images will be taken for about 15-20 minutes.  You will exercise on a treadmill or stationary bike. While you exercise, your heart activity will be monitored with an electrocardiogram (ECG), and your blood pressure will be checked.  If you are unable to exercise, you may be given a medicine to make your heart beat faster.  When blood flow to your heart has peaked, tracer will again be injected through the IV tube.  After 20-40 minutes, you will get back on the exam table and have more images taken of your heart.  When the procedure is over, your IV tube will be removed. AFTER THE PROCEDURE  You will likely be able to leave shortly after the test. Unless your health care provider tells you otherwise, you may return to your normal schedule, including diet, activities, and  medicines.  Make sure you find out how and when you will get your test results.   This information is not intended to replace advice given to you by your health care provider. Make sure you discuss any questions you have with your health care provider.   Document Released: 11/13/2004 Document Revised: 10/24/2013 Document Reviewed: 09/27/2013 Elsevier Interactive Patient Education [address]     Signed, Lennart Pall [place], [place]  07/13/2016 4:04 PM    [facility] [address] [place], [place]  [address] Phone: [phone]; Fax: [phone]  [address] [place], [place] [address] Phone: [phone]

## 2016-07-13 NOTE — Patient Instructions (Addendum)
Medication Instructions:  Please continue your current medications  Labwork: None  Testing/Procedures: [facility] MYOVIEW  Your caregiver has ordered a Stress Test with nuclear imaging. The purpose of this test is to evaluate the blood supply to your heart muscle. This procedure is referred to as a "Non-Invasive Stress Test." This is because other than having an IV started in your vein, nothing is inserted or "invades" your body. Cardiac stress tests are done to find areas of poor blood flow to the heart by determining the extent of coronary artery disease (CAD). Some patients exercise on a treadmill, which naturally increases the blood flow to your heart, while others who are  unable to walk on a treadmill due to physical limitations have a pharmacologic/chemical stress agent called Lexiscan . This medicine will mimic walking on a treadmill by temporarily increasing your coronary blood flow.   Please note: these test may take anywhere between 2-4 hours to complete  PLEASE REPORT TO [facility] MEDICAL MALL ENTRANCE  THE VOLUNTEERS AT THE FIRST DESK WILL DIRECT YOU WHERE TO GO  Date of Procedure:_____Tuesday, September 26_____  Arrival Time for Procedure:_____7:15 am____________   How to prepare for your Myoview test:  1. Do not eat or drink after midnight 2. No caffeine for 24 hours prior to test 3. No smoking 24 hours prior to test. 4. Your medication may be taken with water.  If your doctor stopped a medication because of this test, do not take that medication. 5. Ladies, please do not wear dresses.  Skirts or pants are appropriate. Please wear a short sleeve shirt. 6. No perfume, cologne or lotion. 7. Wear comfortable walking shoes. No heels!  Follow-Up: Your physician wants you to follow-up in: 6 months  You will receive a reminder letter in the mail two months in advance.  If you don't receive a letter, please call our office to schedule the follow-up appointment.  If you need a refill  on your cardiac medications before your next appointment, please call your pharmacy.  Cardiac Nuclear Scanning A cardiac nuclear scan is used to check your heart for problems, such as the following:  A portion of the heart is not getting enough blood.  Part of the heart muscle has died, which happens with a heart attack.  The heart wall is not working normally.  In this test, a radioactive dye (tracer) is injected into your bloodstream. After the tracer has traveled to your heart, a scanning device is used to measure how much of the tracer is absorbed by or distributed to various areas of your heart. LET [facility] CARE PROVIDER KNOW ABOUT:  Any allergies you have.  All medicines you are taking, including vitamins, herbs, eye drops, creams, and over-the-counter medicines.  Previous problems you or members of your family have had with the use of anesthetics.  Any blood disorders you have.  Previous surgeries you have had.  Medical conditions you have.  RISKS AND COMPLICATIONS Generally, this is a safe procedure. However, as with any procedure, problems can occur. Possible problems include:   Serious chest pain.  Rapid heartbeat.  Sensation of warmth in your chest. This usually passes quickly. BEFORE THE PROCEDURE Ask your health care provider about changing or stopping your regular medicines. PROCEDURE This procedure is usually done at a hospital and takes 2-4 hours.  An IV tube is inserted into one of your veins.  Your health care provider will inject a small amount of radioactive tracer through the tube.  You will  then wait for 20-40 minutes while the tracer travels through your bloodstream.  You will lie down on an exam table so images of your heart can be taken. Images will be taken for about 15-20 minutes.  You will exercise on a treadmill or stationary bike. While you exercise, your heart activity will be monitored with an electrocardiogram (ECG), and your blood  pressure will be checked.  If you are unable to exercise, you may be given a medicine to make your heart beat faster.  When blood flow to your heart has peaked, tracer will again be injected through the IV tube.  After 20-40 minutes, you will get back on the exam table and have more images taken of your heart.  When the procedure is over, your IV tube will be removed. AFTER THE PROCEDURE  You will likely be able to leave shortly after the test. Unless your health care provider tells you otherwise, you may return to your normal schedule, including diet, activities, and medicines.  Make sure you find out how and when you will get your test results.   This information is not intended to replace advice given to you by your health care provider. Make sure you discuss any questions you have with your health care provider.   Document Released: 11/13/2004 Document Revised: 10/24/2013 Document Reviewed: 09/27/2013 Elsevier Interactive Patient Education [employer]2016 Elsevier Inc.

## 2016-07-20 DIAGNOSIS — G4733 Obstructive sleep apnea (adult) (pediatric): Secondary | ICD-10-CM | POA: Diagnosis not present

## 2016-07-20 DIAGNOSIS — M47812 Spondylosis without myelopathy or radiculopathy, cervical region: Secondary | ICD-10-CM | POA: Diagnosis not present

## 2016-07-21 ENCOUNTER — Other Ambulatory Visit: Payer: Self-pay | Admitting: Family Medicine

## 2016-07-21 DIAGNOSIS — M8580 Other specified disorders of bone density and structure, unspecified site: Secondary | ICD-10-CM

## 2016-07-28 ENCOUNTER — Encounter
Admission: RE | Admit: 2016-07-28 | Discharge: 2016-07-28 | Disposition: A | Discharge status: Home / Self Care | Payer: Medicare PPO | Source: Ambulatory Visit | Attending: Student | Admitting: Student

## 2016-07-28 DIAGNOSIS — I10 Essential (primary) hypertension: Secondary | ICD-10-CM | POA: Insufficient documentation

## 2016-07-28 DIAGNOSIS — I2583 Coronary atherosclerosis due to lipid rich plaque: Secondary | ICD-10-CM

## 2016-07-28 DIAGNOSIS — I2510 Atherosclerotic heart disease of native coronary artery without angina pectoris: Secondary | ICD-10-CM | POA: Insufficient documentation

## 2016-07-28 DIAGNOSIS — R0609 Other forms of dyspnea: Secondary | ICD-10-CM | POA: Diagnosis not present

## 2016-07-28 MED ORDER — TECHNETIUM TC 99M TETROFOSMIN IV KIT
31.6700 | PACK | Freq: Once | INTRAVENOUS | Status: AC | PRN
  Administered 2016-07-28: 31.67 via INTRAVENOUS

## 2016-07-28 MED ORDER — TECHNETIUM TC 99M TETROFOSMIN IV KIT
12.0000 | PACK | Freq: Once | INTRAVENOUS | Status: AC | PRN
  Administered 2016-07-28: 12.81 via INTRAVENOUS

## 2016-07-29 LAB — NM MYOCAR MULTI W/SPECT W/WALL MOTION / EF
CHL CUP NUCLEAR SDS: 4
CHL CUP NUCLEAR SSS: 7
CHL CUP STRESS STAGE 1 SPEED: 0 mph
CHL CUP STRESS STAGE 2 GRADE: 0 %
CHL CUP STRESS STAGE 2 HR: 55 {beats}/min
CHL CUP STRESS STAGE 2 SPEED: 0 mph
CHL CUP STRESS STAGE 3 DBP: 56 mmHg
CHL CUP STRESS STAGE 3 GRADE: 10 %
CHL CUP STRESS STAGE 4 GRADE: 12 %
CHL CUP STRESS STAGE 4 SPEED: 2.5 mph
CHL CUP STRESS STAGE 5 GRADE: 14 %
CHL CUP STRESS STAGE 5 HR: 155 {beats}/min
CHL CUP STRESS STAGE 5 SPEED: 3.4 mph
CHL CUP STRESS STAGE 6 SPEED: 3.1 mph
CHL CUP STRESS STAGE 7 GRADE: 0 %
CHL CUP STRESS STAGE 7 HR: 108 {beats}/min
CHL CUP STRESS STAGE 8 DBP: 63 mmHg
CHL CUP STRESS STAGE 8 SBP: 162 mmHg
CHL CUP STRESS STAGE 8 SPEED: 0 mph
CSEPEDS: 0 s
Estimated workload: 7.8 METS
Exercise duration (min): 7 min
LV dias vol: 70 mL (ref 46–106)
LVSYSVOL: 27 mL
Peak HR: 139 {beats}/min
Percent HR: 103 %
Percent of predicted max HR: 92 %
Rest HR: 47 {beats}/min
SRS: 4
Stage 1 Grade: 0 %
Stage 1 HR: 56 {beats}/min
Stage 3 HR: 105 {beats}/min
Stage 3 SBP: 171 mmHg
Stage 3 Speed: 1.7 mph
Stage 4 DBP: 56 mmHg
Stage 4 HR: 125 {beats}/min
Stage 4 SBP: 161 mmHg
Stage 6 Grade: 12.8 %
Stage 6 HR: 139 {beats}/min
Stage 7 Speed: 0 mph
Stage 8 Grade: 0 %
Stage 8 HR: 70 {beats}/min

## 2016-08-05 ENCOUNTER — Encounter: Payer: Self-pay | Admitting: *Deleted

## 2016-08-06 DIAGNOSIS — M5412 Radiculopathy, cervical region: Secondary | ICD-10-CM | POA: Diagnosis not present

## 2016-08-12 ENCOUNTER — Other Ambulatory Visit: Payer: Self-pay | Admitting: Family Medicine

## 2016-08-20 DIAGNOSIS — G4733 Obstructive sleep apnea (adult) (pediatric): Secondary | ICD-10-CM | POA: Diagnosis not present

## 2016-08-20 DIAGNOSIS — Z981 Arthrodesis status: Secondary | ICD-10-CM | POA: Diagnosis not present

## 2016-08-20 DIAGNOSIS — M5412 Radiculopathy, cervical region: Secondary | ICD-10-CM | POA: Diagnosis not present

## 2016-08-20 DIAGNOSIS — M5030 Other cervical disc degeneration, unspecified cervical region: Secondary | ICD-10-CM | POA: Diagnosis not present

## 2016-08-25 DIAGNOSIS — M542 Cervicalgia: Secondary | ICD-10-CM | POA: Diagnosis not present

## 2016-09-01 DIAGNOSIS — M542 Cervicalgia: Secondary | ICD-10-CM | POA: Diagnosis not present

## 2016-09-10 DIAGNOSIS — M542 Cervicalgia: Secondary | ICD-10-CM | POA: Diagnosis not present

## 2016-09-11 ENCOUNTER — Ambulatory Visit: Payer: Medicare PPO

## 2016-09-15 ENCOUNTER — Ambulatory Visit (INDEPENDENT_AMBULATORY_CARE_PROVIDER_SITE_OTHER): Payer: Medicare PPO

## 2016-09-15 DIAGNOSIS — Z23 Encounter for immunization: Secondary | ICD-10-CM | POA: Diagnosis not present

## 2016-09-17 DIAGNOSIS — M5412 Radiculopathy, cervical region: Secondary | ICD-10-CM | POA: Diagnosis not present

## 2016-09-17 DIAGNOSIS — M5030 Other cervical disc degeneration, unspecified cervical region: Secondary | ICD-10-CM | POA: Diagnosis not present

## 2016-09-28 ENCOUNTER — Encounter: Payer: Self-pay | Admitting: General Surgery

## 2016-09-28 DIAGNOSIS — Z1231 Encounter for screening mammogram for malignant neoplasm of breast: Secondary | ICD-10-CM | POA: Diagnosis not present

## 2016-10-05 ENCOUNTER — Telehealth: Payer: Self-pay | Admitting: *Deleted

## 2016-10-05 DIAGNOSIS — R922 Inconclusive mammogram: Secondary | ICD-10-CM | POA: Diagnosis not present

## 2016-10-05 DIAGNOSIS — R928 Other abnormal and inconclusive findings on diagnostic imaging of breast: Secondary | ICD-10-CM | POA: Diagnosis not present

## 2016-10-05 NOTE — Telephone Encounter (Signed)
Called patient and left message, pt had mammogram at [facility] and needs additional views before coming to see us. Her appointment was canceled for 10/06/16 and needs to be rescheduled once she receives her additional imaging.

## 2016-10-06 ENCOUNTER — Ambulatory Visit: Payer: Medicare PPO | Admitting: General Surgery

## 2016-10-06 ENCOUNTER — Encounter: Payer: Self-pay | Admitting: *Deleted

## 2016-10-06 ENCOUNTER — Encounter: Payer: Self-pay | Admitting: General Surgery

## 2016-10-12 ENCOUNTER — Ambulatory Visit (INDEPENDENT_AMBULATORY_CARE_PROVIDER_SITE_OTHER): Payer: Medicare PPO | Admitting: General Surgery

## 2016-10-12 VITALS — BP 144/70 | HR 84 | Resp 13 | Ht 65.0 in | Wt 182.0 lb

## 2016-10-12 DIAGNOSIS — Z9889 Other specified postprocedural states: Secondary | ICD-10-CM

## 2016-10-12 DIAGNOSIS — N6019 Diffuse cystic mastopathy of unspecified breast: Secondary | ICD-10-CM

## 2016-10-12 NOTE — Progress Notes (Signed)
Patient ID: Jodi Kirby, female   DOB: May 10, 1946, 70 y.o.   MRN: [id number]  Chief Complaint  Patient presents with  . Follow-up    mammogram    HPI Jodi Kirby is a 70 y.o. female who presents for a breast evaluation. The most recent mammogram was done on 09/28/2016 and added views on 10/06/2016. Patient does perform regular self breast checks and gets regular mammograms done.   I have reviewed the history of present illness with the patient.  HPI  Past Medical History:  Diagnosis Date  . Allergic rhinitis, cause unspecified   . Coronary artery disease   . Dysmetabolic syndrome X   . Dysuria   . Esophageal reflux   . Essential hypertension, benign   . Hyperlipidemia   . Lumbago   . Lump or mass in breast   . Myalgia and myositis, unspecified   . Obstructive sleep apnea   . Osteoarthrosis, unspecified whether generalized or localized, lower leg   . Other abnormal glucose   . Other and unspecified angina pectoris (HCC)   . 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 Toxic effect of venom(989.5)   . Unspecified iridocyclitis   . Unspecified vitamin D deficiency     Past Surgical History:  Procedure Laterality Date  . ABDOMINAL HYSTERECTOMY  1970  . ANKLE SURGERY Right 1980  . BLADDER SURGERY  2012  . CARDIAC CATHETERIZATION  2003   [facility]  . CARDIAC CATHETERIZATION     [facility]  . COLONOSCOPY  2008, 2015   Dr. Servando SnareWohl  . CYSTOSTOMY    . KNEE SURGERY  08/18/2011   arthroscopic right  . LUMBAR FUSION     L4-5, S-1  . NECK SURGERY  2003  . TOTAL KNEE ARTHROPLASTY Right 04/29/2015   Procedure: TOTAL KNEE ARTHROPLASTY;  Surgeon: Erin SonsHarold Kernodle, MD;  Location: [facility] ORS;  Service: Orthopedics;  Laterality: Right;  . TUBAL LIGATION      Family History  Problem Relation Age of Onset  . CAD Father     Social History Social History  Substance Use Topics  . Smoking status: Never Smoker  . Smokeless  tobacco: Never Used  . Alcohol use No    Allergies  Allergen Reactions  . Niaspan [Niacin Er] Other (See Comments)    Patient states she can't remember the reaction because it so long ago.  . Oxycodone-Acetaminophen Nausea Only  . Statins Other (See Comments)    Muscle and joint pain.    Current Outpatient Prescriptions  Medication Sig Dispense Refill  . alendronate (FOSAMAX) 70 MG tablet TAKE 1 TABLET WEEKLY 12 tablet 1  . ALPRAZolam (XANAX) 0.5 MG tablet Take 1 tablet (0.5 mg total) by mouth at bedtime as needed for anxiety. 30 tablet 0  . Amlodipine-Valsartan-HCTZ 5-160-25 MG TABS Take 1 tablet by mouth daily. (Patient taking differently: Take 1 tablet by mouth every morning. ) 90 tablet 1  . aspirin EC 81 MG tablet Take 81 mg by mouth every morning.    . Cholecalciferol (VITAMIN D3) 1000 UNITS CAPS Take 1,000 Units by mouth every morning.     Marland Kitchen. EPINEPHrine (EPIPEN 2-PAK) 0.3 mg/0.3 mL DEVI Inject 0.3 mg into the muscle as needed.     . fluticasone (FLONASE) 50 MCG/ACT nasal spray USE TWO SPRAY(S) IN EACH NOSTRIL AT BEDTIME (Patient taking differently: USE TWO SPRAY(S) IN EACH NOSTRIL AT BEDTIME AS NEEDED FOR RHINITIS.) 48 g 0  . loratadine (CLARITIN) 10  MG tablet Take 1 tablet (10 mg total) by mouth daily. (Patient taking differently: Take 10 mg by mouth daily as needed for allergies. ) 30 tablet 5  . Multiple Vitamins-Minerals (CENTRUM SILVER PO) Take 1 tablet by mouth every morning.     . nitroGLYCERIN (NITROSTAT) 0.4 MG SL tablet Place 1 tablet (0.4 mg total) under the tongue every 5 (five) minutes as needed for chest pain. 25 tablet 0  . omeprazole (PRILOSEC) 40 MG capsule Take 1 capsule (40 mg total) by mouth daily. 90 capsule 0   No current facility-administered medications for this visit.     Review of Systems Review of Systems  Constitutional: Negative.   Respiratory: Negative.   Cardiovascular: Negative.     Blood pressure (!) 144/70, pulse 84, resp. rate 13, height  5\' 5"  (1.651 m), weight 182 lb (82.6 kg).  Physical Exam Physical Exam  Constitutional: She is oriented to person, place, and time. She appears well-developed and well-nourished.  Eyes: Conjunctivae are normal. No scleral icterus.  Neck: Neck supple.  Cardiovascular: Normal rate, regular rhythm and normal heart sounds.   Pulmonary/Chest: Effort normal and breath sounds normal. Right breast exhibits no inverted nipple, no mass, no nipple discharge and no skin change. Left breast exhibits no inverted nipple, no mass, no nipple discharge and no skin change. Breasts are symmetrical.  Abdominal: Soft. Bowel sounds are normal. There is no tenderness.  Lymphadenopathy:    She has no cervical adenopathy.    She has no axillary adenopathy.  Neurological: She is alert and oriented to person, place, and time.  Skin: Skin is dry.    Data Reviewed Mammogram reviewed- cat 2   Assessment    Stable exam. History of prior breast mass, FCD    Plan    The patient has been asked to return to the office in one year with a bilateral screening mammogram.     This information has been scribed by Ples SpecterJessica Qualls CMA.  SANKAR,SEEPLAPUTHUR G 10/12/2016, 3:17 PM

## 2016-10-12 NOTE — Patient Instructions (Signed)
The patient has been asked to return to the office in one year with a bilateral screening mammogram. 

## 2016-11-02 DIAGNOSIS — R42 Dizziness and giddiness: Secondary | ICD-10-CM

## 2016-11-02 HISTORY — DX: Dizziness and giddiness: R42

## 2016-11-09 ENCOUNTER — Other Ambulatory Visit: Payer: Self-pay | Admitting: Family Medicine

## 2016-11-09 NOTE — Telephone Encounter (Signed)
Patient requesting refill of Ventolin to [facility].

## 2016-11-16 ENCOUNTER — Encounter: Payer: Self-pay | Admitting: Family Medicine

## 2016-11-16 ENCOUNTER — Ambulatory Visit (INDEPENDENT_AMBULATORY_CARE_PROVIDER_SITE_OTHER): Payer: Medicare PPO | Admitting: Family Medicine

## 2016-11-16 VITALS — BP 138/70 | HR 96 | Temp 98.7°F | Resp 16 | Ht 62.5 in | Wt 182.2 lb

## 2016-11-16 DIAGNOSIS — E782 Mixed hyperlipidemia: Secondary | ICD-10-CM | POA: Diagnosis not present

## 2016-11-16 DIAGNOSIS — R300 Dysuria: Secondary | ICD-10-CM | POA: Diagnosis not present

## 2016-11-16 DIAGNOSIS — E559 Vitamin D deficiency, unspecified: Secondary | ICD-10-CM | POA: Diagnosis not present

## 2016-11-16 DIAGNOSIS — D649 Anemia, unspecified: Secondary | ICD-10-CM

## 2016-11-16 DIAGNOSIS — I2583 Coronary atherosclerosis due to lipid rich plaque: Secondary | ICD-10-CM

## 2016-11-16 DIAGNOSIS — K219 Gastro-esophageal reflux disease without esophagitis: Secondary | ICD-10-CM | POA: Diagnosis not present

## 2016-11-16 DIAGNOSIS — M8580 Other specified disorders of bone density and structure, unspecified site: Secondary | ICD-10-CM

## 2016-11-16 DIAGNOSIS — I2510 Atherosclerotic heart disease of native coronary artery without angina pectoris: Secondary | ICD-10-CM | POA: Diagnosis not present

## 2016-11-16 DIAGNOSIS — Z9989 Dependence on other enabling machines and devices: Secondary | ICD-10-CM

## 2016-11-16 DIAGNOSIS — G4709 Other insomnia: Secondary | ICD-10-CM

## 2016-11-16 DIAGNOSIS — I10 Essential (primary) hypertension: Secondary | ICD-10-CM

## 2016-11-16 DIAGNOSIS — G4733 Obstructive sleep apnea (adult) (pediatric): Secondary | ICD-10-CM

## 2016-11-16 DIAGNOSIS — R350 Frequency of micturition: Secondary | ICD-10-CM | POA: Diagnosis not present

## 2016-11-16 DIAGNOSIS — I209 Angina pectoris, unspecified: Secondary | ICD-10-CM | POA: Diagnosis not present

## 2016-11-16 LAB — COMPLETE METABOLIC PANEL WITH GFR
ALBUMIN: 4.2 g/dL (ref 3.6–5.1)
ALK PHOS: 73 U/L (ref 33–130)
ALT: 18 U/L (ref 6–29)
AST: 17 U/L (ref 10–35)
BILIRUBIN TOTAL: 0.4 mg/dL (ref 0.2–1.2)
BUN: 15 mg/dL (ref 7–25)
CALCIUM: 9.6 mg/dL (ref 8.6–10.4)
CO2: 27 mmol/L (ref 20–31)
CREATININE: 1.03 mg/dL — AB (ref 0.60–0.93)
Chloride: 105 mmol/L (ref 98–110)
GFR, Est African American: 63 mL/min (ref >=60)
GFR, Est Non African American: 55 mL/min — ABNORMAL LOW (ref >=60)
GLUCOSE: 104 mg/dL — AB (ref 65–99)
POTASSIUM: 3.9 mmol/L (ref 3.5–5.3)
SODIUM: 142 mmol/L (ref 135–146)
TOTAL PROTEIN: 6.6 g/dL (ref 6.1–8.1)

## 2016-11-16 LAB — POCT URINALYSIS DIPSTICK
Bilirubin, UA: NEGATIVE
Blood, UA: NEGATIVE
GLUCOSE UA: NEGATIVE
KETONES UA: NEGATIVE
LEUKOCYTES UA: NEGATIVE
Nitrite, UA: NEGATIVE
Protein, UA: NEGATIVE
SPEC GRAV UA: 1.01
Urobilinogen, UA: 0.2
pH, UA: 6.5

## 2016-11-16 LAB — CBC WITH DIFFERENTIAL/PLATELET
BASOS ABS: 0 {cells}/uL (ref 0–200)
Basophils Relative: 0 %
Eosinophils Absolute: 58 cells/uL (ref 15–500)
Eosinophils Relative: 1 %
HEMATOCRIT: 35.2 % (ref 35.0–45.0)
HEMOGLOBIN: 11.6 g/dL — AB (ref 11.7–15.5)
LYMPHS ABS: 1682 {cells}/uL (ref 850–3900)
Lymphocytes Relative: 29 %
MCH: 28 pg (ref 27.0–33.0)
MCHC: 33 g/dL (ref 32.0–36.0)
MCV: 84.8 fL (ref 80.0–100.0)
MONO ABS: 522 {cells}/uL (ref 200–950)
MPV: 10.1 fL (ref 7.5–12.5)
Monocytes Relative: 9 %
NEUTROS ABS: 3538 {cells}/uL (ref 1500–7800)
NEUTROS PCT: 61 %
Platelets: 264 10*3/uL (ref 140–400)
RBC: 4.15 MIL/uL (ref 3.80–5.10)
RDW: 13.6 % (ref 11.0–15.0)
WBC: 5.8 10*3/uL (ref 3.8–10.8)

## 2016-11-16 MED ORDER — ALENDRONATE SODIUM 70 MG PO TABS: ORAL_TABLET | ORAL | 3 refills | Status: DC

## 2016-11-16 MED ORDER — CIPROFLOXACIN HCL 250 MG PO TABS: 250.0000 mg | ORAL_TABLET | Freq: Two times a day (BID) | ORAL | 0 refills | Status: DC

## 2016-11-16 MED ORDER — OMEPRAZOLE 40 MG PO CPDR: 40.0000 mg | DELAYED_RELEASE_CAPSULE | Freq: Every day | ORAL | 3 refills | Status: DC

## 2016-11-16 MED ORDER — ALPRAZOLAM 0.5 MG PO TABS: 0.5000 mg | ORAL_TABLET | Freq: Every evening | ORAL | 0 refills | Status: DC | PRN

## 2016-11-16 NOTE — Progress Notes (Signed)
Name: Jodi Kirby   MRN: [id number]    DOB: 01/03/1946   Date:11/16/2016       Progress Note  Subjective  Chief Complaint  Chief Complaint  Patient presents with  . Follow-up    patient is here for her 6- month f/u  . Hypertension    patient does not report of any neg sx  . Hyperlipidemia    patient stated that she has muscle cramps. patient has tried a home remedy  . Insomnia    patient stated that she has not been sleeping as well  . Dyspepsia  . Shoulder Pain    patient stated that she has had therapy and they found that it was a combination of shoulder/neck pain. it is gradually improving  . Urinary Tract Infection    patient stated that she has had frequency, burning, and pain upon urination for about a week    HPI  OSA: using CPAP every night, she keeps it on for about 7 hours per night. Not sleeping well for the past couple of weeks because her sister died from complications of a stroke.    Insomnia: she has a long history of interrupted sleep, occasionally taking Alprazolam for sleep, last rx written one year ago ( 11/2015)  Knee replacement surgery right: surgery was June 27 th, 2016, she has completed her PT and flexion up o 128  extension 180 degrees ( she states it is even better now - flexing more ), she is still doing her exercises at home and is back at the gym three times weekly. Taking Meloxicam prn only ( mostly for her neck)  Dysuria: she states that for the past week she has noticed, low abdominal pain, urgency, dysuria and occasionally incontinence over the past week. No fever, chills.    HTN: taking half pill of Exforge HCTZ ( discussed changing to dual therapy ) but she prefers to stay like it is. Denies side effects of medication, bp is at goal. She denies any recent episodes of chest pain, SOB or palpitation   Hyperlipidemia: unable to tolerate statins, even in a low dose of 2.5 mg of Crestor, muscles aches were severe and she stopped  medication. Dr. Mariah Milling advised Zetia and we gave her a prescription on her last visit, however it was too expensive and patient stopped taking it. Discussed injectables, but she also refuses   GERD: under control with Omeprazole in am's, occasionally takes Ranitidine qpm. No heartburn, no regurgitation.   Bradycardia: present for years, no palpitation, syncope, chest pain  Angina Pectoris: she has CAD, mild disease, seeing Dr. Mariah Milling, went to [facility] 06/2016  - last episode of chest pain, doing well now. Taking aspirin and ARB, not on beta-blocker because of bradycardia and not on statin because of intolerance.  Osteopenia: diagnosed in 2016 - and was started on Fosamax, denies side effects. She states she used to take something for her bones many years ago. Frax was 1.6% for hip fracture but not reliable since she took medications in the past, recheck bone density in 2018 - during her CPE   Patient Active Problem List   Diagnosis Date Noted  . Chest pain 06/27/2016  . Osteopenia 05/15/2016  . Insomnia 11/08/2015  . Angina pectoris (HCC) 07/10/2015  . Seasonal allergic rhinitis 07/09/2015  . GERD (gastroesophageal reflux disease) 07/09/2015  . Bee sting allergy 07/09/2015  . History of knee replacement procedure of right knee 04/29/2015  . History of colonic polyps 09/24/2014  .  Fibrocystic breast 09/24/2014  . Arthritis of knee, degenerative 06/12/2014  . Hyperlipidemia 02/09/2013  . Coronary artery disease 02/09/2013  . Obstructive sleep apnea on CPAP 02/09/2013  . Hypertension 02/09/2013    Past Surgical History:  Procedure Laterality Date  . ABDOMINAL HYSTERECTOMY  1970  . ANKLE SURGERY Right 1980  . BLADDER SURGERY  2012  . CARDIAC CATHETERIZATION  2003   [facility]  . CARDIAC CATHETERIZATION     [facility]  . COLONOSCOPY  2008, 2015   Dr. Servando SnareWohl  . CYSTOSTOMY    . KNEE SURGERY  08/18/2011   arthroscopic right  . LUMBAR FUSION     L4-5, S-1  . NECK SURGERY  2003  .  TOTAL KNEE ARTHROPLASTY Right 04/29/2015   Procedure: TOTAL KNEE ARTHROPLASTY;  Surgeon: Erin SonsHarold Kernodle, MD;  Location: [facility] ORS;  Service: Orthopedics;  Laterality: Right;  . TUBAL LIGATION      Family History  Problem Relation Age of Onset  . CAD Father     Social History   Social History  . Marital status: Married    Spouse name: N/A  . Number of children: N/A  . Years of education: N/A   Occupational History  . Not on file.   Social History Main Topics  . Smoking status: Never Smoker  . Smokeless tobacco: Never Used  . Alcohol use No  . Drug use: No  . Sexual activity: Yes    Partners: Male    Birth control/ protection: None   Other Topics Concern  . Not on file   Social History Narrative  . No narrative on file     Current Outpatient Prescriptions:  .  alendronate (FOSAMAX) 70 MG tablet, TAKE 1 TABLET WEEKLY, Disp: 12 tablet, Rfl: 1 .  ALPRAZolam (XANAX) 0.5 MG tablet, Take 1 tablet (0.5 mg total) by mouth at bedtime as needed for anxiety., Disp: 30 tablet, Rfl: 0 .  Amlodipine-Valsartan-HCTZ 5-160-25 MG TABS, Take 1 tablet by mouth daily. (Patient taking differently: Take 1 tablet by mouth every morning. ), Disp: 90 tablet, Rfl: 1 .  aspirin EC 81 MG tablet, Take 81 mg by mouth every morning., Disp: , Rfl:  .  Cholecalciferol (VITAMIN D3) 1000 UNITS CAPS, Take 1,000 Units by mouth every morning. , Disp: , Rfl:  .  fluticasone (FLONASE) 50 MCG/ACT nasal spray, USE TWO SPRAY(S) IN EACH NOSTRIL AT BEDTIME (Patient taking differently: USE TWO SPRAY(S) IN EACH NOSTRIL AT BEDTIME AS NEEDED FOR RHINITIS.), Disp: 48 g, Rfl: 0 .  loratadine (CLARITIN) 10 MG tablet, Take 1 tablet (10 mg total) by mouth daily. (Patient taking differently: Take 10 mg by mouth daily as needed for allergies. ), Disp: 30 tablet, Rfl: 5 .  meloxicam (MOBIC) 7.5 MG tablet, TAKE 1 TABLET BY MOUTH ONCE DAILY, Disp: , Rfl:  .  Multiple Vitamins-Minerals (CENTRUM SILVER PO), Take 1 tablet by mouth  every morning. , Disp: , Rfl:  .  omeprazole (PRILOSEC) 40 MG capsule, Take 1 capsule (40 mg total) by mouth daily., Disp: 90 capsule, Rfl: 0 .  EPINEPHrine (EPIPEN 2-PAK) 0.3 mg/0.3 mL DEVI, Inject 0.3 mg into the muscle as needed. , Disp: , Rfl:  .  nitroGLYCERIN (NITROSTAT) 0.4 MG SL tablet, Place 1 tablet (0.4 mg total) under the tongue every 5 (five) minutes as needed for chest pain. (Patient not taking: Reported on 11/16/2016), Disp: 25 tablet, Rfl: 0 .  VENTOLIN HFA 108 (90 Base) MCG/ACT inhaler, INHALE TWO PUFFS INTO LUNGS 4 TIMES DAILY AS  NEEDED FOR WHEEZING (Patient not taking: Reported on 11/16/2016), Disp: 18 each, Rfl: 0  Allergies  Allergen Reactions  . Niaspan [Niacin Er] Other (See Comments)    Patient states she can't remember the reaction because it so long ago.  . Oxycodone-Acetaminophen Nausea Only  . Statins Other (See Comments)    Muscle and joint pain.     ROS  Constitutional: Negative for fever or weight change.  Respiratory: Negative for cough and shortness of breath.   Cardiovascular: Negative for chest pain or palpitations.  Gastrointestinal: Positive  for supra pubic abdominal pain, no bowel changes.  Musculoskeletal: Negative for gait problem or joint swelling.  Skin: Negative for rash.  Neurological: Negative for dizziness or headache.  No other specific complaints in a complete review of systems (except as listed in HPI above).  Objective  Vitals:   11/16/16 0751  BP: 138/70  Pulse: 96  Resp: 16  Temp: 98.7 F (37.1 C)  TempSrc: Oral  SpO2: 96%  Weight: 182 lb 3.2 oz (82.6 kg)  Height: 5' 2.5" (1.588 m)    Body mass index is 32.79 kg/m.  Physical Exam  Constitutional: Patient appears well-developed and well-nourished. Obese  No distress.  HEENT: head atraumatic, normocephalic, pupils equal and reactive to light,  neck supple, throat within normal limits Cardiovascular: Normal rate, regular rhythm and normal heart sounds.  No murmur heard.  No BLE edema. Pulmonary/Chest: Effort normal and breath sounds normal. No respiratory distress. Abdominal: Soft.  There is no tenderness. Psychiatric: Patient has a normal mood and affect. behavior is normal. Judgment and thought content normal.  PHQ2/9: Depression screen [facility] 2/9 05/15/2016 01/14/2016 12/09/2015 11/08/2015 07/09/2015  Decreased Interest 0 0 0 0 0  Down, Depressed, Hopeless 0 0 0 0 0  PHQ - 2 Score 0 0 0 0 0     Fall Risk: Fall Risk  05/15/2016 01/14/2016 12/09/2015 11/08/2015 07/09/2015  Falls in the past year? No No No No No      Assessment & Plan  1. Frequency of urination  History of bladder polyps, we will try antibiotics, check urine culture and if negative refer back to Urlogist.  - POCT urinalysis dipstick  2. Essential hypertension  - COMPLETE METABOLIC PANEL WITH GFR  3. Angina pectoris (HCC)  Doing well , she takes NTG prn  4. Coronary artery disease due to lipid rich plaque  She can't tolerate statin therapy, discussed injectables, but she refuses medication. Last labs within normal limits Continue aspirin 81 mg and bp control   5. Mixed hyperlipidemia  Not on statin therapy because she can't tolerate it  6. Gastroesophageal reflux disease without esophagitis  - omeprazole (PRILOSEC) 40 MG capsule; Take 1 capsule (40 mg total) by mouth daily.  Dispense: 90 capsule; Refill: 3  7. Anemia, unspecified type  During visit to [facility], we will recheck it, colonoscopy is up to date - CBC with Differential/Platelet - Iron, TIBC and Ferritin Panel  8. Vitamin D deficiency  - VITAMIN D 25 Hydroxy (Vit-D Deficiency, Fractures)  9. Dysuria  - Urine Culture - ciprofloxacin (CIPRO) 250 MG tablet; Take 1 tablet (250 mg total) by mouth 2 (two) times daily.  Dispense: 10 tablet; Refill: 0  10. Other insomnia  - ALPRAZolam (XANAX) 0.5 MG tablet; Take 1 tablet (0.5 mg total) by mouth at bedtime as needed for anxiety.  Dispense: 30 tablet; Refill: 0  11.  Osteopenia, unspecified location  Continue Fosamax  12. Obstructive sleep apnea on CPAP  She  wears CPAP every night

## 2016-11-17 ENCOUNTER — Encounter: Payer: Self-pay | Admitting: Family Medicine

## 2016-11-17 DIAGNOSIS — R739 Hyperglycemia, unspecified: Secondary | ICD-10-CM | POA: Insufficient documentation

## 2016-11-17 LAB — IRON,TIBC AND FERRITIN PANEL
%SAT: 34 % (ref 11–50)
Ferritin: 150 ng/mL (ref 20–288)
Iron: 93 ug/dL (ref 45–160)
TIBC: 277 ug/dL (ref 250–450)

## 2016-11-17 LAB — URINE CULTURE

## 2016-11-17 LAB — VITAMIN D 25 HYDROXY (VIT D DEFICIENCY, FRACTURES): VIT D 25 HYDROXY: 40 ng/mL (ref 30–100)

## 2016-12-13 ENCOUNTER — Encounter: Payer: Self-pay | Admitting: Gynecology

## 2016-12-13 ENCOUNTER — Ambulatory Visit
Admission: EM | Admit: 2016-12-13 | Discharge: 2016-12-13 | Disposition: A | Discharge status: Home / Self Care | Payer: Medicare PPO | Attending: Family Medicine | Admitting: Family Medicine

## 2016-12-13 DIAGNOSIS — R69 Illness, unspecified: Secondary | ICD-10-CM | POA: Diagnosis not present

## 2016-12-13 DIAGNOSIS — R6889 Other general symptoms and signs: Secondary | ICD-10-CM | POA: Diagnosis not present

## 2016-12-13 DIAGNOSIS — J111 Influenza due to unidentified influenza virus with other respiratory manifestations: Secondary | ICD-10-CM

## 2016-12-13 MED ORDER — HYDROCOD POLST-CPM POLST ER 10-8 MG/5ML PO SUER: 2.5000 mL | Freq: Two times a day (BID) | ORAL | 0 refills | Status: DC | PRN

## 2016-12-13 MED ORDER — FEXOFENADINE-PSEUDOEPHED ER 180-240 MG PO TB24: 1.0000 | ORAL_TABLET | Freq: Every day | ORAL | 0 refills | Status: DC

## 2016-12-13 MED ORDER — OSELTAMIVIR PHOSPHATE 75 MG PO CAPS: 75.0000 mg | ORAL_CAPSULE | Freq: Two times a day (BID) | ORAL | 0 refills | Status: DC

## 2016-12-13 NOTE — ED Triage Notes (Signed)
Patient c/o fever of 103 x today. Per patient also having headache and a cough.

## 2016-12-13 NOTE — ED Provider Notes (Addendum)
[facility]    CSN: [id number] Arrival date & time: 12/13/16  1301     History   Chief Complaint Chief Complaint  Patient presents with  . Fever  . Headache  . Cough    HPI Jodi Kirby is a 71 y.o. female.   Patient reports having nasal congestion cough aching all over sore throat and fever started this morning. She states that the ache achiness the cough and sore throat she was concerned about. However when her fever went  To 103 she felt at that time that she needed to be seen and came in to be evaluated. She did get her flu shot this year. She has a history of CHF and hypertension. She is allergic to Niaspan statins and oxycodone makes her nauseous however she also states she's been coughing her head off and unable to rest and sleep since this cough started this morning. Family medical history hypertension and some heart disease. She never smoked.   The history is provided by the patient. No language interpreter was used.  Fever  Temp source:  Oral Severity:  Moderate Onset quality:  Sudden Timing:  Constant Progression:  Worsening Chronicity:  New Relieved by:  Nothing Worsened by:  Nothing Associated symptoms: chills, congestion, cough, headaches, myalgias, rash, rhinorrhea and sore throat   Associated symptoms: no diarrhea and no dysuria   Headache  Associated symptoms: congestion, cough, fever, myalgias and sore throat   Associated symptoms: no diarrhea   Cough  Associated symptoms: chills, fever, headaches, myalgias, rash, rhinorrhea and sore throat     Past Medical History:  Diagnosis Date  . Allergic rhinitis, cause unspecified   . Coronary artery disease   . Dysmetabolic syndrome X   . Dysuria   . Esophageal reflux   . Essential hypertension, benign   . Hyperlipidemia   . Lumbago   . Lump or mass in breast   . Myalgia and myositis, unspecified   . Obstructive sleep apnea   . Osteoarthrosis, unspecified whether generalized or  localized, lower leg   . Other abnormal glucose   . Other and unspecified angina pectoris   . 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 Toxic effect of venom(989.5)   . Unspecified iridocyclitis   . Unspecified vitamin D deficiency     Patient Active Problem List   Diagnosis Date Noted  . Hyperglycemia 11/17/2016  . Osteopenia 05/15/2016  . Insomnia 11/08/2015  . Angina pectoris (HCC) 07/10/2015  . Seasonal allergic rhinitis 07/09/2015  . GERD (gastroesophageal reflux disease) 07/09/2015  . Bee sting allergy 07/09/2015  . History of knee replacement procedure of right knee 04/29/2015  . History of colonic polyps 09/24/2014  . Fibrocystic breast 09/24/2014  . Arthritis of knee, degenerative 06/12/2014  . Hyperlipidemia 02/09/2013  . Coronary artery disease 02/09/2013  . Obstructive sleep apnea on CPAP 02/09/2013  . Hypertension 02/09/2013    Past Surgical History:  Procedure Laterality Date  . ABDOMINAL HYSTERECTOMY  1970  . ANKLE SURGERY Right 1980  . BLADDER SURGERY  2012  . CARDIAC CATHETERIZATION  2003   [place]  . CARDIAC CATHETERIZATION     [place]  . COLONOSCOPY  2008, 2015   Dr. Servando Snare  . CYSTOSTOMY    . KNEE SURGERY  08/18/2011   arthroscopic right  . LUMBAR FUSION     L4-5, S-1  . NECK SURGERY  2003  . TOTAL KNEE ARTHROPLASTY Right 04/29/2015   Procedure:  TOTAL KNEE ARTHROPLASTY;  Surgeon: Erin SonsHarold Kernodle, MD;  Location: [facility] ORS;  Service: Orthopedics;  Laterality: Right;  . TUBAL LIGATION      OB History    Gravida Para Term Preterm AB Living   4         4   SAB TAB Ectopic Multiple Live Births                  Obstetric Comments   Age with first menstruation-13 Age with first pregnancy16 LMP-1970, hysterectomy       Home Medications    Prior to Admission medications   Medication Sig Start Date End Date Taking? Authorizing Provider  alendronate (FOSAMAX) 70 MG tablet TAKE 1 TABLET WEEKLY  11/16/16   Alba CoryKrichna Sowles, MD  ALPRAZolam Prudy Feeler(XANAX) 0.5 MG tablet Take 1 tablet (0.5 mg total) by mouth at bedtime as needed for anxiety. 11/16/16   Alba CoryKrichna Sowles, MD  Amlodipine-Valsartan-HCTZ [id number] MG TABS Take 1 tablet by mouth daily. Patient taking differently: Take 1 tablet by mouth every morning.  05/15/16   Alba CoryKrichna Sowles, MD  aspirin EC 81 MG tablet Take 81 mg by mouth every morning.    Historical Provider, MD  chlorpheniramine-HYDROcodone (TUSSIONEX PENNKINETIC ER) 10-8 MG/5ML SUER Take 2.5-5 mLs by mouth every 12 (twelve) hours as needed for cough. If nausea occurs may need to stop this medication 12/13/16   Hassan RowanEugene Briceyda Abdullah, MD  Cholecalciferol (VITAMIN D3) 1000 UNITS CAPS Take 1,000 Units by mouth every morning.     Historical Provider, MD  ciprofloxacin (CIPRO) 250 MG tablet Take 1 tablet (250 mg total) by mouth 2 (two) times daily. 11/16/16   Alba CoryKrichna Sowles, MD  EPINEPHrine (EPIPEN 2-PAK) 0.3 mg/0.3 mL DEVI Inject 0.3 mg into the muscle as needed.     Historical Provider, MD  fexofenadine-pseudoephedrine (ALLEGRA-D ALLERGY & CONGESTION) 180-240 MG 24 hr tablet Take 1 tablet by mouth daily. 12/13/16   Hassan RowanEugene Eman Rynders, MD  fluticasone (FLONASE) 50 MCG/ACT nasal spray USE TWO SPRAY(S) IN EACH NOSTRIL AT BEDTIME Patient taking differently: USE TWO SPRAY(S) IN EACH NOSTRIL AT BEDTIME AS NEEDED FOR RHINITIS. 10/10/15   Alba CoryKrichna Sowles, MD  loratadine (CLARITIN) 10 MG tablet Take 1 tablet (10 mg total) by mouth daily. Patient taking differently: Take 10 mg by mouth daily as needed for allergies.  07/09/15   Alba CoryKrichna Sowles, MD  meloxicam (MOBIC) 7.5 MG tablet TAKE 1 TABLET BY MOUTH ONCE DAILY 10/14/16   Historical Provider, MD  Multiple Vitamins-Minerals (CENTRUM SILVER PO) Take 1 tablet by mouth every morning.     Historical Provider, MD  nitroGLYCERIN (NITROSTAT) 0.4 MG SL tablet Place 1 tablet (0.4 mg total) under the tongue every 5 (five) minutes as needed for chest pain. Patient not taking: Reported on  11/16/2016 07/08/16   Antonieta Ibaimothy J Gollan, MD  omeprazole (PRILOSEC) 40 MG capsule Take 1 capsule (40 mg total) by mouth daily. 11/16/16   Alba CoryKrichna Sowles, MD  oseltamivir (TAMIFLU) 75 MG capsule Take 1 capsule (75 mg total) by mouth 2 (two) times daily. 12/13/16   Hassan RowanEugene Kairon Shock, MD  VENTOLIN HFA 108 (90 Base) MCG/ACT inhaler INHALE TWO PUFFS INTO LUNGS 4 TIMES DAILY AS NEEDED FOR WHEEZING Patient not taking: Reported on 11/16/2016 11/09/16   Alba CoryKrichna Sowles, MD    Family History Family History  Problem Relation Age of Onset  . CAD Father     Social History Social History  Substance Use Topics  . Smoking status: Never Smoker  . Smokeless tobacco: Never Used  .  Alcohol use No     Allergies   Niaspan [niacin er]; Oxycodone-acetaminophen; and Statins   Review of Systems Review of Systems  Constitutional: Positive for chills and fever.  HENT: Positive for congestion, rhinorrhea and sore throat.   Respiratory: Positive for cough.   Gastrointestinal: Negative for diarrhea.  Genitourinary: Negative for dysuria.  Musculoskeletal: Positive for myalgias.  Skin: Positive for rash.  Neurological: Positive for headaches.  All other systems reviewed and are negative.    Physical Exam Triage Vital Signs ED Triage Vitals  Enc Vitals Group     BP 12/13/16 1417 (!) 156/77     Pulse Rate 12/13/16 1417 86     Resp 12/13/16 1417 16     Temp 12/13/16 1417 (!) 102.4 F (39.1 C)     Temp Source 12/13/16 1417 Oral     SpO2 12/13/16 1417 99 %     Weight 12/13/16 1420 175 lb (79.4 kg)     Height 12/13/16 1420 5\' 2"  (1.575 m)     Head Circumference --      Peak Flow --      Pain Score 12/13/16 1421 8     Pain Loc --      Pain Edu? --      Excl. in GC? --    No data found.   Updated Vital Signs BP (!) 156/77 (BP Location: Left Arm)   Pulse 86   Temp (!) 102.4 F (39.1 C) (Oral)   Resp 16   Ht 5\' 2"  (1.575 m)   Wt 175 lb (79.4 kg)   SpO2 99%   BMI 32.01 kg/m   Visual Acuity Right Eye  Distance:   Left Eye Distance:   Bilateral Distance:    Right Eye Near:   Left Eye Near:    Bilateral Near:     Physical Exam  Constitutional: She appears well-developed and well-nourished.  HENT:  Head: Normocephalic and atraumatic.  Right Ear: Hearing, tympanic membrane, external ear and ear canal normal.  Left Ear: Hearing, tympanic membrane, external ear and ear canal normal.  Nose: Mucosal edema and rhinorrhea present.  Mouth/Throat: Posterior oropharyngeal erythema present.  Eyes: Pupils are equal, round, and reactive to light.  Neck: Normal range of motion. Neck supple.  Cardiovascular: Normal rate, regular rhythm and normal heart sounds.   Pulmonary/Chest: Effort normal.  Musculoskeletal: Normal range of motion.  Lymphadenopathy:    She has cervical adenopathy.  Neurological: She is alert.  Skin: Skin is warm.  Psychiatric: She has a normal mood and affect.  Vitals reviewed.    UC Treatments / Results  Labs (all labs ordered are listed, but only abnormal results are displayed) Labs Reviewed - No data to display  EKG  EKG Interpretation None       Radiology No results found.  Procedures Procedures (including critical care time)  Medications Ordered in UC Medications - No data to display   Initial Impression / Assessment and Plan / UC Course  I have reviewed the triage vital signs and the nursing notes.  Pertinent labs & imaging results that were available during my care of the patient were reviewed by me and considered in my medical decision making (see chart for details).    Procedure have the flu. We'll place on Tamiflu 75 mg TWICE a day also place her on Allegra-D 1 tablet daily. Because of the mild coughing that she's been doing and significant coughing much some Tussionex 1 1:30 teaspoon twice a day for  one her that since she has an allergy oxycodone this may cause nausea and does she'll have to stop both his time with a small dose of hydrocodone  will allow her to rest and not having side effects Final Clinical Impressions(s) / UC Diagnoses   Final diagnoses:  Influenza-like illness  Flu-like symptoms    New Prescriptions New Prescriptions   CHLORPHENIRAMINE-HYDROCODONE (TUSSIONEX PENNKINETIC ER) 10-8 MG/5ML SUER    Take 2.5-5 mLs by mouth every 12 (twelve) hours as needed for cough. If nausea occurs may need to stop this medication   FEXOFENADINE-PSEUDOEPHEDRINE (ALLEGRA-D ALLERGY & CONGESTION) 180-240 MG 24 HR TABLET    Take 1 tablet by mouth daily.   OSELTAMIVIR (TAMIFLU) 75 MG CAPSULE    Take 1 capsule (75 mg total) by mouth 2 (two) times daily.      Note: This dictation was prepared with Dragon dictation along with smaller phrase technology. Any transcriptional errors that result from this process are unintentional.   Hassan Rowan, MD 12/13/16 1556    Hassan Rowan, MD 12/13/16 [phone]

## 2017-01-25 ENCOUNTER — Ambulatory Visit (INDEPENDENT_AMBULATORY_CARE_PROVIDER_SITE_OTHER): Payer: Medicare PPO | Admitting: Family Medicine

## 2017-01-25 ENCOUNTER — Encounter: Payer: Self-pay | Admitting: Family Medicine

## 2017-01-25 VITALS — BP 118/60 | HR 74 | Temp 98.4°F | Resp 16 | Ht 62.0 in | Wt 182.4 lb

## 2017-01-25 DIAGNOSIS — Z0000 Encounter for general adult medical examination without abnormal findings: Secondary | ICD-10-CM | POA: Diagnosis not present

## 2017-01-25 DIAGNOSIS — M8580 Other specified disorders of bone density and structure, unspecified site: Secondary | ICD-10-CM | POA: Diagnosis not present

## 2017-01-25 DIAGNOSIS — K219 Gastro-esophageal reflux disease without esophagitis: Secondary | ICD-10-CM

## 2017-01-25 DIAGNOSIS — M75102 Unspecified rotator cuff tear or rupture of left shoulder, not specified as traumatic: Secondary | ICD-10-CM

## 2017-01-25 DIAGNOSIS — I10 Essential (primary) hypertension: Secondary | ICD-10-CM | POA: Diagnosis not present

## 2017-01-25 MED ORDER — OMEPRAZOLE 40 MG PO CPDR: 40.0000 mg | DELAYED_RELEASE_CAPSULE | Freq: Every day | ORAL | 3 refills | Status: DC

## 2017-01-25 NOTE — Progress Notes (Signed)
Name: Jodi Kirby   MRN: [id number]    DOB: Sep 23, 1946   Date:01/25/2017       Progress Note  Subjective  Chief Complaint  Chief Complaint  Patient presents with  . Medicare Wellness    HPI   Functional ability/safety issues: having left shoulder pain, causes inability to reach above head Hearing issues: Addressed  Activities of daily living: Discussed Home safety issues: No Issues  End Of Life Planning: Offered verbal information regarding advanced directives, healthcare power of attorney.  Preventative care, Health maintenance, Preventative health measures discussed.  Preventative screenings discussed today: lab work, colonoscopy, PAP not indicated - s/p hysterectomy for benign causes, mammogram, DEXA.  Low Dose CT Chest recommended if Age 34-80 years, 30 pack-year currently smoking OR have quit w/in 15years.   Lifestyle risk factor issued reviewed: Diet, exercise, weight management, advised patient smoking is not healthy, nutrition/diet.  Preventative health measures discussed (5-10 year plan).  Reviewed and recommended vaccinations: - Pneumovax  - Prevnar  - Annual Influenza - Zostavax - Tdap   Depression screening: Done Fall risk screening: Done Discuss ADLs/IADLs: Done  Current medical providers: See HPI  Other health risk factors identified this visit: No other issues Cognitive impairment issues: None identified  All above discussed with patient. Appropriate education, counseling and referral will be made based upon the above.   Osteopenia: taking Fosamax, denies side effects, last bone density is up to date  GERD: under control with medication, denies indigestion, heartburn or regurgitation.  Left shoulder pain: she states that she has long history of left rotator cuff syndrome, but is having a flare, difficulty abduction or internally rotating left arm, it is keeping her up at night, no redness or swelling. She does not want to go back  to Ortho at this time, she states she wants to have steroid injection on left shoulder   Patient Active Problem List   Diagnosis Date Noted  . Hyperglycemia 11/17/2016  . Osteopenia 05/15/2016  . Insomnia 11/08/2015  . Angina pectoris ([place]) 07/10/2015  . Seasonal allergic rhinitis 07/09/2015  . GERD (gastroesophageal reflux disease) 07/09/2015  . Bee sting allergy 07/09/2015  . History of knee replacement procedure of right knee 04/29/2015  . History of colonic polyps 09/24/2014  . Fibrocystic breast 09/24/2014  . Arthritis of knee, degenerative 06/12/2014  . Hyperlipidemia 02/09/2013  . Coronary artery disease 02/09/2013  . Obstructive sleep apnea on CPAP 02/09/2013  . Hypertension 02/09/2013    Past Surgical History:  Procedure Laterality Date  . ABDOMINAL HYSTERECTOMY  1970  . ANKLE SURGERY Right 1980  . BLADDER SURGERY  2012  . CARDIAC CATHETERIZATION  2003   [place]  . CARDIAC CATHETERIZATION     [place]  . COLONOSCOPY  2008, 2015   Dr. Allen Norris  . CYSTOSTOMY    . KNEE SURGERY  08/18/2011   arthroscopic right  . LUMBAR FUSION     L4-5, S-1  . NECK SURGERY  2003  . TOTAL KNEE ARTHROPLASTY Right 04/29/2015   Procedure: TOTAL KNEE ARTHROPLASTY;  Surgeon: Leanor Kail, MD;  Location: [facility] ORS;  Service: Orthopedics;  Laterality: Right;  . TUBAL LIGATION      Family History  Problem Relation Age of Onset  . CAD Father     Social History   Social History  . Marital status: Married    Spouse name: N/A  . Number of children: N/A  . Years of education: N/A   Occupational History  . Not on file.  Social History Main Topics  . Smoking status: Never Smoker  . Smokeless tobacco: Never Used  . Alcohol use No  . Drug use: No  . Sexual activity: Yes    Partners: Male    Birth control/ protection: None   Other Topics Concern  . Not on file   Social History Narrative  . No narrative on file     Current Outpatient Prescriptions:  .  alendronate  (FOSAMAX) 70 MG tablet, TAKE 1 TABLET WEEKLY, Disp: 12 tablet, Rfl: 3 .  ALPRAZolam (XANAX) 0.5 MG tablet, Take 1 tablet (0.5 mg total) by mouth at bedtime as needed for anxiety., Disp: 30 tablet, Rfl: 0 .  Amlodipine-Valsartan-HCTZ 5-160-25 MG TABS, Take 1 tablet by mouth daily. (Patient taking differently: Take 1 tablet by mouth every morning. ), Disp: 90 tablet, Rfl: 1 .  aspirin EC 81 MG tablet, Take 81 mg by mouth every morning., Disp: , Rfl:  .  chlorpheniramine-HYDROcodone (TUSSIONEX PENNKINETIC ER) 10-8 MG/5ML SUER, Take 2.5-5 mLs by mouth every 12 (twelve) hours as needed for cough. If nausea occurs may need to stop this medication, Disp: 115 mL, Rfl: 0 .  Cholecalciferol (VITAMIN D3) 1000 UNITS CAPS, Take 1,000 Units by mouth every morning. , Disp: , Rfl:  .  EPINEPHrine (EPIPEN 2-PAK) 0.3 mg/0.3 mL DEVI, Inject 0.3 mg into the muscle as needed. , Disp: , Rfl:  .  fluticasone (FLONASE) 50 MCG/ACT nasal spray, USE TWO SPRAY(S) IN EACH NOSTRIL AT BEDTIME (Patient taking differently: USE TWO SPRAY(S) IN EACH NOSTRIL AT BEDTIME AS NEEDED FOR RHINITIS.), Disp: 48 g, Rfl: 0 .  loratadine (CLARITIN) 10 MG tablet, Take 1 tablet (10 mg total) by mouth daily. (Patient taking differently: Take 10 mg by mouth daily as needed for allergies. ), Disp: 30 tablet, Rfl: 5 .  meloxicam (MOBIC) 7.5 MG tablet, TAKE 1 TABLET BY MOUTH ONCE DAILY, Disp: , Rfl:  .  Multiple Vitamins-Minerals (CENTRUM SILVER PO), Take 1 tablet by mouth every morning. , Disp: , Rfl:  .  nitroGLYCERIN (NITROSTAT) 0.4 MG SL tablet, Place 1 tablet (0.4 mg total) under the tongue every 5 (five) minutes as needed for chest pain. (Patient not taking: Reported on 11/16/2016), Disp: 25 tablet, Rfl: 0 .  omeprazole (PRILOSEC) 40 MG capsule, Take 1 capsule (40 mg total) by mouth daily., Disp: 90 capsule, Rfl: 3  Allergies  Allergen Reactions  . Niaspan [Niacin Er] Other (See Comments)    Patient states she can't remember the reaction because  it so long ago.  . Oxycodone-Acetaminophen Nausea Only  . Statins Other (See Comments)    Muscle and joint pain.     ROS  Constitutional: Negative for fever or weight change.  Respiratory: Negative for cough and shortness of breath.   Cardiovascular: Positive for intermittent chest pain and  palpitations.  Gastrointestinal: Negative for abdominal pain, no bowel changes.  Musculoskeletal: Negative for gait problem or joint swelling. Pain on left shoulder Skin: Negative for rash.  Neurological: Negative for dizziness , positive for dull headache.  No other specific complaints in a complete review of systems (except as listed in HPI above).  Objective  Vitals:   01/25/17 0804  BP: 118/60  Pulse: 74  Resp: 16  Temp: 98.4 F (36.9 C)  SpO2: 97%  Weight: 182 lb 7 oz (82.8 kg)  Height: 5' 2"  (1.575 m)    Body mass index is 33.37 kg/m.  Physical Exam  Constitutional: Patient appears well-developed and obese. No distress.  HENT: Head: Normocephalic and atraumatic. Ears: B TMs ok, no erythema or effusion; Nose: Nose normal. Mouth/Throat: Oropharynx is clear and moist. No oropharyngeal exudate.  Eyes: Conjunctivae and EOM are normal. Pupils are equal, round, and reactive to light. No scleral icterus.  Neck: Normal range of motion. Neck supple. No JVD present. No thyromegaly present.  Cardiovascular: Normal rate, regular rhythm and normal heart sounds.  No murmur heard. No BLE edema. Pulmonary/Chest: Effort normal and breath sounds normal. No respiratory distress. Abdominal: Soft. Bowel sounds are normal, no distension. There is no tenderness. no masses Breast: no lumps or masses, no nipple discharge or rashes FEMALE GENITALIA:  External genitalia normal External urethra normal Pelvic not done RECTAL: not done Musculoskeletal:   Pain with abduction of left shoulder, also with internal rotation, decrease rom of left shoulder. Right knee well healed from right knee replacement, no  effusion Neurological: he is alert and oriented to person, place, and time. No cranial nerve deficit. Coordination, balance, strength, speech and gait are normal.  Skin: Skin is warm and dry. No rash noted. No erythema.  Psychiatric: Patient has a normal mood and affect. behavior is normal. Judgment and thought content normal.  Recent Results (from the past 2160 hour(s))  POCT urinalysis dipstick     Status: Normal   Collection Time: 11/16/16  8:36 AM  Result Value Ref Range   Color, UA straw    Clarity, UA clear    Glucose, UA negative    Bilirubin, UA negative    Ketones, UA negative    Spec Grav, UA 1.010    Blood, UA negative    pH, UA 6.5    Protein, UA negative    Urobilinogen, UA 0.2    Nitrite, UA negative    Leukocytes, UA Negative Negative  CBC with Differential/Platelet     Status: Abnormal   Collection Time: 11/16/16  8:38 AM  Result Value Ref Range   WBC 5.8 3.8 - 10.8 K/uL   RBC 4.15 3.80 - 5.10 MIL/uL   Hemoglobin 11.6 (L) 11.7 - 15.5 g/dL   HCT 35.2 35.0 - 45.0 %   MCV 84.8 80.0 - 100.0 fL   MCH 28.0 27.0 - 33.0 pg   MCHC 33.0 32.0 - 36.0 g/dL   RDW 13.6 11.0 - 15.0 %   Platelets 264 140 - 400 K/uL   MPV 10.1 7.5 - 12.5 fL   Neutro Abs 3,538 1,500 - 7,800 cells/uL   Lymphs Abs 1,682 850 - 3,900 cells/uL   Monocytes Absolute 522 200 - 950 cells/uL   Eosinophils Absolute 58 15 - 500 cells/uL   Basophils Absolute 0 0 - 200 cells/uL   Neutrophils Relative % 61 %   Lymphocytes Relative 29 %   Monocytes Relative 9 %   Eosinophils Relative 1 %   Basophils Relative 0 %   Smear Review Criteria for review not met   COMPLETE METABOLIC PANEL WITH GFR     Status: Abnormal   Collection Time: 11/16/16  8:38 AM  Result Value Ref Range   Sodium 142 135 - 146 mmol/L   Potassium 3.9 3.5 - 5.3 mmol/L   Chloride 105 98 - 110 mmol/L   CO2 27 20 - 31 mmol/L   Glucose, Bld 104 (H) 65 - 99 mg/dL   BUN 15 7 - 25 mg/dL   Creat 1.03 (H) 0.60 - 0.93 mg/dL    Comment:   For  patients > or = 72 years of age: The upper  reference limit for Creatinine is approximately 13% higher for people identified as African-American.      Total Bilirubin 0.4 0.2 - 1.2 mg/dL   Alkaline Phosphatase 73 33 - 130 U/L   AST 17 10 - 35 U/L   ALT 18 6 - 29 U/L   Total Protein 6.6 6.1 - 8.1 g/dL   Albumin 4.2 3.6 - 5.1 g/dL   Calcium 9.6 8.6 - 10.4 mg/dL   GFR, Est African American 63 >=60 mL/min   GFR, Est Non African American 55 (L) >=60 mL/min  VITAMIN D 25 Hydroxy (Vit-D Deficiency, Fractures)     Status: None   Collection Time: 11/16/16  8:38 AM  Result Value Ref Range   Vit D, 25-Hydroxy 40 30 - 100 ng/mL    Comment: Vitamin D Status           25-OH Vitamin D        Deficiency                <20 ng/mL        Insufficiency         20 - 29 ng/mL        Optimal             > or = 30 ng/mL   For 25-OH Vitamin D testing on patients on D2-supplementation and patients for whom quantitation of D2 and D3 fractions is required, the [facility] 25-OH VIT D, (D2,D3), LC/MS/MS is recommended: order code [id number] (patients > 2 yrs).   Iron, TIBC and Ferritin Panel     Status: None   Collection Time: 11/16/16  8:38 AM  Result Value Ref Range   Ferritin 150 20 - 288 ng/mL   Iron 93 45 - 160 ug/dL   TIBC 277 250 - 450 ug/dL   %SAT 34 11 - 50 %  Urine Culture     Status: None   Collection Time: 11/16/16  8:52 AM  Result Value Ref Range   Organism ID, Bacteria      Multiple organisms present,each less than 10,000 CFU/mL. These organisms,commonly found on external and internal genitalia,are considered colonizers. No further testing performed.       PHQ2/9: Depression screen [facility] 2/9 01/25/2017 05/15/2016 01/14/2016 12/09/2015 11/08/2015  Decreased Interest 0 0 0 0 0  Down, Depressed, Hopeless 0 0 0 0 0  PHQ - 2 Score 0 0 0 0 0     Fall Risk: Fall Risk  01/25/2017 05/15/2016 01/14/2016 12/09/2015 11/08/2015  Falls in the past year? No No No No No     Functional Status Survey: Is  the patient deaf or have difficulty hearing?: No Does the patient have difficulty seeing, even when wearing glasses/contacts?: No Does the patient have difficulty concentrating, remembering, or making decisions?: No Does the patient have difficulty walking or climbing stairs?: No Does the patient have difficulty dressing or bathing?: No Does the patient have difficulty doing errands alone such as visiting a doctor's office or shopping?: No    Assessment & Plan  1. Medicare annual wellness visit, subsequent  Discussed importance of 150 minutes of physical activity weekly, eat two servings of fish weekly, eat one serving of tree nuts ( cashews, pistachios, pecans, almonds.Marland Kitchen) every other day, eat 6 servings of fruit/vegetables daily and drink plenty of water and avoid sweet beverages.   2. Osteopenia, unspecified location  Continue Fosamax  3. Essential hypertension  At goal, keep follow up with Cardiologist this week, only taking half dose  of Exforge and bp is at goal  4. Gastroesophageal reflux disease without esophagitis  - omeprazole (PRILOSEC) 40 MG capsule; Take 1 capsule (40 mg total) by mouth daily.  Dispense: 90 capsule; Refill: 3 Discussed long term risk of medication , such as heart disease, alzheimer's and colitis  5. Rotator cuff syndrome, left  She will return for steroid injection

## 2017-01-25 NOTE — Patient Instructions (Signed)
Shoulder Impingement Syndrome Shoulder impingement syndrome is a condition that causes pain when connective tissues (tendons) surrounding the shoulder joint become pinched. These tendons are part of the group of muscles and tissues that help to stabilize the shoulder (rotator cuff). Beneath the rotator cuff is a fluid-filled sac (bursa) that allows the muscles and tendons to glide smoothly. The bursa may become swollen or irritated (bursitis). Bursitis, swelling in the rotator cuff tendons, or both conditions can decrease how much space is under a bone in the shoulder joint (acromion), resulting in impingement. What are the causes? Shoulder impingement syndrome can be caused by bursitis or swelling of the rotator cuff tendons, which may result from: Repetitive overhead arm movements. Falling onto the shoulder. Weakness in the shoulder muscles. What increases the risk? You may be more likely to develop this condition if you are an athlete who participates in: Sports that involve throwing, such as baseball. Tennis. Swimming. Volleyball. Some people are also more likely to develop impingement syndrome because of the shape of their acromion bone. What are the signs or symptoms? The main symptom of this condition is pain on the front or side of the shoulder. Pain may: Get worse when lifting or raising the arm. Get worse at night. Wake you up from sleeping. Feel sharp when the shoulder is moved, and then fade to an ache. Other signs and symptoms may include: Tenderness. Stiffness. Inability to raise the arm above shoulder level or behind the body. Weakness. How is this diagnosed? This condition may be diagnosed based on: Your symptoms. Your medical history. A physical exam. Imaging tests, such as: X-rays. MRI. Ultrasound. How is this treated? Treatment for this condition may include: Resting your shoulder and avoiding all activities that cause pain or put stress on the shoulder. Icing  your shoulder. NSAIDs to help reduce pain and swelling. One or more injections of medicines to numb the area and reduce inflammation. Physical therapy. Surgery. This may be needed if nonsurgical treatments have not helped. Surgery may involve repairing the rotator cuff, reshaping the acromion, or removing the bursa. Follow these instructions at home: Managing pain, stiffness, and swelling  If directed, apply ice to the injured area. Put ice in a plastic bag. Place a towel between your skin and the bag. Leave the ice on for 20 minutes, 2-3 times a day. Activity  Rest and return to your normal activities as told by your health care provider. Ask your health care provider what activities are safe for you. Do exercises as told by your health care provider. General instructions  Do not use any tobacco products, including cigarettes, chewing tobacco, or e-cigarettes. Tobacco can delay healing. If you need help quitting, ask your health care provider. Ask your health care provider when it is safe for you to drive. Take over-the-counter and prescription medicines only as told by your health care provider. Keep all follow-up visits as told by your health care provider. This is important. How is this prevented? Give your body time to rest between periods of activity. Be safe and responsible while being active to avoid falls. Maintain physical fitness, including strength and flexibility. Contact a health care provider if: Your symptoms have not improved after 1-2 months of treatment and rest. You cannot lift your arm away from your body. This information is not intended to replace advice given to you by your health care provider. Make sure you discuss any questions you have with your health care provider. Document Released: 10/19/2005 Document Revised: 06/25/2016  Document Reviewed: 09/21/2015 Elsevier Interactive Patient Education  2017 [employer] Rotator Cuff Tear Rehab After Surgery Ask your  health care provider which exercises are safe for you. Do exercises exactly as told by your health care provider and adjust them as directed. It is normal to feel mild stretching, pulling, tightness, or discomfort as you do these exercises, but you should stop right away if you feel sudden pain or your pain gets worse. Do not begin these exercises until told by your health care provider. Stretching and range of motion exercises These exercises warm up your muscles and joints and improve the movement and flexibility of your shoulder. These exercises also help to relieve pain, numbness, and tingling. Exercise A: Pendulum   1. Stand near a wall or a surface that you can hold onto for balance. 2. Bend at the waist and let your left / right arm hang straight down. Use your other arm to keep your balance. 3. Relax your arm and shoulder muscles, and move your hips and your trunk so your left / right arm swings freely. Your arm should swing because of the motion of your body, not because you are using your arm or shoulder muscles. 4. Keep moving so your arm swings in the following directions, as told by your health care provider:  Side to side.  Forward and backward.  In clockwise and counterclockwise circles. Repeat __________ times, or for __________ seconds per direction. Complete this exercise __________ times a day. Exercise B: Flexion, seated   1. Sit in a stable chair so your left / right forearm can rest on a flat surface. Your elbow should rest at a height that keeps your upper arm next to your body. 2. Keeping your shoulder relaxed, lean forward at the waist and let your hand slide forward. Stop when you feel a stretch in your shoulder, or when you reach the angle that is recommended by your health care provider. 3. Hold for __________ seconds. 4. Slowly return to the starting position. Repeat __________ times. Complete this exercise __________ times a day. Exercise C: Flexion, standing    1. Stand and hold a broomstick, a cane, or a similar object. Place your hands a little more than shoulder-width apart on the object. Your left / right hand should be palm-up, and your other hand should be palm-down. 2. Push the stick down with your healthy arm to raise your left / right arm in front of your body, and then over your head. Use your other hand to help move the stick. Stop when you feel a stretch in your shoulder, or when you reach the angle that is recommended by your health care provider.  Avoid shrugging your shoulder while you raise your arm. Keep your shoulder blade tucked down toward your spine.  Keep your left / right shoulder muscles relaxed. 3. Hold for __________ seconds. 4. Slowly return to the starting position. Repeat __________ times. Complete this exercise __________ times a day. Exercise D: Abduction, supine   1. Lie on your back and hold a broomstick, a cane, or a similar object. Place your hands a little more than shoulder-width apart on the object. Your left / right hand should be palm-up, and your other hand should be palm-down. 2. Push the stick to raise your left / right arm out to your side and then over your head. Use your other hand to help move the stick. Stop when you feel a stretch in your shoulder, or when you reach  the angle that is recommended by your health care provider.  Avoid shrugging your shoulder while you raise your arm. Keep your shoulder blade tucked down toward your spine. 3. Hold for __________ seconds. 4. Slowly return to the starting position. Repeat __________ times. Complete this exercise __________ times a day. Exercise E: Shoulder flexion, active-assisted   1. Lie on your back. You may bend your knees for comfort. 2. Hold a broomstick, a cane, or a similar object so your hands are about shoulder-width apart. Your palms should face toward your feet. 3. Raise your left / right arm over your head and behind your head, toward the  floor. Use your other hand to help you do this. Stop when you feel a gentle stretch in your shoulder, or when you reach the angle that is recommended by your health care provider. 4. Hold for __________ seconds. 5. Use the broomstick and your other arm to help you return your left / right arm to the starting position. Repeat __________ times. Complete this exercise __________ times a day. Exercise F: External rotation   1. Sit in a stable chair without armrests, or stand. 2. Tuck a soft object, such as a folded towel or a small ball, under your left / right upper arm. 3. Hold a broomstick, a cane, or a similar object so your palms face down, toward the floor. Bend your elbows to an "L" shape (90 degrees), and keep your hands about shoulder-width apart. 4. Straighten your healthy arm and push the broomstick across your body, toward your left / right side. Keep your left / right arm bent. This will rotate your left / right forearm away from your body. 5. Hold for __________ seconds. 6. Slowly return to the starting position. Repeat __________ times. Complete this exercise __________ times a day. Strengthening exercises These exercises build strength and endurance in your shoulder. Endurance is the ability to use your muscles for a long time, even after they get tired. Exercise G: Shoulder flexion, isometric   1. Stand or sit about 4-6 inches (10-15 cm) away from a wall with your left / right side facing the wall. 2. Gently make a fist and place your left / right hand on the wall so the top of your fist touches the wall. 3. With your left / right elbow straight, gently press the top of your fist into the wall. Gradually increase the pressure until you are pressing as hard as you can without shrugging your shoulder. 4. Hold for __________ seconds. 5. Slowly release the tension and relax your muscles completely before you repeat the exercise. Repeat __________ times. Complete this exercise  __________ times a day. Exercise H: Shoulder abduction, isometric   1. Stand or sit about 4-6 inches (10-15 cm) away from a wall with your right/left side facing the wall. 2. Bend your left / right elbow and gently press your elbow into the wall as if you are trying to move your arm out to your side. Increase the pressure gradually until you are pressing as hard as you can without shrugging your shoulder. 3. Hold for __________ seconds. 4. Slowly release the tension and relax your muscles completely before repeating the exercise. Repeat __________ times. Complete this exercise __________ times a day. Exercise I: Internal rotation, isometric   1. Stand or sit in a doorway, facing the door frame. 2. Bend your left / right elbow and place the palm of your hand against the door frame. Only your palm should be  touching the frame. Keep your upper arm at your side. 3. Gently press your hand into the door frame, as if you are trying to push your arm toward your abdomen. Do not let your wrist bend.  Avoid shrugging your shoulder while you press your hand into the door frame. Keep your shoulder blade tucked down toward the middle of your back. 4. Hold for __________ seconds. 5. Slowly release the tension, and relax your muscles completely before you repeat the exercise. Repeat __________ times. Complete this exercise __________ times a day. Exercise J: External rotation, isometric   1. Stand or sit in a doorway, facing the door frame. 2. Bend your left / right elbow and place the back of your wrist against the door frame. Only the back of your wrist should be touching the frame. Keep your upper arm at your side. 3. Gently press your wrist against the door frame, as if you are trying to push your arm away from your abdomen.  Avoid shrugging your shoulder while you press your wrist into the door frame. Keep your shoulder blade tucked down toward the middle of your back. 4. Hold for __________  seconds. 5. Slowly release the tension, and relax your muscles completely before you repeat the exercise. Repeat __________ times. Complete this exercise __________ times a day. This information is not intended to replace advice given to you by your health care provider. Make sure you discuss any questions you have with your health care provider. Document Released: 10/19/2005 Document Revised: 06/25/2016 Document Reviewed: 11/02/2015 Elsevier Interactive Patient Education  2017 [employer]

## 2017-01-26 DIAGNOSIS — G4733 Obstructive sleep apnea (adult) (pediatric): Secondary | ICD-10-CM | POA: Diagnosis not present

## 2017-01-26 NOTE — Progress Notes (Signed)
Cardiology Office Note  Date:  01/28/2017   ID:  Ivadell, Gaul 1946-03-09, MRN [id number]  PCP:  [name], MD   Chief Complaint  Patient presents with  . other    6 month f/u c/o fatigue. Meds reviewed verbally with pt.    HPI:  Jodi Kirby is a very pleasant 71 year old woman with a history of  coronary  arterial disease, catheterization in 2003 with 20% disease in her RCA and LAD,  hyperlipidemia, back surgery,  obstructive sleep apnea on CPAP,  patient of Dr. Carlynn Purl who previously presented for symptoms of chest pressure.  She presents for routine followup of her CAD, hyperlipidemia  Since her last clinic visit in 2016 she was admitted to the hospital August 2017 for chest pain Hospital records reviewed Cardiac enzymes negative,  She had outpatient Exercise stress Myoview test 07/29/2016 showing no ischemia, Ejection fraction 62%   weight is higher Previous total knee replacement on the right by Dr. Kristeen Mans to the gym 3 days a week, Some aerobic, Denies any chest pain with exertion  Intolerant of statins even very low dose Crestor.  Interestingly review of her lab work shows well controlled cholesterol when she was in the hospital August 2017 She was not discharged on a statin and does not take a statin in general LDL was down from 120 down to 70  tried several other statins. Each of these caused cramping She is not interested in Zetia at this time  Denies any chest pain. Chronic back, knee, ankle pain  EKG on today's visit shows normal sinus rhythm with rate 71 bpm, rare APC  Other past medical history Stress test in 2014 And August 2017 showing no ischemia   PMH:   has a past medical history of Allergic rhinitis, cause unspecified; Coronary artery disease; Dysmetabolic syndrome X; Dysuria; Esophageal reflux; Essential hypertension, benign; Hyperlipidemia; Lumbago; Lump or mass in breast; Myalgia and myositis, unspecified; Obstructive  sleep apnea; Osteoarthrosis, unspecified whether generalized or localized, lower leg; Other abnormal glucose; Other and unspecified angina pectoris; 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  PSH:    Past Surgical History:  Procedure Laterality Date  . ABDOMINAL HYSTERECTOMY  1970  . ANKLE SURGERY Right 1980  . BLADDER SURGERY  2012  . CARDIAC CATHETERIZATION  2003   [facility]  . CARDIAC CATHETERIZATION     [facility]  . COLONOSCOPY  2008, 2015   Dr. Servando Snare  . CYSTOSTOMY    . KNEE SURGERY  08/18/2011   arthroscopic right  . LUMBAR FUSION     L4-5, S-1  . NECK SURGERY  2003  . TOTAL KNEE ARTHROPLASTY Right 04/29/2015   Procedure: TOTAL KNEE ARTHROPLASTY;  Surgeon: Erin Sons, MD;  Location: [facility] ORS;  Service: Orthopedics;  Laterality: Right;  . TUBAL LIGATION      Current Outpatient Prescriptions  Medication Sig Dispense Refill  . alendronate (FOSAMAX) 70 MG tablet TAKE 1 TABLET WEEKLY 12 tablet 3  . ALPRAZolam (XANAX) 0.5 MG tablet Take 1 tablet (0.5 mg total) by mouth at bedtime as needed for anxiety. 30 tablet 0  . Amlodipine-Valsartan-HCTZ 5-160-25 MG TABS Take 1 tablet by mouth daily. (Patient taking differently: Take 1 tablet by mouth every morning. ) 90 tablet 1  . aspirin EC 81 MG tablet Take 81 mg by mouth every morning.    . Cholecalciferol (VITAMIN D3) 1000 UNITS CAPS  Take 1,000 Units by mouth every morning.     Marland Kitchen. EPINEPHrine (EPIPEN 2-PAK) 0.3 mg/0.3 mL DEVI Inject 0.3 mg into the muscle as needed.     . fluticasone (FLONASE) 50 MCG/ACT nasal spray USE TWO SPRAY(S) IN EACH NOSTRIL AT BEDTIME (Patient taking differently: USE TWO SPRAY(S) IN EACH NOSTRIL AT BEDTIME AS NEEDED FOR RHINITIS.) 48 g 0  . loratadine (CLARITIN) 10 MG tablet Take 1 tablet (10 mg total) by mouth daily. (Patient taking differently: Take 10 mg by mouth daily as  needed for allergies. ) 30 tablet 5  . meloxicam (MOBIC) 7.5 MG tablet TAKE 1 TABLET BY MOUTH ONCE DAILY    . Multiple Vitamins-Minerals (CENTRUM SILVER PO) Take 1 tablet by mouth every morning.     . nitroGLYCERIN (NITROSTAT) 0.4 MG SL tablet Place 1 tablet (0.4 mg total) under the tongue every 5 (five) minutes as needed for chest pain. 25 tablet 0  . omeprazole (PRILOSEC) 40 MG capsule Take 1 capsule (40 mg total) by mouth daily. 90 capsule 3   No current facility-administered medications for this visit.      Allergies:   Niaspan [niacin er]; Oxycodone-acetaminophen; and Statins   Social History:  The patient  reports that she has never smoked. She has never used smokeless tobacco. She reports that she does not drink alcohol or use drugs.   Family History:   family history includes CAD in her father.    Review of Systems: Review of Systems  Constitutional: Negative.   Respiratory: Negative.   Cardiovascular: Negative.   Gastrointestinal: Negative.   Musculoskeletal: Negative.   Neurological: Negative.   Psychiatric/Behavioral: Negative.   All other systems reviewed and are negative.    PHYSICAL EXAM: VS:  BP 138/60 (BP Location: Left Arm, Patient Position: Sitting, Cuff Size: Normal)   Pulse 71   Ht 5\' 2"  (1.575 m)   Wt 181 lb (82.1 kg)   BMI 33.11 kg/m  , BMI Body mass index is 33.11 kg/m. GEN: Well nourished, well developed, in no acute distress  HEENT: normal  Neck: no JVD, carotid bruits, or masses Cardiac: RRR; no murmurs, rubs, or gallops,no edema  Respiratory:  clear to auscultation bilaterally, normal work of breathing GI: soft, nontender, nondistended, + BS MS: no deformity or atrophy  Skin: warm and dry, no rash Neuro:  Strength and sensation are intact Psych: euthymic mood, full affect    Recent Labs: 06/28/2016: TSH 1.396 11/16/2016: ALT 18; BUN 15; Creat 1.03; Hemoglobin 11.6; Platelets 264; Potassium 3.9; Sodium 142    Lipid Panel Lab Results   Component Value Date   CHOL 149 06/28/2016   HDL 46 06/28/2016   LDLCALC 78 06/28/2016   TRIG 125 06/28/2016      Wt Readings from Last 3 Encounters:  01/28/17 181 lb (82.1 kg)  01/25/17 182 lb 7 oz (82.8 kg)  12/13/16 175 lb (79.4 kg)       ASSESSMENT AND PLAN:  Mixed hyperlipidemia - Plan: EKG 12-Lead She has declined statin And Zetia  Coronary artery disease due to lipid rich plaque - Plan: EKG 12-Lead Cardiac catheterization many years ago with minimal nonobstructive disease She does not want a statin at this time Atypical chest pain in the past none recently with exertion No further testing at this time Previous hospital records reviewed  Essential hypertension - Plan: EKG 12-Lead Blood pressure is well controlled on today's visit. No changes made to the medications.  Obstructive sleep apnea on CPAP - Plan: EKG  12-Lead We have encouraged continued exercise, careful diet management in an effort to lose weight.  Chest pain, unspecified type Periodic episodes of chest pain, previous hospitalization August 2017 Recommended if she has any recurrent symptoms that she call our office We would recommend CT coronary calcium score for risk stratification There is some atypical components, presents at rest not with exertion  Disposition:   F/U  12 months   Total encounter time more than 25 minutes  Greater than 50% was spent in counseling and coordination of care with the patient    Orders Placed This Encounter  Procedures  . EKG 12-Lead     Signed, Dossie Arbour, M.D., Ph.D. 01/28/2017  [facility] [place], [place] [phone]

## 2017-01-28 ENCOUNTER — Ambulatory Visit (INDEPENDENT_AMBULATORY_CARE_PROVIDER_SITE_OTHER): Payer: Medicare PPO | Admitting: Cardiovascular Disease

## 2017-01-28 ENCOUNTER — Ambulatory Visit (INDEPENDENT_AMBULATORY_CARE_PROVIDER_SITE_OTHER): Payer: Medicare PPO | Admitting: Family Medicine

## 2017-01-28 ENCOUNTER — Encounter: Payer: Self-pay | Admitting: Cardiovascular Disease

## 2017-01-28 VITALS — BP 138/60 | HR 71 | Ht 62.0 in | Wt 181.0 lb

## 2017-01-28 DIAGNOSIS — G4733 Obstructive sleep apnea (adult) (pediatric): Secondary | ICD-10-CM

## 2017-01-28 DIAGNOSIS — M75102 Unspecified rotator cuff tear or rupture of left shoulder, not specified as traumatic: Secondary | ICD-10-CM

## 2017-01-28 DIAGNOSIS — Z9989 Dependence on other enabling machines and devices: Secondary | ICD-10-CM | POA: Diagnosis not present

## 2017-01-28 DIAGNOSIS — I2583 Coronary atherosclerosis due to lipid rich plaque: Secondary | ICD-10-CM | POA: Diagnosis not present

## 2017-01-28 DIAGNOSIS — I10 Essential (primary) hypertension: Secondary | ICD-10-CM

## 2017-01-28 DIAGNOSIS — R079 Chest pain, unspecified: Secondary | ICD-10-CM

## 2017-01-28 DIAGNOSIS — E782 Mixed hyperlipidemia: Secondary | ICD-10-CM

## 2017-01-28 DIAGNOSIS — I2510 Atherosclerotic heart disease of native coronary artery without angina pectoris: Secondary | ICD-10-CM | POA: Diagnosis not present

## 2017-01-28 DIAGNOSIS — M25512 Pain in left shoulder: Secondary | ICD-10-CM | POA: Diagnosis not present

## 2017-01-28 MED ORDER — TRIAMCINOLONE ACETONIDE 40 MG/ML IJ SUSP
40.0000 mg | Freq: Once | INTRAMUSCULAR | Status: AC
  Administered 2017-01-28: 40 mg via INTRAMUSCULAR

## 2017-01-28 MED ORDER — LIDOCAINE HCL (PF) 1 % IJ SOLN
2.0000 mL | Freq: Once | INTRAMUSCULAR | Status: AC
  Administered 2017-01-28: 2 mL via INTRADERMAL

## 2017-01-28 NOTE — Progress Notes (Signed)
Name: Jodi Kirby   MRN: [id number]    DOB: [birth year]   Date:01/28/2017       Progress Note  Subjective  Chief Complaint  Chief Complaint  Patient presents with  . Shoulder Pain    Left shoulder was told to come in for Cortisone injection    HPI  Left shoulder pain: she states that she has long history of left rotator cuff syndrome, but is having a flare, difficulty abduction or internally rotating left arm, it is keeping her up at night, no redness or swelling. She does not want to go back to Ortho at this time,she came in today for steroid injection  Patient Active Problem List   Diagnosis Date Noted  . Hyperglycemia 11/17/2016  . Chest pain 06/27/2016  . Osteopenia 05/15/2016  . Insomnia 11/08/2015  . Angina pectoris ([place]) 07/10/2015  . Seasonal allergic rhinitis 07/09/2015  . GERD (gastroesophageal reflux disease) 07/09/2015  . Bee sting allergy 07/09/2015  . History of knee replacement procedure of right knee 04/29/2015  . History of colonic polyps 09/24/2014  . Fibrocystic breast 09/24/2014  . Arthritis of knee, degenerative 06/12/2014  . Hyperlipidemia 02/09/2013  . Coronary artery disease 02/09/2013  . Obstructive sleep apnea on CPAP 02/09/2013  . Hypertension 02/09/2013    Social History  Substance Use Topics  . Smoking status: Never Smoker  . Smokeless tobacco: Never Used  . Alcohol use No     Current Outpatient Prescriptions:  .  alendronate (FOSAMAX) 70 MG tablet, TAKE 1 TABLET WEEKLY, Disp: 12 tablet, Rfl: 3 .  ALPRAZolam (XANAX) 0.5 MG tablet, Take 1 tablet (0.5 mg total) by mouth at bedtime as needed for anxiety., Disp: 30 tablet, Rfl: 0 .  Amlodipine-Valsartan-HCTZ 5-160-25 MG TABS, Take 1 tablet by mouth daily. (Patient taking differently: Take 1 tablet by mouth every morning. ), Disp: 90 tablet, Rfl: 1 .  aspirin EC 81 MG tablet, Take 81 mg by mouth every morning., Disp: , Rfl:  .  Cholecalciferol (VITAMIN D3) 1000 UNITS CAPS, Take 1,000  Units by mouth every morning. , Disp: , Rfl:  .  EPINEPHrine (EPIPEN 2-PAK) 0.3 mg/0.3 mL DEVI, Inject 0.3 mg into the muscle as needed. , Disp: , Rfl:  .  fluticasone (FLONASE) 50 MCG/ACT nasal spray, USE TWO SPRAY(S) IN EACH NOSTRIL AT BEDTIME (Patient taking differently: USE TWO SPRAY(S) IN EACH NOSTRIL AT BEDTIME AS NEEDED FOR RHINITIS.), Disp: 48 g, Rfl: 0 .  loratadine (CLARITIN) 10 MG tablet, Take 1 tablet (10 mg total) by mouth daily. (Patient taking differently: Take 10 mg by mouth daily as needed for allergies. ), Disp: 30 tablet, Rfl: 5 .  meloxicam (MOBIC) 7.5 MG tablet, TAKE 1 TABLET BY MOUTH ONCE DAILY, Disp: , Rfl:  .  Multiple Vitamins-Minerals (CENTRUM SILVER PO), Take 1 tablet by mouth every morning. , Disp: , Rfl:  .  nitroGLYCERIN (NITROSTAT) 0.4 MG SL tablet, Place 1 tablet (0.4 mg total) under the tongue every 5 (five) minutes as needed for chest pain., Disp: 25 tablet, Rfl: 0 .  omeprazole (PRILOSEC) 40 MG capsule, Take 1 capsule (40 mg total) by mouth daily., Disp: 90 capsule, Rfl: 3  Allergies  Allergen Reactions  . Niaspan [Niacin Er] Other (See Comments)    Patient states she can't remember the reaction because it so long ago.  . Oxycodone-Acetaminophen Nausea Only  . Statins Other (See Comments)    Muscle and joint pain.    ROS  Ten systems reviewed and is negative  except as mentioned in HPI   Objective  There were no vitals filed for this visit.  There is no height or weight on file to calculate BMI.    Physical Exam  Constitutional: Patient appears well-developed and well-nourished. Obese  No distress.  Muscular Skeletal: positive impingement sign on left side, pain during palpation of posterior shoulder, decrease rom on all directions Psychiatric: Patient has a normal mood and affect. behavior is normal. Judgment and thought content normal.  Recent Results (from the past 2160 hour(s))  POCT urinalysis dipstick     Status: Normal   Collection Time:  11/16/16  8:36 AM  Result Value Ref Range   Color, UA straw    Clarity, UA clear    Glucose, UA negative    Bilirubin, UA negative    Ketones, UA negative    Spec Grav, UA 1.010    Blood, UA negative    pH, UA 6.5    Protein, UA negative    Urobilinogen, UA 0.2    Nitrite, UA negative    Leukocytes, UA Negative Negative  CBC with Differential/Platelet     Status: Abnormal   Collection Time: 11/16/16  8:38 AM  Result Value Ref Range   WBC 5.8 3.8 - 10.8 K/uL   RBC 4.15 3.80 - 5.10 MIL/uL   Hemoglobin 11.6 (L) 11.7 - 15.5 g/dL   HCT 35.2 35.0 - 45.0 %   MCV 84.8 80.0 - 100.0 fL   MCH 28.0 27.0 - 33.0 pg   MCHC 33.0 32.0 - 36.0 g/dL   RDW 13.6 11.0 - 15.0 %   Platelets 264 140 - 400 K/uL   MPV 10.1 7.5 - 12.5 fL   Neutro Abs 3,538 1,500 - 7,800 cells/uL   Lymphs Abs 1,682 850 - 3,900 cells/uL   Monocytes Absolute 522 200 - 950 cells/uL   Eosinophils Absolute 58 15 - 500 cells/uL   Basophils Absolute 0 0 - 200 cells/uL   Neutrophils Relative % 61 %   Lymphocytes Relative 29 %   Monocytes Relative 9 %   Eosinophils Relative 1 %   Basophils Relative 0 %   Smear Review Criteria for review not met   COMPLETE METABOLIC PANEL WITH GFR     Status: Abnormal   Collection Time: 11/16/16  8:38 AM  Result Value Ref Range   Sodium 142 135 - 146 mmol/L   Potassium 3.9 3.5 - 5.3 mmol/L   Chloride 105 98 - 110 mmol/L   CO2 27 20 - 31 mmol/L   Glucose, Bld 104 (H) 65 - 99 mg/dL   BUN 15 7 - 25 mg/dL   Creat 1.03 (H) 0.60 - 0.93 mg/dL    Comment:   For patients > or = 72 years of age: The upper reference limit for Creatinine is approximately 13% higher for people identified as African-American.      Total Bilirubin 0.4 0.2 - 1.2 mg/dL   Alkaline Phosphatase 73 33 - 130 U/L   AST 17 10 - 35 U/L   ALT 18 6 - 29 U/L   Total Protein 6.6 6.1 - 8.1 g/dL   Albumin 4.2 3.6 - 5.1 g/dL   Calcium 9.6 8.6 - 10.4 mg/dL   GFR, Est African American 63 >=60 mL/min   GFR, Est Non African  American 55 (L) >=60 mL/min  VITAMIN D 25 Hydroxy (Vit-D Deficiency, Fractures)     Status: None   Collection Time: 11/16/16  8:38 AM  Result Value Ref Range  Vit D, 25-Hydroxy 40 30 - 100 ng/mL    Comment: Vitamin D Status           25-OH Vitamin D        Deficiency                <20 ng/mL        Insufficiency         20 - 29 ng/mL        Optimal             > or = 30 ng/mL   For 25-OH Vitamin D testing on patients on D2-supplementation and patients for whom quantitation of D2 and D3 fractions is required, the QuestAssureD 25-OH VIT D, (D2,D3), LC/MS/MS is recommended: order code [id number] (patients > 2 yrs).   Iron, TIBC and Ferritin Panel     Status: None   Collection Time: 11/16/16  8:38 AM  Result Value Ref Range   Ferritin 150 20 - 288 ng/mL   Iron 93 45 - 160 ug/dL   TIBC 277 250 - 450 ug/dL   %SAT 34 11 - 50 %  Urine Culture     Status: None   Collection Time: 11/16/16  8:52 AM  Result Value Ref Range   Organism ID, Bacteria      Multiple organisms present,each less than 10,000 CFU/mL. These organisms,commonly found on external and internal genitalia,are considered colonizers. No further testing performed.      Assessment & Plan  1. Acute pain of left shoulder  Consent form signed Localized area of worse pain on left shoulder ( posteriorly )  Area prepped with alcohol  Injection with lidocaine 1% and Kenalog 77m/1 ml on posterior shoulder ( left ), symptoms improved with lidocaine, but explained pain may get worse tonight Patient tolerated procedure well No side effects - triamcinolone acetonide (KENALOG-40) injection 40 mg; Inject 1 mL (40 mg total) into the muscle once. - lidocaine (PF) (XYLOCAINE) 1 % injection 2 mL; Inject 2 mLs into the skin once.  2. Rotator cuff syndrome of left shoulder  - triamcinolone acetonide (KENALOG-40) injection 40 mg; Inject 1 mL (40 mg total) into the muscle once. - lidocaine (PF) (XYLOCAINE) 1 % injection 2 mL; Inject 2 mLs  into the skin once.

## 2017-01-28 NOTE — Patient Instructions (Addendum)
Medication Instructions:   No medication changes made  Labwork:  No new labs needed  Testing/Procedures:  If you have more chest pains, We could order a CT coronary calcium score    I recommend watching educational videos on topics of interest to you at:       [email or web address]    Follow-Up: It was a pleasure seeing you in the office today. Please call us if you have new issues that need to be addressed before your next appt.  [phone]  Your physician wants you to follow-up in: 12 months.  You will receive a reminder letter in the mail two months in advance. If you don't receive a letter, please call our office to schedule the follow-up appointment.  If you need a refill on your cardiac medications before your next appointment, please call your pharmacy.

## 2017-02-09 DIAGNOSIS — R399 Unspecified symptoms and signs involving the genitourinary system: Secondary | ICD-10-CM | POA: Diagnosis not present

## 2017-02-09 DIAGNOSIS — N390 Urinary tract infection, site not specified: Secondary | ICD-10-CM | POA: Diagnosis not present

## 2017-02-09 DIAGNOSIS — R319 Hematuria, unspecified: Secondary | ICD-10-CM | POA: Diagnosis not present

## 2017-03-12 ENCOUNTER — Ambulatory Visit: Payer: Medicare PPO | Admitting: Urology

## 2017-03-18 ENCOUNTER — Other Ambulatory Visit: Payer: Self-pay

## 2017-03-18 DIAGNOSIS — R3129 Other microscopic hematuria: Secondary | ICD-10-CM

## 2017-03-19 ENCOUNTER — Ambulatory Visit (INDEPENDENT_AMBULATORY_CARE_PROVIDER_SITE_OTHER): Payer: Medicare PPO | Admitting: Urology

## 2017-03-19 ENCOUNTER — Encounter: Payer: Self-pay | Admitting: Urology

## 2017-03-19 ENCOUNTER — Other Ambulatory Visit
Admission: RE | Admit: 2017-03-19 | Discharge: 2017-03-19 | Disposition: A | Discharge status: Home / Self Care | Payer: Medicare PPO | Source: Ambulatory Visit | Attending: Urology | Admitting: Urology

## 2017-03-19 VITALS — BP 157/71 | HR 103 | Ht 62.0 in | Wt 181.0 lb

## 2017-03-19 DIAGNOSIS — R3129 Other microscopic hematuria: Secondary | ICD-10-CM | POA: Diagnosis not present

## 2017-03-19 DIAGNOSIS — R310 Gross hematuria: Secondary | ICD-10-CM

## 2017-03-19 DIAGNOSIS — R351 Nocturia: Secondary | ICD-10-CM | POA: Diagnosis not present

## 2017-03-19 DIAGNOSIS — N393 Stress incontinence (female) (male): Secondary | ICD-10-CM

## 2017-03-19 LAB — URINALYSIS, COMPLETE (UACMP) WITH MICROSCOPIC
BILIRUBIN URINE: NEGATIVE
GLUCOSE, UA: NEGATIVE mg/dL
HGB URINE DIPSTICK: NEGATIVE
Ketones, ur: NEGATIVE mg/dL
NITRITE: NEGATIVE
Protein, ur: NEGATIVE mg/dL
SPECIFIC GRAVITY, URINE: 1.015 (ref 1.005–1.030)
pH: 5.5 (ref 5.0–8.0)

## 2017-03-19 NOTE — Progress Notes (Signed)
03/19/2017 2:03 PM   Jodi Kirby 17-Apr-1946 [id number]  Referring provider: Alba CorySowles, Krichna, MD [address] [place], [place] [address]  Chief Complaint  Patient presents with  . New Patient (Initial Visit)    bladder polyps [facility]    HPI: 71 yo F with history of "bladder polyps" managed by Dr. Orson SlickHarmon who presents today to establish care following a recent episode of gross hematuria.  Most [facility], [facility] with gross hematuria and symptoms of urinary tract infection. Her UA on 02/09/2017 was positive for nitrites, leukocyte Estrace, and blood.  Urine culture grew Escherichia coli, 50-100k CFU resistant to ampicillin, gentamicin, Zosyn, tetracycline, and Bactrim.  She reports that she has had the same pain that she had in the past with there bladder polyps.  She is concerned that they have recurred. She was previously followed by Dr. Orson SlickHarmon for this. She denies a personal history of bladder cancer.    She gets up 3-4x nightly for the past few months.  No day time frequency.  She drinks water primarily.  She does sleep with with CPAP for OSA.    She does does stress urinary incontinience.  1 ppd for safety.   PMH: Past Medical History:  Diagnosis Date  . Allergic rhinitis, cause unspecified   . Coronary artery disease   . Dysmetabolic syndrome X   . Dysuria   . Esophageal reflux   . Essential hypertension, benign   . Hyperlipidemia   . Lumbago   . Lump or mass in breast   . Myalgia and myositis, unspecified   . Obstructive sleep apnea   . Osteoarthrosis, unspecified whether generalized or localized, lower leg   . Other abnormal glucose   . Other and unspecified angina pectoris   . 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 Toxic effect of venom(989.5)   . Unspecified iridocyclitis   . Unspecified vitamin D deficiency     Surgical  History: Past Surgical History:  Procedure Laterality Date  . ABDOMINAL HYSTERECTOMY  1970  . ANKLE SURGERY Right 1980  . BLADDER SURGERY  2012  . CARDIAC CATHETERIZATION  2003   [place]  . CARDIAC CATHETERIZATION     [place]  . COLONOSCOPY  2008, 2015   Dr. Servando SnareWohl  . CYSTOSTOMY    . KNEE SURGERY  08/18/2011   arthroscopic right  . LUMBAR FUSION     L4-5, S-1  . NECK SURGERY  2003  . TOTAL KNEE ARTHROPLASTY Right 04/29/2015   Procedure: TOTAL KNEE ARTHROPLASTY;  Surgeon: Erin SonsHarold [facility], MD;  Location: [facility] ORS;  Service: Orthopedics;  Laterality: Right;  . TUBAL LIGATION      Home Medications:  Allergies as of 03/19/2017      Reactions   Niaspan [niacin Er] Other (See Comments)   Patient states she can't remember the reaction because it so long ago.   Oxycodone-acetaminophen Nausea Only   Statins Other (See Comments)   Muscle and joint pain.      Medication List       Accurate as of 03/19/17 11:59 PM. Always use your most recent med list.          alendronate 70 MG tablet Commonly known as:  FOSAMAX TAKE 1 TABLET WEEKLY   ALPRAZolam 0.5 MG tablet Commonly known as:  XANAX Take 1 tablet (0.5 mg total) by mouth at bedtime as needed for anxiety.   Amlodipine-Valsartan-HCTZ  5-160-25 MG Tabs Take 1 tablet by mouth daily.   aspirin EC 81 MG tablet Take 81 mg by mouth every morning.   CENTRUM SILVER PO Take 1 tablet by mouth every morning.   ciprofloxacin 500 MG tablet Commonly known as:  CIPRO Take by mouth.   EPIPEN 2-PAK 0.3 mg/0.3 mL Devi Generic drug:  EPINEPHrine Inject 0.3 mg into the muscle as needed.   fluticasone 50 MCG/ACT nasal spray Commonly known as:  FLONASE USE TWO SPRAY(S) IN EACH NOSTRIL AT BEDTIME   loratadine 10 MG tablet Commonly known as:  CLARITIN Take 1 tablet (10 mg total) by mouth daily.   meloxicam 7.5 MG tablet Commonly known as:  MOBIC TAKE 1 TABLET BY MOUTH ONCE DAILY   nitroGLYCERIN 0.4 MG SL tablet Commonly known  as:  NITROSTAT Place 1 tablet (0.4 mg total) under the tongue every 5 (five) minutes as needed for chest pain.   omeprazole 40 MG capsule Commonly known as:  PRILOSEC Take 1 capsule (40 mg total) by mouth daily.   Vitamin D3 1000 units Caps Take 1,000 Units by mouth every morning.       Allergies:  Allergies  Allergen Reactions  . Niaspan [Niacin Er] Other (See Comments)    Patient states she can't remember the reaction because it so long ago.  . Oxycodone-Acetaminophen Nausea Only  . Statins Other (See Comments)    Muscle and joint pain.    Family History: Family History  Problem Relation Age of Onset  . CAD Father   . Kidney cancer Neg Hx   . Bladder Cancer Neg Hx   . Kidney disease Neg Hx     Social History:  reports that she has never smoked. She has never used smokeless tobacco. She reports that she does not drink alcohol or use drugs.  ROS: UROLOGY Frequent Urination?: Yes Hard to postpone urination?: No Burning/pain with urination?: No Get up at night to urinate?: Yes Leakage of urine?: Yes Urine stream starts and stops?: No Trouble starting stream?: No Do you have to strain to urinate?: No Blood in urine?: Yes Urinary tract infection?: No Sexually transmitted disease?: No Injury to kidneys or bladder?: No Painful intercourse?: No Weak stream?: No Currently pregnant?: No Vaginal bleeding?: No Last menstrual period?: n  Gastrointestinal Nausea?: No Vomiting?: No Indigestion/heartburn?: Yes Diarrhea?: No Constipation?: No  Constitutional Fever: No Night sweats?: No Weight loss?: No Fatigue?: No  Skin Skin rash/lesions?: No Itching?: No  Eyes Blurred vision?: No Double vision?: No  Ears/Nose/Throat Sore throat?: No Sinus problems?: No  Hematologic/Lymphatic Swollen glands?: No Easy bruising?: No  Cardiovascular Leg swelling?: No Chest pain?: No  Respiratory Cough?: No Shortness of breath?: No  Endocrine Excessive thirst?:  No  Musculoskeletal Back pain?: Yes Joint pain?: Yes  Neurological Headaches?: No Dizziness?: No  Psychologic Depression?: No Anxiety?: No  Physical Exam: BP (!) 157/71   Pulse (!) 103   Ht 5\' 2"  (1.575 m)   Wt 181 lb (82.1 kg)   BMI 33.11 kg/m   Constitutional:  Alert and oriented, No acute distress. HEENT: [place] AT, moist mucus membranes.  Trachea midline, no masses. Cardiovascular: No clubbing, cyanosis, or edema. Respiratory: Normal respiratory effort, no increased work of breathing. GI: Abdomen is soft, nontender, nondistended, no abdominal masses. Obese.   GU: No CVA tenderness.  Skin: No rashes, bruises or suspicious lesions. Lymph: No cervical or inguinal adenopathy. Neurologic: Grossly intact, no focal deficits, moving all 4 extremities. Psychiatric: Normal mood and affect.  Laboratory Data: Lab Results  Component Value Date   WBC 5.8 11/16/2016   HGB 11.6 (L) 11/16/2016   HCT 35.2 11/16/2016   MCV 84.8 11/16/2016   PLT 264 11/16/2016    Lab Results  Component Value Date   CREATININE 1.03 (H) 11/16/2016     Lab Results  Component Value Date   HGBA1C 5.5 11/08/2015    Urinalysis  Pertinent Imaging: n/a  Assessment & Plan:    1. Gross hematuria Likely related to underlying infection, but given history of "bladder polyps, recommend cystoscopy for further evaluation.  She is agreeable to this plan.  2. Nocturia New onset nocturia, possibly exacerbated by recent urinary tract infection Discussed behavioral modification  3.  Stress urinary incontinence Mild with minimal bother Advised Kegel exercises and may go modification including weight loss   Return in about 2 weeks (around 04/02/2017) for cysto.  Vanna Scotland, MD  [facility] [address] [place], [place] [address] [phone]

## 2017-03-21 LAB — URINE CULTURE: Culture: 50000 — AB

## 2017-04-08 ENCOUNTER — Other Ambulatory Visit: Payer: Self-pay

## 2017-04-08 DIAGNOSIS — R310 Gross hematuria: Secondary | ICD-10-CM

## 2017-04-09 ENCOUNTER — Ambulatory Visit (INDEPENDENT_AMBULATORY_CARE_PROVIDER_SITE_OTHER): Payer: Medicare PPO | Admitting: Urology

## 2017-04-09 ENCOUNTER — Encounter: Payer: Self-pay | Admitting: Urology

## 2017-04-09 ENCOUNTER — Other Ambulatory Visit
Admission: RE | Admit: 2017-04-09 | Discharge: 2017-04-09 | Disposition: A | Discharge status: Home / Self Care | Payer: Medicare PPO | Source: Ambulatory Visit | Attending: Urology | Admitting: Urology

## 2017-04-09 VITALS — BP 136/68 | HR 72 | Ht 62.0 in | Wt 174.0 lb

## 2017-04-09 DIAGNOSIS — N362 Urethral caruncle: Secondary | ICD-10-CM

## 2017-04-09 DIAGNOSIS — R310 Gross hematuria: Secondary | ICD-10-CM | POA: Diagnosis not present

## 2017-04-09 LAB — URINALYSIS, COMPLETE (UACMP) WITH MICROSCOPIC
Bilirubin Urine: NEGATIVE
GLUCOSE, UA: NEGATIVE mg/dL
Hgb urine dipstick: NEGATIVE
KETONES UR: NEGATIVE mg/dL
LEUKOCYTES UA: NEGATIVE
Nitrite: NEGATIVE
Protein, ur: NEGATIVE mg/dL
Specific Gravity, Urine: 1.02 (ref 1.005–1.030)
pH: 7 (ref 5.0–8.0)

## 2017-04-09 MED ORDER — CIPROFLOXACIN HCL 500 MG PO TABS
500.0000 mg | ORAL_TABLET | Freq: Once | ORAL | Status: AC
  Administered 2017-04-09: 500 mg via ORAL

## 2017-04-09 MED ORDER — LIDOCAINE HCL 2 % EX GEL
1.0000 "application " | Freq: Once | CUTANEOUS | Status: AC
  Administered 2017-04-09: 1 via URETHRAL

## 2017-04-09 NOTE — Progress Notes (Signed)
   04/09/17  CC:  Chief Complaint  Patient presents with  . Cysto    HPI: 71 year old female who presents today for cystoscopy.  She has a reported history of "bladder polyps" managed by Dr. Orson SlickHarmon and thinks that these have recurred. She had an episode of gross hematuria associated with an infection.  Blood pressure 136/68, pulse 72, height 5\' 2"  (1.575 m), weight 174 lb (78.9 kg). NED. A&Ox3.   No respiratory distress   Abd soft, NT, ND Normal external genitalia with patent urethral meatus  Cystoscopy Procedure Note  Patient identification was confirmed, informed consent was obtained, and patient was prepped using Betadine solution.  Lidocaine jelly was administered per urethral meatus.    Preoperative abx where received prior to procedure.    Procedure: - Flexible cystoscope introduced, without any difficulty.   - Thorough search of the bladder revealed:    normal urethral meatus with small urethral caruncle at the 6:00 position    normal urothelium    no stones    no ulcers     no tumors    no urethral polyps    no trabeculation  - Ureteral orifices were normal in position and appearance.  Post-Procedure: - Patient tolerated the procedure well  Assessment/ Plan:  1) Gross hematuria- Likely related to urinary tract infection, resolved   Cystoscopy today unremarkable, no evidence of bladder tumors or "polyps" Patient is still concerned that there is something going on, discussed proceeding further workup in the form of CT urogram which is reasonable given the degree of hematuria  2) urethral caruncle- Incidental, asymptomatic  Return in about 4 weeks (around 05/07/2017) for f/u CT urogram.   Vanna ScotlandAshley Mykah Bellomo, MD

## 2017-04-29 ENCOUNTER — Other Ambulatory Visit: Payer: Self-pay

## 2017-04-29 DIAGNOSIS — R310 Gross hematuria: Secondary | ICD-10-CM

## 2017-04-30 ENCOUNTER — Other Ambulatory Visit
Admission: RE | Admit: 2017-04-30 | Discharge: 2017-04-30 | Disposition: A | Discharge status: Home / Self Care | Payer: Medicare PPO | Source: Ambulatory Visit | Attending: Urology | Admitting: Urology

## 2017-04-30 ENCOUNTER — Ambulatory Visit
Admission: RE | Admit: 2017-04-30 | Discharge: 2017-04-30 | Disposition: A | Discharge status: Home / Self Care | Payer: Medicare PPO | Source: Ambulatory Visit | Attending: Urology | Admitting: Urology

## 2017-04-30 DIAGNOSIS — R310 Gross hematuria: Secondary | ICD-10-CM

## 2017-04-30 DIAGNOSIS — K5730 Diverticulosis of large intestine without perforation or abscess without bleeding: Secondary | ICD-10-CM | POA: Insufficient documentation

## 2017-04-30 LAB — CREATININE, SERUM
Creatinine, Ser: 1 mg/dL (ref 0.44–1.00)
GFR calc non Af Amer: 55 mL/min — ABNORMAL LOW (ref >60)

## 2017-04-30 LAB — BUN: BUN: 14 mg/dL (ref 6–20)

## 2017-04-30 MED ORDER — IOPAMIDOL (ISOVUE-300) INJECTION 61%
125.0000 mL | Freq: Once | INTRAVENOUS | Status: AC | PRN
  Administered 2017-04-30: 125 mL via INTRAVENOUS

## 2017-05-03 ENCOUNTER — Telehealth: Payer: Self-pay

## 2017-05-03 NOTE — Telephone Encounter (Signed)
-----   Message from Vanna ScotlandAshley Brandon, MD sent at 04/30/2017  2:56 PM EDT ----- CT looks good.  We will go over in detail at your cystoscopy visit.    Vanna ScotlandAshley Brandon, MD

## 2017-05-03 NOTE — Telephone Encounter (Signed)
Called pt. No answer. No DPR on file. Will try later.  

## 2017-05-07 ENCOUNTER — Telehealth: Payer: Self-pay | Admitting: Family Medicine

## 2017-05-07 NOTE — Telephone Encounter (Signed)
Patient notified

## 2017-05-07 NOTE — Telephone Encounter (Signed)
-----   Message from Ashley Brandon, MD sent at 04/30/2017  2:56 PM EDT ----- CT looks good.  We will go over in detail at your cystoscopy visit.    Ashley Brandon, MD  

## 2017-05-13 ENCOUNTER — Other Ambulatory Visit: Payer: Self-pay

## 2017-05-13 DIAGNOSIS — R310 Gross hematuria: Secondary | ICD-10-CM

## 2017-05-14 ENCOUNTER — Other Ambulatory Visit
Admission: RE | Admit: 2017-05-14 | Discharge: 2017-05-14 | Disposition: A | Discharge status: Home / Self Care | Payer: Medicare PPO | Source: Ambulatory Visit | Attending: Urology | Admitting: Urology

## 2017-05-14 ENCOUNTER — Ambulatory Visit (INDEPENDENT_AMBULATORY_CARE_PROVIDER_SITE_OTHER): Payer: Medicare PPO | Admitting: Urology

## 2017-05-14 ENCOUNTER — Encounter: Payer: Self-pay | Admitting: Urology

## 2017-05-14 VITALS — BP 140/59 | HR 65 | Ht 63.0 in | Wt 180.0 lb

## 2017-05-14 DIAGNOSIS — N393 Stress incontinence (female) (male): Secondary | ICD-10-CM

## 2017-05-14 DIAGNOSIS — R351 Nocturia: Secondary | ICD-10-CM

## 2017-05-14 DIAGNOSIS — R310 Gross hematuria: Secondary | ICD-10-CM | POA: Diagnosis not present

## 2017-05-14 LAB — URINALYSIS, COMPLETE (UACMP) WITH MICROSCOPIC
Bacteria, UA: NONE SEEN
Bilirubin Urine: NEGATIVE
Glucose, UA: NEGATIVE mg/dL
Hgb urine dipstick: NEGATIVE
Ketones, ur: NEGATIVE mg/dL
Leukocytes, UA: NEGATIVE
Nitrite: NEGATIVE
Protein, ur: NEGATIVE mg/dL
Specific Gravity, Urine: 1.015 (ref 1.005–1.030)
pH: 6 (ref 5.0–8.0)

## 2017-05-15 ENCOUNTER — Encounter: Payer: Self-pay | Admitting: Family Medicine

## 2017-05-15 DIAGNOSIS — I700 Atherosclerosis of aorta: Secondary | ICD-10-CM | POA: Insufficient documentation

## 2017-05-15 NOTE — Progress Notes (Signed)
05/14/2017 2:07 PM   Jodi Kirby 09/29/1946 [id number]  Referring provider: Alba CorySowles, Krichna, MD [address] [place], [place] [id number]  Chief Complaint  Patient presents with  . Hematuria    Ctscan results    HPI: 71 yo F with history of "bladder polyps" and gross hematuria who returns Today for follow-up CT urogram. Gross hematuria likely associated with recent urinary tract infection. No personal history of bladder cancer.  CT urogram on 04/30/17 unremarkable.  No GU pathology.    She underwent office cystoscopy which was unremarkable. She does have a small urethral caruncle.    She gets up 3-4x nightly for the past few months.  No day time frequency.  She drinks water primarily.  She does sleep with with CPAP for OSA.    This is quite bothersome to her. She notes that when she does get up and urinate at night comments very little volume.    She does have mild stress urinary incontinience.  1 ppd for safety.  No urge incontinence or nocturnal enuresis.   PMH: Past Medical History:  Diagnosis Date  . Allergic rhinitis, cause unspecified   . Coronary artery disease   . Dysmetabolic syndrome X   . Dysuria   . Esophageal reflux   . Essential hypertension, benign   . Hyperlipidemia   . Lumbago   . Lump or mass in breast   . Myalgia and myositis, unspecified   . Obstructive sleep apnea   . Osteoarthrosis, unspecified whether generalized or localized, lower leg   . Other abnormal glucose   . Other and unspecified angina pectoris   . 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 Toxic effect of venom(989.5)   . Unspecified iridocyclitis   . Unspecified vitamin D deficiency     Surgical History: Past Surgical History:  Procedure Laterality Date  . ABDOMINAL HYSTERECTOMY  1970  . ANKLE SURGERY Right 1980  . BLADDER SURGERY  2012  . CARDIAC CATHETERIZATION  2003   [facility]  . CARDIAC CATHETERIZATION      [facility]  . COLONOSCOPY  2008, 2015   Dr. Servando SnareWohl  . CYSTOSTOMY    . KNEE SURGERY  08/18/2011   arthroscopic right  . LUMBAR FUSION     L4-5, S-1  . NECK SURGERY  2003  . TOTAL KNEE ARTHROPLASTY Right 04/29/2015   Procedure: TOTAL KNEE ARTHROPLASTY;  Surgeon: Erin SonsHarold Kernodle, MD;  Location: [facility] ORS;  Service: Orthopedics;  Laterality: Right;  . TUBAL LIGATION      Home Medications:  Allergies as of 05/14/2017      Reactions   Niaspan [niacin Er] Other (See Comments)   Patient states she can't remember the reaction because it so long ago.   Oxycodone-acetaminophen Nausea Only   Statins Other (See Comments)   Muscle and joint pain.      Medication List       Accurate as of 05/14/17 11:59 PM. Always use your most recent med list.          alendronate 70 MG tablet Commonly known as:  FOSAMAX TAKE 1 TABLET WEEKLY   ALPRAZolam 0.5 MG tablet Commonly known as:  XANAX Take 1 tablet (0.5 mg total) by mouth at bedtime as needed for anxiety.   Amlodipine-Valsartan-HCTZ 5-160-25 MG Tabs Take 1 tablet by mouth daily.   aspirin EC 81 MG tablet Take 81 mg by mouth every morning.   CENTRUM SILVER PO Take 1 tablet  by mouth every morning.   EPIPEN 2-PAK 0.3 mg/0.3 mL Devi Generic drug:  EPINEPHrine Inject 0.3 mg into the muscle as needed.   fluticasone 50 MCG/ACT nasal spray Commonly known as:  FLONASE USE TWO SPRAY(S) IN EACH NOSTRIL AT BEDTIME   loratadine 10 MG tablet Commonly known as:  CLARITIN Take 1 tablet (10 mg total) by mouth daily.   meloxicam 7.5 MG tablet Commonly known as:  MOBIC TAKE 1 TABLET BY MOUTH ONCE DAILY   nitroGLYCERIN 0.4 MG SL tablet Commonly known as:  NITROSTAT Place 1 tablet (0.4 mg total) under the tongue every 5 (five) minutes as needed for chest pain.   omeprazole 40 MG capsule Commonly known as:  PRILOSEC Take 1 capsule (40 mg total) by mouth daily.   Vitamin D3 1000 units Caps Take 1,000 Units by mouth every morning.        Allergies:  Allergies  Allergen Reactions  . Niaspan [Niacin Er] Other (See Comments)    Patient states she can't remember the reaction because it so long ago.  . Oxycodone-Acetaminophen Nausea Only  . Statins Other (See Comments)    Muscle and joint pain.    Family History: Family History  Problem Relation Age of Onset  . CAD Father   . Kidney cancer Neg Hx   . Bladder Cancer Neg Hx   . Kidney disease Neg Hx     Social History:  reports that she has never smoked. She has never used smokeless tobacco. She reports that she does not drink alcohol or use drugs.  ROS: UROLOGY Frequent Urination?: No Hard to postpone urination?: No Burning/pain with urination?: No Get up at night to urinate?: Yes Leakage of urine?: No Urine stream starts and stops?: No Trouble starting stream?: No Do you have to strain to urinate?: No Blood in urine?: No Urinary tract infection?: No Sexually transmitted disease?: No Injury to kidneys or bladder?: No Painful intercourse?: No Weak stream?: No Currently pregnant?: No Vaginal bleeding?: No Last menstrual period?: n  Gastrointestinal Nausea?: No Vomiting?: No Indigestion/heartburn?: No Diarrhea?: No Constipation?: No  Constitutional Fever: No Night sweats?: No Weight loss?: No Fatigue?: No  Skin Skin rash/lesions?: No Itching?: No  Eyes Blurred vision?: No Double vision?: No  Ears/Nose/Throat Sore throat?: No Sinus problems?: No  Hematologic/Lymphatic Swollen glands?: No Easy bruising?: No  Cardiovascular Leg swelling?: No Chest pain?: No  Respiratory Cough?: No Shortness of breath?: No  Endocrine Excessive thirst?: No  Musculoskeletal Back pain?: No Joint pain?: No  Neurological Headaches?: No Dizziness?: No  Psychologic Depression?: No Anxiety?: No  Physical Exam: BP (!) 140/59   Pulse 65   Ht 5\' 3"  (1.6 m)   Wt 180 lb (81.6 kg)   BMI 31.89 kg/m   Constitutional:  Alert and oriented, No  acute distress. HEENT: [place] AT, moist mucus membranes.  Trachea midline, no masses. Cardiovascular: No clubbing, cyanosis, or edema. Respiratory: Normal respiratory effort, no increased work of breathing. GI: Abdomen is soft, nontender, nondistended, no abdominal masses. Obese.   Neurologic: Grossly intact, no focal deficits, moving all 4 extremities. Psychiatric: Normal mood and affect.  Laboratory Data: Lab Results  Component Value Date   WBC 5.8 11/16/2016   HGB 11.6 (L) 11/16/2016   HCT 35.2 11/16/2016   MCV 84.8 11/16/2016   PLT 264 11/16/2016    Lab Results  Component Value Date   CREATININE 1.00 04/30/2017     Lab Results  Component Value Date   HGBA1C 5.5 11/08/2015  Urinalysis n/a  Pertinent Imaging: CLINICAL DATA:  Episode of gross hematuria approximately 2 months ago.  EXAM: CT ABDOMEN AND PELVIS WITHOUT AND WITH CONTRAST  TECHNIQUE: Multidetector CT imaging of the abdomen and pelvis was performed following the standard protocol before and following the bolus administration of intravenous contrast.  CONTRAST:  ISOVUE-300 IOPAMIDOL (ISOVUE-300) INJECTION 61%  COMPARISON:  None.  FINDINGS: Lower chest: The lung bases are clear of acute process. No pleural effusion or pulmonary lesions. The heart is normal in size. No pericardial effusion. The distal esophagus and aorta are unremarkable.  Hepatobiliary: No focal hepatic lesions or intrahepatic biliary dilatation. The gallbladder is normal. No common bile duct dilatation.  Pancreas: No mass, inflammation or ductal dilatation.  Spleen:  Normal size.  No focal lesions.  Adrenals/Urinary Tract: The adrenal glands and kidneys are unremarkable. No renal, ureteral or bladder calculi or mass. No findings to account for the patient's episode of gross hematuria. Knee pain delayed images do not demonstrate any significant collecting system abnormalities and both ureters are normal.  No asymmetric bladder wall thickening or cyst.  Stomach/Bowel: The stomach, duodenum, small bowel and colon are grossly normal without oral contrast. No inflammatory changes, mass lesions or obstructive findings. The terminal ileum and appendix are normal. Advanced sigmoid diverticulosis without findings for acute diverticulitis.  Vascular/Lymphatic: The aorta is normal in caliber. No dissection. Scattered atherosclerotic calcifications. The branch vessels are patent. The major venous structures are patent. No mesenteric or retroperitoneal mass or adenopathy. Small scattered lymph nodes are noted.  Reproductive: Surgically absent.  Other: No pelvic mass or adenopathy. No free pelvic fluid collections. No inguinal mass or adenopathy. No abdominal wall hernia or subcutaneous lesions.  Musculoskeletal: Surgical changes involving the lumbar spine with L4-5 fusion hardware. No complicating features. No acute bony findings or worrisome bone lesions.  IMPRESSION: 1. No CT findings to account for the patient's hematuria. No renal, ureteral or bladder calculi or mass. 2. No acute abdominal/pelvic findings, mass lesions or lymphadenopathy. 3. Sigmoid diverticulosis without findings for acute diverticulitis. 4. Lumbar fusion hardware without complicating features.   Electronically Signed   By: Rudie Meyer M.D.   On: 04/30/2017 13:53  CT scan personally reviewed today and with the patient.  Assessment & Plan:    1. Gross hematuria Status post negative workup including cystoscopy and CT urogram Likely related to recent urinary tract infection  2. Nocturia Reviewed behavioral modification, already avoids liquids past 5 PM Given samples of Mybetriq 25 mg 2 weeks to see if treating overactivity helps with nocturia, she'll call and let us know if this is effective Compliant with CPAP but stressed importance in relationship to nocturia  3.  Stress urinary  incontinence Mild with minimal bother Advised Kegel exercises and may go modification including weight loss  Patient will call us to let us know if Mybetriq samples were effective. We'll devise follow up plan based on phone call.  Vanna Scotland, MD  [facility] [address] [place], [place] [address] [phone]

## 2017-05-17 ENCOUNTER — Encounter: Payer: Self-pay | Admitting: Family Medicine

## 2017-05-17 ENCOUNTER — Ambulatory Visit (INDEPENDENT_AMBULATORY_CARE_PROVIDER_SITE_OTHER): Payer: Medicare PPO | Admitting: Family Medicine

## 2017-05-17 VITALS — BP 138/74 | HR 86 | Temp 98.2°F | Resp 16 | Ht 62.0 in | Wt 181.0 lb

## 2017-05-17 DIAGNOSIS — G4700 Insomnia, unspecified: Secondary | ICD-10-CM

## 2017-05-17 DIAGNOSIS — I209 Angina pectoris, unspecified: Secondary | ICD-10-CM

## 2017-05-17 DIAGNOSIS — I10 Essential (primary) hypertension: Secondary | ICD-10-CM

## 2017-05-17 DIAGNOSIS — R739 Hyperglycemia, unspecified: Secondary | ICD-10-CM | POA: Diagnosis not present

## 2017-05-17 DIAGNOSIS — M75102 Unspecified rotator cuff tear or rupture of left shoulder, not specified as traumatic: Secondary | ICD-10-CM

## 2017-05-17 DIAGNOSIS — E782 Mixed hyperlipidemia: Secondary | ICD-10-CM | POA: Diagnosis not present

## 2017-05-17 DIAGNOSIS — K219 Gastro-esophageal reflux disease without esophagitis: Secondary | ICD-10-CM

## 2017-05-17 DIAGNOSIS — M8580 Other specified disorders of bone density and structure, unspecified site: Secondary | ICD-10-CM

## 2017-05-17 DIAGNOSIS — D638 Anemia in other chronic diseases classified elsewhere: Secondary | ICD-10-CM

## 2017-05-17 MED ORDER — ALENDRONATE SODIUM 70 MG PO TABS: ORAL_TABLET | ORAL | 3 refills | Status: DC

## 2017-05-17 MED ORDER — TRAZODONE HCL 50 MG PO TABS: 50.0000 mg | ORAL_TABLET | Freq: Every evening | ORAL | 0 refills | Status: DC | PRN

## 2017-05-17 NOTE — Progress Notes (Signed)
Name: Jodi Kirby   MRN: [id number]    DOB: 11-04-1945   Date:05/17/2017       Progress Note  Subjective  Chief Complaint  Chief Complaint  Patient presents with  . Medication Refill    4 month F/U  . Gastroesophageal Reflux    Well controlled  . Hypertension    Denies any symptoms  . Allergic Rhinitis     Taking as needed  . Urinary Frequency    Seen Urologist and started Myrbetriq medication with a sample to see how patient does with it.    HPI  OSA: using CPAP every night, she keeps it on for about 7 hours per night. She states some nights she is unable to sleep and takes Alprazolam prn, however discussed Beer's list and she is willing to try Trazodone  Knee replacement surgery right: surgery was June 27 th, 2016, she has completed her PT and flexion up o 128 extension 180 degrees ( she states it is even better now - flexing more ), she is still doing her exercises at home and is back at the gym three times weekly. She is off Meloxicam , she states occasionally right is knee is locking up, advised her to go back to Dr. Leanor Kail.   HTN: taking half pill of Exforge HCTZ ( discussed changing to dual therapy ) but she prefers to stay like it is. Denies side effects of medication, bp is at goal. She denies any recent episodes of chest pain, SOB or palpitation . BP elevated today, she states had a family reunion yesterday and was very stressed out. Advised to go up on Exforge HcTZ to one pill daily if bp remains above 140/90  Nocturia: seen by Dr. Erlene Quan, started on Myrbetriq last week and has noticed improvement of symptoms She used to have 4 episodes per night and is down to twice per night.   Hyperlipidemia: unable to tolerate statins, even in a low dose of 2.5 mg of Crestor, muscles aches were severe and she stopped medication. Dr. Rockey Situ advised Zetia however she does not want to take another medication at this time  GERD: under control with Omeprazole in am's,  occasionally takes Ranitidine qpm. No heartburn, no regurgitation.   Angina Pectoris: she has CAD, mild disease, seeing Dr. Rockey Situ, went to [facility] 06/2016  - last episode of chest pain, doing well now. Taking aspirin and ARB, not on beta-blocker because of bradycardia and not on statin because of intolerance.  Osteopenia: diagnosed in 2016 - and was started on Fosamax, denies side effects.ran out of medication about one month ago, we will resend prescription to pharmacy today   Left shoulder pain: she states that she has long history of left rotator cuff syndrome, we gave her steroid injection about 6 months ago but continues to have pain and decrease in abduction, she has tried home exercises, advised to follow up with Ortho.   Patient Active Problem List   Diagnosis Date Noted  . Aortic atherosclerosis ([place]) 05/15/2017  . Hyperglycemia 11/17/2016  . Chest pain 06/27/2016  . Osteopenia 05/15/2016  . Insomnia 11/08/2015  . Angina pectoris ([place]) 07/10/2015  . Seasonal allergic rhinitis 07/09/2015  . GERD (gastroesophageal reflux disease) 07/09/2015  . Bee sting allergy 07/09/2015  . History of knee replacement procedure of right knee 04/29/2015  . History of colonic polyps 09/24/2014  . Fibrocystic breast 09/24/2014  . Arthritis of knee, degenerative 06/12/2014  . Hyperlipidemia 02/09/2013  . Coronary artery disease 02/09/2013  .  Obstructive sleep apnea on CPAP 02/09/2013  . Hypertension 02/09/2013    Past Surgical History:  Procedure Laterality Date  . ABDOMINAL HYSTERECTOMY  1970  . ANKLE SURGERY Right 1980  . BLADDER SURGERY  2012  . CARDIAC CATHETERIZATION  2003   [facility]  . CARDIAC CATHETERIZATION     [facility]  . COLONOSCOPY  2008, 2015   Dr. Allen Norris  . CYSTOSTOMY    . KNEE SURGERY  08/18/2011   arthroscopic right  . LUMBAR FUSION     L4-5, S-1  . NECK SURGERY  2003  . TOTAL KNEE ARTHROPLASTY Right 04/29/2015   Procedure: TOTAL KNEE ARTHROPLASTY;  Surgeon: Leanor Kail, MD;  Location: [facility] ORS;  Service: Orthopedics;  Laterality: Right;  . TUBAL LIGATION      Family History  Problem Relation Age of Onset  . CAD Father   . Kidney cancer Neg Hx   . Bladder Cancer Neg Hx   . Kidney disease Neg Hx     Social History   Social History  . Marital status: Married    Spouse name: N/A  . Number of children: N/A  . Years of education: N/A   Occupational History  . Not on file.   Social History Main Topics  . Smoking status: Never Smoker  . Smokeless tobacco: Never Used  . Alcohol use No  . Drug use: No  . Sexual activity: Yes    Partners: Male    Birth control/ protection: None   Other Topics Concern  . Not on file   Social History Narrative  . No narrative on file     Current Outpatient Prescriptions:  .  alendronate (FOSAMAX) 70 MG tablet, TAKE 1 TABLET WEEKLY, Disp: 12 tablet, Rfl: 3 .  Amlodipine-Valsartan-HCTZ 5-160-25 MG TABS, Take 1 tablet by mouth daily. (Patient taking differently: Take 1 tablet by mouth every morning. ), Disp: 90 tablet, Rfl: 1 .  aspirin EC 81 MG tablet, Take 81 mg by mouth every morning., Disp: , Rfl:  .  Cholecalciferol (VITAMIN D3) 1000 UNITS CAPS, Take 1,000 Units by mouth every morning. , Disp: , Rfl:  .  EPINEPHrine (EPIPEN 2-PAK) 0.3 mg/0.3 mL DEVI, Inject 0.3 mg into the muscle as needed. , Disp: , Rfl:  .  fluticasone (FLONASE) 50 MCG/ACT nasal spray, USE TWO SPRAY(S) IN EACH NOSTRIL AT BEDTIME (Patient taking differently: USE TWO SPRAY(S) IN EACH NOSTRIL AT BEDTIME AS NEEDED FOR RHINITIS.), Disp: 48 g, Rfl: 0 .  loratadine (CLARITIN) 10 MG tablet, Take 1 tablet (10 mg total) by mouth daily. (Patient taking differently: Take 10 mg by mouth daily as needed for allergies. ), Disp: 30 tablet, Rfl: 5 .  mirabegron ER (MYRBETRIQ) 25 MG TB24 tablet, Take 25 mg by mouth daily., Disp: , Rfl:  .  Multiple Vitamins-Minerals (CENTRUM SILVER PO), Take 1 tablet by mouth every morning. , Disp: , Rfl:  .   nitroGLYCERIN (NITROSTAT) 0.4 MG SL tablet, Place 1 tablet (0.4 mg total) under the tongue every 5 (five) minutes as needed for chest pain., Disp: 25 tablet, Rfl: 0 .  omeprazole (PRILOSEC) 40 MG capsule, Take 1 capsule (40 mg total) by mouth daily., Disp: 90 capsule, Rfl: 3 .  traZODone (DESYREL) 50 MG tablet, Take 1 tablet (50 mg total) by mouth at bedtime as needed for sleep., Disp: 90 tablet, Rfl: 0  Allergies  Allergen Reactions  . Niaspan [Niacin Er] Other (See Comments)    Patient states she can't remember the reaction because  it so long ago.  . Oxycodone-Acetaminophen Nausea Only  . Statins Other (See Comments)    Muscle and joint pain.     ROS  Constitutional: Negative for fever , positive for weight change.  Respiratory: Negative for cough and shortness of breath.   Cardiovascular: Negative for chest pain or palpitations.  Gastrointestinal: Negative for abdominal pain, no bowel changes.  Musculoskeletal: Negative for gait problem or joint swelling.   Skin: Negative for rash.  Neurological: Negative for dizziness or headache.  No other specific complaints in a complete review of systems (except as listed in HPI above).  Objective  Vitals:   05/17/17 0834 05/17/17 0912  BP: (!) 152/78 138/74  Pulse: 86   Resp: 16   Temp: 98.2 F (36.8 C)   TempSrc: Oral   SpO2: 99%   Weight: 181 lb (82.1 kg)   Height: 5' 2"  (1.575 m)     Body mass index is 33.11 kg/m.  Physical Exam  Constitutional: Patient appears well-developed and well-nourished. Obese  No distress.  HEENT: head atraumatic, normocephalic, pupils equal and reactive to light,  neck supple, throat within normal limits Cardiovascular: Normal rate, regular rhythm and normal heart sounds.  No murmur heard. No BLE edema. Pulmonary/Chest: Effort normal and breath sounds normal. No respiratory distress. Abdominal: Soft.  There is no tenderness. Psychiatric: Patient has a normal mood and affect. behavior is normal.  Judgment and thought content normal. Muscular Skeletal: positive for impingement left shoulder   Recent Results (from the past 2160 hour(s))  Urinalysis, Complete w Microscopic     Status: Abnormal   Collection Time: 03/19/17  1:36 PM  Result Value Ref Range   Color, Urine YELLOW YELLOW   APPearance CLEAR CLEAR   Specific Gravity, Urine 1.015 1.005 - 1.030   pH 5.5 5.0 - 8.0   Glucose, UA NEGATIVE NEGATIVE mg/dL   Hgb urine dipstick NEGATIVE NEGATIVE   Bilirubin Urine NEGATIVE NEGATIVE   Ketones, ur NEGATIVE NEGATIVE mg/dL   Protein, ur NEGATIVE NEGATIVE mg/dL   Nitrite NEGATIVE NEGATIVE   Leukocytes, UA TRACE (A) NEGATIVE   Squamous Epithelial / LPF 6-30 (A) NONE SEEN   WBC, UA 6-30 0 - 5 WBC/hpf   RBC / HPF 0-5 0 - 5 RBC/hpf   Bacteria, UA RARE (A) NONE SEEN   Hyaline Casts, UA PRESENT   Culture, Urine     Status: Abnormal   Collection Time: 03/19/17  1:36 PM  Result Value Ref Range   Specimen Description URINE, CLEAN CATCH    Special Requests NONE    Culture (A)     50,000 COLONIES/mL LACTOBACILLUS SPECIES Standardized susceptibility testing for this organism is not available. Performed at [facility], [place] [address] [place], [place] [address]    Report Status 03/21/2017 FINAL   Urinalysis, Complete w Microscopic     Status: Abnormal   Collection Time: 04/09/17  2:48 PM  Result Value Ref Range   Color, Urine YELLOW YELLOW   APPearance CLEAR CLEAR   Specific Gravity, Urine 1.020 1.005 - 1.030   pH 7.0 5.0 - 8.0   Glucose, UA NEGATIVE NEGATIVE mg/dL   Hgb urine dipstick NEGATIVE NEGATIVE   Bilirubin Urine NEGATIVE NEGATIVE   Ketones, ur NEGATIVE NEGATIVE mg/dL   Protein, ur NEGATIVE NEGATIVE mg/dL   Nitrite NEGATIVE NEGATIVE   Leukocytes, UA NEGATIVE NEGATIVE   Squamous Epithelial / LPF 0-5 (A) NONE SEEN   WBC, UA 0-5 0 - 5 WBC/hpf   RBC /  HPF 0-5 0 - 5 RBC/hpf   Bacteria, UA RARE (A) NONE SEEN  BUN     Status: None   Collection Time: 04/30/17 10:11 AM   Result Value Ref Range   BUN 14 6 - 20 mg/dL  Creatinine, serum     Status: Abnormal   Collection Time: 04/30/17 10:11 AM  Result Value Ref Range   Creatinine, Ser 1.00 0.44 - 1.00 mg/dL   GFR calc non Af Amer 55 (L) >60 mL/min   GFR calc Af Amer >60 >60 mL/min    Comment: (NOTE) The eGFR has been calculated using the CKD EPI equation. This calculation has not been validated in all clinical situations. eGFR's persistently <60 mL/min signify possible Chronic Kidney Disease.   Urinalysis, Complete w Microscopic     Status: Abnormal   Collection Time: 05/14/17  1:04 PM  Result Value Ref Range   Color, Urine STRAW (A) YELLOW   APPearance CLEAR CLEAR   Specific Gravity, Urine 1.015 1.005 - 1.030   pH 6.0 5.0 - 8.0   Glucose, UA NEGATIVE NEGATIVE mg/dL   Hgb urine dipstick NEGATIVE NEGATIVE   Bilirubin Urine NEGATIVE NEGATIVE   Ketones, ur NEGATIVE NEGATIVE mg/dL   Protein, ur NEGATIVE NEGATIVE mg/dL   Nitrite NEGATIVE NEGATIVE   Leukocytes, UA NEGATIVE NEGATIVE   Squamous Epithelial / LPF 0-5 (A) NONE SEEN   WBC, UA 0-5 0 - 5 WBC/hpf   RBC / HPF 0-5 0 - 5 RBC/hpf   Bacteria, UA NONE SEEN NONE SEEN      PHQ2/9: Depression screen [facility] 2/9 05/17/2017 01/25/2017 05/15/2016 01/14/2016 12/09/2015  Decreased Interest 0 0 0 0 0  Down, Depressed, Hopeless 0 0 0 0 0  PHQ - 2 Score 0 0 0 0 0     Fall Risk: Fall Risk  05/17/2017 01/25/2017 05/15/2016 01/14/2016 12/09/2015  Falls in the past year? No No No No No     Functional Status Survey: Is the patient deaf or have difficulty hearing?: No Does the patient have difficulty seeing, even when wearing glasses/contacts?: No Does the patient have difficulty concentrating, remembering, or making decisions?: No Does the patient have difficulty walking or climbing stairs?: No Does the patient have difficulty dressing or bathing?: No Does the patient have difficulty doing errands alone such as visiting a doctor's office or shopping?:  No    Assessment & Plan  1. Essential hypertension  bp is elevated, she was started on Myrbetric, currently taking half pill of bp medication - COMPLETE METABOLIC PANEL WITH GFR  2. Gastroesophageal reflux disease without esophagitis  Continue medication   3. Angina pectoris ([place])  Doing well, takes medication   4. Mixed hyperlipidemia  Recheck labs. She refuses Zetia and statin, sees Dr. Rockey Situ  - Lipid panel  5. Osteopenia, unspecified location  - alendronate (FOSAMAX) 70 MG tablet; TAKE 1 TABLET WEEKLY  Dispense: 12 tablet; Refill: 3 - DG Bone Density; Future   6. Rotator cuff syndrome of left shoulder  Explained again importance of PT, or referral to Ortho, she refuses at this time  7. Insomnia, unspecified type  - traZODone (DESYREL) 50 MG tablet; Take 1 tablet (50 mg total) by mouth at bedtime as needed for sleep.  Dispense: 90 tablet; Refill: 0  8. Anemia of chronic disease  - CBC with Differential/Platelet  9. Hyperglycemia  - Hemoglobin A1c

## 2017-05-21 DIAGNOSIS — E782 Mixed hyperlipidemia: Secondary | ICD-10-CM | POA: Diagnosis not present

## 2017-05-21 DIAGNOSIS — R739 Hyperglycemia, unspecified: Secondary | ICD-10-CM | POA: Diagnosis not present

## 2017-05-21 DIAGNOSIS — D638 Anemia in other chronic diseases classified elsewhere: Secondary | ICD-10-CM | POA: Diagnosis not present

## 2017-05-21 DIAGNOSIS — I10 Essential (primary) hypertension: Secondary | ICD-10-CM | POA: Diagnosis not present

## 2017-05-21 LAB — CBC WITH DIFFERENTIAL/PLATELET
Basophils Absolute: 0 cells/uL (ref 0–200)
Basophils Relative: 0 %
EOS PCT: 1 %
Eosinophils Absolute: 66 cells/uL (ref 15–500)
HCT: 34.6 % — ABNORMAL LOW (ref 35.0–45.0)
HEMOGLOBIN: 11 g/dL — AB (ref 11.7–15.5)
LYMPHS ABS: 1650 {cells}/uL (ref 850–3900)
Lymphocytes Relative: 25 %
MCH: 27.9 pg (ref 27.0–33.0)
MCHC: 31.8 g/dL — AB (ref 32.0–36.0)
MCV: 87.8 fL (ref 80.0–100.0)
MPV: 9.8 fL (ref 7.5–12.5)
Monocytes Absolute: 462 cells/uL (ref 200–950)
Monocytes Relative: 7 %
NEUTROS ABS: 4422 {cells}/uL (ref 1500–7800)
NEUTROS PCT: 67 %
PLATELETS: 258 10*3/uL (ref 140–400)
RBC: 3.94 MIL/uL (ref 3.80–5.10)
RDW: 13.1 % (ref 11.0–15.0)
WBC: 6.6 10*3/uL (ref 3.8–10.8)

## 2017-05-22 LAB — COMPLETE METABOLIC PANEL WITH GFR
ALBUMIN: 4 g/dL (ref 3.6–5.1)
ALK PHOS: 77 U/L (ref 33–130)
ALT: 16 U/L (ref 6–29)
AST: 14 U/L (ref 10–35)
BUN: 17 mg/dL (ref 7–25)
CALCIUM: 9.4 mg/dL (ref 8.6–10.4)
CO2: 28 mmol/L (ref 20–31)
CREATININE: 1.14 mg/dL — AB (ref 0.60–0.93)
Chloride: 103 mmol/L (ref 98–110)
GFR, Est African American: 56 mL/min — ABNORMAL LOW (ref >=60)
GFR, Est Non African American: 48 mL/min — ABNORMAL LOW (ref >=60)
GLUCOSE: 96 mg/dL (ref 65–99)
Potassium: 4.2 mmol/L (ref 3.5–5.3)
Sodium: 141 mmol/L (ref 135–146)
TOTAL PROTEIN: 6.4 g/dL (ref 6.1–8.1)
Total Bilirubin: 0.3 mg/dL (ref 0.2–1.2)

## 2017-05-22 LAB — LIPID PANEL
Cholesterol: 198 mg/dL (ref <200)
HDL: 42 mg/dL — ABNORMAL LOW (ref >50)
LDL CALC: 118 mg/dL — AB (ref <100)
Total CHOL/HDL Ratio: 4.7 Ratio (ref <5.0)
Triglycerides: 192 mg/dL — ABNORMAL HIGH (ref <150)
VLDL: 38 mg/dL — AB (ref <30)

## 2017-05-22 LAB — HEMOGLOBIN A1C
HEMOGLOBIN A1C: 5.4 % (ref <5.7)
Mean Plasma Glucose: 108 mg/dL

## 2017-06-08 ENCOUNTER — Ambulatory Visit
Admission: RE | Admit: 2017-06-08 | Discharge: 2017-06-08 | Disposition: A | Discharge status: Home / Self Care | Payer: Medicare PPO | Source: Ambulatory Visit | Attending: Family Medicine | Admitting: Family Medicine

## 2017-06-08 DIAGNOSIS — M8588 Other specified disorders of bone density and structure, other site: Secondary | ICD-10-CM | POA: Insufficient documentation

## 2017-06-08 DIAGNOSIS — M8580 Other specified disorders of bone density and structure, unspecified site: Secondary | ICD-10-CM

## 2017-06-08 DIAGNOSIS — M8589 Other specified disorders of bone density and structure, multiple sites: Secondary | ICD-10-CM | POA: Diagnosis not present

## 2017-06-08 DIAGNOSIS — Z780 Asymptomatic menopausal state: Secondary | ICD-10-CM | POA: Diagnosis not present

## 2017-06-10 ENCOUNTER — Other Ambulatory Visit: Payer: Self-pay | Admitting: Family Medicine

## 2017-06-10 DIAGNOSIS — I10 Essential (primary) hypertension: Secondary | ICD-10-CM

## 2017-06-10 NOTE — Telephone Encounter (Signed)
Patient requesting refill of Amlodipine-Valsartan-HCTZ to [facility].

## 2017-07-07 ENCOUNTER — Encounter: Payer: Self-pay | Admitting: Family Medicine

## 2017-07-07 ENCOUNTER — Ambulatory Visit (INDEPENDENT_AMBULATORY_CARE_PROVIDER_SITE_OTHER): Payer: Medicare PPO | Admitting: Family Medicine

## 2017-07-07 VITALS — BP 136/68 | HR 79 | Temp 98.0°F | Resp 16 | Ht 62.0 in | Wt 181.2 lb

## 2017-07-07 DIAGNOSIS — Z23 Encounter for immunization: Secondary | ICD-10-CM | POA: Diagnosis not present

## 2017-07-07 DIAGNOSIS — R42 Dizziness and giddiness: Secondary | ICD-10-CM | POA: Diagnosis not present

## 2017-07-07 NOTE — Progress Notes (Signed)
Name: Jodi Kirby   MRN: [id number]    DOB: [birth year]   Date:07/07/2017       Progress Note  Subjective  Chief Complaint  Chief Complaint  Patient presents with  . Dizziness    Patient has been very dizzy, nauseous, feels like the room is spinning and unable to get up quick or will feel like she is gonna fall. She has seen an ENT doctor in the past for Vertigo where they moved her head around and it helped. Her symptoms have been going on for 2 weeks with no relief and unable to get into the ear, nose and throat doctor anytime soon.    HPI  Vertigo: she has a history of BPPV, had Epley maneuver done last year and symptoms resolved, she is due for yearly check in a few weeks, however two weeks ago started to have spinning sensation and lack of balance, sometimes associated with nausea, but no vomiting. She denies tinnitus or hearing loss. She called ENT but could not move appointment to a sooner date.    Patient Active Problem List   Diagnosis Date Noted  . Aortic atherosclerosis ([place]) 05/15/2017  . Hyperglycemia 11/17/2016  . Chest pain 06/27/2016  . Osteopenia 05/15/2016  . Insomnia 11/08/2015  . Angina pectoris ([place]) 07/10/2015  . Seasonal allergic rhinitis 07/09/2015  . GERD (gastroesophageal reflux disease) 07/09/2015  . Bee sting allergy 07/09/2015  . History of knee replacement procedure of right knee 04/29/2015  . History of colonic polyps 09/24/2014  . Fibrocystic breast 09/24/2014  . Arthritis of knee, degenerative 06/12/2014  . Hyperlipidemia 02/09/2013  . Coronary artery disease 02/09/2013  . Obstructive sleep apnea on CPAP 02/09/2013  . Hypertension 02/09/2013    Past Surgical History:  Procedure Laterality Date  . ABDOMINAL HYSTERECTOMY  1970  . ANKLE SURGERY Right 1980  . BLADDER SURGERY  2012  . CARDIAC CATHETERIZATION  2003   [place]  . CARDIAC CATHETERIZATION     [place]  . COLONOSCOPY  2008, 2015   Dr. Allen Norris  . CYSTOSTOMY    . KNEE SURGERY   08/18/2011   arthroscopic right  . LUMBAR FUSION     L4-5, S-1  . NECK SURGERY  2003  . TOTAL KNEE ARTHROPLASTY Right 04/29/2015   Procedure: TOTAL KNEE ARTHROPLASTY;  Surgeon: Leanor Kail, MD;  Location: [facility] ORS;  Service: Orthopedics;  Laterality: Right;  . TUBAL LIGATION      Family History  Problem Relation Age of Onset  . CAD Father   . Kidney cancer Neg Hx   . Bladder Cancer Neg Hx   . Kidney disease Neg Hx     Social History   Social History  . Marital status: Married    Spouse name: N/A  . Number of children: N/A  . Years of education: N/A   Occupational History  . Not on file.   Social History Main Topics  . Smoking status: Never Smoker  . Smokeless tobacco: Never Used  . Alcohol use No  . Drug use: No  . Sexual activity: Yes    Partners: Male    Birth control/ protection: None   Other Topics Concern  . Not on file   Social History Narrative  . No narrative on file     Current Outpatient Prescriptions:  .  alendronate (FOSAMAX) 70 MG tablet, TAKE 1 TABLET WEEKLY, Disp: 12 tablet, Rfl: 3 .  Amlodipine-Valsartan-HCTZ 5-160-25 MG TABS, TAKE 1 TABLET DAILY, Disp: 90 tablet, Rfl: 1 .  aspirin EC 81 MG tablet, Take 81 mg by mouth every morning., Disp: , Rfl:  .  Cholecalciferol (VITAMIN D3) 1000 UNITS CAPS, Take 1,000 Units by mouth every morning. , Disp: , Rfl:  .  EPINEPHrine (EPIPEN 2-PAK) 0.3 mg/0.3 mL DEVI, Inject 0.3 mg into the muscle as needed. , Disp: , Rfl:  .  fluticasone (FLONASE) 50 MCG/ACT nasal spray, USE TWO SPRAY(S) IN EACH NOSTRIL AT BEDTIME (Patient taking differently: USE TWO SPRAY(S) IN EACH NOSTRIL AT BEDTIME AS NEEDED FOR RHINITIS.), Disp: 48 g, Rfl: 0 .  loratadine (CLARITIN) 10 MG tablet, Take 1 tablet (10 mg total) by mouth daily. (Patient taking differently: Take 10 mg by mouth daily as needed for allergies. ), Disp: 30 tablet, Rfl: 5 .  mirabegron ER (MYRBETRIQ) 25 MG TB24 tablet, Take 25 mg by mouth daily., Disp: , Rfl:  .   Multiple Vitamins-Minerals (CENTRUM SILVER PO), Take 1 tablet by mouth every morning. , Disp: , Rfl:  .  nitroGLYCERIN (NITROSTAT) 0.4 MG SL tablet, Place 1 tablet (0.4 mg total) under the tongue every 5 (five) minutes as needed for chest pain., Disp: 25 tablet, Rfl: 0 .  omeprazole (PRILOSEC) 40 MG capsule, Take 1 capsule (40 mg total) by mouth daily., Disp: 90 capsule, Rfl: 3 .  traZODone (DESYREL) 50 MG tablet, Take 1 tablet (50 mg total) by mouth at bedtime as needed for sleep., Disp: 90 tablet, Rfl: 0  Allergies  Allergen Reactions  . Niaspan [Niacin Er] Other (See Comments)    Patient states she can't remember the reaction because it so long ago.  . Oxycodone-Acetaminophen Nausea Only  . Statins Other (See Comments)    Muscle and joint pain.     ROS  Ten systems reviewed and is negative except as mentioned in HPI   Objective  Vitals:   07/07/17 1142  BP: 136/68  Pulse: 79  Resp: 16  Temp: 98 F (36.7 C)  TempSrc: Oral  SpO2: 98%  Weight: 181 lb 3.2 oz (82.2 kg)  Height: _0  (1.575 m)    Body mass index is 33.14 kg/m.  Physical Exam  Constitutional: Patient appears well-developed and well-nourished. Obese No distress.  HEENT: head atraumatic, normocephalic, pupils equal and reactive to light, ears normal TM bilateral,  neck supple, throat within normal limits Cardiovascular: Normal rate, regular rhythm and normal heart sounds.  No murmur heard. No BLE edema. Pulmonary/Chest: Effort normal and breath sounds normal. No respiratory distress. Abdominal: Soft.  There is no tenderness. Psychiatric: Patient has a normal mood and affect. behavior is normal. Judgment and thought content normal. Neurological exam: no nystagmus, cranial nerves, strength is normal , symptoms worse with head movements, Romberg negative  Recent Results (from the past 2160 hour(s))  Urinalysis, Complete w Microscopic     Status: Abnormal   Collection Time: 04/09/17  2:48 PM  Result Value Ref  Range   Color, Urine YELLOW YELLOW   APPearance CLEAR CLEAR   Specific Gravity, Urine 1.020 1.005 - 1.030   pH 7.0 5.0 - 8.0   Glucose, UA NEGATIVE NEGATIVE mg/dL   Hgb urine dipstick NEGATIVE NEGATIVE   Bilirubin Urine NEGATIVE NEGATIVE   Ketones, ur NEGATIVE NEGATIVE mg/dL   Protein, ur NEGATIVE NEGATIVE mg/dL   Nitrite NEGATIVE NEGATIVE   Leukocytes, UA NEGATIVE NEGATIVE   Squamous Epithelial / LPF 0-5 (A) NONE SEEN   WBC, UA 0-5 0 - 5 WBC/hpf   RBC / HPF 0-5 0 - 5 RBC/hpf   Bacteria,  UA RARE (A) NONE SEEN  BUN     Status: None   Collection Time: 04/30/17 10:11 AM  Result Value Ref Range   BUN 14 6 - 20 mg/dL  Creatinine, serum     Status: Abnormal   Collection Time: 04/30/17 10:11 AM  Result Value Ref Range   Creatinine, Ser 1.00 0.44 - 1.00 mg/dL   GFR calc non Af Amer 55 (L) >60 mL/min   GFR calc Af Amer >60 >60 mL/min    Comment: (NOTE) The eGFR has been calculated using the CKD EPI equation. This calculation has not been validated in all clinical situations. eGFR's persistently <60 mL/min signify possible Chronic Kidney Disease.   Urinalysis, Complete w Microscopic     Status: Abnormal   Collection Time: 05/14/17  1:04 PM  Result Value Ref Range   Color, Urine STRAW (A) YELLOW   APPearance CLEAR CLEAR   Specific Gravity, Urine 1.015 1.005 - 1.030   pH 6.0 5.0 - 8.0   Glucose, UA NEGATIVE NEGATIVE mg/dL   Hgb urine dipstick NEGATIVE NEGATIVE   Bilirubin Urine NEGATIVE NEGATIVE   Ketones, ur NEGATIVE NEGATIVE mg/dL   Protein, ur NEGATIVE NEGATIVE mg/dL   Nitrite NEGATIVE NEGATIVE   Leukocytes, UA NEGATIVE NEGATIVE   Squamous Epithelial / LPF 0-5 (A) NONE SEEN   WBC, UA 0-5 0 - 5 WBC/hpf   RBC / HPF 0-5 0 - 5 RBC/hpf   Bacteria, UA NONE SEEN NONE SEEN  COMPLETE METABOLIC PANEL WITH GFR     Status: Abnormal   Collection Time: 05/21/17  8:09 AM  Result Value Ref Range   Sodium 141 135 - 146 mmol/L   Potassium 4.2 3.5 - 5.3 mmol/L   Chloride 103 98 - 110  mmol/L   CO2 28 20 - 31 mmol/L   Glucose, Bld 96 65 - 99 mg/dL   BUN 17 7 - 25 mg/dL   Creat 1.14 (H) 0.60 - 0.93 mg/dL    Comment:   For patients > or = 72 years of age: The upper reference limit for Creatinine is approximately 13% higher for people identified as African-American.      Total Bilirubin 0.3 0.2 - 1.2 mg/dL   Alkaline Phosphatase 77 33 - 130 U/L   AST 14 10 - 35 U/L   ALT 16 6 - 29 U/L   Total Protein 6.4 6.1 - 8.1 g/dL   Albumin 4.0 3.6 - 5.1 g/dL   Calcium 9.4 8.6 - 10.4 mg/dL   GFR, Est African American 56 (L) >=60 mL/min   GFR, Est Non African American 48 (L) >=60 mL/min  Hemoglobin A1c     Status: None   Collection Time: 05/21/17  8:09 AM  Result Value Ref Range   Hgb A1c MFr Bld 5.4 <5.7 %    Comment:   For the purpose of screening for the presence of diabetes:   <5.7%       Consistent with the absence of diabetes 5.7-6.4 %   Consistent with increased risk for diabetes (prediabetes) >=6.5 %     Consistent with diabetes   This assay result is consistent with a decreased risk of diabetes.   Currently, no consensus exists regarding use of hemoglobin A1c for diagnosis of diabetes in children.   According to American Diabetes Association (ADA) guidelines, hemoglobin A1c <7.0% represents optimal control in non-pregnant diabetic patients. Different metrics may apply to specific patient populations. Standards of Medical Care in Diabetes (ADA).  Mean Plasma Glucose 108 mg/dL  Lipid panel     Status: Abnormal   Collection Time: 05/21/17  8:09 AM  Result Value Ref Range   Cholesterol 198 <200 mg/dL   Triglycerides 192 (H) <150 mg/dL   HDL 42 (L) >50 mg/dL   Total CHOL/HDL Ratio 4.7 <5.0 Ratio   VLDL 38 (H) <30 mg/dL   LDL Cholesterol 118 (H) <100 mg/dL  CBC with Differential/Platelet     Status: Abnormal   Collection Time: 05/21/17  8:09 AM  Result Value Ref Range   WBC 6.6 3.8 - 10.8 K/uL   RBC 3.94 3.80 - 5.10 MIL/uL   Hemoglobin 11.0 (L) 11.7  - 15.5 g/dL   HCT 34.6 (L) 35.0 - 45.0 %   MCV 87.8 80.0 - 100.0 fL   MCH 27.9 27.0 - 33.0 pg   MCHC 31.8 (L) 32.0 - 36.0 g/dL   RDW 13.1 11.0 - 15.0 %   Platelets 258 140 - 400 K/uL   MPV 9.8 7.5 - 12.5 fL   Neutro Abs 4,422 1,500 - 7,800 cells/uL   Lymphs Abs 1,650 850 - 3,900 cells/uL   Monocytes Absolute 462 200 - 950 cells/uL   Eosinophils Absolute 66 15 - 500 cells/uL   Basophils Absolute 0 0 - 200 cells/uL   Neutrophils Relative % 67 %   Lymphocytes Relative 25 %   Monocytes Relative 7 %   Eosinophils Relative 1 %   Basophils Relative 0 %   Smear Review Criteria for review not met       PHQ2/9: Depression screen [facility] 2/9 05/17/2017 01/25/2017 05/15/2016 01/14/2016 12/09/2015  Decreased Interest 0 0 0 0 0  Down, Depressed, Hopeless 0 0 0 0 0  PHQ - 2 Score 0 0 0 0 0     Fall Risk: Fall Risk  05/17/2017 01/25/2017 05/15/2016 01/14/2016 12/09/2015  Falls in the past year? _0       Assessment & Plan  1. Vertigo  She has an appointment scheduled at the end of this month, but symptoms are severe, and we will try to move it up to this week  - Ambulatory referral to ENT  2. Needs flu shot  - Flu vaccine HIGH DOSE PF

## 2017-07-07 NOTE — Patient Instructions (Signed)

## 2017-07-08 DIAGNOSIS — R42 Dizziness and giddiness: Secondary | ICD-10-CM | POA: Diagnosis not present

## 2017-07-22 DIAGNOSIS — R42 Dizziness and giddiness: Secondary | ICD-10-CM | POA: Diagnosis not present

## 2017-07-22 DIAGNOSIS — G4733 Obstructive sleep apnea (adult) (pediatric): Secondary | ICD-10-CM | POA: Diagnosis not present

## 2017-08-04 DIAGNOSIS — R42 Dizziness and giddiness: Secondary | ICD-10-CM | POA: Diagnosis not present

## 2017-08-06 DIAGNOSIS — M1712 Unilateral primary osteoarthritis, left knee: Secondary | ICD-10-CM | POA: Diagnosis not present

## 2017-08-06 DIAGNOSIS — Z6832 Body mass index (BMI) 32.0-32.9, adult: Secondary | ICD-10-CM | POA: Diagnosis not present

## 2017-08-06 DIAGNOSIS — E662 Morbid (severe) obesity with alveolar hypoventilation: Secondary | ICD-10-CM | POA: Diagnosis not present

## 2017-08-06 DIAGNOSIS — I252 Old myocardial infarction: Secondary | ICD-10-CM | POA: Diagnosis not present

## 2017-08-06 DIAGNOSIS — I2510 Atherosclerotic heart disease of native coronary artery without angina pectoris: Secondary | ICD-10-CM | POA: Diagnosis not present

## 2017-08-06 DIAGNOSIS — I10 Essential (primary) hypertension: Secondary | ICD-10-CM | POA: Diagnosis not present

## 2017-08-06 DIAGNOSIS — G4700 Insomnia, unspecified: Secondary | ICD-10-CM | POA: Diagnosis not present

## 2017-08-06 DIAGNOSIS — M479 Spondylosis, unspecified: Secondary | ICD-10-CM | POA: Diagnosis not present

## 2017-08-06 DIAGNOSIS — G4733 Obstructive sleep apnea (adult) (pediatric): Secondary | ICD-10-CM | POA: Diagnosis not present

## 2017-08-17 ENCOUNTER — Other Ambulatory Visit: Payer: Self-pay | Admitting: Family Medicine

## 2017-08-17 NOTE — Telephone Encounter (Signed)
Refill request for general medication of Flonase to [facility].   Last office visit: 07/07/2017.

## 2017-09-20 ENCOUNTER — Encounter: Payer: Self-pay | Admitting: Family Medicine

## 2017-09-20 ENCOUNTER — Ambulatory Visit (INDEPENDENT_AMBULATORY_CARE_PROVIDER_SITE_OTHER): Payer: Medicare PPO | Admitting: Family Medicine

## 2017-09-20 VITALS — BP 124/60 | HR 74 | Resp 14 | Ht 62.0 in | Wt 182.8 lb

## 2017-09-20 DIAGNOSIS — I10 Essential (primary) hypertension: Secondary | ICD-10-CM | POA: Diagnosis not present

## 2017-09-20 DIAGNOSIS — G4733 Obstructive sleep apnea (adult) (pediatric): Secondary | ICD-10-CM | POA: Diagnosis not present

## 2017-09-20 DIAGNOSIS — M75102 Unspecified rotator cuff tear or rupture of left shoulder, not specified as traumatic: Secondary | ICD-10-CM | POA: Diagnosis not present

## 2017-09-20 DIAGNOSIS — I2510 Atherosclerotic heart disease of native coronary artery without angina pectoris: Secondary | ICD-10-CM

## 2017-09-20 DIAGNOSIS — D638 Anemia in other chronic diseases classified elsewhere: Secondary | ICD-10-CM

## 2017-09-20 DIAGNOSIS — E782 Mixed hyperlipidemia: Secondary | ICD-10-CM

## 2017-09-20 DIAGNOSIS — Z9989 Dependence on other enabling machines and devices: Secondary | ICD-10-CM

## 2017-09-20 DIAGNOSIS — K219 Gastro-esophageal reflux disease without esophagitis: Secondary | ICD-10-CM

## 2017-09-20 DIAGNOSIS — I209 Angina pectoris, unspecified: Secondary | ICD-10-CM

## 2017-09-20 DIAGNOSIS — R42 Dizziness and giddiness: Secondary | ICD-10-CM | POA: Diagnosis not present

## 2017-09-20 DIAGNOSIS — G4700 Insomnia, unspecified: Secondary | ICD-10-CM | POA: Diagnosis not present

## 2017-09-20 DIAGNOSIS — M8580 Other specified disorders of bone density and structure, unspecified site: Secondary | ICD-10-CM | POA: Diagnosis not present

## 2017-09-20 DIAGNOSIS — E559 Vitamin D deficiency, unspecified: Secondary | ICD-10-CM

## 2017-09-20 DIAGNOSIS — I2583 Coronary atherosclerosis due to lipid rich plaque: Secondary | ICD-10-CM

## 2017-09-20 MED ORDER — TRAZODONE HCL 50 MG PO TABS: 50.0000 mg | ORAL_TABLET | Freq: Every evening | ORAL | 0 refills | Status: DC | PRN

## 2017-09-20 MED ORDER — AMLODIPINE-VALSARTAN-HCTZ 5-160-25 MG PO TABS: 1.0000 | ORAL_TABLET | Freq: Every day | ORAL | 1 refills | Status: DC

## 2017-09-20 NOTE — Progress Notes (Signed)
Name: Jodi HellingRosely J Kirby   MRN: [id number]    DOB: February 13, 1946   Date:09/20/2017       Progress Note  Subjective  Chief Complaint  Chief Complaint  Patient presents with  . Hypertension  . Gastroesophageal Reflux  . Medication Refill    HPI  OSA: using CPAP every night, she keeps it on for about 7 hours per night. She states some nights she is taking Trazodone prn and tolerating medication well without side effects.   Knee replacement surgery right: surgery was June 27 th, 2016, she has completed her PT and flexion up o 128 extension 180 degrees ( she states it is even better now - flexing more ), she is still doing her exercises at home and is back at the gym three times weekly. She is off Meloxicam , she states occasionally right is knee is locking up, she has not gone back to see Dr. Erin SonsHarold [facility] yet.   HTN: she is back on one pill of Exforge HCTZ and bp is at goal, no chest pain or palpitation, denies side effects of medication. BP is at goal today, bp at home has been controlled below 140/70 on a regular basis.   Nocturia: seen by Dr. Apolinar JunesBrandon, started on Myrbetriq and states she has stopped medication because episodes of nocturia went down from  4 episodes per night and is down to twice per night.   Hyperlipidemia: unable to tolerate statins, even in a low dose of 2.5 mg of Crestor, muscles aches were severe and she stopped medication. Dr. Mariah MillingGollan advised Zetia but she decided not to take it  GERD: under control with Omeprazole in am's and symptoms have been well controlled .No heartburn, no regurgitation.   Angina Pectoris: she has CAD, mild disease, seeing Dr. Mariah MillingGollan, [facility] - last episode of chest pain, doing well now. Taking aspirin and ARB, not on beta-blocker because of bradycardia and not on statin because of intolerance, she refuses to take Zetia.  Osteopenia: diagnosed in 2016 - and was started on Fosamax, she is doing well, last bone density in  2018, no side effects of medication  Vertigo: she has a history of BPPV, had Epley maneuver done in 2017  and symptoms resolved, she had another flare 07/2017 with  spinning sensation and lack of balance, sometimes associated with nausea, but no vomiting. She denies tinnitus or hearing loss. She was seen by ENT they performed another Epley maneuver and symptoms are under control again.    Patient Active Problem List   Diagnosis Date Noted  . Aortic atherosclerosis ([facility]) 05/15/2017  . Hyperglycemia 11/17/2016  . Chest pain 06/27/2016  . Osteopenia 05/15/2016  . Insomnia 11/08/2015  . Angina pectoris ([facility]) 07/10/2015  . Seasonal allergic rhinitis 07/09/2015  . GERD (gastroesophageal reflux disease) 07/09/2015  . Bee sting allergy 07/09/2015  . History of knee replacement procedure of right knee 04/29/2015  . History of colonic polyps 09/24/2014  . Fibrocystic breast 09/24/2014  . Arthritis of knee, degenerative 06/12/2014  . Hyperlipidemia 02/09/2013  . Coronary artery disease 02/09/2013  . Obstructive sleep apnea on CPAP 02/09/2013  . Hypertension 02/09/2013    Past Surgical History:  Procedure Laterality Date  . ABDOMINAL HYSTERECTOMY  1970  . ANKLE SURGERY Right 1980  . BLADDER SURGERY  2012  . CARDIAC CATHETERIZATION  2003   [place]  . CARDIAC CATHETERIZATION     [place]  . COLONOSCOPY  2008, 2015   Dr. Servando SnareWohl  . CYSTOSTOMY    .  KNEE SURGERY  08/18/2011   arthroscopic right  . LUMBAR FUSION     L4-5, S-1  . NECK SURGERY  2003  . TOTAL KNEE ARTHROPLASTY Right 04/29/2015   Performed by Erin Sons, MD at [facility]  . TUBAL LIGATION      Family History  Problem Relation Age of Onset  . CAD Father   . Kidney cancer Neg Hx   . Bladder Cancer Neg Hx   . Kidney disease Neg Hx     Social History   Socioeconomic History  . Marital status: Married    Spouse name: Not on file  . Number of children: Not on file  . Years of education: Not on file  . Highest  education level: Not on file  Social Needs  . Financial resource strain: Not on file  . Food insecurity - worry: Not on file  . Food insecurity - inability: Not on file  . Transportation needs - medical: Not on file  . Transportation needs - non-medical: Not on file  Occupational History  . Not on file  Tobacco Use  . Smoking status: Never Smoker  . Smokeless tobacco: Never Used  Substance and Sexual Activity  . Alcohol use: No    Alcohol/week: 0.0 oz  . Drug use: No  . Sexual activity: Yes    Partners: Male    Birth control/protection: None  Other Topics Concern  . Not on file  Social History Narrative  . Not on file     Current Outpatient Medications:  .  acetaminophen (TYLENOL) 650 MG CR tablet, Take 1 tablet daily as needed by mouth., Disp: , Rfl:  .  alendronate (FOSAMAX) 70 MG tablet, TAKE 1 TABLET WEEKLY, Disp: 12 tablet, Rfl: 3 .  Amlodipine-Valsartan-HCTZ 5-160-25 MG TABS, TAKE 1 TABLET DAILY, Disp: 90 tablet, Rfl: 1 .  aspirin EC 81 MG tablet, Take 81 mg by mouth every morning., Disp: , Rfl:  .  Cholecalciferol (VITAMIN D3) 1000 UNITS CAPS, Take 1,000 Units by mouth every morning. , Disp: , Rfl:  .  EPINEPHrine (EPIPEN 2-PAK) 0.3 mg/0.3 mL DEVI, Inject 0.3 mg into the muscle as needed. , Disp: , Rfl:  .  fluticasone (FLONASE) 50 MCG/ACT nasal spray, USE TWO SPRAY(S) IN EACH NOSTRIL AT BEDTIME, Disp: 48 g, Rfl: 1 .  loratadine (CLARITIN) 10 MG tablet, Take 1 tablet (10 mg total) by mouth daily. (Patient taking differently: Take 10 mg by mouth daily as needed for allergies. ), Disp: 30 tablet, Rfl: 5 .  Multiple Vitamins-Minerals (CENTRUM SILVER PO), Take 1 tablet by mouth every morning. , Disp: , Rfl:  .  nitroGLYCERIN (NITROSTAT) 0.4 MG SL tablet, Place 1 tablet (0.4 mg total) under the tongue every 5 (five) minutes as needed for chest pain., Disp: 25 tablet, Rfl: 0 .  omeprazole (PRILOSEC) 40 MG capsule, Take 1 capsule (40 mg total) by mouth daily., Disp: 90 capsule,  Rfl: 3 .  traZODone (DESYREL) 50 MG tablet, Take 1 tablet (50 mg total) by mouth at bedtime as needed for sleep., Disp: 90 tablet, Rfl: 0  Allergies  Allergen Reactions  . Niaspan [Niacin Er] Other (See Comments)    Patient states she can't remember the reaction because it so long ago.  . Oxycodone-Acetaminophen Nausea Only  . Statins Other (See Comments)    Muscle and joint pain.     ROS  Constitutional: Negative for fever or weight change.  Respiratory: Negative for cough and shortness of breath.   Cardiovascular:  Negative for chest pain or palpitations.  Gastrointestinal: Negative for abdominal pain, no bowel changes.  Musculoskeletal: Negative for gait problem or joint swelling.  Skin: Negative for rash.  Neurological: Negative for dizziness or headache.  No other specific complaints in a complete review of systems (except as listed in HPI above).  Objective  Vitals:   09/20/17 0743  BP: 124/60  Pulse: 74  Resp: 14  SpO2: 97%  Weight: 182 lb 12.8 oz (82.9 kg)  Height: 5\' 2"  (1.575 m)    Body mass index is 33.43 kg/m.  Physical Exam  Constitutional: Patient appears well-developed and well-nourished. Obese No distress.  HEENT: head atraumatic, normocephalic, pupils equal and reactive to light,neck supple, throat within normal limits Cardiovascular: Normal rate, regular rhythm and normal heart sounds.  No murmur heard. No BLE edema. Pulmonary/Chest: Effort normal and breath sounds normal. No respiratory distress. Abdominal: Soft.  There is no tenderness. Psychiatric: Patient has a normal mood and affect. behavior is normal. Judgment and thought content normal.  PHQ2/9: [facility] screen [facility] 2/9 05/17/2017 01/25/2017 05/15/2016 01/14/2016 12/09/2015  Decreased Interest 0 0 0 0 0  Down, Depressed, Hopeless 0 0 0 0 0  PHQ - 2 Score 0 0 0 0 0     Fall Risk: Fall Risk  09/20/2017 05/17/2017 01/25/2017 05/15/2016 01/14/2016  Falls in the past year? No No No No No      Assessment & Plan  1. Essential hypertension  - Amlodipine-Valsartan-HCTZ 5-160-25 MG TABS; Take 1 tablet daily by mouth.  Dispense: 90 tablet; Refill: 1  2. Gastroesophageal reflux disease without esophagitis  Well controlled  3. Angina pectoris ([facility])  No recent episodes   4. Mixed hyperlipidemia  She refused Zetia, intolerance to statin therapy  5. Osteopenia, unspecified location  Continue Fosamax  6. Insomnia, unspecified type  - traZODone (DESYREL) 50 MG tablet; Take 1 tablet (50 mg total) at bedtime as needed by mouth for sleep.  Dispense: 90 tablet; Refill: 0  7. Rotator cuff syndrome of left shoulder   8. Anemia of chronic disease  Recheck labs next visit   9. Vertigo  resolved  10. Vitamin D deficiency  Continue supplementation   12. Obstructive sleep apnea on CPAP  Doing well, continue CPAP nightly

## 2017-10-07 ENCOUNTER — Encounter: Payer: Self-pay | Admitting: General Surgery

## 2017-10-07 DIAGNOSIS — Z1231 Encounter for screening mammogram for malignant neoplasm of breast: Secondary | ICD-10-CM | POA: Diagnosis not present

## 2017-10-08 DIAGNOSIS — M25551 Pain in right hip: Secondary | ICD-10-CM | POA: Diagnosis not present

## 2017-10-08 DIAGNOSIS — M5441 Lumbago with sciatica, right side: Secondary | ICD-10-CM | POA: Diagnosis not present

## 2017-10-08 DIAGNOSIS — M545 Low back pain: Secondary | ICD-10-CM | POA: Diagnosis not present

## 2017-10-14 ENCOUNTER — Ambulatory Visit (INDEPENDENT_AMBULATORY_CARE_PROVIDER_SITE_OTHER): Payer: Medicare PPO | Admitting: General Surgery

## 2017-10-14 ENCOUNTER — Encounter: Payer: Self-pay | Admitting: General Surgery

## 2017-10-14 VITALS — BP 138/74 | HR 74 | Resp 14 | Ht 67.0 in | Wt 185.0 lb

## 2017-10-14 DIAGNOSIS — Z9889 Other specified postprocedural states: Secondary | ICD-10-CM

## 2017-10-14 NOTE — Progress Notes (Signed)
Patient ID: Jodi HellingRosely J Kirby, female   DOB: 18-May-1946, 71 y.o.   MRN: [id number]  Chief Complaint  Patient presents with  . Follow-up    HPI Jodi Kirby is a 71 y.o. female who presents for a breast evaluation. The most recent mammogram was done on 10/07/2017 .  Patient does perform regular self breast checks and gets regular mammograms done.   Marland Kitchen.HPI  Past Medical History:  Diagnosis Date  . Allergic rhinitis, cause unspecified   . Coronary artery disease   . Dysmetabolic syndrome X   . Dysuria   . Esophageal reflux   . Essential hypertension, benign   . Hyperlipidemia   . Lumbago   . Lump or mass in breast   . Myalgia and myositis, unspecified   . Obstructive sleep apnea   . Osteoarthrosis, unspecified whether generalized or localized, lower leg   . Other abnormal glucose   . Other and unspecified angina pectoris   . 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 Toxic effect of venom(989.5)   . Unspecified iridocyclitis   . Unspecified vitamin D deficiency     Past Surgical History:  Procedure Laterality Date  . ABDOMINAL HYSTERECTOMY  1970  . ANKLE SURGERY Right 1980  . BLADDER SURGERY  2012  . CARDIAC CATHETERIZATION  2003   [facility]  . CARDIAC CATHETERIZATION     [facility]  . COLONOSCOPY  2008, 2015   Dr. Servando SnareWohl  . CYSTOSTOMY    . KNEE SURGERY  08/18/2011   arthroscopic right  . LUMBAR FUSION     L4-5, S-1  . NECK SURGERY  2003  . TOTAL KNEE ARTHROPLASTY Right 04/29/2015   Procedure: TOTAL KNEE ARTHROPLASTY;  Surgeon: Erin SonsHarold Kernodle, MD;  Location: [facility] ORS;  Service: Orthopedics;  Laterality: Right;  . TUBAL LIGATION      Family History  Problem Relation Age of Onset  . CAD Father   . Kidney cancer Neg Hx   . Bladder Cancer Neg Hx   . Kidney disease Neg Hx     Social History Social History   Tobacco Use  . Smoking status: Never Smoker  . Smokeless tobacco: Never Used  Substance Use Topics  .  Alcohol use: No    Alcohol/week: 0.0 oz  . Drug use: No    Allergies  Allergen Reactions  . Niaspan [Niacin Er] Other (See Comments)    Patient states she can't remember the reaction because it so long ago.  . Oxycodone-Acetaminophen Nausea Only  . Statins Other (See Comments)    Muscle and joint pain.    Current Outpatient Medications  Medication Sig Dispense Refill  . acetaminophen (TYLENOL) 650 MG CR tablet Take 1 tablet daily as needed by mouth.    Marland Kitchen. alendronate (FOSAMAX) 70 MG tablet TAKE 1 TABLET WEEKLY 12 tablet 3  . Amlodipine-Valsartan-HCTZ 5-160-25 MG TABS Take 1 tablet daily by mouth. 90 tablet 1  . aspirin EC 81 MG tablet Take 81 mg by mouth every morning.    . Cholecalciferol (VITAMIN D3) 1000 UNITS CAPS Take 1,000 Units by mouth every morning.     Marland Kitchen. EPINEPHrine (EPIPEN 2-PAK) 0.3 mg/0.3 mL DEVI Inject 0.3 mg into the muscle as needed.     . fluticasone (FLONASE) 50 MCG/ACT nasal spray USE TWO SPRAY(S) IN EACH NOSTRIL AT BEDTIME 48 Kirby 1  . loratadine (CLARITIN) 10 MG tablet Take 1 tablet (10 mg total) by mouth daily. (Patient taking differently: Take 10  mg by mouth daily as needed for allergies. ) 30 tablet 5  . Multiple Vitamins-Minerals (CENTRUM SILVER PO) Take 1 tablet by mouth every morning.     . nitroGLYCERIN (NITROSTAT) 0.4 MG SL tablet Place 1 tablet (0.4 mg total) under the tongue every 5 (five) minutes as needed for chest pain. 25 tablet 0  . omeprazole (PRILOSEC) 40 MG capsule Take 1 capsule (40 mg total) by mouth daily. 90 capsule 3  . traZODone (DESYREL) 50 MG tablet Take 1 tablet (50 mg total) at bedtime as needed by mouth for sleep. 90 tablet 0   No current facility-administered medications for this visit.     Review of Systems Review of Systems  Constitutional: Negative.   Respiratory: Negative.   Cardiovascular: Negative.     Blood pressure 138/74, pulse 74, resp. rate 14, height 5\' 7"  (1.702 m), weight 185 lb (83.9 kg).  Physical Exam Physical  Exam  Constitutional: She is oriented to person, place, and time. She appears well-developed and well-nourished.  Eyes: Conjunctivae are normal. No scleral icterus.  Neck: Neck supple.  Cardiovascular: Normal rate, regular rhythm and normal heart sounds.  Pulmonary/Chest: Effort normal and breath sounds normal. Right breast exhibits no inverted nipple, no mass, no nipple discharge, no skin change and no tenderness. Left breast exhibits no inverted nipple, no mass, no nipple discharge, no skin change and no tenderness.  Lymphadenopathy:    She has no cervical adenopathy.    She has no axillary adenopathy.  Neurological: She is alert and oriented to person, place, and time.  Skin: Skin is warm and dry.    Data Reviewed Mammogram reviewed   Assessment    Stable exam. History of prior breast mass, FCD      Plan    Patient to return to her PCP for mammogram and breast checks. The patient is aware to call back for any questions or concerns.     HPI, Physical Exam, Assessment and Plan have been scribed under the direction and in the presence of Kathreen CosierS. Kirby. Anberlin Diez, MD  Ples SpecterJessica Qualls, CMA  I have completed the exam and reviewed the above documentation for accuracy and completeness.  I agree with the above.  [employer] has been used and any errors in dictation or transcription are unintentional.  Jodi Kirby. Evette CristalSankar, M.D., F.A.C.S.    Jodi Kirby,Jodi Kirby 10/14/2017, 2:27 PM

## 2017-10-14 NOTE — Patient Instructions (Signed)
Patient to return to her PCP for mammogram and breast checks.The patient is aware to call back for any questions or concerns. 

## 2017-11-29 DIAGNOSIS — H2513 Age-related nuclear cataract, bilateral: Secondary | ICD-10-CM | POA: Diagnosis not present

## 2017-12-21 ENCOUNTER — Ambulatory Visit (INDEPENDENT_AMBULATORY_CARE_PROVIDER_SITE_OTHER): Payer: Medicare PPO

## 2017-12-21 VITALS — BP 122/70 | HR 60 | Temp 98.8°F | Resp 12 | Ht 67.0 in | Wt 185.4 lb

## 2017-12-21 DIAGNOSIS — Z0000 Encounter for general adult medical examination without abnormal findings: Secondary | ICD-10-CM

## 2017-12-21 DIAGNOSIS — G3184 Mild cognitive impairment, so stated: Secondary | ICD-10-CM | POA: Diagnosis not present

## 2017-12-21 NOTE — Patient Instructions (Signed)
[name] , Thank you for taking time to come for your Medicare Wellness Visit. I appreciate your ongoing commitment to your health goals. Please review the following plan we discussed and let me know if I can assist you in the future.   Screening recommendations/referrals: Colorectal Screening: Completed colonoscopy 12/14/13. Repeat every 10 years. Mammogram: Completed 10/07/17. Repeat every year. Bone Density: Completed 06/08/17. Osteoporotic screenings no longer required d/t treatment of osteoporosis with Fosamax. Lung Cancer Screening: You do not qualify for this screening Hepatitis C Screening: Declined HIV/Syphilis/Hepatitis B Screening: You do not qualify for this screening   Vision/Dental Exams: Recommended yearly ophthalmology/optometry visit for glaucoma screening and checkup Recommended yearly dental visit for hygiene and checkup  Vaccinations: Influenza vaccine: Completed 07/07/17 Pneumococcal vaccine: Completed PCV13 12/05/14 and PPSV23 02/02/13 Tdap vaccine: Completed 02/23/08. Will need to repeat this vaccine on or oafter 02/22/18 Shingles vaccine: Please call your insurance company to determine your out of pocket expense for the Shingrix vaccine. You may also receive this vaccine at your local pharmacy or Health Dept.  Advanced directives: Advance directive discussed with you today. I have provided a copy for you to complete at home and have notarized. Once this is complete please bring a copy in to our office so we can scan it into your chart.  Conditions/risks identified: Recommend to drink at least 6-8 8oz glasses of water per day.  Next appointment: You are scheduled to see Dr. Carlynn Kirby on 01/18/18 @ 8:00am.   Preventive Care 65 Years and Older, Female Preventive care refers to lifestyle choices and visits with your health care provider that can promote health and wellness. What does preventive care include?  A yearly physical exam. This is also called an annual well  check.  Dental exams once or twice a year.  Routine eye exams. Ask your health care provider how often you should have your eyes checked.  Personal lifestyle choices, including:  Daily care of your teeth and gums.  Regular physical activity.  Eating a healthy diet.  Avoiding tobacco and drug use.  Limiting alcohol use.  Practicing safe sex.  Taking low-dose aspirin every day.  Taking vitamin and mineral supplements as recommended by your health care provider. What happens during an annual well check? The services and screenings done by your health care provider during your annual well check will depend on your age, overall health, lifestyle risk factors, and family history of disease. Counseling  Your health care provider may ask you questions about your:  Alcohol use.  Tobacco use.  Drug use.  Emotional well-being.  Home and relationship well-being.  Sexual activity.  Eating habits.  History of falls.  Memory and ability to understand (cognition).  Work and work Astronomerenvironment.  Reproductive health. Screening  You may have the following tests or measurements:  Height, weight, and BMI.  Blood pressure.  Lipid and cholesterol levels. These may be checked every 5 years, or more frequently if you are over 659 years old.  Skin check.  Lung cancer screening. You may have this screening every year starting at age 73 if you have a 30-pack-year history of smoking and currently smoke or have quit within the past 15 years.  Fecal occult blood test (FOBT) of the stool. You may have this test every year starting at age 73.  Flexible sigmoidoscopy or colonoscopy. You may have a sigmoidoscopy every 5 years or a colonoscopy every 10 years starting at age 73.  Hepatitis C blood test.  Hepatitis B blood  test.  Sexually transmitted disease (STD) testing.  Diabetes screening. This is done by checking your blood sugar (glucose) after you have not eaten for a while  (fasting). You may have this done every 1-3 years.  Bone density scan. This is done to screen for osteoporosis. You may have this done starting at age 62.  Mammogram. This may be done every 1-2 years. Talk to your health care provider about how often you should have regular mammograms. Talk with your health care provider about your test results, treatment options, and if necessary, the need for more tests. Vaccines  Your health care provider may recommend certain vaccines, such as:  Influenza vaccine. This is recommended every year.  Tetanus, diphtheria, and acellular pertussis (Tdap, Td) vaccine. You may need a Td booster every 10 years.  Zoster vaccine. You may need this after age 77.  Pneumococcal 13-valent conjugate (PCV13) vaccine. One dose is recommended after age 67.  Pneumococcal polysaccharide (PPSV23) vaccine. One dose is recommended after age 19. Talk to your health care provider about which screenings and vaccines you need and how often you need them. This information is not intended to replace advice given to you by your health care provider. Make sure you discuss any questions you have with your health care provider. Document Released: 11/15/2015 Document Revised: 07/08/2016 Document Reviewed: 08/20/2015 Elsevier Interactive Patient Education  2017 [employer] Prevention in the Home Falls can cause injuries. They can happen to people of all ages. There are many things you can do to make your home safe and to help prevent falls. What can I do on the outside of my home?  Regularly fix the edges of walkways and driveways and fix any cracks.  Remove anything that might make you trip as you walk through a door, such as a raised step or threshold.  Trim any bushes or trees on the path to your home.  Use bright outdoor lighting.  Clear any walking paths of anything that might make someone trip, such as rocks or tools.  Regularly check to see if handrails are loose  or broken. Make sure that both sides of any steps have handrails.  Any raised decks and porches should have guardrails on the edges.  Have any leaves, snow, or ice cleared regularly.  Use sand or salt on walking paths during winter.  Clean up any spills in your garage right away. This includes oil or grease spills. What can I do in the bathroom?  Use night lights.  Install grab bars by the toilet and in the tub and shower. Do not use towel bars as grab bars.  Use non-skid mats or decals in the tub or shower.  If you need to sit down in the shower, use a plastic, non-slip stool.  Keep the floor dry. Clean up any water that spills on the floor as soon as it happens.  Remove soap buildup in the tub or shower regularly.  Attach bath mats securely with double-sided non-slip rug tape.  Do not have throw rugs and other things on the floor that can make you trip. What can I do in the bedroom?  Use night lights.  Make sure that you have a light by your bed that is easy to reach.  Do not use any sheets or blankets that are too big for your bed. They should not hang down onto the floor.  Have a firm chair that has side arms. You can use this for support while  you get dressed.  Do not have throw rugs and other things on the floor that can make you trip. What can I do in the kitchen?  Clean up any spills right away.  Avoid walking on wet floors.  Keep items that you use a lot in easy-to-reach places.  If you need to reach something above you, use a strong step stool that has a grab bar.  Keep electrical cords out of the way.  Do not use floor polish or wax that makes floors slippery. If you must use wax, use non-skid floor wax.  Do not have throw rugs and other things on the floor that can make you trip. What can I do with my stairs?  Do not leave any items on the stairs.  Make sure that there are handrails on both sides of the stairs and use them. Fix handrails that are  broken or loose. Make sure that handrails are as long as the stairways.  Check any carpeting to make sure that it is firmly attached to the stairs. Fix any carpet that is loose or worn.  Avoid having throw rugs at the top or bottom of the stairs. If you do have throw rugs, attach them to the floor with carpet tape.  Make sure that you have a light switch at the top of the stairs and the bottom of the stairs. If you do not have them, ask someone to add them for you. What else can I do to help prevent falls?  Wear shoes that:  Do not have high heels.  Have rubber bottoms.  Are comfortable and fit you well.  Are closed at the toe. Do not wear sandals.  If you use a stepladder:  Make sure that it is fully opened. Do not climb a closed stepladder.  Make sure that both sides of the stepladder are locked into place.  Ask someone to hold it for you, if possible.  Clearly mark and make sure that you can see:  Any grab bars or handrails.  First and last steps.  Where the edge of each step is.  Use tools that help you move around (mobility aids) if they are needed. These include:  Canes.  Walkers.  Scooters.  Crutches.  Turn on the lights when you go into a dark area. Replace any light bulbs as soon as they burn out.  Set up your furniture so you have a clear path. Avoid moving your furniture around.  If any of your floors are uneven, fix them.  If there are any pets around you, be aware of where they are.  Review your medicines with your doctor. Some medicines can make you feel dizzy. This can increase your chance of falling. Ask your doctor what other things that you can do to help prevent falls. This information is not intended to replace advice given to you by your health care provider. Make sure you discuss any questions you have with your health care provider. Document Released: 08/15/2009 Document Revised: 03/26/2016 Document Reviewed: 11/23/2014 Elsevier  Interactive Patient Education  2017 [employer].

## 2017-12-21 NOTE — Progress Notes (Signed)
Subjective:   Jodi Kirby is a 72 y.o. female who presents for Medicare Annual (Subsequent) preventive examination.  Review of Systems:  N/A Cardiac Risk Factors include: advanced age (>66men, >20 women);dyslipidemia;hypertension     Objective:     Vitals: Ht 5\' 7"  (1.702 m)   Wt 185 lb 6.4 oz (84.1 kg)   BMI 29.04 kg/m   Body mass index is 29.04 kg/m.  Advanced Directives 12/21/2017 05/17/2017 01/25/2017 11/16/2016 07/01/2016 06/27/2016 06/27/2016  Does Patient Have a Medical Advance Directive? No No No No No No No  Would patient like information on creating a medical advance directive? Yes (MAU/Ambulatory/Procedural Areas - Information given) - - - No - patient declined information No - patient declined information No - patient declined information    Tobacco Social History   Tobacco Use  Smoking Status Never Smoker  Smokeless Tobacco Never Used  Tobacco Comment   smoking cessation materials not required     Counseling given: No Comment: smoking cessation materials not required   Clinical Intake:  Pre-visit preparation completed: Yes  Pain : No/denies pain   BMI - recorded: 29.04 Nutritional Status: BMI 25 -29 Overweight Nutritional Risks: None Diabetes: No  How often do you need to have someone help you when you read instructions, pamphlets, or other written materials from your doctor or pharmacy?: 1 - Never  Interpreter Needed?: No  Information entered by :: AEversole, LPN  Past Medical History:  Diagnosis Date  . Allergic rhinitis, cause unspecified   . Coronary artery disease   . Dysmetabolic syndrome X   . Dysuria   . Esophageal reflux   . Essential hypertension, benign   . Hyperlipidemia   . Lumbago   . Lump or mass in breast   . Myalgia and myositis, unspecified   . Obstructive sleep apnea   . Osteoarthrosis, unspecified whether generalized or localized, lower leg   . Other abnormal glucose   . Other and unspecified angina pectoris   .  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 Toxic effect of venom(989.5)   . Unspecified iridocyclitis   . Unspecified vitamin D deficiency    Past Surgical History:  Procedure Laterality Date  . ABDOMINAL HYSTERECTOMY  1970  . ANKLE SURGERY Right 1980  . BLADDER SURGERY  2012  . CARDIAC CATHETERIZATION  2003   [facility]  . CARDIAC CATHETERIZATION     [place]  . COLONOSCOPY  2008, 2015   Dr. Servando Snare  . CYSTOSTOMY    . KNEE SURGERY  08/18/2011   arthroscopic right  . LUMBAR FUSION     L4-5, S-1  . NECK SURGERY  2003  . TOTAL KNEE ARTHROPLASTY Right 04/29/2015   Procedure: TOTAL KNEE ARTHROPLASTY;  Surgeon: Erin Sons, MD;  Location: [facility] ORS;  Service: Orthopedics;  Laterality: Right;  . TUBAL LIGATION     Family History  Problem Relation Age of Onset  . Kidney disease Mother   . Hypertension Mother   . CAD Father   . Healthy Sister   . Kidney cancer Neg Hx   . Bladder Cancer Neg Hx    Social History   Socioeconomic History  . Marital status: Married    Spouse name: Morris  . Number of children: 4  . Years of education: None  . Highest education level: 11th grade  Social Needs  . Financial resource strain: Not hard at all  . Food insecurity - worry: Never true  .  Food insecurity - inability: Never true  . Transportation needs - medical: No  . Transportation needs - non-medical: No  Occupational History  . Occupation: Retired  Tobacco Use  . Smoking status: Never Smoker  . Smokeless tobacco: Never Used  . Tobacco comment: smoking cessation materials not required  Substance and Sexual Activity  . Alcohol use: No    Alcohol/week: 0.0 oz  . Drug use: No  . Sexual activity: Not Currently    Partners: Male    Birth control/protection: None  Other Topics Concern  . None  Social History Narrative  . None    Outpatient Encounter Medications as of 12/21/2017  Medication Sig  . acetaminophen (TYLENOL) 650 MG CR  tablet Take 1 tablet daily as needed by mouth.  Marland Kitchen alendronate (FOSAMAX) 70 MG tablet TAKE 1 TABLET WEEKLY  . Amlodipine-Valsartan-HCTZ 5-160-25 MG TABS Take 1 tablet daily by mouth.  Marland Kitchen aspirin EC 81 MG tablet Take 81 mg by mouth every morning.  . Cholecalciferol (VITAMIN D3) 1000 UNITS CAPS Take 1,000 Units by mouth every morning.   Marland Kitchen EPINEPHrine (EPIPEN 2-PAK) 0.3 mg/0.3 mL DEVI Inject 0.3 mg into the muscle as needed.   . fluticasone (FLONASE) 50 MCG/ACT nasal spray USE TWO SPRAY(S) IN EACH NOSTRIL AT BEDTIME  . loratadine (CLARITIN) 10 MG tablet Take 1 tablet (10 mg total) by mouth daily. (Patient taking differently: Take 10 mg by mouth daily as needed for allergies. )  . Multiple Vitamins-Minerals (CENTRUM SILVER PO) Take 1 tablet by mouth every morning.   . nitroGLYCERIN (NITROSTAT) 0.4 MG SL tablet Place 1 tablet (0.4 mg total) under the tongue every 5 (five) minutes as needed for chest pain.  Marland Kitchen omeprazole (PRILOSEC) 40 MG capsule Take 1 capsule (40 mg total) by mouth daily.  . traZODone (DESYREL) 50 MG tablet Take 1 tablet (50 mg total) at bedtime as needed by mouth for sleep.   No facility-administered encounter medications on file as of 12/21/2017.     Activities of Daily Living In your present state of health, do you have any difficulty performing the following activities: 12/21/2017 05/17/2017  Hearing? N N  Comment denies wearing hearing aids -  Vision? Y N  Comment scheduled for cataract removal; wears eyeglasses -  Difficulty concentrating or making decisions? N N  Walking or climbing stairs? Y N  Comment RTKR; ocassional joint pain -  Dressing or bathing? N N  Doing errands, shopping? N N  Preparing Food and eating ? N -  Comment denies wearing dentures -  Using the Toilet? N -  In the past six months, have you accidently leaked urine? N -  Do you have problems with loss of bowel control? N -  Managing your Medications? N -  Managing your Finances? N -  Housekeeping or  managing your Housekeeping? N -  Some recent data might be hidden    Patient Care Team: Alba Cory, MD as PCP - General Mariah Milling Tollie Pizza, MD as Consulting Physician (Cardiology) Vanna Scotland, MD as Consulting Physician (Urology)    Assessment:   This is a routine wellness examination for Elica.  Exercise Activities and Dietary recommendations Current Exercise Habits: Structured exercise class, Type of exercise: strength training/weights, Time (Minutes): 60, Frequency (Times/Week): 3, Weekly Exercise (Minutes/Week): 180, Intensity: Mild, Exercise limited by: None identified  Goals    . DIET - INCREASE WATER INTAKE     Recommend to drink at least 6-8 8oz glasses of water per day.  Fall Risk Fall Risk  12/21/2017 09/20/2017 05/17/2017 01/25/2017 05/15/2016  Falls in the past year? No No No No No   Is the patient's home free of loose throw rugs in walkways, pet beds, electrical cords, etc?   Yes Does the patient have any grab bars in the bathroom? Yes  Does the patient use a shower chair when bathing? Yes Does the patient have any stairs in or around the home? Yes If so, are there any handrails?  Yes Does the patient have adequate lighting?  Yes Does the patient use a cane, walker or w/c? No Does the patient use of an elevated toilet seat? Yes  Timed Get Up and Go Performed: Yes. Pt ambulated 10 feet within 12 sec. Gait slow, steady and without the use of an assistive device. No intervention required at this time. Fall risk prevention has been discussed.  Community Resource Referral sent to Care Guide not required at this time. Depression Screen PHQ 2/9 Scores 12/21/2017 05/17/2017 01/25/2017 05/15/2016  PHQ - 2 Score 0 0 0 0     Cognitive Function     6CIT Screen 12/21/2017  What Year? 0 points  What month? 0 points  What time? 0 points  Count back from 20 0 points  Months in reverse 2 points  Repeat phrase 6 points  Total Score 8    Immunization History    Administered Date(s) Administered  . Influenza, High Dose Seasonal PF 09/15/2016, 07/07/2017  . Influenza-Unspecified 08/17/2014  . Pneumococcal Conjugate-13 12/05/2014  . Pneumococcal Polysaccharide-23 08/16/2007, 02/02/2013  . Tdap 02/23/2008  . Zoster 04/04/2008    Qualifies for Shingles Vaccine? Yes. Zostavax completed 04/04/08. Due for Shingrix vaccine. Education has been provided regarding the importance of this vaccine. Pt has been advised to call her insurance company to determine her out of pocket expense. Advised she may also receive this vaccine at her local pharmacy or Health Dept. Verbalized acceptance and understanding.  Screening Tests Health Maintenance  Topic Date Due  . Hepatitis C Screening  08/25/2029 (Originally January 15, 1946)  . TETANUS/TDAP  02/22/2018  . MAMMOGRAM  10/07/2018  . COLONOSCOPY  12/15/2023  . INFLUENZA VACCINE  Completed  . DEXA SCAN  Completed  . PNA vac Low Risk Adult  Completed    Cancer Screenings: Lung: Low Dose CT Chest recommended if Age 67-80 years, 30 pack-year currently smoking OR have quit w/in 15years. Patient does not qualify. NON-SMOKER Breast:  Up to date on Mammogram? Yes. Completed 10/07/17. Repeat every year.  Up to date of Bone Density/Dexa? Yes. Completed 06/08/17. Osteoporotic screenings no longer required d/t treatment of osteoporosis with Fosamax. Colorectal: Completed colonoscopy 12/14/13. Repeat every 10 years.  Additional Screenings: Hepatitis B/HIV/Syphillis: Does not qualify Hepatitis C Screening: Declined. Education has been provided regarding the importance of this screening but still declined.     I have personally reviewed and addressed the Medicare Annual Wellness questionnaire and have noted the following in the patient's chart:  A. Medical and social history B. Use of alcohol, tobacco or illicit drugs  C. Current medications and supplements D. Functional ability and status E.  Nutritional status F.  Physical  activity G. Advance directives and Code Status: H. List of other physicians I.  Hospitalizations, surgeries, and ER visits in previous 12 months J.  Vitals K. Screenings such as hearing and vision if needed, cognitive and depression L. Referrals and appointments - none  In addition, I have reviewed and discussed with patient certain preventive protocols, quality metrics, and  best practice recommendations. A written personalized care plan for preventive services as well as general preventive health recommendations were provided to patient.  See attached scanned questionnaire for additional information.   Signed,   I have reviewed this encounter including the documentation in this note and/or discussed this patient with the provider, Deon Pilling, LPN. I am certifying that I agree with the content of this note as supervising physician.  Alba Cory, MD [facility] 12/21/2017, 12:55 PM Deon Pilling, LPN Nurse Health Advisor

## 2018-01-18 ENCOUNTER — Encounter: Payer: Self-pay | Admitting: Family Medicine

## 2018-01-18 ENCOUNTER — Ambulatory Visit (INDEPENDENT_AMBULATORY_CARE_PROVIDER_SITE_OTHER): Payer: Medicare PPO | Admitting: Family Medicine

## 2018-01-18 VITALS — BP 130/80 | HR 62 | Resp 16 | Ht 67.0 in | Wt 187.7 lb

## 2018-01-18 DIAGNOSIS — J3089 Other allergic rhinitis: Secondary | ICD-10-CM

## 2018-01-18 DIAGNOSIS — G3184 Mild cognitive impairment, so stated: Secondary | ICD-10-CM

## 2018-01-18 DIAGNOSIS — K219 Gastro-esophageal reflux disease without esophagitis: Secondary | ICD-10-CM

## 2018-01-18 DIAGNOSIS — D638 Anemia in other chronic diseases classified elsewhere: Secondary | ICD-10-CM | POA: Diagnosis not present

## 2018-01-18 DIAGNOSIS — M8580 Other specified disorders of bone density and structure, unspecified site: Secondary | ICD-10-CM | POA: Diagnosis not present

## 2018-01-18 DIAGNOSIS — G4700 Insomnia, unspecified: Secondary | ICD-10-CM

## 2018-01-18 DIAGNOSIS — E782 Mixed hyperlipidemia: Secondary | ICD-10-CM | POA: Diagnosis not present

## 2018-01-18 DIAGNOSIS — Z9989 Dependence on other enabling machines and devices: Secondary | ICD-10-CM

## 2018-01-18 DIAGNOSIS — I10 Essential (primary) hypertension: Secondary | ICD-10-CM

## 2018-01-18 DIAGNOSIS — I700 Atherosclerosis of aorta: Secondary | ICD-10-CM

## 2018-01-18 DIAGNOSIS — R739 Hyperglycemia, unspecified: Secondary | ICD-10-CM

## 2018-01-18 DIAGNOSIS — I209 Angina pectoris, unspecified: Secondary | ICD-10-CM | POA: Diagnosis not present

## 2018-01-18 DIAGNOSIS — I2510 Atherosclerotic heart disease of native coronary artery without angina pectoris: Secondary | ICD-10-CM

## 2018-01-18 DIAGNOSIS — M75102 Unspecified rotator cuff tear or rupture of left shoulder, not specified as traumatic: Secondary | ICD-10-CM | POA: Diagnosis not present

## 2018-01-18 DIAGNOSIS — G4733 Obstructive sleep apnea (adult) (pediatric): Secondary | ICD-10-CM

## 2018-01-18 DIAGNOSIS — J302 Other seasonal allergic rhinitis: Secondary | ICD-10-CM

## 2018-01-18 DIAGNOSIS — I2583 Coronary atherosclerosis due to lipid rich plaque: Secondary | ICD-10-CM

## 2018-01-18 MED ORDER — ALENDRONATE SODIUM 70 MG PO TABS: ORAL_TABLET | ORAL | 3 refills | Status: DC

## 2018-01-18 MED ORDER — LIDOCAINE HCL (PF) 1 % IJ SOLN
2.0000 mL | Freq: Once | INTRAMUSCULAR | Status: AC
  Administered 2018-01-18: 2 mL

## 2018-01-18 MED ORDER — OMEPRAZOLE 40 MG PO CPDR: 40.0000 mg | DELAYED_RELEASE_CAPSULE | Freq: Every day | ORAL | 3 refills | Status: DC

## 2018-01-18 MED ORDER — FLUTICASONE PROPIONATE 50 MCG/ACT NA SUSP: NASAL | 1 refills | Status: DC

## 2018-01-18 MED ORDER — TRAZODONE HCL 50 MG PO TABS: 50.0000 mg | ORAL_TABLET | Freq: Every evening | ORAL | 0 refills | Status: DC | PRN

## 2018-01-18 MED ORDER — TRIAMCINOLONE ACETONIDE 40 MG/ML IJ SUSP
40.0000 mg | Freq: Once | INTRAMUSCULAR | Status: AC
  Administered 2018-01-18: 40 mg via INTRAMUSCULAR

## 2018-01-18 NOTE — Progress Notes (Signed)
Name: Jodi Kirby   MRN: [id number]    DOB: 1946/08/01   Date:01/18/2018       Progress Note  Subjective  Chief Complaint  Chief Complaint  Patient presents with  . Hypertension  . Gastroesophageal Reflux  . Hyperglycemia  . Hyperlipidemia    HPI  OSA: using CPAP every night, she keeps it on for about 7 hours per night.She states some nights she is taking Trazodone half to one prn and tolerating medication well without side effects.   Knee replacement surgery right: surgery was June 27 th, 2016, she has completed her PT and flexion up o 128 extension 180 degrees ( she states it is even better now - flexing more ), she is still doing her exercises at home and is back at the gym three times weekly. She is off Meloxicam , she states occasionally right is knee is locking up, she did not go back to Dr. Gavin Potters as recommend. She states it seems to be locking less often now.   HTN: she is back on one pill of Exforge HCTZ - but states it is expensive -  and bp is at goal, no chest pain or palpitation, denies side effects of medication. BP is at goal today, bp at home has been controlled most of the time at home, occasionally goes up to 160's.  Hyperlipidemia/aorta atherosclerosis : unable to tolerate statins, even in a low dose of 2.5 mg of Crestor, muscles aches were severe and she stopped medication. Dr. Mariah Milling advised Zetia and she willing to try it now.   GERD: under control with Omeprazole in am's and symptoms have been well controlled .No heartburn, no regurgitation.   Angina Pectoris: she has CAD, mild disease, seeing Dr. Mariah Milling, went to [facility] 06/2016 - last episode of chest pain, doing well now. Taking aspirin and ARB, not on beta-blocker because of bradycardia and not on statin because of intolerance, willing to try Zetia now.   Osteopenia: diagnosed in 2016 - and was started on Fosamax, she is doing well, last bone density in 2018, no side effects of medication, she  needs refills.   Vertigo: she has a history of BPPV, had Epley maneuver done in 2017  and symptoms resolved, she had another flare 07/2017 with  spinning sensation and lack of balance, sometimes associated with nausea, but no vomiting. She denies tinnitus or hearing loss. She was seen by ENT they performed another Epley maneuver and symptoms are under control again.   Hyperglycemia: she denies polyphagia, polydipsia but has nocturia which is stable, at most twice per night. We will recheck labs  Anemia chronic disease: going on for a long time, recheck levels  Mild cognitive dysfunction: found on medicare wellness, discussed referral neurologist or consider medication but she wants to hold off, we will check MMS on her next visit   Shoulder pain: she has constant pain, she has a history of rotator cuff tendinitis, had steroid injection over one year ago and it worked well, she would like to have it done again. Pain worse with abduction, initially could not internally rotate shoulder, but improved now.    Patient Active Problem List   Diagnosis Date Noted  . Mild cognitive impairment 12/21/2017  . Aortic atherosclerosis (HCC) 05/15/2017  . Hyperglycemia 11/17/2016  . Chest pain 06/27/2016  . Osteopenia 05/15/2016  . Insomnia 11/08/2015  . Angina pectoris (HCC) 07/10/2015  . Seasonal allergic rhinitis 07/09/2015  . GERD (gastroesophageal reflux disease) 07/09/2015  . Bee sting  allergy 07/09/2015  . History of knee replacement procedure of right knee 04/29/2015  . History of colonic polyps 09/24/2014  . Fibrocystic breast 09/24/2014  . Arthritis of knee, degenerative 06/12/2014  . Hyperlipidemia 02/09/2013  . Coronary artery disease 02/09/2013  . Obstructive sleep apnea on CPAP 02/09/2013  . Hypertension 02/09/2013    Past Surgical History:  Procedure Laterality Date  . ABDOMINAL HYSTERECTOMY  1970  . ANKLE SURGERY Right 1980  . BLADDER SURGERY  2012  . CARDIAC CATHETERIZATION   2003   [place]  . CARDIAC CATHETERIZATION     [place]  . COLONOSCOPY  2008, 2015   Dr. Servando [place]  . CYSTOSTOMY    . KNEE SURGERY  08/18/2011   arthroscopic right  . LUMBAR FUSION     L4-5, S-1  . NECK SURGERY  2003  . TOTAL KNEE ARTHROPLASTY Right 04/29/2015   Procedure: TOTAL KNEE ARTHROPLASTY;  Surgeon: Erin SonsHarold Kernodle, MD;  Location: [facility] ORS;  Service: Orthopedics;  Laterality: Right;  . TUBAL LIGATION      Family History  Problem Relation Age of Onset  . Kidney disease Mother   . Hypertension Mother   . CAD Father   . Healthy Sister   . Kidney cancer Neg Hx   . Bladder Cancer Neg Hx     Social History   Socioeconomic History  . Marital status: Married    Spouse name: Morris  . Number of children: 4  . Years of education: Not on file  . Highest education level: 11th grade  Social Needs  . Financial resource strain: Not hard at all  . Food insecurity - worry: Never true  . Food insecurity - inability: Never true  . Transportation needs - medical: No  . Transportation needs - non-medical: No  Occupational History  . Occupation: Retired  Tobacco Use  . Smoking status: Never Smoker  . Smokeless tobacco: Never Used  . Tobacco comment: smoking cessation materials not required  Substance and Sexual Activity  . Alcohol use: No    Alcohol/week: 0.0 oz  . Drug use: No  . Sexual activity: Not Currently    Partners: Male    Birth control/protection: None  Other Topics Concern  . Not on file  Social History Narrative  . Not on file     Current Outpatient Medications:  .  acetaminophen (TYLENOL) 650 MG CR tablet, Take 1 tablet daily as needed by mouth., Disp: , Rfl:  .  alendronate (FOSAMAX) 70 MG tablet, TAKE 1 TABLET WEEKLY, Disp: 12 tablet, Rfl: 3 .  Amlodipine-Valsartan-HCTZ 5-160-25 MG TABS, Take 1 tablet daily by mouth., Disp: 90 tablet, Rfl: 1 .  aspirin EC 81 MG tablet, Take 81 mg by mouth every morning., Disp: , Rfl:  .  Cholecalciferol (VITAMIN D3) 1000  UNITS CAPS, Take 1,000 Units by mouth every morning. , Disp: , Rfl:  .  EPINEPHrine (EPIPEN 2-PAK) 0.3 mg/0.3 mL DEVI, Inject 0.3 mg into the muscle as needed. , Disp: , Rfl:  .  fluticasone (FLONASE) 50 MCG/ACT nasal spray, USE TWO SPRAY(S) IN EACH NOSTRIL AT BEDTIME, Disp: 48 g, Rfl: 1 .  loratadine (CLARITIN) 10 MG tablet, Take 1 tablet (10 mg total) by mouth daily. (Patient taking differently: Take 10 mg by mouth daily as needed for allergies. ), Disp: 30 tablet, Rfl: 5 .  Multiple Vitamins-Minerals (CENTRUM SILVER PO), Take 1 tablet by mouth every morning. , Disp: , Rfl:  .  nitroGLYCERIN (NITROSTAT) 0.4 MG SL tablet, Place 1 tablet (  0.4 mg total) under the tongue every 5 (five) minutes as needed for chest pain., Disp: 25 tablet, Rfl: 0 .  omeprazole (PRILOSEC) 40 MG capsule, Take 1 capsule (40 mg total) by mouth daily., Disp: 90 capsule, Rfl: 3 .  traZODone (DESYREL) 50 MG tablet, Take 1 tablet (50 mg total) by mouth at bedtime as needed for sleep., Disp: 90 tablet, Rfl: 0  Allergies  Allergen Reactions  . Niaspan [Niacin Er] Other (See Comments)    Patient states she can't remember the reaction because it so long ago.  . Oxycodone-Acetaminophen Nausea Only  . Statins Other (See Comments)    Muscle and joint pain.     ROS  Constitutional: Negative for fever or weight change.  Respiratory: Negative for cough and shortness of breath.   Cardiovascular: Negative for chest pain or palpitations.  Gastrointestinal: Negative for abdominal pain, no bowel changes.  Musculoskeletal: Negative for gait problem or joint swelling.  Skin: Negative for rash.  Neurological: Negative for dizziness or headache.  No other specific complaints in a complete review of systems (except as listed in HPI above).  Objective  Vitals:   01/18/18 0759  BP: 130/80  Pulse: 62  Resp: 16  SpO2: 98%  Weight: 187 lb 11.2 oz (85.1 kg)  Height: 5\' 7"  (1.702 m)    Body mass index is 29.4 kg/m.  Physical  Exam  Constitutional: Patient appears well-developed and well-nourished. Overweight No distress.  HEENT: head atraumatic, normocephalic, pupils equal and reactive to light, neck supple, throat within normal limits Cardiovascular: Normal rate, regular rhythm and normal heart sounds.  No murmur heard. No BLE edema. Pulmonary/Chest: Effort normal and breath sounds normal. No respiratory distress. Abdominal: Soft.  There is no tenderness. Psychiatric: Patient has a normal mood and affect. behavior is normal. Judgment and thought content normal. Muscular Skeletal: pain with abduction of left shoulder, also during palpation of left shoulder, worse during palpation of left deltoid bursa.   PHQ2/9: Depression screen [facility] 2/9 12/21/2017 05/17/2017 01/25/2017 05/15/2016 01/14/2016  Decreased Interest 0 0 0 0 0  Down, Depressed, Hopeless 0 0 0 0 0  PHQ - 2 Score 0 0 0 0 0     Fall Risk: Fall Risk  01/18/2018 12/21/2017 09/20/2017 05/17/2017 01/25/2017  Falls in the past year? No No No No No    Functional Status Survey: Is the patient deaf or have difficulty hearing?: No Does the patient have difficulty seeing, even when wearing glasses/contacts?: No Does the patient have difficulty concentrating, remembering, or making decisions?: No Does the patient have difficulty walking or climbing stairs?: No Does the patient have difficulty dressing or bathing?: No Does the patient have difficulty doing errands alone such as visiting a doctor's office or shopping?: No    Assessment & Plan  1. Essential hypertension  - COMPLETE METABOLIC PANEL WITH GFR  2. Angina pectoris (HCC)  We will add zetia, continue follow up with dr. Mariah Milling   3. Osteopenia, unspecified location  - alendronate (FOSAMAX) 70 MG tablet; TAKE 1 TABLET WEEKLY  Dispense: 12 tablet; Refill: 3  4. Rotator cuff syndrome, left  Consent form signed Localized bursa - left deltoid  Area prepped with alcohol  Injection with lidocaine 1%  and Kenalog 40mg /1 ml on bursa sack Patient tolerated procedure well No side effects  5. Gastroesophageal reflux disease without esophagitis  - omeprazole (PRILOSEC) 40 MG capsule; Take 1 capsule (40 mg total) by mouth daily.  Dispense: 90 capsule; Refill: 3  6. Insomnia,  unspecified type  - traZODone (DESYREL) 50 MG tablet; Take 1 tablet (50 mg total) by mouth at bedtime as needed for sleep.  Dispense: 90 tablet; Refill: 0  7. Obstructive sleep apnea on CPAP  Continue wearing CPAP   8. Coronary artery disease due to lipid rich plaque  Add Zetia 10 mg daily   9. Anemia of chronic disease  - CBC with Differential/Platelet  10. Mild cognitive impairment  Check MMS, she does not want referral to neurologist at this time  11. Mixed hyperlipidemia  - Lipid panel  12. Hyperglycemia  - Hemoglobin A1c  13. Perennial allergic rhinitis with seasonal variation  - fluticasone (FLONASE) 50 MCG/ACT nasal spray; USE TWO SPRAY(S) IN EACH NOSTRIL AT BEDTIME  Dispense: 48 g; Refill: 1

## 2018-01-18 NOTE — Patient Instructions (Addendum)
Call [facility] to find out if it is cheaper to get  Norvasc ( amlodipine ) and Valsartan HCTZ instead of Exforge    Shoulder Exercises Ask your health care provider which exercises are safe for you. Do exercises exactly as told by your health care provider and adjust them as directed. It is normal to feel mild stretching, pulling, tightness, or discomfort as you do these exercises, but you should stop right away if you feel sudden pain or your pain gets worse.Do not begin these exercises until told by your health care provider. RANGE OF MOTION EXERCISES These exercises warm up your muscles and joints and improve the movement and flexibility of your shoulder. These exercises also help to relieve pain, numbness, and tingling. These exercises involve stretching your injured shoulder directly. Exercise A: Pendulum  1. Stand near a wall or a surface that you can hold onto for balance. 2. Bend at the waist and let your left / right arm hang straight down. Use your other arm to support you. Keep your back straight and do not lock your knees. 3. Relax your left / right arm and shoulder muscles, and move your hips and your trunk so your left / right arm swings freely. Your arm should swing because of the motion of your body, not because you are using your arm or shoulder muscles. 4. Keep moving your body so your arm swings in the following directions, as told by your health care provider: ? Side to side. ? Forward and backward. ? In clockwise and counterclockwise circles. 5. Continue each motion for __________ seconds, or for as long as told by your health care provider. 6. Slowly return to the starting position. Repeat __________ times. Complete this exercise __________ times a day. Exercise B:Flexion, Standing  1. Stand and hold a broomstick, a cane, or a similar object. Place your hands a little more than shoulder-width apart on the object. Your left / right hand should be palm-up, and your  other hand should be palm-down. 2. Keep your elbow straight and keep your shoulder muscles relaxed. Push the stick down with your healthy arm to raise your left / right arm in front of your body, and then over your head until you feel a stretch in your shoulder. ? Avoid shrugging your shoulder while you raise your arm. Keep your shoulder blade tucked down toward the middle of your back. 3. Hold for __________ seconds. 4. Slowly return to the starting position. Repeat __________ times. Complete this exercise __________ times a day. Exercise C: Abduction, Standing 1. Stand and hold a broomstick, a cane, or a similar object. Place your hands a little more than shoulder-width apart on the object. Your left / right hand should be palm-up, and your other hand should be palm-down. 2. While keeping your elbow straight and your shoulder muscles relaxed, push the stick across your body toward your left / right side. Raise your left / right arm to the side of your body and then over your head until you feel a stretch in your shoulder. ? Do not raise your arm above shoulder height, unless your health care provider tells you to do that. ? Avoid shrugging your shoulder while you raise your arm. Keep your shoulder blade tucked down toward the middle of your back. 3. Hold for __________ seconds. 4. Slowly return to the starting position. Repeat __________ times. Complete this exercise __________ times a day. Exercise D:Internal Rotation  1. Place your left / right hand behind  your back, palm-up. 2. Use your other hand to dangle an exercise band, a towel, or a similar object over your shoulder. Grasp the band with your left / right hand so you are holding onto both ends. 3. Gently pull up on the band until you feel a stretch in the front of your left / right shoulder. ? Avoid shrugging your shoulder while you raise your arm. Keep your shoulder blade tucked down toward the middle of your back. 4. Hold for  __________ seconds. 5. Release the stretch by letting go of the band and lowering your hands. Repeat __________ times. Complete this exercise __________ times a day. STRETCHING EXERCISES These exercises warm up your muscles and joints and improve the movement and flexibility of your shoulder. These exercises also help to relieve pain, numbness, and tingling. These exercises are done using your healthy shoulder to help stretch the muscles of your injured shoulder. Exercise E: Research officer, political party (External Rotation and Abduction)  1. Stand in a doorway with one of your feet slightly in front of the other. This is called a staggered stance. If you cannot reach your forearms to the door frame, stand facing a corner of a room. 2. Choose one of the following positions as told by your health care provider: ? Place your hands and forearms on the door frame above your head. ? Place your hands and forearms on the door frame at the height of your head. ? Place your hands on the door frame at the height of your elbows. 3. Slowly move your weight onto your front foot until you feel a stretch across your chest and in the front of your shoulders. Keep your head and chest upright and keep your abdominal muscles tight. 4. Hold for __________ seconds. 5. To release the stretch, shift your weight to your back foot. Repeat __________ times. Complete this stretch __________ times a day. Exercise F:Extension, Standing 1. Stand and hold a broomstick, a cane, or a similar object behind your back. ? Your hands should be a little wider than shoulder-width apart. ? Your palms should face away from your back. 2. Keeping your elbows straight and keeping your shoulder muscles relaxed, move the stick away from your body until you feel a stretch in your shoulder. ? Avoid shrugging your shoulders while you move the stick. Keep your shoulder blade tucked down toward the middle of your back. 3. Hold for __________  seconds. 4. Slowly return to the starting position. Repeat __________ times. Complete this exercise __________ times a day. STRENGTHENING EXERCISES These exercises build strength and endurance in your shoulder. Endurance is the ability to use your muscles for a long time, even after they get tired. Exercise G:External Rotation  1. Sit in a stable chair without armrests. 2. Secure an exercise band at elbow height on your left / right side. 3. Place a soft object, such as a folded towel or a small pillow, between your left / right upper arm and your body to move your elbow a few inches away (about 10 cm) from your side. 4. Hold the end of the band so it is tight and there is no slack. 5. Keeping your elbow pressed against the soft object, move your left / right forearm out, away from your abdomen. Keep your body steady so only your forearm moves. 6. Hold for __________ seconds. 7. Slowly return to the starting position. Repeat __________ times. Complete this exercise __________ times a day. Exercise H:Shoulder Abduction  1. Sit in  a stable chair without armrests, or stand. 2. Hold a __________ weight in your left / right hand, or hold an exercise band with both hands. 3. Start with your arms straight down and your left / right palm facing in, toward your body. 4. Slowly lift your left / right hand out to your side. Do not lift your hand above shoulder height unless your health care provider tells you that this is safe. ? Keep your arms straight. ? Avoid shrugging your shoulder while you do this movement. Keep your shoulder blade tucked down toward the middle of your back. 5. Hold for __________ seconds. 6. Slowly lower your arm, and return to the starting position. Repeat __________ times. Complete this exercise __________ times a day. Exercise I:Shoulder Extension 1. Sit in a stable chair without armrests, or stand. 2. Secure an exercise band to a stable object in front of you where it  is at shoulder height. 3. Hold one end of the exercise band in each hand. Your palms should face each other. 4. Straighten your elbows and lift your hands up to shoulder height. 5. Step back, away from the secured end of the exercise band, until the band is tight and there is no slack. 6. Squeeze your shoulder blades together as you pull your hands down to the sides of your thighs. Stop when your hands are straight down by your sides. Do not let your hands go behind your body. 7. Hold for __________ seconds. 8. Slowly return to the starting position. Repeat __________ times. Complete this exercise __________ times a day. Exercise J:Standing Shoulder Row 1. Sit in a stable chair without armrests, or stand. 2. Secure an exercise band to a stable object in front of you so it is at waist height. 3. Hold one end of the exercise band in each hand. Your palms should be in a thumbs-up position. 4. Bend each of your elbows to an "L" shape (about 90 degrees) and keep your upper arms at your sides. 5. Step back until the band is tight and there is no slack. 6. Slowly pull your elbows back behind you. 7. Hold for __________ seconds. 8. Slowly return to the starting position. Repeat __________ times. Complete this exercise __________ times a day. Exercise K:Shoulder Press-Ups  1. Sit in a stable chair that has armrests. Sit upright, with your feet flat on the floor. 2. Put your hands on the armrests so your elbows are bent and your fingers are pointing forward. Your hands should be about even with the sides of your body. 3. Push down on the armrests and use your arms to lift yourself off of the chair. Straighten your elbows and lift yourself up as much as you comfortably can. ? Move your shoulder blades down, and avoid letting your shoulders move up toward your ears. ? Keep your feet on the ground. As you get stronger, your feet should support less of your body weight as you lift yourself up. 4. Hold  for __________ seconds. 5. Slowly lower yourself back into the chair. Repeat __________ times. Complete this exercise __________ times a day. Exercise L: Wall Push-Ups  1. Stand so you are facing a stable wall. Your feet should be about one arm-length away from the wall. 2. Lean forward and place your palms on the wall at shoulder height. 3. Keep your feet flat on the floor as you bend your elbows and lean forward toward the wall. 4. Hold for __________ seconds. 5. Straighten your elbows  to push yourself back to the starting position. Repeat __________ times. Complete this exercise __________ times a day. This information is not intended to replace advice given to you by your health care provider. Make sure you discuss any questions you have with your health care provider. Document Released: 09/02/2005 Document Revised: 07/13/2016 Document Reviewed: 06/30/2015 Elsevier Interactive Patient Education  2018 [employer].

## 2018-01-19 ENCOUNTER — Other Ambulatory Visit: Payer: Self-pay | Admitting: Family Medicine

## 2018-01-19 LAB — LIPID PANEL
CHOLESTEROL: 233 mg/dL — AB (ref <200)
HDL: 45 mg/dL — AB (ref >50)
LDL Cholesterol (Calc): 145 mg/dL (calc) — ABNORMAL HIGH
Non-HDL Cholesterol (Calc): 188 mg/dL (calc) — ABNORMAL HIGH (ref <130)
TRIGLYCERIDES: 282 mg/dL — AB (ref <150)
Total CHOL/HDL Ratio: 5.2 (calc) — ABNORMAL HIGH (ref <5.0)

## 2018-01-19 LAB — COMPLETE METABOLIC PANEL WITH GFR
AG RATIO: 1.8 (calc) (ref 1.0–2.5)
ALKALINE PHOSPHATASE (APISO): 86 U/L (ref 33–130)
ALT: 20 U/L (ref 6–29)
AST: 20 U/L (ref 10–35)
Albumin: 4.5 g/dL (ref 3.6–5.1)
BUN/Creatinine Ratio: 12 (calc) (ref 6–22)
BUN: 13 mg/dL (ref 7–25)
CALCIUM: 9.8 mg/dL (ref 8.6–10.4)
CHLORIDE: 104 mmol/L (ref 98–110)
CO2: 32 mmol/L (ref 20–32)
Creat: 1.08 mg/dL — ABNORMAL HIGH (ref 0.60–0.93)
GFR, Est African American: 59 mL/min/{1.73_m2} — ABNORMAL LOW (ref >=60)
GFR, Est Non African American: 51 mL/min/{1.73_m2} — ABNORMAL LOW (ref >=60)
GLOBULIN: 2.5 g/dL (ref 1.9–3.7)
Glucose, Bld: 105 mg/dL — ABNORMAL HIGH (ref 65–99)
POTASSIUM: 3.8 mmol/L (ref 3.5–5.3)
SODIUM: 142 mmol/L (ref 135–146)
Total Bilirubin: 0.4 mg/dL (ref 0.2–1.2)
Total Protein: 7 g/dL (ref 6.1–8.1)

## 2018-01-19 LAB — CBC WITH DIFFERENTIAL/PLATELET
BASOS ABS: 33 {cells}/uL (ref 0–200)
Basophils Relative: 0.5 %
EOS ABS: 59 {cells}/uL (ref 15–500)
Eosinophils Relative: 0.9 %
HEMATOCRIT: 32.3 % — AB (ref 35.0–45.0)
HEMOGLOBIN: 11 g/dL — AB (ref 11.7–15.5)
LYMPHS ABS: 1518 {cells}/uL (ref 850–3900)
MCH: 28.4 pg (ref 27.0–33.0)
MCHC: 34.1 g/dL (ref 32.0–36.0)
MCV: 83.2 fL (ref 80.0–100.0)
MPV: 11.1 fL (ref 7.5–12.5)
Monocytes Relative: 7.1 %
NEUTROS ABS: 4521 {cells}/uL (ref 1500–7800)
NEUTROS PCT: 68.5 %
Platelets: 269 10*3/uL (ref 140–400)
RBC: 3.88 10*6/uL (ref 3.80–5.10)
RDW: 12.5 % (ref 11.0–15.0)
Total Lymphocyte: 23 %
WBC: 6.6 10*3/uL (ref 3.8–10.8)
WBCMIX: 469 {cells}/uL (ref 200–950)

## 2018-01-19 LAB — HEMOGLOBIN A1C
HEMOGLOBIN A1C: 5.5 %{Hb} (ref <5.7)
Mean Plasma Glucose: 111 (calc)
eAG (mmol/L): 6.2 (calc)

## 2018-01-19 MED ORDER — EZETIMIBE 10 MG PO TABS: 10.0000 mg | ORAL_TABLET | Freq: Every day | ORAL | 1 refills | Status: DC

## 2018-01-28 DIAGNOSIS — H2511 Age-related nuclear cataract, right eye: Secondary | ICD-10-CM | POA: Diagnosis not present

## 2018-01-28 DIAGNOSIS — G4733 Obstructive sleep apnea (adult) (pediatric): Secondary | ICD-10-CM | POA: Diagnosis not present

## 2018-02-02 ENCOUNTER — Encounter: Payer: Self-pay | Admitting: *Deleted

## 2018-02-02 ENCOUNTER — Other Ambulatory Visit: Payer: Self-pay

## 2018-02-02 NOTE — Discharge Instructions (Signed)

## 2018-02-09 ENCOUNTER — Ambulatory Visit
Admission: RE | Admit: 2018-02-09 | Discharge: 2018-02-09 | Disposition: A | Discharge status: Home / Self Care | Payer: Medicare PPO | Source: Ambulatory Visit | Attending: Ophthalmology | Admitting: Ophthalmology

## 2018-02-09 ENCOUNTER — Ambulatory Visit: Payer: Medicare PPO | Admitting: Anesthesiology

## 2018-02-09 ENCOUNTER — Encounter
Admission: RE | Disposition: A | Discharge status: Home / Self Care | Payer: Self-pay | Source: Ambulatory Visit | Attending: Ophthalmology

## 2018-02-09 DIAGNOSIS — I2510 Atherosclerotic heart disease of native coronary artery without angina pectoris: Secondary | ICD-10-CM | POA: Insufficient documentation

## 2018-02-09 DIAGNOSIS — G4730 Sleep apnea, unspecified: Secondary | ICD-10-CM | POA: Insufficient documentation

## 2018-02-09 DIAGNOSIS — Z981 Arthrodesis status: Secondary | ICD-10-CM | POA: Insufficient documentation

## 2018-02-09 DIAGNOSIS — Z9989 Dependence on other enabling machines and devices: Secondary | ICD-10-CM | POA: Diagnosis not present

## 2018-02-09 DIAGNOSIS — K219 Gastro-esophageal reflux disease without esophagitis: Secondary | ICD-10-CM | POA: Insufficient documentation

## 2018-02-09 DIAGNOSIS — E7800 Pure hypercholesterolemia, unspecified: Secondary | ICD-10-CM | POA: Diagnosis not present

## 2018-02-09 DIAGNOSIS — I252 Old myocardial infarction: Secondary | ICD-10-CM | POA: Insufficient documentation

## 2018-02-09 DIAGNOSIS — M810 Age-related osteoporosis without current pathological fracture: Secondary | ICD-10-CM | POA: Insufficient documentation

## 2018-02-09 DIAGNOSIS — Z96651 Presence of right artificial knee joint: Secondary | ICD-10-CM | POA: Diagnosis not present

## 2018-02-09 DIAGNOSIS — H2511 Age-related nuclear cataract, right eye: Secondary | ICD-10-CM | POA: Diagnosis not present

## 2018-02-09 DIAGNOSIS — H25811 Combined forms of age-related cataract, right eye: Secondary | ICD-10-CM | POA: Diagnosis not present

## 2018-02-09 DIAGNOSIS — J45909 Unspecified asthma, uncomplicated: Secondary | ICD-10-CM | POA: Diagnosis not present

## 2018-02-09 DIAGNOSIS — I10 Essential (primary) hypertension: Secondary | ICD-10-CM | POA: Insufficient documentation

## 2018-02-09 HISTORY — PX: CATARACT EXTRACTION W/PHACO: SHX586

## 2018-02-09 HISTORY — DX: Dizziness and giddiness: R42

## 2018-02-09 SURGERY — PHACOEMULSIFICATION, CATARACT, WITH IOL INSERTION
Anesthesia: Monitor Anesthesia Care | Laterality: Right | Wound class: "Clean "

## 2018-02-09 MED ORDER — LACTATED RINGERS IV SOLN: 10.0000 mL/h | INTRAVENOUS | Status: DC

## 2018-02-09 MED ORDER — FENTANYL CITRATE (PF) 100 MCG/2ML IJ SOLN
INTRAMUSCULAR | Status: DC | PRN
  Administered 2018-02-09: 50 ug via INTRAVENOUS

## 2018-02-09 MED ORDER — MOXIFLOXACIN HCL 0.5 % OP SOLN
1.0000 [drp] | OPHTHALMIC | Status: DC | PRN
  Administered 2018-02-09 (×3): 1 [drp] via OPHTHALMIC

## 2018-02-09 MED ORDER — EPINEPHRINE PF 1 MG/ML IJ SOLN
INTRAOCULAR | Status: DC | PRN
  Administered 2018-02-09: 77 mL via OPHTHALMIC

## 2018-02-09 MED ORDER — CEFUROXIME OPHTHALMIC INJECTION 1 MG/0.1 ML
INJECTION | OPHTHALMIC | Status: DC | PRN
  Administered 2018-02-09: 0.1 mL via INTRACAMERAL

## 2018-02-09 MED ORDER — NA HYALUR & NA CHOND-NA HYALUR 0.4-0.35 ML IO KIT
PACK | INTRAOCULAR | Status: DC | PRN
  Administered 2018-02-09: 1 mL via INTRAOCULAR

## 2018-02-09 MED ORDER — ARMC OPHTHALMIC DILATING DROPS
1.0000 "application " | OPHTHALMIC | Status: DC | PRN
  Administered 2018-02-09 (×3): 1 via OPHTHALMIC

## 2018-02-09 MED ORDER — LIDOCAINE HCL (PF) 2 % IJ SOLN
INTRAOCULAR | Status: DC | PRN
  Administered 2018-02-09: 1 mL via INTRAMUSCULAR

## 2018-02-09 MED ORDER — MIDAZOLAM HCL 2 MG/2ML IJ SOLN
INTRAMUSCULAR | Status: DC | PRN
  Administered 2018-02-09: 2 mg via INTRAVENOUS

## 2018-02-09 MED ORDER — BRIMONIDINE TARTRATE-TIMOLOL 0.2-0.5 % OP SOLN
OPHTHALMIC | Status: DC | PRN
  Administered 2018-02-09: 1 [drp] via OPHTHALMIC

## 2018-02-09 SURGICAL SUPPLY — 27 items
CANNULA ANT/CHMB 27G (MISCELLANEOUS) ×1 IMPLANT
CANNULA ANT/CHMB 27GA (MISCELLANEOUS) ×3 IMPLANT
CARTRIDGE ABBOTT (MISCELLANEOUS) IMPLANT
GLOVE SURG LX 7.5 STRW (GLOVE) ×2
GLOVE SURG LX STRL 7.5 STRW (GLOVE) ×1 IMPLANT
GLOVE SURG TRIUMPH 8.0 PF LTX (GLOVE) ×3 IMPLANT
GOWN STRL REUS W/ TWL LRG LVL3 (GOWN DISPOSABLE) ×2 IMPLANT
GOWN STRL REUS W/TWL LRG LVL3 (GOWN DISPOSABLE) ×4
LENS IOL TECNIS ITEC 21.0 (Intraocular Lens) ×2 IMPLANT
MARKER SKIN DUAL TIP RULER LAB (MISCELLANEOUS) ×3 IMPLANT
NDL FILTER BLUNT 18X1 1/2 (NEEDLE) ×1 IMPLANT
NDL RETROBULBAR .5 NSTRL (NEEDLE) IMPLANT
NEEDLE FILTER BLUNT 18X 1/2SAF (NEEDLE) ×2
NEEDLE FILTER BLUNT 18X1 1/2 (NEEDLE) ×1 IMPLANT
PACK CATARACT BRASINGTON (MISCELLANEOUS) ×3 IMPLANT
PACK EYE AFTER SURG (MISCELLANEOUS) ×3 IMPLANT
PACK OPTHALMIC (MISCELLANEOUS) ×3 IMPLANT
RING MALYGIN 7.0 (MISCELLANEOUS) IMPLANT
SUT ETHILON 10-0 CS-B-6CS-B-6 (SUTURE)
SUT VICRYL  9 0 (SUTURE)
SUT VICRYL 9 0 (SUTURE) IMPLANT
SUTURE EHLN 10-0 CS-B-6CS-B-6 (SUTURE) IMPLANT
SYR 3ML LL SCALE MARK (SYRINGE) ×3 IMPLANT
SYR 5ML LL (SYRINGE) ×3 IMPLANT
SYR TB 1ML LUER SLIP (SYRINGE) ×3 IMPLANT
WATER STERILE IRR 500ML POUR (IV SOLUTION) ×3 IMPLANT
WIPE NON LINTING 3.25X3.25 (MISCELLANEOUS) ×3 IMPLANT

## 2018-02-09 NOTE — Anesthesia Procedure Notes (Signed)
Procedure Name: MAC Performed by: Yi Haugan, CRNA Pre-anesthesia Checklist: Patient identified, Emergency Drugs available, Suction available, Timeout performed and Patient being monitored Patient Re-evaluated:Patient Re-evaluated prior to induction Oxygen Delivery Method: Nasal cannula Placement Confirmation: positive ETCO2       

## 2018-02-09 NOTE — Anesthesia Postprocedure Evaluation (Signed)
Anesthesia Post Note  Patient: Jodi Kirby  Procedure(s) Performed: CATARACT EXTRACTION PHACO AND INTRAOCULAR LENS PLACEMENT (IOC) RIGHT (Right )  Patient location during evaluation: PACU Anesthesia Type: MAC Level of consciousness: awake and alert Pain management: pain level controlled Vital Signs Assessment: post-procedure vital signs reviewed and stable Respiratory status: spontaneous breathing, nonlabored ventilation, respiratory function stable and patient connected to nasal cannula oxygen Cardiovascular status: stable and blood pressure returned to baseline Postop Assessment: no apparent nausea or vomiting Anesthetic complications: no    Thamar Holik

## 2018-02-09 NOTE — Transfer of Care (Signed)
Immediate Anesthesia Transfer of Care Note  Patient: Jodi Kirby  Procedure(s) Performed: CATARACT EXTRACTION PHACO AND INTRAOCULAR LENS PLACEMENT (IOC) RIGHT (Right )  Patient Location: PACU  Anesthesia Type: MAC  Level of Consciousness: awake, alert  and patient cooperative  Airway and Oxygen Therapy: Patient Spontanous Breathing and Patient connected to supplemental oxygen  Post-op Assessment: Post-op Vital signs reviewed, Patient's Cardiovascular Status Stable, Respiratory Function Stable, Patent Airway and No signs of Nausea or vomiting  Post-op Vital Signs: Reviewed and stable  Complications: No apparent anesthesia complications

## 2018-02-09 NOTE — H&P (Signed)
The History and Physical notes are on paper, have been signed, and are to be scanned. The patient remains stable and unchanged from the H&P.   Previous H&P reviewed, patient examined, and there are no changes.  Jodi Kirby 02/09/2018 10:09 AM   

## 2018-02-09 NOTE — Anesthesia Preprocedure Evaluation (Signed)
Anesthesia Evaluation  Patient identified by MRN, date of birth, ID band  Reviewed: NPO status   History of Anesthesia Complications Negative for: history of anesthetic complications  Airway Mallampati: II  TM Distance: >3 FB Neck ROM: full    Dental no notable dental hx.    Pulmonary neg pulmonary ROS, sleep apnea and Continuous Positive Airway Pressure Ventilation ,    Pulmonary exam normal        Cardiovascular Exercise Tolerance: Good hypertension, + angina (atypical) + CAD (mild)  negative cardio ROS Normal cardiovascular exam  ekg: sr with pacs;   cards stable: 12/2016: dr. Rockey Situ;  catheterization in 2003 with 20% disease in her RCA and LAD;  Exercise stress Myoview test 07/29/2016 showing no ischemia, Ejection fraction 62%      Neuro/Psych negative neurological ROS  negative psych ROS   GI/Hepatic Neg liver ROS, GERD  Controlled,  Endo/Other  negative endocrine ROS  Renal/GU negative Renal ROS  negative genitourinary   Musculoskeletal   Abdominal   Peds  Hematology negative hematology ROS (+)   Anesthesia Other Findings   Reproductive/Obstetrics                             Anesthesia Physical Anesthesia Plan  ASA: III  Anesthesia Plan: MAC   Post-op Pain Management:    Induction:   PONV Risk Score and Plan:   Airway Management Planned:   Additional Equipment:   Intra-op Plan:   Post-operative Plan:   Informed Consent: I have reviewed the patients History and Physical, chart, labs and discussed the procedure including the risks, benefits and alternatives for the proposed anesthesia with the patient or authorized representative who has indicated his/her understanding and acceptance.     Plan Discussed with: CRNA  Anesthesia Plan Comments:         Anesthesia Quick Evaluation

## 2018-02-09 NOTE — Op Note (Signed)
LOCATION:  [facility]   PREOPERATIVE DIAGNOSIS:    Nuclear sclerotic cataract right eye. H25.11   POSTOPERATIVE DIAGNOSIS:  Nuclear sclerotic cataract right eye.     PROCEDURE:  Phacoemusification with posterior chamber intraocular lens placement of the right eye   LENS:  PCB00 21.0 D PCIOL   ULTRASOUND TIME: 10 % of 1 minutes, 36 seconds.  CDE 9.6   SURGEON:  Deirdre Evenerhadwick R. Zamiyah Resendes, MD   ANESTHESIA:  Topical with tetracaine drops and 2% Xylocaine jelly, augmented with 1% preservative-free intracameral lidocaine.    COMPLICATIONS:  None.   DESCRIPTION OF PROCEDURE:  The patient was identified in the holding room and transported to the operating room and placed in the supine position under the operating microscope.  The right eye was identified as the operative eye and it was prepped and draped in the usual sterile ophthalmic fashion.   A 1 millimeter clear-corneal paracentesis was made at the 12:00 position.  0.5 ml of preservative-free 1% lidocaine was injected into the anterior chamber. The anterior chamber was filled with Viscoat viscoelastic.  A 2.4 millimeter keratome was used to make a near-clear corneal incision at the 9:00 position.  A curvilinear capsulorrhexis was made with a cystotome and capsulorrhexis forceps.  Balanced salt solution was used to hydrodissect and hydrodelineate the nucleus.   Phacoemulsification was then used in stop and chop fashion to remove the lens nucleus and epinucleus.  The remaining cortex was then removed using the irrigation and aspiration handpiece. Provisc was then placed into the capsular bag to distend it for lens placement.  A lens was then injected into the capsular bag.  The remaining viscoelastic was aspirated.   Wounds were hydrated with balanced salt solution.  The anterior chamber was inflated to a physiologic pressure with balanced salt solution.  No wound leaks were noted. Cefuroxime 0.1 ml of a 10mg /ml solution was injected  into the anterior chamber for a dose of 1 mg of intracameral antibiotic at the completion of the case.   Timolol and Brimonidine drops were applied to the eye.  The patient was taken to the recovery room in stable condition without complications of anesthesia or surgery.   Jakyrie Totherow 02/09/2018, 10:57 AM

## 2018-02-10 ENCOUNTER — Encounter: Payer: Self-pay | Admitting: Ophthalmology

## 2018-03-01 DIAGNOSIS — H2512 Age-related nuclear cataract, left eye: Secondary | ICD-10-CM | POA: Diagnosis not present

## 2018-03-07 NOTE — Discharge Instructions (Signed)

## 2018-03-09 ENCOUNTER — Encounter
Admission: RE | Disposition: A | Discharge status: Home / Self Care | Payer: Self-pay | Source: Ambulatory Visit | Attending: Ophthalmology

## 2018-03-09 ENCOUNTER — Ambulatory Visit: Payer: Medicare PPO | Admitting: Anesthesiology

## 2018-03-09 ENCOUNTER — Ambulatory Visit
Admission: RE | Admit: 2018-03-09 | Discharge: 2018-03-09 | Disposition: A | Discharge status: Home / Self Care | Payer: Medicare PPO | Source: Ambulatory Visit | Attending: Ophthalmology | Admitting: Ophthalmology

## 2018-03-09 DIAGNOSIS — I252 Old myocardial infarction: Secondary | ICD-10-CM | POA: Insufficient documentation

## 2018-03-09 DIAGNOSIS — K219 Gastro-esophageal reflux disease without esophagitis: Secondary | ICD-10-CM | POA: Insufficient documentation

## 2018-03-09 DIAGNOSIS — J45909 Unspecified asthma, uncomplicated: Secondary | ICD-10-CM | POA: Diagnosis not present

## 2018-03-09 DIAGNOSIS — Z7982 Long term (current) use of aspirin: Secondary | ICD-10-CM | POA: Diagnosis not present

## 2018-03-09 DIAGNOSIS — E7800 Pure hypercholesterolemia, unspecified: Secondary | ICD-10-CM | POA: Insufficient documentation

## 2018-03-09 DIAGNOSIS — H2512 Age-related nuclear cataract, left eye: Secondary | ICD-10-CM | POA: Insufficient documentation

## 2018-03-09 DIAGNOSIS — I10 Essential (primary) hypertension: Secondary | ICD-10-CM | POA: Diagnosis not present

## 2018-03-09 DIAGNOSIS — M810 Age-related osteoporosis without current pathological fracture: Secondary | ICD-10-CM | POA: Diagnosis not present

## 2018-03-09 DIAGNOSIS — G4730 Sleep apnea, unspecified: Secondary | ICD-10-CM | POA: Diagnosis not present

## 2018-03-09 DIAGNOSIS — H25812 Combined forms of age-related cataract, left eye: Secondary | ICD-10-CM | POA: Diagnosis not present

## 2018-03-09 DIAGNOSIS — Z981 Arthrodesis status: Secondary | ICD-10-CM | POA: Diagnosis not present

## 2018-03-09 DIAGNOSIS — Z79899 Other long term (current) drug therapy: Secondary | ICD-10-CM | POA: Insufficient documentation

## 2018-03-09 DIAGNOSIS — Z9989 Dependence on other enabling machines and devices: Secondary | ICD-10-CM | POA: Diagnosis not present

## 2018-03-09 HISTORY — PX: CATARACT EXTRACTION W/PHACO: SHX586

## 2018-03-09 SURGERY — PHACOEMULSIFICATION, CATARACT, WITH IOL INSERTION
Anesthesia: Monitor Anesthesia Care | Site: Eye | Laterality: Left | Wound class: "Clean "

## 2018-03-09 MED ORDER — LACTATED RINGERS IV SOLN: INTRAVENOUS | Status: DC

## 2018-03-09 MED ORDER — MIDAZOLAM HCL 2 MG/2ML IJ SOLN
INTRAMUSCULAR | Status: DC | PRN
  Administered 2018-03-09: 2 mg via INTRAVENOUS

## 2018-03-09 MED ORDER — MOXIFLOXACIN HCL 0.5 % OP SOLN
1.0000 [drp] | OPHTHALMIC | Status: DC | PRN
  Administered 2018-03-09 (×3): 1 [drp] via OPHTHALMIC

## 2018-03-09 MED ORDER — CEFUROXIME OPHTHALMIC INJECTION 1 MG/0.1 ML
INJECTION | OPHTHALMIC | Status: DC | PRN
  Administered 2018-03-09: 0.1 mL via INTRACAMERAL

## 2018-03-09 MED ORDER — FENTANYL CITRATE (PF) 100 MCG/2ML IJ SOLN
INTRAMUSCULAR | Status: DC | PRN
  Administered 2018-03-09: 50 ug via INTRAVENOUS

## 2018-03-09 MED ORDER — BRIMONIDINE TARTRATE-TIMOLOL 0.2-0.5 % OP SOLN
OPHTHALMIC | Status: DC | PRN
  Administered 2018-03-09: 1 [drp] via OPHTHALMIC

## 2018-03-09 MED ORDER — ARMC OPHTHALMIC DILATING DROPS
1.0000 "application " | OPHTHALMIC | Status: DC | PRN
  Administered 2018-03-09 (×3): 1 via OPHTHALMIC

## 2018-03-09 MED ORDER — EPINEPHRINE PF 1 MG/ML IJ SOLN
INTRAOCULAR | Status: DC | PRN
  Administered 2018-03-09: 58 mL via OPHTHALMIC

## 2018-03-09 MED ORDER — LIDOCAINE HCL (PF) 2 % IJ SOLN
INTRAOCULAR | Status: DC | PRN
  Administered 2018-03-09: 2 mL

## 2018-03-09 SURGICAL SUPPLY — 20 items
CANNULA ANT/CHMB 27G (MISCELLANEOUS) ×1 IMPLANT
CANNULA ANT/CHMB 27GA (MISCELLANEOUS) ×3 IMPLANT
GLOVE SURG LX 7.5 STRW (GLOVE) ×2
GLOVE SURG LX STRL 7.5 STRW (GLOVE) ×1 IMPLANT
GLOVE SURG TRIUMPH 8.0 PF LTX (GLOVE) ×3 IMPLANT
GOWN STRL REUS W/ TWL LRG LVL3 (GOWN DISPOSABLE) ×2 IMPLANT
GOWN STRL REUS W/TWL LRG LVL3 (GOWN DISPOSABLE) ×4
LENS IOL TECNIS ITEC 21.0 (Intraocular Lens) ×2 IMPLANT
MARKER SKIN DUAL TIP RULER LAB (MISCELLANEOUS) ×3 IMPLANT
NDL FILTER BLUNT 18X1 1/2 (NEEDLE) ×1 IMPLANT
NEEDLE FILTER BLUNT 18X 1/2SAF (NEEDLE) ×2
NEEDLE FILTER BLUNT 18X1 1/2 (NEEDLE) ×1 IMPLANT
PACK CATARACT BRASINGTON (MISCELLANEOUS) ×3 IMPLANT
PACK EYE AFTER SURG (MISCELLANEOUS) ×3 IMPLANT
PACK OPTHALMIC (MISCELLANEOUS) ×3 IMPLANT
SYR 3ML LL SCALE MARK (SYRINGE) ×3 IMPLANT
SYR 5ML LL (SYRINGE) ×3 IMPLANT
SYR TB 1ML LUER SLIP (SYRINGE) ×3 IMPLANT
WATER STERILE IRR 500ML POUR (IV SOLUTION) ×3 IMPLANT
WIPE NON LINTING 3.25X3.25 (MISCELLANEOUS) ×3 IMPLANT

## 2018-03-09 NOTE — Anesthesia Preprocedure Evaluation (Signed)
Anesthesia Evaluation  Patient identified by MRN, date of birth, ID band Patient awake    Reviewed: Allergy & Precautions, H&P , NPO status , Patient's Chart, lab work & pertinent test results  Airway Mallampati: II  TM Distance: >3 FB Neck ROM: full    Dental no notable dental hx.    Pulmonary sleep apnea ,    Pulmonary exam normal        Cardiovascular hypertension, On Medications Normal cardiovascular exam     Neuro/Psych    GI/Hepatic Neg liver ROS, Medicated,  Endo/Other  negative endocrine ROS  Renal/GU negative Renal ROS     Musculoskeletal   Abdominal   Peds  Hematology negative hematology ROS (+)   Anesthesia Other Findings   Reproductive/Obstetrics negative OB ROS                             Anesthesia Physical Anesthesia Plan  ASA: II  Anesthesia Plan: MAC   Post-op Pain Management:    Induction:   PONV Risk Score and Plan:   Airway Management Planned:   Additional Equipment:   Intra-op Plan:   Post-operative Plan:   Informed Consent: I have reviewed the patients History and Physical, chart, labs and discussed the procedure including the risks, benefits and alternatives for the proposed anesthesia with the patient or authorized representative who has indicated his/her understanding and acceptance.     Plan Discussed with:   Anesthesia Plan Comments:         Anesthesia Quick Evaluation

## 2018-03-09 NOTE — Transfer of Care (Signed)
Immediate Anesthesia Transfer of Care Note  Patient: Jodi Kirby  Procedure(s) Performed: CATARACT EXTRACTION PHACO AND INTRAOCULAR LENS PLACEMENT (IOC) (Left Eye)  Patient Location: PACU  Anesthesia Type: MAC  Level of Consciousness: awake, alert  and patient cooperative  Airway and Oxygen Therapy: Patient Spontanous Breathing and Patient connected to supplemental oxygen  Post-op Assessment: Post-op Vital signs reviewed, Patient's Cardiovascular Status Stable, Respiratory Function Stable, Patent Airway and No signs of Nausea or vomiting  Post-op Vital Signs: Reviewed and stable  Complications: No apparent anesthesia complications

## 2018-03-09 NOTE — Anesthesia Procedure Notes (Signed)
Procedure Name: MAC Date/Time: 03/09/2018 8:54 AM Performed by: Lind Guest, CRNA Pre-anesthesia Checklist: Patient identified, Emergency Drugs available, Suction available and Patient being monitored Patient Re-evaluated:Patient Re-evaluated prior to induction Oxygen Delivery Method: Nasal cannula

## 2018-03-09 NOTE — H&P (Signed)
The History and Physical notes are on paper, have been signed, and are to be scanned. The patient remains stable and unchanged from the H&P.   Previous H&P reviewed, patient examined, and there are no changes.  Jodi Kirby 03/09/2018 8:27 AM

## 2018-03-09 NOTE — Anesthesia Postprocedure Evaluation (Signed)
Anesthesia Post Note  Patient: Jodi Kirby  Procedure(s) Performed: CATARACT EXTRACTION PHACO AND INTRAOCULAR LENS PLACEMENT (IOC) (Left Eye)  Patient location during evaluation: PACU Anesthesia Type: MAC Level of consciousness: awake Pain management: pain level controlled Vital Signs Assessment: post-procedure vital signs reviewed and stable Respiratory status: spontaneous breathing Cardiovascular status: blood pressure returned to baseline Postop Assessment: no headache Anesthetic complications: no    Jaci Standard, III,  Veneta Sliter D

## 2018-03-09 NOTE — Op Note (Signed)
OPERATIVE NOTE  SHANDORA KOOGLER [id number] 03/09/2018   PREOPERATIVE DIAGNOSIS:  Nuclear sclerotic cataract left eye. H25.12   POSTOPERATIVE DIAGNOSIS:    Nuclear sclerotic cataract left eye.     PROCEDURE:  Phacoemusification with posterior chamber intraocular lens placement of the left eye   LENS:   Implant Name Type Inv. Item Serial No. Manufacturer Lot No. LRB No. Used  LENS IOL DIOP 21.0 - [id number] Intraocular Lens LENS IOL DIOP 21.0 [id number] AMO  Left 1        ULTRASOUND TIME: 11  % of 1 minutes 14 seconds, CDE 8.5  SURGEON:  Deirdre Evener, MD   ANESTHESIA:  Topical with tetracaine drops and 2% Xylocaine jelly, augmented with 1% preservative-free intracameral lidocaine.    COMPLICATIONS:  None.   DESCRIPTION OF PROCEDURE:  The patient was identified in the holding room and transported to the operating room and placed in the supine position under the operating microscope.  The left eye was identified as the operative eye and it was prepped and draped in the usual sterile ophthalmic fashion.   A 1 millimeter clear-corneal paracentesis was made at the 1:30 position.  0.5 ml of preservative-free 1% lidocaine was injected into the anterior chamber.  The anterior chamber was filled with Viscoat viscoelastic.  A 2.4 millimeter keratome was used to make a near-clear corneal incision at the 10:30 position.  .  A curvilinear capsulorrhexis was made with a cystotome and capsulorrhexis forceps.  Balanced salt solution was used to hydrodissect and hydrodelineate the nucleus.   Phacoemulsification was then used in stop and chop fashion to remove the lens nucleus and epinucleus.  The remaining cortex was then removed using the irrigation and aspiration handpiece. Provisc was then placed into the capsular bag to distend it for lens placement.  A lens was then injected into the capsular bag.  The remaining viscoelastic was aspirated.   Wounds were hydrated with balanced salt  solution.  The anterior chamber was inflated to a physiologic pressure with balanced salt solution.  No wound leaks were noted. Cefuroxime 0.1 ml of a /ml solution was injected into the anterior chamber for a dose of 1 mg of intracameral antibiotic at the completion of the case.   Timolol and Brimonidine drops were applied to the eye.  The patient was taken to the recovery room in stable condition without complications of anesthesia or surgery.  Rilya Longo 03/09/2018, 9:14 AM

## 2018-03-10 ENCOUNTER — Encounter: Payer: Self-pay | Admitting: Ophthalmology

## 2018-04-18 DIAGNOSIS — Z961 Presence of intraocular lens: Secondary | ICD-10-CM | POA: Diagnosis not present

## 2018-05-03 ENCOUNTER — Encounter: Payer: Self-pay | Admitting: Family Medicine

## 2018-05-03 ENCOUNTER — Ambulatory Visit (INDEPENDENT_AMBULATORY_CARE_PROVIDER_SITE_OTHER): Payer: Medicare PPO | Admitting: Family Medicine

## 2018-05-03 VITALS — BP 130/76 | HR 72 | Temp 98.5°F | Resp 16 | Ht 63.0 in | Wt 190.2 lb

## 2018-05-03 DIAGNOSIS — N3001 Acute cystitis with hematuria: Secondary | ICD-10-CM

## 2018-05-03 LAB — POCT URINALYSIS DIPSTICK
GLUCOSE UA: NEGATIVE
Ketones, UA: NEGATIVE
Nitrite, UA: NEGATIVE
Protein, UA: POSITIVE — AB
Urobilinogen, UA: 0.2 E.U./dL
pH, UA: 8.5 — AB (ref 5.0–8.0)

## 2018-05-03 MED ORDER — SULFAMETHOXAZOLE-TRIMETHOPRIM 800-160 MG PO TABS: 1.0000 | ORAL_TABLET | Freq: Two times a day (BID) | ORAL | 0 refills | Status: AC

## 2018-05-03 NOTE — Patient Instructions (Signed)

## 2018-05-03 NOTE — Progress Notes (Signed)
Name: Jodi Kirby   MRN: [id number]    DOB: 06-24-1946   Date:05/03/2018       Progress Note  Subjective  Chief Complaint  Chief Complaint  Patient presents with  . Urinary Tract Infection    burning, pressure for 3 days    HPI  Patient presents with concern for UTI - dysuria and RLQ pain on urination for about 3 days, worsening over the course of the 3 days.  She has history of UTI's in the past, no history of kidney stones.  She denies hematuria, or abnormal back pain (has chronic back pain), nausea, vomiting, diarrhea.  Patient Active Problem List   Diagnosis Date Noted  . Mild cognitive impairment 12/21/2017  . Aortic atherosclerosis (HCC) 05/15/2017  . Hyperglycemia 11/17/2016  . Chest pain 06/27/2016  . Osteopenia 05/15/2016  . Insomnia 11/08/2015  . Angina pectoris (HCC) 07/10/2015  . Seasonal allergic rhinitis 07/09/2015  . GERD (gastroesophageal reflux disease) 07/09/2015  . Bee sting allergy 07/09/2015  . History of knee replacement procedure of right knee 04/29/2015  . History of colonic polyps 09/24/2014  . Fibrocystic breast 09/24/2014  . Arthritis of knee, degenerative 06/12/2014  . Hyperlipidemia 02/09/2013  . Coronary artery disease 02/09/2013  . Obstructive sleep apnea on CPAP 02/09/2013  . Hypertension 02/09/2013    Social History   Tobacco Use  . Smoking status: Never Smoker  . Smokeless tobacco: Never Used  . Tobacco comment: smoking cessation materials not required  Substance Use Topics  . Alcohol use: No    Alcohol/week: 0.0 oz     Current Outpatient Medications:  .  acetaminophen (TYLENOL) 650 MG CR tablet, Take 1 tablet daily as needed by mouth., Disp: , Rfl:  .  alendronate (FOSAMAX) 70 MG tablet, TAKE 1 TABLET WEEKLY, Disp: 12 tablet, Rfl: 3 .  Amlodipine-Valsartan-HCTZ 5-160-25 MG TABS, Take 1 tablet daily by mouth., Disp: 90 tablet, Rfl: 1 .  aspirin EC 81 MG tablet, Take 81 mg by mouth every morning., Disp: , Rfl:  .   Cholecalciferol (VITAMIN D3) 1000 UNITS CAPS, Take 1,000 Units by mouth every morning. , Disp: , Rfl:  .  EPINEPHrine (EPIPEN 2-PAK) 0.3 mg/0.3 mL DEVI, Inject 0.3 mg into the muscle as needed. , Disp: , Rfl:  .  fluticasone (FLONASE) 50 MCG/ACT nasal spray, USE TWO SPRAY(S) IN EACH NOSTRIL AT BEDTIME, Disp: 48 g, Rfl: 1 .  loratadine (CLARITIN) 10 MG tablet, Take 1 tablet (10 mg total) by mouth daily. (Patient taking differently: Take 10 mg by mouth daily as needed for allergies. ), Disp: 30 tablet, Rfl: 5 .  meclizine (ANTIVERT) 25 MG tablet, Take 25 mg by mouth 3 (three) times daily as needed for dizziness., Disp: , Rfl:  .  Multiple Vitamins-Minerals (CENTRUM SILVER PO), Take 1 tablet by mouth every morning. , Disp: , Rfl:  .  nitroGLYCERIN (NITROSTAT) 0.4 MG SL tablet, Place 1 tablet (0.4 mg total) under the tongue every 5 (five) minutes as needed for chest pain., Disp: 25 tablet, Rfl: 0 .  omeprazole (PRILOSEC) 40 MG capsule, Take 1 capsule (40 mg total) by mouth daily., Disp: 90 capsule, Rfl: 3 .  traZODone (DESYREL) 50 MG tablet, Take 1 tablet (50 mg total) by mouth at bedtime as needed for sleep., Disp: 90 tablet, Rfl: 0 .  ezetimibe (ZETIA) 10 MG tablet, Take 1 tablet (10 mg total) by mouth daily. (Patient not taking: Reported on 02/02/2018), Disp: 90 tablet, Rfl: 1  Allergies  Allergen Reactions  .  Niaspan [Niacin Er] Other (See Comments)    Patient states she can't remember the reaction because it so long ago.  . Oxycodone-Acetaminophen Nausea Only  . Statins Other (See Comments)    Muscle and joint pain.    ROS  Ten systems reviewed and is negative except as mentioned in HPI  Objective  Vitals:   05/03/18 0936  BP: 130/76  Pulse: 72  Resp: 16  Temp: 98.5 F (36.9 C)  TempSrc: Oral  SpO2: 99%  Weight: 190 lb 3.2 oz (86.3 kg)  Height: 5\' 3"  (1.6 m)   Body mass index is 33.69 kg/m.  Nursing Note and Vital Signs reviewed.  Physical Exam  Constitutional: Patient  appears well-developed and well-nourished. Obese No distress.  HEENT: head atraumatic, normocephalic Cardiovascular: Normal rate, regular rhythm, S1/S2 present.  No murmur or rub heard. No BLE edema. Pulmonary/Chest: Effort normal and breath sounds clear. No respiratory distress or retractions. Abdominal: Soft and RLQ tenderness present on palpation, bowel sounds present x4 quadrants.  No CVA Tenderness Psychiatric: Patient has a normal mood and affect. behavior is normal. Judgment and thought content normal.  Results for orders placed or performed in visit on 05/03/18 (from the past 72 hour(s))  POCT urinalysis dipstick     Status: Abnormal   Collection Time: 05/03/18  9:51 AM  Result Value Ref Range   Color, UA yellow    Clarity, UA clear    Glucose, UA Negative Negative   Bilirubin, UA small    Ketones, UA negative    Spec Grav, UA <=1.005 (A) 1.010 - 1.025   Blood, UA trace    pH, UA 8.5 (A) 5.0 - 8.0   Protein, UA Positive (A) Negative   Urobilinogen, UA 0.2 0.2 or 1.0 E.U./dL   Nitrite, UA negative    Leukocytes, UA Moderate (2+) (A) Negative   Appearance clear    Odor none     Assessment & Plan  1. Acute cystitis with hematuria - POCT urinalysis dipstick - Urine Culture - sulfamethoxazole-trimethoprim (BACTRIM DS,SEPTRA DS) 800-160 MG tablet; Take 1 tablet by mouth 2 (two) times daily for 3 days.  Dispense: 6 tablet; Refill: 0 - Plan to repeat urine sample at follow up with Dr. Carlynn PurlSowles on 05/20/2018 to ensure hematuria resolution.  -Red flags and when to present for emergency care or RTC including fever >101.77F, chest pain, shortness of breath, new/worsening/un-resolving symptoms, nausea/vomiting, hematuria, low back pain, severe abdominal pain reviewed with patient at time of visit. Follow up and care instructions discussed and provided in AVS.

## 2018-05-04 LAB — URINE CULTURE
MICRO NUMBER: 90788924
SPECIMEN QUALITY: ADEQUATE

## 2018-05-20 ENCOUNTER — Ambulatory Visit (INDEPENDENT_AMBULATORY_CARE_PROVIDER_SITE_OTHER): Payer: Medicare PPO | Admitting: Family Medicine

## 2018-05-20 ENCOUNTER — Encounter: Payer: Self-pay | Admitting: Family Medicine

## 2018-05-20 VITALS — BP 130/78 | HR 57 | Temp 98.8°F | Resp 14 | Ht 63.0 in | Wt 190.6 lb

## 2018-05-20 DIAGNOSIS — D638 Anemia in other chronic diseases classified elsewhere: Secondary | ICD-10-CM | POA: Diagnosis not present

## 2018-05-20 DIAGNOSIS — N183 Chronic kidney disease, stage 3 unspecified: Secondary | ICD-10-CM

## 2018-05-20 DIAGNOSIS — E559 Vitamin D deficiency, unspecified: Secondary | ICD-10-CM

## 2018-05-20 DIAGNOSIS — R739 Hyperglycemia, unspecified: Secondary | ICD-10-CM | POA: Diagnosis not present

## 2018-05-20 DIAGNOSIS — I10 Essential (primary) hypertension: Secondary | ICD-10-CM

## 2018-05-20 DIAGNOSIS — Z9989 Dependence on other enabling machines and devices: Secondary | ICD-10-CM

## 2018-05-20 DIAGNOSIS — E782 Mixed hyperlipidemia: Secondary | ICD-10-CM

## 2018-05-20 DIAGNOSIS — G4700 Insomnia, unspecified: Secondary | ICD-10-CM | POA: Diagnosis not present

## 2018-05-20 DIAGNOSIS — G4733 Obstructive sleep apnea (adult) (pediatric): Secondary | ICD-10-CM | POA: Diagnosis not present

## 2018-05-20 DIAGNOSIS — K219 Gastro-esophageal reflux disease without esophagitis: Secondary | ICD-10-CM

## 2018-05-20 DIAGNOSIS — I2510 Atherosclerotic heart disease of native coronary artery without angina pectoris: Secondary | ICD-10-CM

## 2018-05-20 DIAGNOSIS — I209 Angina pectoris, unspecified: Secondary | ICD-10-CM | POA: Diagnosis not present

## 2018-05-20 DIAGNOSIS — I2583 Coronary atherosclerosis due to lipid rich plaque: Secondary | ICD-10-CM

## 2018-05-20 DIAGNOSIS — I700 Atherosclerosis of aorta: Secondary | ICD-10-CM

## 2018-05-20 DIAGNOSIS — D649 Anemia, unspecified: Secondary | ICD-10-CM | POA: Diagnosis not present

## 2018-05-20 MED ORDER — TRAZODONE HCL 50 MG PO TABS: 50.0000 mg | ORAL_TABLET | Freq: Every evening | ORAL | 0 refills | Status: DC | PRN

## 2018-05-20 MED ORDER — AMLODIPINE-VALSARTAN-HCTZ 5-160-25 MG PO TABS: 1.0000 | ORAL_TABLET | Freq: Every day | ORAL | 1 refills | Status: DC

## 2018-05-20 NOTE — Progress Notes (Signed)
Name: RICKA WESTRA   MRN: [id number]    DOB: [birth year]   Date:05/20/2018       Progress Note  Subjective  Chief Complaint  Chief Complaint  Patient presents with  . Follow-up    patient is here for a 4 month f/u  . Hypertension  . Hyperlipidemia  . Hyperglycemia  . Sleep Apnea    HPI   CKI: on two occasions GFR below 60, we will recheck labs today, avoid nsaid's. She denies pruritis and has normal urine output.   OSA: using CPAP every night, she keeps it on for about 7 hours per night.She states some nights sheis taking Trazodone half to one prn and tolerating medication well without side effects.Unchanged  Hemorrhoid: she states she had one episode of constipation and straining and felt a lump on anal area afterwards, not painful and no bleeding but would like to have it checked today   Knee replacement surgery right: surgery was June 27 th, 2016, she has completed her PT and flexion up o 128 extension 180 degrees ( she states it is even better now - flexing more ), she is still doing her exercises at home and is back at the gym three times weekly. She is off Meloxicam , she states occasionally right is knee is locking up, she did not go back to Dr. Gavin Potters as recommend. She states it seems to be locking less often now. Symptoms intermittent, explained importance of not taking nsaid's because of CKI   HTN:she is back on one pill of Exforge HCTZ  and bp is at goal, no chest pain or palpitation, denies side effects of medication. BP is at goal today, bp at home has been also been controlled.   Hyperlipidemia/aorta atherosclerosis : unable to tolerate statins, even in a low dose of 2.5 mg of Crestor, muscles aches were severe and she stopped medication. Dr. Mariah Milling advised Zetia and rx was written but unable to afford medication  GERD: under control with Omeprazole in am'sand symptoms have been well controlled.No heartburn, no regurgitation. Unchanged  Angina  Pectoris: she has CAD, mild disease, seeing Dr. Mariah Milling, went to [facility] 06/2016 - last episode of chest pain, doing well now. Taking aspirin and ARB, not on beta-blocker because of bradycardia and not on statin because of intolerance, she could not afford Zetia - $140 and had to ship it back to pharmacy. Never took medication   Osteopenia: diagnosed in 2016 - and was started on Fosamax,she is doing well, last bone density in 2018, no side effects of medication, she needs refills.   Vertigo: she has a history of BPPV, had Epley maneuver donein 2017and symptoms resolved, she had another flare 07/2017 withspinning sensation and lack of balance, sometimes associated with nausea, but no vomiting. She denies tinnitus or hearing loss. She was seen by ENT they performed another Epley maneuver and symptoms are under control again.No problems recently   Hyperglycemia: she denies polyphagia, polydipsia but has nocturia which is stable, at most twice per night. Last hgbA1C was normal   Anemia chronic disease: going on for a long time, recheck levels today  Mild cognitive dysfunction: found on medicare wellness, discussed referral neurologist or consider medication but she wants to hold off. She refuses to have MMS   Patient Active Problem List   Diagnosis Date Noted  . Mild cognitive impairment 12/21/2017  . Aortic atherosclerosis (HCC) 05/15/2017  . Hyperglycemia 11/17/2016  . Chest pain 06/27/2016  . Osteopenia 05/15/2016  . Insomnia  11/08/2015  . Angina pectoris (HCC) 07/10/2015  . Seasonal allergic rhinitis 07/09/2015  . GERD (gastroesophageal reflux disease) 07/09/2015  . Bee sting allergy 07/09/2015  . History of knee replacement procedure of right knee 04/29/2015  . History of colonic polyps 09/24/2014  . Fibrocystic breast 09/24/2014  . Arthritis of knee, degenerative 06/12/2014  . Hyperlipidemia 02/09/2013  . Coronary artery disease 02/09/2013  . Obstructive sleep apnea on CPAP  02/09/2013  . Hypertension 02/09/2013    Past Surgical History:  Procedure Laterality Date  . ABDOMINAL HYSTERECTOMY  1970  . ANKLE SURGERY Right 1980  . BLADDER SURGERY  2012  . CARDIAC CATHETERIZATION  2003   [place]  . CARDIAC CATHETERIZATION     [place]  . CATARACT EXTRACTION W/PHACO Right 02/09/2018   Procedure: CATARACT EXTRACTION PHACO AND INTRAOCULAR LENS PLACEMENT (IOC) RIGHT;  Surgeon: Lockie MolaBrasington, Chadwick, MD;  [facility];  Service: Ophthalmology;  Laterality: Right;  sleep apnea  . CATARACT EXTRACTION W/PHACO Left 03/09/2018   Procedure: CATARACT EXTRACTION PHACO AND INTRAOCULAR LENS PLACEMENT (IOC);  Surgeon: Lockie MolaBrasington, Chadwick, MD;  [facility];  Service: Ophthalmology;  Laterality: Left;  IVA TOPICAL LEFT  . COLONOSCOPY  2008, 2015   Dr. Servando SnareWohl  . CYSTOSTOMY    . KNEE SURGERY  08/18/2011   arthroscopic right  . LUMBAR FUSION     L4-5, S-1  . NECK SURGERY  2003  . TOTAL KNEE ARTHROPLASTY Right 04/29/2015   Procedure: TOTAL KNEE ARTHROPLASTY;  Surgeon: Erin SonsHarold Kernodle, MD;  Location: [facility];  Service: Orthopedics;  Laterality: Right;  . TUBAL LIGATION      Family History  Problem Relation Age of Onset  . Kidney disease Mother   . Hypertension Mother   . CAD Father   . Healthy Sister   . Kidney cancer Neg Hx   . Bladder Cancer Neg Hx     Social History   Socioeconomic History  . Marital status: Married    Spouse name: Morris  . Number of children: 4  . Years of education: Not on file  . Highest education level: 11th grade  Occupational History  . Occupation: Retired  Engineer, productionocial Needs  . Financial resource strain: Not hard at all  . Food insecurity:    Worry: Never true    Inability: Never true  . Transportation needs:    Medical: No    Non-medical: No  Tobacco Use  . Smoking status: Never Smoker  . Smokeless tobacco: Never Used  . Tobacco comment: smoking cessation materials not required  Substance and Sexual  Activity  . Alcohol use: No    Alcohol/week: 0.0 oz  . Drug use: No  . Sexual activity: Not Currently    Partners: Male    Birth control/protection: None  Lifestyle  . Physical activity:    Days per week: 3 days    Minutes per session: 60 min  . Stress: Not at all  Relationships  . Social connections:    Talks on phone: More than three times a week    Gets together: More than three times a week    Attends religious service: More than 4 times per year    Active member of club or organization: Yes    Attends meetings of clubs or organizations: More than 4 times per year    Relationship status: Married  . Intimate partner violence:    Fear of current or ex partner: No    Emotionally abused: No    Physically  abused: No    Forced sexual activity: No  Other Topics Concern  . Not on file  Social History Narrative  . Not on file     Current Outpatient Medications:  .  acetaminophen (TYLENOL) 650 MG CR tablet, Take 1 tablet daily as needed by mouth., Disp: , Rfl:  .  alendronate (FOSAMAX) 70 MG tablet, TAKE 1 TABLET WEEKLY, Disp: 12 tablet, Rfl: 3 .  aspirin EC 81 MG tablet, Take 81 mg by mouth every morning., Disp: , Rfl:  .  Cholecalciferol (VITAMIN D3) 1000 UNITS CAPS, Take 1,000 Units by mouth every morning. , Disp: , Rfl:  .  EPINEPHrine (EPIPEN 2-PAK) 0.3 mg/0.3 mL DEVI, Inject 0.3 mg into the muscle as needed. , Disp: , Rfl:  .  fluticasone (FLONASE) 50 MCG/ACT nasal spray, USE TWO SPRAY(S) IN EACH NOSTRIL AT BEDTIME, Disp: 48 g, Rfl: 1 .  loratadine (CLARITIN) 10 MG tablet, Take 1 tablet (10 mg total) by mouth daily. (Patient taking differently: Take 10 mg by mouth daily as needed for allergies. ), Disp: 30 tablet, Rfl: 5 .  meclizine (ANTIVERT) 25 MG tablet, Take 25 mg by mouth 3 (three) times daily as needed for dizziness., Disp: , Rfl:  .  Multiple Vitamins-Minerals (CENTRUM SILVER PO), Take 1 tablet by mouth every morning. , Disp: , Rfl:  .  nitroGLYCERIN (NITROSTAT) 0.4  MG SL tablet, Place 1 tablet (0.4 mg total) under the tongue every 5 (five) minutes as needed for chest pain., Disp: 25 tablet, Rfl: 0 .  omeprazole (PRILOSEC) 40 MG capsule, Take 1 capsule (40 mg total) by mouth daily., Disp: 90 capsule, Rfl: 3 .  amLODIPine-Valsartan-HCTZ 5-160-25 MG TABS, Take 1 tablet by mouth daily., Disp: 90 tablet, Rfl: 1 .  traZODone (DESYREL) 50 MG tablet, Take 1 tablet (50 mg total) by mouth at bedtime as needed for sleep., Disp: 90 tablet, Rfl: 0  Allergies  Allergen Reactions  . Niaspan [Niacin Er] Other (See Comments)    Patient states she can't remember the reaction because it so long ago.  . Oxycodone-Acetaminophen Nausea Only  . Statins Other (See Comments)    Muscle and joint pain.     ROS  Constitutional: Negative for fever or weight change.  Respiratory: Negative for cough and shortness of breath.   Cardiovascular: Negative for chest pain or palpitations.  Gastrointestinal: Negative for abdominal pain, no bowel changes.  Musculoskeletal: Negative for gait problem or joint swelling.  Skin: Negative for rash.  Neurological: Negative for dizziness or headache.  No other specific complaints in a complete review of systems (except as listed in HPI above).  Objective  Vitals:   05/20/18 0803  BP: 130/78  Pulse: (!) 57  Resp: 14  Temp: 98.8 F (37.1 C)  TempSrc: Oral  SpO2: 96%  Weight: 190 lb 9.6 oz (86.5 kg)  Height: 5\' 3"  (1.6 m)    Body mass index is 33.76 kg/m.  Physical Exam  Constitutional: Patient appears well-developed and well-nourished. ObeseNo distress.  HEENT: head atraumatic, normocephalic, pupils equal and reactive to light, neck supple, throat within normal limits Cardiovascular: Normal rate, regular rhythm and normal heart sounds.  No murmur heard. No BLE edema. Pulmonary/Chest: Effort normal and breath sounds normal. No respiratory distress. Abdominal: Soft.  There is no tenderness, external anal exam showed external  hemorrhoid, no blood  Psychiatric: Patient has a normal mood and affect. behavior is normal. Judgment and thought content normal.  Recent Results (from the past 2160 hour(s))  POCT urinalysis dipstick     Status: Abnormal   Collection Time: 05/03/18  9:51 AM  Result Value Ref Range   Color, UA yellow    Clarity, UA clear    Glucose, UA Negative Negative   Bilirubin, UA small    Ketones, UA negative    Spec Grav, UA <=1.005 (A) 1.010 - 1.025   Blood, UA trace    pH, UA 8.5 (A) 5.0 - 8.0   Protein, UA Positive (A) Negative   Urobilinogen, UA 0.2 0.2 or 1.0 E.U./dL   Nitrite, UA negative    Leukocytes, UA Moderate (2+) (A) Negative   Appearance clear    Odor none   Urine Culture     Status: None   Collection Time: 05/03/18 10:15 AM  Result Value Ref Range   MICRO NUMBER: [id number]    SPECIMEN QUALITY: ADEQUATE    Sample Source URINE    STATUS: FINAL    ISOLATE 1:      Single organism less than 10,000 CFU/mL isolated. These organisms, commonly found on external and internal genitalia, are considered colonizers. No further testing performed.      PHQ2/9: Depression screen [facility] 2/9 05/20/2018 12/21/2017 05/17/2017 01/25/2017 05/15/2016  Decreased Interest 0 0 0 0 0  Down, Depressed, Hopeless 0 0 0 0 0  PHQ - 2 Score 0 0 0 0 0  Altered sleeping 0 - - - -  Tired, decreased energy 0 - - - -  Change in appetite 0 - - - -  Feeling bad or failure about yourself  0 - - - -  Trouble concentrating 0 - - - -  Moving slowly or fidgety/restless 0 - - - -  Suicidal thoughts 0 - - - -  PHQ-9 Score 0 - - - -     Fall Risk: Fall Risk  05/20/2018 01/18/2018 12/21/2017 09/20/2017 05/17/2017  Falls in the past year? No No No No No    Functional Status Survey: Is the patient deaf or have difficulty hearing?: No Does the patient have difficulty seeing, even when wearing glasses/contacts?: No Does the patient have difficulty concentrating, remembering, or making decisions?: No Does the patient  have difficulty walking or climbing stairs?: No Does the patient have difficulty dressing or bathing?: No Does the patient have difficulty doing errands alone such as visiting a doctor's office or shopping?: No  I personally reviewed Family, Social and Surgical history with the patient/caregiver today.   Assessment & Plan  1. Essential hypertension  - amLODIPine-Valsartan-HCTZ 5-160-25 MG TABS; Take 1 tablet by mouth daily.  Dispense: 90 tablet; Refill: 1  2. Stage 3 chronic kidney disease (HCC)  Advised to avoid nsaid's  - COMPLETE METABOLIC PANEL WITH GFR  3. Insomnia, unspecified type  - traZODone (DESYREL) 50 MG tablet; Take 1 tablet (50 mg total) by mouth at bedtime as needed for sleep.  Dispense: 90 tablet; Refill: 0  4. Angina pectoris (HCC)  No recent chest pain, on medical management at this time  5. Aortic atherosclerosis (HCC)  She is on aspirin 81 mg , refuses statin therapy   6. Gastroesophageal reflux disease without esophagitis  Under control at this time  7. Anemia of chronic disease  - Hemoglobin and hematocrit, blood  8. Coronary artery disease due to lipid rich plaque  Cannot take statin and Zetia is too expensive  9. Obstructive sleep apnea on CPAP   10. Vitamin D deficiency  Continue otc supplementation   11. Hyperglycemia   12.  Mixed hyperlipidemia

## 2018-05-23 LAB — HEMOGLOBIN AND HEMATOCRIT, BLOOD
HEMATOCRIT: 32 % — AB (ref 35.0–45.0)
Hemoglobin: 10.6 g/dL — ABNORMAL LOW (ref 11.7–15.5)

## 2018-05-23 LAB — COMPLETE METABOLIC PANEL WITH GFR
AG Ratio: 2 (calc) (ref 1.0–2.5)
ALKALINE PHOSPHATASE (APISO): 84 U/L (ref 33–130)
ALT: 17 U/L (ref 6–29)
AST: 15 U/L (ref 10–35)
Albumin: 4.3 g/dL (ref 3.6–5.1)
BILIRUBIN TOTAL: 0.3 mg/dL (ref 0.2–1.2)
BUN/Creatinine Ratio: 15 (calc) (ref 6–22)
BUN: 15 mg/dL (ref 7–25)
CO2: 31 mmol/L (ref 20–32)
Calcium: 9.6 mg/dL (ref 8.6–10.4)
Chloride: 105 mmol/L (ref 98–110)
Creat: 1 mg/dL — ABNORMAL HIGH (ref 0.60–0.93)
GFR, Est African American: 65 mL/min/{1.73_m2} (ref >=60)
GFR, Est Non African American: 56 mL/min/{1.73_m2} — ABNORMAL LOW (ref >=60)
Globulin: 2.1 g/dL (calc) (ref 1.9–3.7)
Glucose, Bld: 98 mg/dL (ref 65–99)
POTASSIUM: 4.1 mmol/L (ref 3.5–5.3)
SODIUM: 143 mmol/L (ref 135–146)
Total Protein: 6.4 g/dL (ref 6.1–8.1)

## 2018-05-23 LAB — TEST AUTHORIZATION

## 2018-05-23 LAB — IRON, TOTAL/TOTAL IRON BINDING CAP
%SAT: 30 % (ref 16–45)
IRON: 84 ug/dL (ref 45–160)
TIBC: 283 ug/dL (ref 250–450)

## 2018-07-10 NOTE — Progress Notes (Signed)
Cardiology Office Note  Date:  07/11/2018   ID:  Jodi Kirby, DOB 1946/03/19, MRN [id number]  PCP:  Alba Cory, MD   Chief Complaint  Patient presents with  . other    12 mo f/u. Medications verbally reviewed.     HPI:  [name] is a very pleasant 72 year old woman with a history of  coronary  arterial disease, catheterization in 2003 with 20% disease in her RCA and LAD,  hyperlipidemia, back surgery,  obstructive sleep apnea on CPAP,  Total knee replacement patient of Dr. Carlynn Purl who previously presented for symptoms of chest pressure.  She presents for routine followup of her CAD, hyperlipidemia  In follow-up today she reports that she feels well Denies any significant chest pain or shortness of breath on exertion She is retired Does exercise 2 days a week  BP 140 at home Takes 1/2 pill blood pressure daily She feels the whole blood pressure pill was causing some leg cramping, symptoms improved on 1/2 pill Whole pill is amlodipine valsartan HCTZ 5/160/25  Again reports she is intolerant of statins even low-dose Crestor Was prescribed Zetia but reports it was too expensive  tried several other statins. Each of these caused cramping  EKG personally reviewed by myself on todays visit Shows normal sinus rhythm with rate 53 bpm no significant ST or T wave changes  Other past medical history reviewed admitted to the hospital August 2017 for chest pain Cardiac enzymes negative,  She had outpatient Exercise stress Myoview test 07/29/2016 showing no ischemia, Ejection fraction 62%  Stress test in 2014 And August 2017 showing no ischemia   PMH:   has a past medical history of Allergic rhinitis, cause unspecified, Coronary artery disease, Dysmetabolic syndrome X, Dysuria, Esophageal reflux, Essential hypertension, benign, Hyperlipidemia, Lumbago, Lump or mass in breast, Myalgia and myositis, unspecified, Obstructive sleep apnea, Osteoarthrosis, unspecified  whether generalized or localized, lower leg, Other abnormal glucose, Other and unspecified angina pectoris, 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  PSH:    Past Surgical History:  Procedure Laterality Date  . ABDOMINAL HYSTERECTOMY  1970  . ANKLE SURGERY Right 1980  . BLADDER SURGERY  2012  . CARDIAC CATHETERIZATION  2003   [place]  . CARDIAC CATHETERIZATION     [place]  . CATARACT EXTRACTION W/PHACO Right 02/09/2018   Procedure: CATARACT EXTRACTION PHACO AND INTRAOCULAR LENS PLACEMENT (IOC) RIGHT;  Surgeon: Lockie Mola, MD;  Location[facility];  Service: Ophthalmology;  Laterality: Right;  sleep apnea  . CATARACT EXTRACTION W/PHACO Left 03/09/2018   Procedure: CATARACT EXTRACTION PHACO AND INTRAOCULAR LENS PLACEMENT (IOC);  Surgeon: Lockie Mola, MD;  Location: [facility];  Service: Ophthalmology;  Laterality: Left;  IVA TOPICAL LEFT  . COLONOSCOPY  2008, 2015   Dr. Servando Snare  . CYSTOSTOMY    . KNEE SURGERY  08/18/2011   arthroscopic right  . LUMBAR FUSION     L4-5, S-1  . NECK SURGERY  2003  . TOTAL KNEE ARTHROPLASTY Right 04/29/2015   Procedure: TOTAL KNEE ARTHROPLASTY;  Surgeon: Erin Sons, MD;  Location: [facility];  Service: Orthopedics;  Laterality: Right;  . TUBAL LIGATION      Current Outpatient Medications  Medication Sig Dispense Refill  . acetaminophen (TYLENOL) 650 MG CR tablet Take 1 tablet daily as needed by mouth.    Marland Kitchen alendronate (FOSAMAX) 70 MG tablet TAKE 1 TABLET WEEKLY 12  tablet 3  . amLODIPine-Valsartan-HCTZ 5-160-25 MG TABS Take 1 tablet by mouth daily. 90 tablet 1  . aspirin EC 81 MG tablet Take 81 mg by mouth every morning.    . Cholecalciferol (VITAMIN D3) 1000 UNITS CAPS Take 1,000 Units by mouth every morning.     Marland Kitchen EPINEPHrine (EPIPEN 2-PAK) 0.3 mg/0.3 mL DEVI  Inject 0.3 mg into the muscle as needed.     . fluticasone (FLONASE) 50 MCG/ACT nasal spray USE TWO SPRAY(S) IN EACH NOSTRIL AT BEDTIME 48 g 1  . loratadine (CLARITIN) 10 MG tablet Take 1 tablet (10 mg total) by mouth daily. (Patient taking differently: Take 10 mg by mouth daily as needed for allergies. ) 30 tablet 5  . meclizine (ANTIVERT) 25 MG tablet Take 25 mg by mouth 3 (three) times daily as needed for dizziness.    . Multiple Vitamins-Minerals (CENTRUM SILVER PO) Take 1 tablet by mouth every morning.     . nitroGLYCERIN (NITROSTAT) 0.4 MG SL tablet Place 1 tablet (0.4 mg total) under the tongue every 5 (five) minutes as needed for chest pain. 25 tablet 0  . omeprazole (PRILOSEC) 40 MG capsule Take 1 capsule (40 mg total) by mouth daily. 90 capsule 3  . traZODone (DESYREL) 50 MG tablet Take 1 tablet (50 mg total) by mouth at bedtime as needed for sleep. 90 tablet 0   No current facility-administered medications for this visit.      Allergies:   Niaspan [niacin er]; Oxycodone-acetaminophen; and Statins   Social History:  The patient  reports that she has never smoked. She has never used smokeless tobacco. She reports that she does not drink alcohol or use drugs.   Family History:   family history includes CAD in her father; Healthy in her sister; Hypertension in her mother; Kidney disease in her mother.    Review of Systems: Review of Systems  Constitutional: Negative.   Respiratory: Negative.   Cardiovascular: Negative.   Gastrointestinal: Negative.   Musculoskeletal: Negative.   Neurological: Negative.   Psychiatric/Behavioral: Negative.   All other systems reviewed and are negative.    PHYSICAL EXAM: VS:  BP (!) 148/70 (BP Location: Left Arm, Patient Position: Sitting, Cuff Size: Normal)   Pulse (!) 53   Ht 5\' 3"  (1.6 m)   Wt 186 lb (84.4 kg)   BMI 32.95 kg/m  , BMI Body mass index is 32.95 kg/m. Constitutional:  oriented to person, place, and time. No distress.   HENT:  Head: Normocephalic and atraumatic.  Eyes:  no discharge. No scleral icterus.  Neck: Normal range of motion. Neck supple. No JVD present.  Cardiovascular: Normal rate, regular rhythm, normal heart sounds and intact distal pulses. Exam reveals no gallop and no friction rub. No edema No murmur heard. Pulmonary/Chest: Effort normal and breath sounds normal. No stridor. No respiratory distress.  no wheezes.  no rales.  no tenderness.  Abdominal: Soft.  no distension.  no tenderness.  Musculoskeletal: Normal range of motion.  no  tenderness or deformity.  Neurological:  normal muscle tone. Coordination normal. No atrophy Skin: Skin is warm and dry. No rash noted. not diaphoretic.  Psychiatric:  normal mood and affect. behavior is normal. Thought content normal.     Recent Labs: 01/18/2018: Platelets 269 05/20/2018: ALT 17; BUN 15; Creat 1.00; Hemoglobin 10.6; Potassium 4.1; Sodium 143    Lipid Panel Lab Results  Component Value Date   CHOL 233 (H) 01/18/2018   HDL 45 (L) 01/18/2018   LDLCALC  145 (H) 01/18/2018   TRIG 282 (H) 01/18/2018      Wt Readings from Last 3 Encounters:  07/11/18 186 lb (84.4 kg)  05/20/18 190 lb 9.6 oz (86.5 kg)  05/03/18 190 lb 3.2 oz (86.3 kg)       ASSESSMENT AND PLAN:  Mixed hyperlipidemia - Plan: EKG 12-Lead She has declined statin Long discussion with her, she is willing to try Zetia She will obtain this through good [email or web address] with coupon $26 for 3 months  Coronary artery disease due to lipid rich plaque - Plan: EKG 12-Lead Cardiac catheterization many years ago with minimal nonobstructive disease Stressed the importance of aggressive cholesterol control We will start Zetia as above  Essential hypertension - Plan: EKG 12-Lead Blood pressure is well controlled on today's visit. No changes made to the medications. Stable  Obstructive sleep apnea on CPAP - Plan: EKG 12-Lead We have encouraged continued exercise, careful diet management  in an effort to lose weight. She is exercising 2 days a week  Chest pain, unspecified type Periodic episodes of chest pain, previous hospitalization August 2017 Denies any significant chest pain on today's visit  Disposition:   F/U  12 months   Total encounter time more than 25 minutes  Greater than 50% was spent in counseling and coordination of care with the patient    Orders Placed This Encounter  Procedures  . EKG 12-Lead     Signed, Dossie Arbour, M.D., Ph.D. 07/11/2018  [facility] [place], [place] [phone]

## 2018-07-11 ENCOUNTER — Ambulatory Visit (INDEPENDENT_AMBULATORY_CARE_PROVIDER_SITE_OTHER): Payer: Medicare PPO | Admitting: Cardiovascular Disease

## 2018-07-11 ENCOUNTER — Encounter: Payer: Self-pay | Admitting: Cardiovascular Disease

## 2018-07-11 VITALS — BP 148/70 | HR 53 | Ht 63.0 in | Wt 186.0 lb

## 2018-07-11 DIAGNOSIS — E782 Mixed hyperlipidemia: Secondary | ICD-10-CM

## 2018-07-11 DIAGNOSIS — I700 Atherosclerosis of aorta: Secondary | ICD-10-CM | POA: Diagnosis not present

## 2018-07-11 DIAGNOSIS — I25118 Atherosclerotic heart disease of native coronary artery with other forms of angina pectoris: Secondary | ICD-10-CM | POA: Diagnosis not present

## 2018-07-11 DIAGNOSIS — R0609 Other forms of dyspnea: Secondary | ICD-10-CM | POA: Insufficient documentation

## 2018-07-11 DIAGNOSIS — I10 Essential (primary) hypertension: Secondary | ICD-10-CM

## 2018-07-11 DIAGNOSIS — I209 Angina pectoris, unspecified: Secondary | ICD-10-CM

## 2018-07-11 MED ORDER — EZETIMIBE 10 MG PO TABS: 10.0000 mg | ORAL_TABLET | Freq: Every day | ORAL | 3 refills | Status: DC

## 2018-07-11 NOTE — Patient Instructions (Addendum)
Medication Instructions:   Please start zetia one a day  When you run ouf of the current medication Consider changing to the amlodipine-valsartan-HCTZ 10/160/12.5  Labwork:  No new labs needed  Testing/Procedures:  No further testing at this time   Follow-Up: It was a pleasure seeing you in the office today. Please call us if you have new issues that need to be addressed before your next appt.  [phone]  Your physician wants you to follow-up in: 12 months.  You will receive a reminder letter in the mail two months in advance. If you don't receive a letter, please call our office to schedule the follow-up appointment.  If you need a refill on your cardiac medications before your next appointment, please call your pharmacy.  For educational health videos Log in to : [email or web address] Or : [email or web address], password : [email or web address]

## 2018-07-16 DIAGNOSIS — J019 Acute sinusitis, unspecified: Secondary | ICD-10-CM | POA: Diagnosis not present

## 2018-07-16 DIAGNOSIS — B9689 Other specified bacterial agents as the cause of diseases classified elsewhere: Secondary | ICD-10-CM | POA: Diagnosis not present

## 2018-07-19 DIAGNOSIS — Z981 Arthrodesis status: Secondary | ICD-10-CM | POA: Insufficient documentation

## 2018-07-19 DIAGNOSIS — M5137 Other intervertebral disc degeneration, lumbosacral region: Secondary | ICD-10-CM | POA: Insufficient documentation

## 2018-07-19 DIAGNOSIS — M545 Low back pain: Secondary | ICD-10-CM | POA: Diagnosis not present

## 2018-07-19 DIAGNOSIS — M51379 Other intervertebral disc degeneration, lumbosacral region without mention of lumbar back pain or lower extremity pain: Secondary | ICD-10-CM | POA: Insufficient documentation

## 2018-07-19 DIAGNOSIS — G8929 Other chronic pain: Secondary | ICD-10-CM | POA: Diagnosis not present

## 2018-07-19 DIAGNOSIS — E669 Obesity, unspecified: Secondary | ICD-10-CM | POA: Insufficient documentation

## 2018-08-02 DIAGNOSIS — M5441 Lumbago with sciatica, right side: Secondary | ICD-10-CM | POA: Diagnosis not present

## 2018-08-02 DIAGNOSIS — M5137 Other intervertebral disc degeneration, lumbosacral region: Secondary | ICD-10-CM | POA: Diagnosis not present

## 2018-08-02 DIAGNOSIS — Z981 Arthrodesis status: Secondary | ICD-10-CM | POA: Diagnosis not present

## 2018-08-02 DIAGNOSIS — G8929 Other chronic pain: Secondary | ICD-10-CM | POA: Diagnosis not present

## 2018-08-02 DIAGNOSIS — M545 Low back pain: Secondary | ICD-10-CM | POA: Diagnosis not present

## 2018-08-02 DIAGNOSIS — E669 Obesity, unspecified: Secondary | ICD-10-CM | POA: Diagnosis not present

## 2018-08-03 ENCOUNTER — Other Ambulatory Visit: Payer: Self-pay | Admitting: Orthopedic Surgery

## 2018-08-03 DIAGNOSIS — M5441 Lumbago with sciatica, right side: Principal | ICD-10-CM

## 2018-08-03 DIAGNOSIS — G8929 Other chronic pain: Secondary | ICD-10-CM

## 2018-08-03 DIAGNOSIS — Z981 Arthrodesis status: Secondary | ICD-10-CM

## 2018-08-17 ENCOUNTER — Ambulatory Visit
Admission: RE | Admit: 2018-08-17 | Discharge: 2018-08-17 | Disposition: A | Discharge status: Home / Self Care | Payer: Medicare PPO | Source: Ambulatory Visit | Attending: Orthopedic Surgery | Admitting: Orthopedic Surgery

## 2018-08-17 DIAGNOSIS — M545 Low back pain: Secondary | ICD-10-CM | POA: Diagnosis not present

## 2018-08-17 DIAGNOSIS — M48061 Spinal stenosis, lumbar region without neurogenic claudication: Secondary | ICD-10-CM | POA: Insufficient documentation

## 2018-08-17 DIAGNOSIS — M5441 Lumbago with sciatica, right side: Secondary | ICD-10-CM | POA: Insufficient documentation

## 2018-08-17 DIAGNOSIS — M5126 Other intervertebral disc displacement, lumbar region: Secondary | ICD-10-CM | POA: Diagnosis not present

## 2018-08-17 DIAGNOSIS — G8929 Other chronic pain: Secondary | ICD-10-CM | POA: Diagnosis not present

## 2018-08-17 DIAGNOSIS — Z981 Arthrodesis status: Secondary | ICD-10-CM | POA: Diagnosis not present

## 2018-08-18 DIAGNOSIS — G4733 Obstructive sleep apnea (adult) (pediatric): Secondary | ICD-10-CM | POA: Diagnosis not present

## 2018-09-09 DIAGNOSIS — M5416 Radiculopathy, lumbar region: Secondary | ICD-10-CM | POA: Diagnosis not present

## 2018-09-09 DIAGNOSIS — M5126 Other intervertebral disc displacement, lumbar region: Secondary | ICD-10-CM | POA: Diagnosis not present

## 2018-09-20 ENCOUNTER — Encounter: Payer: Self-pay | Admitting: Family Medicine

## 2018-09-20 ENCOUNTER — Ambulatory Visit (INDEPENDENT_AMBULATORY_CARE_PROVIDER_SITE_OTHER): Payer: Medicare PPO | Admitting: Family Medicine

## 2018-09-20 VITALS — BP 138/64 | HR 62 | Temp 99.0°F | Resp 16 | Ht 63.0 in | Wt 185.2 lb

## 2018-09-20 DIAGNOSIS — I209 Angina pectoris, unspecified: Secondary | ICD-10-CM

## 2018-09-20 DIAGNOSIS — M5136 Other intervertebral disc degeneration, lumbar region: Secondary | ICD-10-CM | POA: Diagnosis not present

## 2018-09-20 DIAGNOSIS — J069 Acute upper respiratory infection, unspecified: Secondary | ICD-10-CM

## 2018-09-20 DIAGNOSIS — I10 Essential (primary) hypertension: Secondary | ICD-10-CM

## 2018-09-20 DIAGNOSIS — D638 Anemia in other chronic diseases classified elsewhere: Secondary | ICD-10-CM

## 2018-09-20 DIAGNOSIS — Z23 Encounter for immunization: Secondary | ICD-10-CM

## 2018-09-20 DIAGNOSIS — G3184 Mild cognitive impairment, so stated: Secondary | ICD-10-CM

## 2018-09-20 DIAGNOSIS — Z9989 Dependence on other enabling machines and devices: Secondary | ICD-10-CM

## 2018-09-20 DIAGNOSIS — G4733 Obstructive sleep apnea (adult) (pediatric): Secondary | ICD-10-CM | POA: Diagnosis not present

## 2018-09-20 DIAGNOSIS — E782 Mixed hyperlipidemia: Secondary | ICD-10-CM | POA: Diagnosis not present

## 2018-09-20 DIAGNOSIS — M25561 Pain in right knee: Secondary | ICD-10-CM

## 2018-09-20 DIAGNOSIS — G8929 Other chronic pain: Secondary | ICD-10-CM

## 2018-09-20 DIAGNOSIS — I700 Atherosclerosis of aorta: Secondary | ICD-10-CM

## 2018-09-20 LAB — LIPID PANEL
Cholesterol: 207 mg/dL — ABNORMAL HIGH (ref <200)
HDL: 49 mg/dL — ABNORMAL LOW (ref >50)
LDL CHOLESTEROL (CALC): 128 mg/dL — AB
Non-HDL Cholesterol (Calc): 158 mg/dL (calc) — ABNORMAL HIGH (ref <130)
Total CHOL/HDL Ratio: 4.2 (calc) (ref <5.0)
Triglycerides: 179 mg/dL — ABNORMAL HIGH (ref <150)

## 2018-09-20 NOTE — Progress Notes (Signed)
Name: Jodi Kirby   MRN: [id number]    DOB: 03-12-1946   Date:09/20/2018       Progress Note  Subjective  Chief Complaint  Chief Complaint  Patient presents with  . Follow-up    4 mth f/u  . Hypertension  . Hyperlipidemia  . Hyperglycemia  . Sleep Apnea  . Gastroesophageal Reflux  . Chronic Kidney Disease  . Sinus Problem    HPI   OSA: using CPAP every night, she keeps it on for about 7 hours per night.She states she stopped taking Trazodone is tolerating CPAP well.   DDD lumbar spine: [facility] had MRI lumbar spine and had one injection done by Dr. Council Mechanichasniss, it helped and she will go back for a second injection soon. Pain is nagging on right lumbar spine, 5-6/10, and radiating to right groin area.History of spinal fusion and L3-4 .  Knee replacement surgery right: surgery was June 27 th, 2016,  she is still doing her exercises at home and is back at the gym three times weekly. She is off Meloxicam because of drop in kidney function, pain on knee varies, usually 4/10   HTN:she is back on one pill of Exforge HCTZand bp is at goal, no chest pain or palpitation, denies side effects of medication. BP is controlled here and at home, it was elevated during urgent care visit but back to normal now  Hyperlipidemia/aorta atherosclerosis: unable to tolerate statins, even in a low dose of 2.5 mg of Crestor, muscles aches were severe and she stopped medication. Dr. Mariah MillingGollan started her on Zetia 07/2018 and is tolerating it well.   GERD: under control with Omeprazole in am'sand symptoms have been well controlled.No heartburn, no regurgitation.  Unchanged   Angina Pectoris: she has CAD, mild disease, seeing Dr. Mariah MillingGollan, [facility] - last episode of chest pain, doing well now. Taking aspirin and ARB, not on beta-blocker because of bradycardia and not on statin because of intolerance, but is now on Zetia and we will recheck labs today   Osteopenia:  diagnosed in 2016 - and was started on Fosamax,she is doing well, last bone density in 2018, no side effects of medication. Unchanged   Vertigo: she has a history of BPPV, had Epley maneuver donein 2017and symptoms resolved, she had another flare 07/2017 withspinning sensation and lack of balance, sometimes associated with nausea, but no vomiting. She denies tinnitus or hearing loss. She was seen by ENT they performed another Epley maneuver and symptoms are under control again.Unchanged   Hyperglycemia: she denies polyphagia, polydipsia but has nocturia which is stable, at most twice per night. Last hgbA1C was normal , we will recheck it today   Anemia chronic disease: going on for a long time, reviewed labs with patient   Mild cognitive dysfunction: found on medicare wellness, discussed referral neurologist or consider medication but she wants to hold off. She continues to refuse having  MMS  URI: she states woke up this am with some rhinorrhea, clear and nasal congestion, no fever or chills.   Patient Active Problem List   Diagnosis Date Noted  . History of lumbar fusion 07/19/2018  . DDD (degenerative disc disease), lumbosacral 07/19/2018  . Obesity (BMI 30.0-34.9) 07/19/2018  . DOE (dyspnea on exertion) 07/11/2018  . Mild cognitive impairment 12/21/2017  . Aortic atherosclerosis (HCC) 05/15/2017  . Hyperglycemia 11/17/2016  . Chest pain 06/27/2016  . Osteopenia 05/15/2016  . Insomnia 11/08/2015  . Angina pectoris (HCC) 07/10/2015  .  Seasonal allergic rhinitis 07/09/2015  . GERD (gastroesophageal reflux disease) 07/09/2015  . Bee sting allergy 07/09/2015  . History of knee replacement procedure of right knee 04/29/2015  . History of colonic polyps 09/24/2014  . Fibrocystic breast 09/24/2014  . Arthritis of knee, degenerative 06/12/2014  . Hyperlipidemia 02/09/2013  . Atherosclerosis of native coronary artery of native heart with stable angina pectoris (HCC) 02/09/2013   . Obstructive sleep apnea on CPAP 02/09/2013  . Hypertension 02/09/2013    Past Surgical History:  Procedure Laterality Date  . ABDOMINAL HYSTERECTOMY  1970  . ANKLE SURGERY Right 1980  . BLADDER SURGERY  2012  . CARDIAC CATHETERIZATION  2003   [place]  . CARDIAC CATHETERIZATION     [place]  . CATARACT EXTRACTION W/PHACO Right 02/09/2018   Procedure: CATARACT EXTRACTION PHACO AND INTRAOCULAR LENS PLACEMENT (IOC) RIGHT;  Surgeon: Lockie Mola, MD;  Location: [facility];  Service: Ophthalmology;  Laterality: Right;  sleep apnea  . CATARACT EXTRACTION W/PHACO Left 03/09/2018   Procedure: CATARACT EXTRACTION PHACO AND INTRAOCULAR LENS PLACEMENT (IOC);  Surgeon: Lockie Mola, MD;  Location: [facility];  Service: Ophthalmology;  Laterality: Left;  IVA TOPICAL LEFT  . COLONOSCOPY  2008, 2015   Dr. Servando Snare  . CYSTOSTOMY    . KNEE SURGERY  08/18/2011   arthroscopic right  . LUMBAR FUSION     L4-5, S-1  . NECK SURGERY  2003  . TOTAL KNEE ARTHROPLASTY Right 04/29/2015   Procedure: TOTAL KNEE ARTHROPLASTY;  Surgeon: Erin Sons, MD;  Location: [facility];  Service: Orthopedics;  Laterality: Right;  . TUBAL LIGATION      Family History  Problem Relation Age of Onset  . Kidney disease Mother   . Hypertension Mother   . CAD Father   . Healthy Sister   . Kidney cancer Neg Hx   . Bladder Cancer Neg Hx     Social History   Socioeconomic History  . Marital status: Married    Spouse name: Morris  . Number of children: 4  . Years of education: Not on file  . Highest education level: 11th grade  Occupational History  . Occupation: Retired  Engineer, production  . Financial resource strain: Not hard at all  . Food insecurity:    Worry: Never true    Inability: Never true  . Transportation needs:    Medical: No    Non-medical: No  Tobacco Use  . Smoking status: Never Smoker  . Smokeless tobacco: Never Used  . Tobacco comment: smoking cessation materials  not required  Substance and Sexual Activity  . Alcohol use: No    Alcohol/week: 0.0 standard drinks  . Drug use: No  . Sexual activity: Not Currently    Partners: Male    Birth control/protection: None  Lifestyle  . Physical activity:    Days per week: 3 days    Minutes per session: 60 min  . Stress: Not at all  Relationships  . Social connections:    Talks on phone: More than three times a week    Gets together: More than three times a week    Attends religious service: More than 4 times per year    Active member of club or organization: Yes    Attends meetings of clubs or organizations: More than 4 times per year    Relationship status: Married  . Intimate partner violence:    Fear of current or ex partner: No    Emotionally abused: No  Physically abused: No    Forced sexual activity: No  Other Topics Concern  . Not on file  Social History Narrative  . Not on file     Current Outpatient Medications:  .  acetaminophen (TYLENOL) 650 MG CR tablet, Take 1 tablet daily as needed by mouth., Disp: , Rfl:  .  alendronate (FOSAMAX) 70 MG tablet, TAKE 1 TABLET WEEKLY, Disp: 12 tablet, Rfl: 3 .  amLODIPine-Valsartan-HCTZ 10-160-12.5 MG TABS, Take 1 tablet by mouth daily., Disp: , Rfl:  .  aspirin EC 81 MG tablet, Take 81 mg by mouth every morning., Disp: , Rfl:  .  Cholecalciferol (VITAMIN D3) 1000 UNITS CAPS, Take 1,000 Units by mouth every morning. , Disp: , Rfl:  .  EPINEPHrine (EPIPEN 2-PAK) 0.3 mg/0.3 mL DEVI, Inject 0.3 mg into the muscle as needed. , Disp: , Rfl:  .  ezetimibe (ZETIA) 10 MG tablet, Take 1 tablet (10 mg total) by mouth daily., Disp: 90 tablet, Rfl: 3 .  fluticasone (FLONASE) 50 MCG/ACT nasal spray, USE TWO SPRAY(S) IN EACH NOSTRIL AT BEDTIME, Disp: 48 g, Rfl: 1 .  loratadine (CLARITIN) 10 MG tablet, Take 1 tablet (10 mg total) by mouth daily. (Patient taking differently: Take 10 mg by mouth daily as needed for allergies. ), Disp: 30 tablet, Rfl: 5 .   meclizine (ANTIVERT) 25 MG tablet, Take 25 mg by mouth 3 (three) times daily as needed for dizziness., Disp: , Rfl:  .  Multiple Vitamins-Minerals (CENTRUM SILVER PO), Take 1 tablet by mouth every morning. , Disp: , Rfl:  .  nitroGLYCERIN (NITROSTAT) 0.4 MG SL tablet, Place 1 tablet (0.4 mg total) under the tongue every 5 (five) minutes as needed for chest pain., Disp: 25 tablet, Rfl: 0 .  omeprazole (PRILOSEC) 40 MG capsule, Take 1 capsule (40 mg total) by mouth daily., Disp: 90 capsule, Rfl: 3 .  traZODone (DESYREL) 50 MG tablet, Take 1 tablet (50 mg total) by mouth at bedtime as needed for sleep., Disp: 90 tablet, Rfl: 0  Allergies  Allergen Reactions  . Niaspan [Niacin Er] Other (See Comments)    Patient states she can't remember the reaction because it so long ago.  . Oxycodone-Acetaminophen Nausea Only  . Statins Other (See Comments)    Muscle and joint pain.    I personally reviewed active problem list, medication list, allergies, family history, social history with the patient/caregiver today.   ROS  Constitutional: Negative for fever or weight change.  Respiratory: Negative for cough and shortness of breath.   Cardiovascular: Negative for chest pain or palpitations.  Gastrointestinal: Negative for abdominal pain, no bowel changes.  Musculoskeletal: Positive  for gait problem and  joint swelling.  Skin: Negative for rash.  Neurological: Negative for dizziness or headache.  No other specific complaints in a complete review of systems (except as listed in HPI above).  Objective  Vitals:   09/20/18 0817  BP: 138/64  Pulse: 62  Resp: 16  Temp: 99 F (37.2 C)  TempSrc: Oral  SpO2: 98%  Weight: 185 lb 3.2 oz (84 kg)  Height: 5\' 3"  (1.6 m)    Body mass index is 32.81 kg/m.  Physical Exam  Constitutional: Patient appears well-developed and well-nourished. Obese  No distress.  HEENT: head atraumatic, normocephalic, pupils equal and reactive to light,neck supple,  throat within normal limits Cardiovascular: Normal rate, regular rhythm and normal heart sounds.  No murmur heard. No BLE edema. Pulmonary/Chest: Effort normal and breath sounds normal. No respiratory distress.  Abdominal: Soft.  There is no tenderness. Psychiatric: Patient has a normal mood and affect. behavior is normal. Judgment and thought content normal.  PHQ2/9: Depression screen [facility] 2/9 09/20/2018 05/20/2018 12/21/2017 05/17/2017 01/25/2017  Decreased Interest 0 0 0 0 0  Down, Depressed, Hopeless 0 0 0 0 0  PHQ - 2 Score 0 0 0 0 0  Altered sleeping 0 0 - - -  Tired, decreased energy 0 0 - - -  Change in appetite 0 0 - - -  Feeling bad or failure about yourself  0 0 - - -  Trouble concentrating 0 0 - - -  Moving slowly or fidgety/restless 0 0 - - -  Suicidal thoughts 0 0 - - -  PHQ-9 Score 0 0 - - -  Difficult doing work/chores Not difficult at all - - - -     Fall Risk: Fall Risk  09/20/2018 05/20/2018 01/18/2018 12/21/2017 09/20/2017  Falls in the past year? 0 No No No No    Functional Status Survey: Is the patient deaf or have difficulty hearing?: No Does the patient have difficulty seeing, even when wearing glasses/contacts?: No Does the patient have difficulty concentrating, remembering, or making decisions?: No Does the patient have difficulty walking or climbing stairs?: No Does the patient have difficulty dressing or bathing?: No Does the patient have difficulty doing errands alone such as visiting a doctor's office or shopping?: No    Assessment & Plan  1. Mixed hyperlipidemia  - Lipid panel  2. Essential hypertension  At goal   3. Angina pectoris (HCC)  On medical management   4. Aortic atherosclerosis (HCC)  On zetia, and aspirin   5. Obstructive sleep apnea on CPAP  Continue CPAP machine  6. Mild cognitive impairment  Refuses further testing  7. Anemia of chronic disease  She prefers holding off to check labs next visit   8. DDD  (degenerative disc disease), lumbar  Seeing Dr. Council Mechanic   9. Chronic pain of right knee  Under the care of Dr. Gavin Potters   10. URI, acute  She states started this morning, rhinorrhea, she states she will return for flu shot

## 2018-10-12 DIAGNOSIS — Z1231 Encounter for screening mammogram for malignant neoplasm of breast: Secondary | ICD-10-CM | POA: Diagnosis not present

## 2018-10-19 DIAGNOSIS — Z961 Presence of intraocular lens: Secondary | ICD-10-CM | POA: Diagnosis not present

## 2018-10-21 DIAGNOSIS — M5126 Other intervertebral disc displacement, lumbar region: Secondary | ICD-10-CM | POA: Diagnosis not present

## 2018-10-21 DIAGNOSIS — M5416 Radiculopathy, lumbar region: Secondary | ICD-10-CM | POA: Diagnosis not present

## 2018-11-15 DIAGNOSIS — M545 Low back pain: Secondary | ICD-10-CM | POA: Diagnosis not present

## 2018-11-15 DIAGNOSIS — E669 Obesity, unspecified: Secondary | ICD-10-CM | POA: Diagnosis not present

## 2018-11-15 DIAGNOSIS — Z981 Arthrodesis status: Secondary | ICD-10-CM | POA: Diagnosis not present

## 2018-11-15 DIAGNOSIS — M5137 Other intervertebral disc degeneration, lumbosacral region: Secondary | ICD-10-CM | POA: Diagnosis not present

## 2018-11-15 DIAGNOSIS — G8929 Other chronic pain: Secondary | ICD-10-CM | POA: Diagnosis not present

## 2018-12-22 ENCOUNTER — Encounter: Payer: Self-pay | Admitting: Family Medicine

## 2018-12-22 ENCOUNTER — Ambulatory Visit (INDEPENDENT_AMBULATORY_CARE_PROVIDER_SITE_OTHER): Payer: Medicare PPO | Admitting: Family Medicine

## 2018-12-22 VITALS — BP 130/68 | HR 80 | Temp 98.4°F | Resp 16 | Ht 63.0 in | Wt 188.4 lb

## 2018-12-22 DIAGNOSIS — M51369 Other intervertebral disc degeneration, lumbar region without mention of lumbar back pain or lower extremity pain: Secondary | ICD-10-CM

## 2018-12-22 DIAGNOSIS — I10 Essential (primary) hypertension: Secondary | ICD-10-CM | POA: Diagnosis not present

## 2018-12-22 DIAGNOSIS — N183 Chronic kidney disease, stage 3 unspecified: Secondary | ICD-10-CM

## 2018-12-22 DIAGNOSIS — I700 Atherosclerosis of aorta: Secondary | ICD-10-CM | POA: Diagnosis not present

## 2018-12-22 DIAGNOSIS — I209 Angina pectoris, unspecified: Secondary | ICD-10-CM | POA: Diagnosis not present

## 2018-12-22 DIAGNOSIS — I129 Hypertensive chronic kidney disease with stage 1 through stage 4 chronic kidney disease, or unspecified chronic kidney disease: Secondary | ICD-10-CM | POA: Insufficient documentation

## 2018-12-22 DIAGNOSIS — M5136 Other intervertebral disc degeneration, lumbar region: Secondary | ICD-10-CM

## 2018-12-22 DIAGNOSIS — G4733 Obstructive sleep apnea (adult) (pediatric): Secondary | ICD-10-CM

## 2018-12-22 DIAGNOSIS — M8580 Other specified disorders of bone density and structure, unspecified site: Secondary | ICD-10-CM

## 2018-12-22 DIAGNOSIS — G3184 Mild cognitive impairment, so stated: Secondary | ICD-10-CM

## 2018-12-22 DIAGNOSIS — R739 Hyperglycemia, unspecified: Secondary | ICD-10-CM

## 2018-12-22 DIAGNOSIS — Z9989 Dependence on other enabling machines and devices: Secondary | ICD-10-CM

## 2018-12-22 DIAGNOSIS — K219 Gastro-esophageal reflux disease without esophagitis: Secondary | ICD-10-CM

## 2018-12-22 MED ORDER — OMEPRAZOLE 40 MG PO CPDR: 40.0000 mg | DELAYED_RELEASE_CAPSULE | Freq: Every day | ORAL | 3 refills | Status: DC

## 2018-12-22 MED ORDER — ALENDRONATE SODIUM 70 MG PO TABS: ORAL_TABLET | ORAL | 3 refills | Status: DC

## 2018-12-22 NOTE — Progress Notes (Signed)
Name: Jodi Kirby   MRN: [id number]    DOB: 1946-03-03   Date:12/22/2018       Progress Note  Subjective  Chief Complaint  Chief Complaint  Patient presents with  . Medication Refill  . Hypertension  . Hyperlipidemia  . Chronic Kidney Disease  . Gastroesophageal Reflux  . Sleep Apnea    HPI  OSA: using CPAP every night, she keeps it on for about 7 hours per night.She states she stopped taking Trazodone is tolerating CPAP well, she still has some Trazodone left at home   DDD lumbar spine: [facility] had [facility] lumbar spine and had steroid  injection sdone by Dr. Council Mechanichasniss, it helped and she will go back for a second injection soon. Pain is nagging on right lumbar spine, 5-6/10, and radiating to right groin area.History of spinal fusion and L3-4 . Dr. Yves Dillhasnis sent her to Ortho last month, and she was given reassurance and to monitor. She does not want surgery   Knee replacement surgery right: surgery was June 27 th, 2016,  she is still doing her exercises at home and is back at the gym three times weekly. Pain varies and is tolerable, average 3/10 occasionally goes up to 5-6/10. Worse in am's when she first gets up  HTN with CKI :she is back on one pill of Exforge HCTZ, no recent episodes of chest pain or palpitation. She has good urine output and not pruritis    Hyperlipidemia/aorta atherosclerosis: unable to tolerate statins, even in a low dose of 2.5 mg of Crestor, muscles aches were severe and she stopped medication. Dr. Mariah MillingGollan started her on Zetia 07/2018 and is tolerating it well. Unchanged   GERD: under control with Omeprazole in am'sand symptoms have been well controlled.No heartburn, no regurgitation. She also eats GERD appropriate diet   Angina Pectoris: she has CAD, mild disease, seeing Dr. Mariah MillingGollan. Taking aspirin and ARB, not on beta-blocker because of bradycardia and not on statin because of intolerance, but is now on Zetia and last LDL had a mild  improvement  Osteopenia: diagnosed in 2016 - and was started on Fosamax,she is doing well, last bone density in 2018, no side effects of medication. -2.4 spine -2.2 of femur   Hyperglycemia: she denies polyphagia, polydipsia but has nocturia which is stable, at most twice per night.Last hgbA1C 5.5%  Mild cognitive dysfunction: found on medicare wellness, discussed referral neurologist but she wants to hold off. Advised cross work puzzles, math games and also early signs of dementia  Patient Active Problem List   Diagnosis Date Noted  . History of lumbar fusion 07/19/2018  . DDD (degenerative disc disease), lumbosacral 07/19/2018  . Obesity (BMI 30.0-34.9) 07/19/2018  . DOE (dyspnea on exertion) 07/11/2018  . Mild cognitive impairment 12/21/2017  . Aortic atherosclerosis (HCC) 05/15/2017  . Hyperglycemia 11/17/2016  . Chest pain 06/27/2016  . Osteopenia 05/15/2016  . Insomnia 11/08/2015  . Angina pectoris (HCC) 07/10/2015  . Seasonal allergic rhinitis 07/09/2015  . GERD (gastroesophageal reflux disease) 07/09/2015  . Bee sting allergy 07/09/2015  . History of knee replacement procedure of right knee 04/29/2015  . History of colonic polyps 09/24/2014  . Fibrocystic breast 09/24/2014  . Arthritis of knee, degenerative 06/12/2014  . Hyperlipidemia 02/09/2013  . Atherosclerosis of native coronary artery of native heart with stable angina pectoris (HCC) 02/09/2013  . Obstructive sleep apnea on CPAP 02/09/2013  . Hypertension 02/09/2013    Past Surgical History:  Procedure Laterality Date  . ABDOMINAL  HYSTERECTOMY  1970  . ANKLE SURGERY Right 1980  . BLADDER SURGERY  2012  . CARDIAC CATHETERIZATION  2003   [facility]  . CARDIAC CATHETERIZATION     [facility]  . CATARACT EXTRACTION W/PHACO Right 02/09/2018   Procedure: CATARACT EXTRACTION PHACO AND INTRAOCULAR LENS PLACEMENT (IOC) RIGHT;  Surgeon: Lockie MolaBrasington, Chadwick, MD;  [facility];  Service: Ophthalmology;   Laterality: Right;  sleep apnea  . CATARACT EXTRACTION W/PHACO Left 03/09/2018   Procedure: CATARACT EXTRACTION PHACO AND INTRAOCULAR LENS PLACEMENT (IOC);  Surgeon: Lockie MolaBrasington, Chadwick, MD;  [facility];  Service: Ophthalmology;  Laterality: Left;  IVA TOPICAL LEFT  . COLONOSCOPY  2008, 2015   Dr. Servando SnareWohl  . CYSTOSTOMY    . KNEE SURGERY  08/18/2011   arthroscopic right  . LUMBAR FUSION     L4-5, S-1  . NECK SURGERY  2003  . TOTAL KNEE ARTHROPLASTY Right 04/29/2015   Procedure: TOTAL KNEE ARTHROPLASTY;  Surgeon: Erin SonsHarold [facility], MD;  Location: [facility];  Service: Orthopedics;  Laterality: Right;  . TUBAL LIGATION      Family History  Problem Relation Age of Onset  . Kidney disease Mother   . Hypertension Mother   . CAD Father   . Healthy Sister   . Kidney cancer Neg Hx   . Bladder Cancer Neg Hx     Social History   Socioeconomic History  . Marital status: Married    Spouse name: Morris  . Number of children: 4  . Years of education: Not on file  . Highest education level: 11th grade  Occupational History  . Occupation: Retired  Engineer, productionocial Needs  . Financial resource strain: Not hard at all  . Food insecurity:    Worry: Never true    Inability: Never true  . Transportation needs:    Medical: No    Non-medical: No  Tobacco Use  . Smoking status: Never Smoker  . Smokeless tobacco: Never Used  . Tobacco comment: smoking cessation materials not required  Substance and Sexual Activity  . Alcohol use: No    Alcohol/week: 0.0 standard drinks  . Drug use: No  . Sexual activity: Not Currently    Partners: Male    Birth control/protection: None  Lifestyle  . Physical activity:    Days per week: 3 days    Minutes per session: 60 min  . Stress: Not at all  Relationships  . Social connections:    Talks on phone: More than three times a week    Gets together: More than three times a week    Attends religious service: More than 4 times per year    Active  member of club or organization: Yes    Attends meetings of clubs or organizations: More than 4 times per year    Relationship status: Married  . Intimate partner violence:    Fear of current or ex partner: No    Emotionally abused: No    Physically abused: No    Forced sexual activity: No  Other Topics Concern  . Not on file  Social History Narrative  . Not on file     Current Outpatient Medications:  .  acetaminophen (TYLENOL) 650 MG CR tablet, Take 1 tablet daily as needed by mouth., Disp: , Rfl:  .  alendronate (FOSAMAX) 70 MG tablet, TAKE 1 TABLET WEEKLY, Disp: 12 tablet, Rfl: 3 .  amLODIPine-Valsartan-HCTZ 10-160-12.5 MG TABS, Take 1 tablet by mouth daily., Disp: , Rfl:  .  aspirin  EC 81 MG tablet, Take 81 mg by mouth every morning., Disp: , Rfl:  .  Cholecalciferol (VITAMIN D3) 1000 UNITS CAPS, Take 1,000 Units by mouth every morning. , Disp: , Rfl:  .  EPINEPHrine (EPIPEN 2-PAK) 0.3 mg/0.3 mL DEVI, Inject 0.3 mg into the muscle as needed. , Disp: , Rfl:  .  ezetimibe (ZETIA) 10 MG tablet, Take 1 tablet (10 mg total) by mouth daily., Disp: 90 tablet, Rfl: 3 .  fluticasone (FLONASE) 50 MCG/ACT nasal spray, USE TWO SPRAY(S) IN EACH NOSTRIL AT BEDTIME, Disp: 48 g, Rfl: 1 .  loratadine (CLARITIN) 10 MG tablet, Take 1 tablet (10 mg total) by mouth daily. (Patient taking differently: Take 10 mg by mouth daily as needed for allergies. ), Disp: 30 tablet, Rfl: 5 .  meclizine (ANTIVERT) 25 MG tablet, Take 25 mg by mouth 3 (three) times daily as needed for dizziness., Disp: , Rfl:  .  Multiple Vitamins-Minerals (CENTRUM SILVER PO), Take 1 tablet by mouth every morning. , Disp: , Rfl:  .  nitroGLYCERIN (NITROSTAT) 0.4 MG SL tablet, Place 1 tablet (0.4 mg total) under the tongue every 5 (five) minutes as needed for chest pain., Disp: 25 tablet, Rfl: 0 .  omeprazole (PRILOSEC) 40 MG capsule, Take 1 capsule (40 mg total) by mouth daily., Disp: 90 capsule, Rfl: 3 .  traZODone (DESYREL) 50 MG  tablet, Take 1 tablet (50 mg total) by mouth at bedtime as needed for sleep., Disp: 90 tablet, Rfl: 0  Allergies  Allergen Reactions  . Niaspan [Niacin Er] Other (See Comments)    Patient states she can't remember the reaction because it so long ago.  . Oxycodone-Acetaminophen Nausea Only  . Statins Other (See Comments)    Muscle and joint pain.    I personally reviewed active problem list, medication list, allergies, family history, social history with the patient/caregiver today.   ROS  Constitutional: Negative for fever or weight change.  Respiratory: Negative for cough and shortness of breath.   Cardiovascular: Negative for chest pain or palpitations.  Gastrointestinal: Negative for abdominal pain, no bowel changes.  Musculoskeletal: Positive for intermittent gait problem and right knee  joint swelling - history of TKA .  Skin: Negative for rash.  Neurological: Negative for dizziness or headache.  No other specific complaints in a complete review of systems (except as listed in HPI above).  Objective  Vitals:   12/22/18 1024  BP: 130/68  Pulse: 80  Resp: 16  Temp: 98.4 F (36.9 C)  TempSrc: Oral  SpO2: 99%  Weight: 188 lb 6.4 oz (85.5 kg)  Height: 5\' 3"  (1.6 m)    Body mass index is 33.37 kg/m.  Physical Exam  Constitutional: Patient appears well-developed and well-nourished. Obese  No distress.  HEENT: head atraumatic, normocephalic, pupils equal and reactive to light,  neck supple, throat within normal limits Cardiovascular: Normal rate, regular rhythm and normal heart sounds.  No murmur heard. No BLE edema. Pulmonary/Chest: Effort normal and breath sounds normal. No respiratory distress. Abdominal: Soft.  There is no tenderness. Muscular Skeletal: pain during palpation of lumbar spine, negative straight leg raise  Psychiatric: Patient has a normal mood and affect. behavior is normal. Judgment and thought content normal.  PHQ2/9: Depression screen [facility] 2/9  09/20/2018 05/20/2018 12/21/2017 05/17/2017 01/25/2017  Decreased Interest 0 0 0 0 0  Down, Depressed, Hopeless 0 0 0 0 0  PHQ - 2 Score 0 0 0 0 0  Altered sleeping 0 0 - - -  Tired, decreased energy 0 0 - - -  Change in appetite 0 0 - - -  Feeling bad or failure about yourself  0 0 - - -  Trouble concentrating 0 0 - - -  Moving slowly or fidgety/restless 0 0 - - -  Suicidal thoughts 0 0 - - -  PHQ-9 Score 0 0 - - -  Difficult doing work/chores Not difficult at all - - - -     Fall Risk: Fall Risk  12/22/2018 12/22/2018 09/20/2018 05/20/2018 01/18/2018  Falls in the past year? 0 0 0 No No  Number falls in past yr: 0 - - - -  Injury with Fall? 0 - - - -     Functional Status Survey: Is the patient deaf or have difficulty hearing?: No Does the patient have difficulty seeing, even when wearing glasses/contacts?: Yes Does the patient have difficulty concentrating, remembering, or making decisions?: No Does the patient have difficulty walking or climbing stairs?: No Does the patient have difficulty dressing or bathing?: No Does the patient have difficulty doing errands alone such as visiting a doctor's office or shopping?: No    Assessment & Plan  1. Osteopenia, unspecified location  - alendronate (FOSAMAX) 70 MG tablet; TAKE 1 TABLET WEEKLY  Dispense: 12 tablet; Refill: 3  2. Gastroesophageal reflux disease without esophagitis  - omeprazole (PRILOSEC) 40 MG capsule; Take 1 capsule (40 mg total) by mouth daily.  Dispense: 90 capsule; Refill: 3  3. Essential hypertension  At goal   4. Obstructive sleep apnea on CPAP  Very compliant   5. Aortic atherosclerosis (HCC)  On Zetia, cannot tolerate statins  6. Angina pectoris [facility])  Doing well on medical management   7. DDD (degenerative disc disease), lumbar  Under the care of Ortho and psyatrist and doing better  8. Mild cognitive impairment  Stable, she will have MMS done during medicare well   9. Stage 3 chronic  kidney disease (HCC)  stable  10. Hyperglycemia  Recheck yearly

## 2018-12-23 ENCOUNTER — Ambulatory Visit: Payer: Medicare PPO | Admitting: Family Medicine

## 2019-01-26 ENCOUNTER — Telehealth: Payer: Self-pay

## 2019-01-26 ENCOUNTER — Ambulatory Visit: Payer: Self-pay

## 2019-01-26 NOTE — Telephone Encounter (Signed)
Left message for patient regarding need for AWV and available to complete telephonically. Requested patient to contact the office at [phone] or my direct line at [phone] to schedule telephone visit.    Telephone AWV's currently available to schedule only during the next 2-3 weeks during Covid-19 pandemic.    Thank you! 

## 2019-02-14 ENCOUNTER — Encounter: Payer: Self-pay | Admitting: General Surgery

## 2019-02-14 ENCOUNTER — Other Ambulatory Visit: Payer: Self-pay

## 2019-02-14 ENCOUNTER — Ambulatory Visit (INDEPENDENT_AMBULATORY_CARE_PROVIDER_SITE_OTHER): Payer: Medicare PPO | Admitting: General Surgery

## 2019-02-14 VITALS — BP 168/77 | HR 78 | Temp 97.8°F | Resp 14 | Ht 63.0 in | Wt 188.0 lb

## 2019-02-14 DIAGNOSIS — R0789 Other chest pain: Secondary | ICD-10-CM | POA: Diagnosis not present

## 2019-02-14 NOTE — Patient Instructions (Signed)
Return as needed.. The patient is aware to use a heating pad as needed for comfort. The patient is aware to call back for any questions or concerns.  

## 2019-02-14 NOTE — Progress Notes (Signed)
Patient ID: Jodi Kirby, female   DOB: [birth year], 73 y.o.   MRN: [id number]  Chief Complaint  Patient presents with  . Other    left breast pain     HPI Jodi Kirby is a 73 y.o. female here today for a evaluation of left breast pain . She states it feels like a burning pain. Mammogram done in December 2019. After the mammogram that's when the pain started .  The patient has not been directly aware of any direct correlation between physical activity and the pain, but she is more likely to be symptomatic in the evening.  She has also noticing some discomfort at night if she rolls over onto the left side.  Patient previously followed by Dr. Evette Cristal for fibrocystic breast issues. HPI  Past Medical History:  Diagnosis Date  . Allergic rhinitis, cause unspecified   . Coronary artery disease   . Dysmetabolic syndrome X   . Dysuria   . Esophageal reflux   . Essential hypertension, benign   . HNP (herniated nucleus pulposus), lumbar    Dr. Yves Dill [facility])  . Hyperlipidemia   . Lumbago   . Lumbar radiculitis    Dr. Yves Dill [facility])  . Lump or mass in breast   . Myalgia and myositis, unspecified   . Obstructive sleep apnea    CPAP  . Osteoarthrosis, unspecified whether generalized or localized, lower leg   . Other abnormal glucose   . Other and unspecified angina pectoris   . 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 Toxic effect of venom(989.5)   . Unspecified iridocyclitis   . Unspecified vitamin D deficiency   . Vertigo 2018   1 episode    Past Surgical History:  Procedure Laterality Date  . ABDOMINAL HYSTERECTOMY  1970  . ANKLE SURGERY Right 1980  . BLADDER SURGERY  2012  . CARDIAC CATHETERIZATION  2003   [facility]  . CARDIAC CATHETERIZATION     [facility]  . CATARACT EXTRACTION W/PHACO Right 02/09/2018   Procedure: CATARACT EXTRACTION PHACO AND INTRAOCULAR LENS PLACEMENT (IOC) RIGHT;  Surgeon: Lockie Mola, MD;   Location: [facility];  Service: Ophthalmology;  Laterality: Right;  sleep apnea  . CATARACT EXTRACTION W/PHACO Left 03/09/2018   Procedure: CATARACT EXTRACTION PHACO AND INTRAOCULAR LENS PLACEMENT (IOC);  Surgeon: Lockie Mola, MD;  Location: [facility];  Service: Ophthalmology;  Laterality: Left;  IVA TOPICAL LEFT  . COLONOSCOPY  2008, 2015   Dr. Servando Snare  . CYSTOSTOMY    . KNEE SURGERY  08/18/2011   arthroscopic right  . LUMBAR FUSION     L4-5, S-1  . NECK SURGERY  2003  . TOTAL KNEE ARTHROPLASTY Right 04/29/2015   Procedure: TOTAL KNEE ARTHROPLASTY;  Surgeon: Erin Sons, MD;  Location: [facility];  Service: Orthopedics;  Laterality: Right;  . TUBAL LIGATION      Family History  Problem Relation Age of Onset  . Kidney disease Mother   . Hypertension Mother   . CAD Father   . Healthy Sister   . Kidney cancer Neg Hx   . Bladder Cancer Neg Hx     Social History Social History   Tobacco Use  . Smoking status: Never Smoker  . Smokeless tobacco: Never Used  . Tobacco comment: smoking cessation materials not required  Substance Use Topics  . Alcohol use: No    Alcohol/week: 0.0 standard drinks  . Drug use: No    Allergies  Allergen Reactions  . Niaspan [Niacin Er] Other (See Comments)    Patient states she can't remember the reaction because it so long ago.  . Oxycodone-Acetaminophen Nausea Only  . Statins Other (See Comments)    Muscle and joint pain.    Current Outpatient Medications  Medication Sig Dispense Refill  . acetaminophen (TYLENOL) 650 MG CR tablet Take 1 tablet daily as needed by mouth.    Marland Kitchen alendronate (FOSAMAX) 70 MG tablet TAKE 1 TABLET WEEKLY 12 tablet 3  . amLODIPine-Valsartan-HCTZ 10-160-12.5 MG TABS Take 1 tablet by mouth daily.    Marland Kitchen aspirin EC 81 MG tablet Take 81 mg by mouth every morning.    . Cholecalciferol (VITAMIN D3) 1000 UNITS CAPS Take 1,000 Units by mouth every morning.     Marland Kitchen EPINEPHrine (EPIPEN 2-PAK) 0.3 mg/0.3  mL DEVI Inject 0.3 mg into the muscle as needed.     . ezetimibe (ZETIA) 10 MG tablet Take 1 tablet (10 mg total) by mouth daily. 90 tablet 3  . fluticasone (FLONASE) 50 MCG/ACT nasal spray USE TWO SPRAY(S) IN EACH NOSTRIL AT BEDTIME 48 g 1  . loratadine (CLARITIN) 10 MG tablet Take 1 tablet (10 mg total) by mouth daily. (Patient taking differently: Take 10 mg by mouth daily as needed for allergies. ) 30 tablet 5  . meclizine (ANTIVERT) 25 MG tablet Take 25 mg by mouth 3 (three) times daily as needed for dizziness.    . Multiple Vitamins-Minerals (CENTRUM SILVER PO) Take 1 tablet by mouth every morning.     . nitroGLYCERIN (NITROSTAT) 0.4 MG SL tablet Place 1 tablet (0.4 mg total) under the tongue every 5 (five) minutes as needed for chest pain. 25 tablet 0  . omeprazole (PRILOSEC) 40 MG capsule Take 1 capsule (40 mg total) by mouth daily. 90 capsule 3  . traZODone (DESYREL) 50 MG tablet Take 1 tablet (50 mg total) by mouth at bedtime as needed for sleep. 90 tablet 0   No current facility-administered medications for this visit.     Review of Systems Review of Systems  Constitutional: Negative.   Respiratory: Negative.   Cardiovascular: Negative.   Endocrine: Negative.   Genitourinary: Negative.   Musculoskeletal: Positive for back pain and myalgias.  Neurological: Negative.   Hematological: Negative.   Psychiatric/Behavioral: Negative.     Blood pressure (!) 168/77, pulse 78, temperature 97.8 F (36.6 C), temperature source Skin, resp. rate 14, height 5\' 3"  (1.6 m), weight 188 lb (85.3 kg), SpO2 98 %.  Physical Exam Physical Exam Constitutional:      Appearance: She is well-developed.  Eyes:     General: No scleral icterus.    Conjunctiva/sclera: Conjunctivae normal.  Neck:     Musculoskeletal: Neck supple.  Cardiovascular:     Rate and Rhythm: Normal rate and regular rhythm.     Heart sounds: Normal heart sounds.  Pulmonary:     Effort: Pulmonary effort is normal.      Breath sounds: Normal breath sounds.    Chest:     Breasts:        Right: No inverted nipple, mass, nipple discharge, skin change or tenderness.        Left: No inverted nipple, mass, nipple discharge, skin change or tenderness.    Lymphadenopathy:     Cervical: No cervical adenopathy.  Skin:    General: Skin is warm and dry.  Neurological:     Mental Status: She is alert and oriented to person, place, and time.  Data Reviewed Bilateral mammograms dated October 12, 2018 were not available for review, but the films have been requested and will be reviewed when available.  The report suggests scattered fibroglandular density without interval change.  BI-RADS-1.  Assessment Left chest wall pain without clear breast pathology.  Plan Symptomatic measures including the use of local heat and judicious use of Tylenol or nonsteroidals was encouraged.  Careful lifting and strenuous activities to minimize ongoing muscle injury. Return as needed.The patient is aware to use a heating pad as needed for comfort.The patient is aware to call back for any questions or concerns.  HPI, Physical Exam, Assessment and Plan have been scribed under the direction and in the presence of Donnalee CurryJeffrey Temiloluwa Laredo, MD.  Ples SpecterJessica Qualls, CMA  I have completed the exam and reviewed the above documentation for accuracy and completeness.  I agree with the above.  [employer] has been used and any errors in dictation or transcription are unintentional.  Donnalee CurryJeffrey Nyema Hachey, M.D., F.A.C.S.  Merrily PewJeffrey W Zianne Schubring 02/15/2019, 9:32 AM

## 2019-03-12 ENCOUNTER — Other Ambulatory Visit: Payer: Self-pay | Admitting: Family Medicine

## 2019-03-12 DIAGNOSIS — M8580 Other specified disorders of bone density and structure, unspecified site: Secondary | ICD-10-CM

## 2019-03-20 DIAGNOSIS — M545 Low back pain: Secondary | ICD-10-CM | POA: Diagnosis not present

## 2019-03-20 DIAGNOSIS — G8929 Other chronic pain: Secondary | ICD-10-CM | POA: Diagnosis not present

## 2019-03-20 DIAGNOSIS — Z981 Arthrodesis status: Secondary | ICD-10-CM | POA: Diagnosis not present

## 2019-04-05 ENCOUNTER — Telehealth: Payer: Self-pay

## 2019-04-05 NOTE — Telephone Encounter (Signed)
Yes that is what the patient is expecting.( She called it a new medication, but this is a dose change per the note of 07/11/18.) Clarified with patient.

## 2019-04-05 NOTE — Telephone Encounter (Signed)
Patient seen on 07/11/18 by Dr. Mariah Milling.  - The patient's medication list stated at that time that she was taking: amlodipine-valsartan-hctz 5-160-25 mg once daily.  - per MD office note from 07/11/18: Essential hypertension - Plan: EKG 12-Lead Blood pressure is well controlled on today's visit. No changes made to the medications. Stable  - per AVS instructions from 07/11/18: Medication Instructions:   Please start zetia one a day  When you run ouf of the current medication Consider changing to the amlodipine-valsartan-HCTZ 10/160/12.5   Can you please call the patient to verify: 1) is she still taking amlodipine-valsartan-hctz 5-160-25 mg once daily?  2) has she been checking her BP at home, if so what kind of readings is she getting?

## 2019-04-05 NOTE — Telephone Encounter (Signed)
Please review. Needs directions, the amlodipine-Valsartan-Hctz was to be started when patient finished her current supply. Thank you.

## 2019-04-05 NOTE — Telephone Encounter (Signed)
Patient is currently taking I/2 tablet of the Amlodipine -Valsartan-Hctz 5-160-25 mg due to having bad leg cramps with the whole tablet. She states her bp has been averaging 140 /76 with no cramps while taking this dose. She states that if she takes a whole tablet she has terrible leg cramps and thinks Dr. Mariah Milling was going to change her medication so she could take a whole tablet. She also requested a 90 day supply.

## 2019-04-05 NOTE — Telephone Encounter (Signed)
Dr. Mariah Milling, see note below please.  Do we need to send in the RX for: Amlodipine- valsartan- hctz for 10/160/12.5 mg once daily?  Please advise.   Thank you!

## 2019-04-06 ENCOUNTER — Other Ambulatory Visit: Payer: Self-pay

## 2019-04-06 MED ORDER — AMLODIPINE-VALSARTAN-HCTZ 10-160-12.5 MG PO TABS: 1.0000 | ORAL_TABLET | Freq: Every day | ORAL | 3 refills | Status: DC

## 2019-04-06 NOTE — Telephone Encounter (Signed)
amLODIPine-Valsartan-HCTZ 10-160-12.5 MG TABS 30 tablet 3 04/06/2019    Sig - Route: Take 1 tablet by mouth daily. - Oral   Sent to pharmacy as: amLODIPine-Valsartan-HCTZ 10-160-12.5 MG Tab   E-Prescribing Status: Receipt confirmed by pharmacy (04/06/2019 12:10 PM EDT)   Pharmacy   [id number] - [place], [place] - [address] [place] [address]

## 2019-04-06 NOTE — Telephone Encounter (Signed)
Pt would like to make sure her medications that were called in yesterday, were sent to [facility] in [facility]

## 2019-04-06 NOTE — Telephone Encounter (Signed)
amlodipine-valsartan-HCTZ 10/160/12.5 is fine

## 2019-05-02 ENCOUNTER — Ambulatory Visit (INDEPENDENT_AMBULATORY_CARE_PROVIDER_SITE_OTHER): Payer: Medicare PPO

## 2019-05-02 ENCOUNTER — Ambulatory Visit (INDEPENDENT_AMBULATORY_CARE_PROVIDER_SITE_OTHER): Payer: Medicare PPO | Admitting: Family Medicine

## 2019-05-02 ENCOUNTER — Encounter: Payer: Self-pay | Admitting: Family Medicine

## 2019-05-02 ENCOUNTER — Other Ambulatory Visit: Payer: Self-pay

## 2019-05-02 VITALS — BP 130/80 | HR 59 | Temp 98.4°F | Resp 16 | Ht 63.0 in | Wt 184.8 lb

## 2019-05-02 DIAGNOSIS — M8580 Other specified disorders of bone density and structure, unspecified site: Secondary | ICD-10-CM

## 2019-05-02 DIAGNOSIS — R739 Hyperglycemia, unspecified: Secondary | ICD-10-CM

## 2019-05-02 DIAGNOSIS — M51379 Other intervertebral disc degeneration, lumbosacral region without mention of lumbar back pain or lower extremity pain: Secondary | ICD-10-CM

## 2019-05-02 DIAGNOSIS — I700 Atherosclerosis of aorta: Secondary | ICD-10-CM

## 2019-05-02 DIAGNOSIS — M5137 Other intervertebral disc degeneration, lumbosacral region: Secondary | ICD-10-CM

## 2019-05-02 DIAGNOSIS — I129 Hypertensive chronic kidney disease with stage 1 through stage 4 chronic kidney disease, or unspecified chronic kidney disease: Secondary | ICD-10-CM | POA: Diagnosis not present

## 2019-05-02 DIAGNOSIS — G4733 Obstructive sleep apnea (adult) (pediatric): Secondary | ICD-10-CM | POA: Diagnosis not present

## 2019-05-02 DIAGNOSIS — D649 Anemia, unspecified: Secondary | ICD-10-CM

## 2019-05-02 DIAGNOSIS — Z0000 Encounter for general adult medical examination without abnormal findings: Secondary | ICD-10-CM

## 2019-05-02 DIAGNOSIS — E785 Hyperlipidemia, unspecified: Secondary | ICD-10-CM

## 2019-05-02 DIAGNOSIS — N183 Chronic kidney disease, stage 3 unspecified: Secondary | ICD-10-CM

## 2019-05-02 DIAGNOSIS — I10 Essential (primary) hypertension: Secondary | ICD-10-CM | POA: Diagnosis not present

## 2019-05-02 DIAGNOSIS — Z9989 Dependence on other enabling machines and devices: Secondary | ICD-10-CM

## 2019-05-02 DIAGNOSIS — M1711 Unilateral primary osteoarthritis, right knee: Secondary | ICD-10-CM | POA: Diagnosis not present

## 2019-05-02 DIAGNOSIS — K219 Gastro-esophageal reflux disease without esophagitis: Secondary | ICD-10-CM | POA: Diagnosis not present

## 2019-05-02 DIAGNOSIS — I25118 Atherosclerotic heart disease of native coronary artery with other forms of angina pectoris: Secondary | ICD-10-CM | POA: Diagnosis not present

## 2019-05-02 NOTE — Progress Notes (Signed)
Subjective:   Jodi Kirby is a 73 y.o. female who presents for Medicare Annual (Subsequent) preventive examination.  Review of Systems:   Cardiac Risk Factors include: advanced age (>41men, >77 women);hypertension;dyslipidemia;obesity (BMI >30kg/m2)     Objective:     Vitals: BP 130/80 (BP Location: Left Arm, Patient Position: Sitting, Cuff Size: Normal)   Pulse (!) 59   Temp 98.4 F (36.9 C) (Oral)   Resp 16   Ht 5\' 3"  (1.6 m)   Wt 184 lb 12.8 oz (83.8 kg)   SpO2 99%   BMI 32.74 kg/m   Body mass index is 32.74 kg/m.  Advanced Directives 05/02/2019 03/09/2018 02/09/2018 12/21/2017 05/17/2017 01/25/2017 11/16/2016  Does Patient Have a Medical Advance Directive? No No No No No No No  Would patient like information on creating a medical advance directive? Yes (MAU/Ambulatory/Procedural Areas - Information given) No - Patient declined No - Patient declined Yes (MAU/Ambulatory/Procedural Areas - Information given) - - -    Tobacco Social History   Tobacco Use  Smoking Status Never Smoker  Smokeless Tobacco Never Used  Tobacco Comment   smoking cessation materials not required     Counseling given: Not Answered Comment: smoking cessation materials not required   Clinical Intake:  Pre-visit preparation completed: Yes  Pain : No/denies pain     BMI - recorded: 32.74 Nutritional Status: BMI > 30  Obese Nutritional Risks: None Diabetes: No  How often do you need to have someone help you when you read instructions, pamphlets, or other written materials from your doctor or pharmacy?: 1 - Never  Interpreter Needed?: No  Information entered by :: Clemetine Marker LPN  Past Medical History:  Diagnosis Date  . Allergic rhinitis, cause unspecified   . Allergy   . Coronary artery disease   . Dysmetabolic syndrome X   . Dysuria   . Esophageal reflux   . Essential hypertension, benign   . HNP (herniated nucleus pulposus), lumbar    Dr. Sharlet [facility])  . Hyperlipidemia    . Lumbago   . Lumbar radiculitis    Dr. Sharlet [facility])  . Lump or mass in breast   . Myalgia and myositis, unspecified   . Obstructive sleep apnea    CPAP  . Osteoarthrosis, unspecified whether generalized or localized, lower leg   . Other abnormal glucose   . Other and unspecified angina pectoris   . 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 Toxic effect of venom(989.5)   . Unspecified iridocyclitis   . Unspecified vitamin D deficiency   . Vertigo 2018   1 episode   Past Surgical History:  Procedure Laterality Date  . ABDOMINAL HYSTERECTOMY  1970  . ANKLE SURGERY Right 1980  . BLADDER SURGERY  2012  . CARDIAC CATHETERIZATION  2003   [place]  . CARDIAC CATHETERIZATION     [place]  . CATARACT EXTRACTION W/PHACO Right 02/09/2018   Procedure: CATARACT EXTRACTION PHACO AND INTRAOCULAR LENS PLACEMENT ([place]) RIGHT;  Surgeon: Leandrew Koyanagi, MD;  Location: [place];  Service: Ophthalmology;  Laterality: Right;  sleep apnea  . CATARACT EXTRACTION W/PHACO Left 03/09/2018   Procedure: CATARACT EXTRACTION PHACO AND INTRAOCULAR LENS PLACEMENT ([facility]);  Surgeon: Leandrew Koyanagi, MD;  Location: [place];  Service: Ophthalmology;  Laterality: Left;  IVA TOPICAL LEFT  . COLONOSCOPY  2008, 2015   Dr. Allen Norris  . CYSTOSTOMY    . KNEE SURGERY  08/18/2011   arthroscopic right  .  LUMBAR FUSION     L4-5, S-1  . NECK SURGERY  2003  . TOTAL KNEE ARTHROPLASTY Right 04/29/2015   Procedure: TOTAL KNEE ARTHROPLASTY;  Surgeon: Erin SonsHarold Kernodle, MD;  Location: [facility];  Service: Orthopedics;  Laterality: Right;  . TUBAL LIGATION     Family History  Problem Relation Age of Onset  . Kidney disease Mother   . Hypertension Mother   . CAD Father   . Healthy Sister   . Kidney cancer Neg Hx   . Bladder Cancer Neg Hx    Social History   Socioeconomic History  . Marital status: Married    Spouse name: Morris  . Number of  children: 4  . Years of education: Not on file  . Highest education level: 11th grade  Occupational History  . Occupation: Retired  Engineer, productionocial Needs  . Financial resource strain: Not hard at all  . Food insecurity    Worry: Never true    Inability: Never true  . Transportation needs    Medical: No    Non-medical: No  Tobacco Use  . Smoking status: Never Smoker  . Smokeless tobacco: Never Used  . Tobacco comment: smoking cessation materials not required  Substance and Sexual Activity  . Alcohol use: No    Alcohol/week: 0.0 standard drinks  . Drug use: No  . Sexual activity: Not Currently    Partners: Male    Birth control/protection: None  Lifestyle  . Physical activity    Days per week: 0 days    Minutes per session: 0 min  . Stress: Not at all  Relationships  . Social connections    Talks on phone: More than three times a week    Gets together: More than three times a week    Attends religious service: More than 4 times per year    Active member of club or organization: Yes    Attends meetings of clubs or organizations: More than 4 times per year    Relationship status: Married  Other Topics Concern  . Not on file  Social History Narrative  . Not on file    Outpatient Encounter Medications as of 05/02/2019  Medication Sig  . acetaminophen (TYLENOL) 650 MG CR tablet Take 1 tablet daily as needed by mouth.  Marland Kitchen. alendronate (FOSAMAX) 70 MG tablet TAKE 1 TABLET WEEKLY  . amLODIPine-Valsartan-HCTZ 10-160-12.5 MG TABS Take 1 tablet by mouth daily.  Marland Kitchen. aspirin EC 81 MG tablet Take 81 mg by mouth every morning.  . Cholecalciferol (VITAMIN D3) 1000 UNITS CAPS Take 1,000 Units by mouth every morning.   Marland Kitchen. EPINEPHrine (EPIPEN 2-PAK) 0.3 mg/0.3 mL DEVI Inject 0.3 mg into the muscle as needed.   . ezetimibe (ZETIA) 10 MG tablet Take 1 tablet (10 mg total) by mouth daily.  . fluticasone (FLONASE) 50 MCG/ACT nasal spray USE TWO SPRAY(S) IN EACH NOSTRIL AT BEDTIME  . Multiple  Vitamins-Minerals (CENTRUM SILVER PO) Take 1 tablet by mouth every morning.   . nitroGLYCERIN (NITROSTAT) 0.4 MG SL tablet Place 1 tablet (0.4 mg total) under the tongue every 5 (five) minutes as needed for chest pain.  Marland Kitchen. omeprazole (PRILOSEC) 40 MG capsule Take 1 capsule (40 mg total) by mouth daily.  . traZODone (DESYREL) 50 MG tablet Take 1 tablet (50 mg total) by mouth at bedtime as needed for sleep.  . [DISCONTINUED] loratadine (CLARITIN) 10 MG tablet Take 1 tablet (10 mg total) by mouth daily. (Patient taking differently: Take 10 mg by mouth daily  as needed for allergies. )  . [DISCONTINUED] meclizine (ANTIVERT) 25 MG tablet Take 25 mg by mouth 3 (three) times daily as needed for dizziness.   No facility-administered encounter medications on file as of 05/02/2019.     Activities of Daily Living In your present state of health, do you have any difficulty performing the following activities: 05/02/2019 12/22/2018  Hearing? N N  Comment declines hearing aids -  Vision? N Y  Comment wears glasses -  Difficulty concentrating or making decisions? N N  Walking or climbing stairs? N N  Dressing or bathing? N N  Doing errands, shopping? N N  Preparing Food and eating ? N -  Using the Toilet? N -  In the past six months, have you accidently leaked urine? N -  Do you have problems with loss of bowel control? N -  Managing your Medications? N -  Managing your Finances? N -  Housekeeping or managing your Housekeeping? N -  Some recent data might be hidden    Patient Care Team: Alba CorySowles, Krichna, MD as PCP - General Mariah MillingGollan, Tollie Pizzaimothy J, MD as Consulting Physician (Cardiology) Vanna ScotlandBrandon, Ashley, MD as Consulting Physician (Urology)    Assessment:   This is a routine wellness examination for Tisha.  Exercise Activities and Dietary recommendations Current Exercise Habits: The patient does not participate in regular exercise at present, Exercise limited by: None identified  Goals    . DIET -  INCREASE WATER INTAKE     Recommend to drink at least 6-8 8oz glasses of water per day.       Fall Risk Fall Risk  05/02/2019 12/22/2018 12/22/2018 09/20/2018 05/20/2018  Falls in the past year? 0 0 0 0 No  Number falls in past yr: 0 0 - - -  Injury with Fall? 0 0 - - -  Follow up Falls prevention discussed - - - -   FALL RISK PREVENTION PERTAINING TO THE HOME:  Any stairs in or around the home? Yes  If so, do they handrails? Yes   Home free of loose throw rugs in walkways, pet beds, electrical cords, etc? Yes  Adequate lighting in your home to reduce risk of falls? Yes   ASSISTIVE DEVICES UTILIZED TO PREVENT FALLS:  Life alert? No  Use of a cane, walker or w/c? No  Grab bars in the bathroom? Yes  Shower chair or bench in shower? Yes  Elevated toilet seat or a handicapped toilet? Yes   DME ORDERS:  DME order needed?  No   TIMED UP AND GO:  Was the test performed? Yes .  Length of time to ambulate 10 feet: 5 sec.   GAIT:  Appearance of gait: Gait stead-fast and without the use of an assistive device.   Education: Fall risk prevention has been discussed.  Intervention(s) required? No    Depression Screen PHQ 2/9 Scores 05/02/2019 09/20/2018 05/20/2018 12/21/2017  PHQ - 2 Score 0 0 0 0  PHQ- 9 Score 0 0 0 -     Cognitive Function     6CIT Screen 05/02/2019 12/21/2017  What Year? 0 points 0 points  What month? 0 points 0 points  What time? 0 points 0 points  Count back from 20 0 points 0 points  Months in reverse 2 points 2 points  Repeat phrase 2 points 6 points  Total Score 4 8    Immunization History  Administered Date(s) Administered  . Influenza, High Dose Seasonal PF 09/15/2016, 07/07/2017  . Influenza-Unspecified  08/17/2014  . Pneumococcal Conjugate-13 12/05/2014  . Pneumococcal Polysaccharide-23 08/16/2007, 02/02/2013  . Tdap 02/23/2008  . Zoster 04/04/2008    Qualifies for Shingles Vaccine? Yes  Zostavax completed 2009. Due for Shingrix.  Education has been provided regarding the importance of this vaccine. Pt has been advised to call insurance company to determine out of pocket expense. Advised may also receive vaccine at local pharmacy or Health Dept. Verbalized acceptance and understanding.  Tdap: Although this vaccine is not a covered service during a Wellness Exam, does the patient still wish to receive this vaccine today?  No .  Education has been provided regarding the importance of this vaccine. Advised may receive this vaccine at local pharmacy or Health Dept. Aware to provide a copy of the vaccination record if obtained from local pharmacy or Health Dept. Verbalized acceptance and understanding.  Flu Vaccine: Due for Flu vaccine. Does the patient want to receive this vaccine today?  No . Education has been provided regarding the importance of this vaccine but still declined. Advised may receive this vaccine at local pharmacy or Health Dept. Aware to provide a copy of the vaccination record if obtained from local pharmacy or Health Dept. Verbalized acceptance and understanding.  Pneumococcal Vaccine: Up to date   Screening Tests Health Maintenance  Topic Date Due  . TETANUS/TDAP  05/04/2019 (Originally 02/22/2018)  . Hepatitis C Screening  08/25/2029 (Originally 1945-12-11)  . INFLUENZA VACCINE  06/03/2019  . MAMMOGRAM  10/13/2019  . COLONOSCOPY  12/15/2023  . DEXA SCAN  Completed  . PNA vac Low Risk Adult  Completed    Cancer Screenings:  Colorectal Screening: Completed 12/14/13. Repeat every 10 years  Mammogram: Completed 10/12/18. Repeat every year.  Bone Density: Completed 06/08/17. Results reflect OSTEOPENIA. Repeat every 2 years. Pt currently taking fosamax.    Lung Cancer Screening: (Low Dose CT Chest recommended if Age 55-80 years, 30 pack-year currently smoking OR have quit w/in 15years.) does not qualify.    Additional Screening:  Hepatitis C Screening: does qualify; postponed.  Vision Screening:  Recommended annual ophthalmology exams for early detection of glaucoma and other disorders of the eye. Is the patient up to date with their annual eye exam?  Yes  Who is the provider or what is the name of the office in which the pt attends annual eye exams? [facility]  Dental Screening: Recommended annual dental exams for proper oral hygiene  Community Resource Referral:  CRR required this visit?  No        Plan:     I have personally reviewed and addressed the Medicare Annual Wellness questionnaire and have noted the following in the patient's chart:  A. Medical and social history B. Use of alcohol, tobacco or illicit drugs  C. Current medications and supplements D. Functional ability and status E.  Nutritional status F.  Physical activity G. Advance directives H. List of other physicians I.  Hospitalizations, surgeries, and ER visits in previous 12 months J.  Vitals K. Screenings such as hearing and vision if needed, cognitive and depression L. Referrals and appointments   In addition, I have reviewed and discussed with patient certain preventive protocols, quality metrics, and best practice recommendations. A written personalized care plan for preventive services as well as general preventive health recommendations were provided to patient.   Signed,  Reather LittlerKasey Chandra Feger, LPN Nurse Health Advisor   Nurse Notes: pt doing well. Husband recently had hip replacement surgery. Same day appt with Dr. Carlynn PurlSowles.

## 2019-05-02 NOTE — Progress Notes (Signed)
Name: Jodi Kirby   MRN: [id number]    DOB: 05-28-1946   Date:05/02/2019       Progress Note  Subjective  Chief Complaint  Chief Complaint  Patient presents with  . Follow-up    HPI  OSA: using CPAP every night, she keeps it on for about 7 hours per night.Shehas not been taking Trazodone for a while and we will remove it from her list. She states she wakes up feeling rested and not headaches.  DDD lumbar spine: [facility] lumbar spine , she had 3 rounds of steroid injections by Dr. Council Mechanichasniss. Pain is nagging on right lumbar spine, 2/10, and radiating to right groin area intermittently .History of spinal fusion and L3-4 .  She does not want surgery    Knee replacement surgery right: surgery was June 27 th, 2016, she is still doing her exercises at home  but unable to go to the gym since COVID-19 , she has been walking at least 5000 steps per day. Pain varies and is tolerable, average 3/10. Able to tolerate it   HTN with CKI :she is back on one pill of Exforge HCTZ, no recent episodes of chest pain or palpitation. She has good urine output and not pruritis  BP today is at goal. She states bp at home also controlled   Hyperlipidemia/aorta atherosclerosis: unable to tolerate statins, even in a low dose of 2.5 mg of Crestor, muscles aches were severe and she stopped medication. Dr. Epifanio LeschesGollanstarted her on Zetia 07/2018 and is tolerating it well.We will recheck labs today   GERD: under control with Omeprazole in am'sand symptoms have been well controlled.No heartburn, no regurgitation. She also eats GERD appropriate diet . Unchanged   Angina Pectoris: she has CAD, mild disease, seeing Dr. Mariah MillingGollan. Taking aspirin and ARB, not on beta-blocker because of bradycardia and not on statin because of intolerance, but is now on Zetia and last LDL had a mild improvement and we will recheck today   Osteopenia: diagnosed in 2016 - and was started on Fosamax,she is  doing well, last bone density in 2018, no side effects of medication. -2.4 spine -2.2 of femur , she is due for repeat bone density but would like to hold off for now   Hyperglycemia: she denies polyphagia, polydipsia but has nocturia which is stable, at most twice per night.Last hgbA1C 5.5%, we will recheck it today   Mild cognitive dysfunction: CIT was 8 last year, she has been doing  cross work puzzles, word search and has been doing well, CIT today improved and is down to 4  Patient Active Problem List   Diagnosis Date Noted  . Benign hypertension with chronic kidney disease, stage III (HCC) 12/22/2018  . History of lumbar fusion 07/19/2018  . DDD (degenerative disc disease), lumbosacral 07/19/2018  . Obesity (BMI 30.0-34.9) 07/19/2018  . DOE (dyspnea on exertion) 07/11/2018  . Mild cognitive impairment 12/21/2017  . Aortic atherosclerosis (HCC) 05/15/2017  . Hyperglycemia 11/17/2016  . Chest wall pain 06/27/2016  . Osteopenia 05/15/2016  . Impingement syndrome of left shoulder 05/12/2016  . Insomnia 11/08/2015  . Angina pectoris (HCC) 07/10/2015  . Seasonal allergic rhinitis 07/09/2015  . GERD (gastroesophageal reflux disease) 07/09/2015  . Bee sting allergy 07/09/2015  . History of knee replacement procedure of right knee 04/29/2015  . History of colonic polyps 09/24/2014  . Fibrocystic breast 09/24/2014  . Primary osteoarthritis of right knee 06/12/2014  . Hyperlipidemia 02/09/2013  . Atherosclerosis of  native coronary artery of native heart with stable angina pectoris (HCC) 02/09/2013  . Obstructive sleep apnea on CPAP 02/09/2013  . Hypertension 02/09/2013    Past Surgical History:  Procedure Laterality Date  . ABDOMINAL HYSTERECTOMY  1970  . ANKLE SURGERY Right 1980  . BLADDER SURGERY  2012  . CARDIAC CATHETERIZATION  2003   [place]  . CARDIAC CATHETERIZATION     [place]  . CATARACT EXTRACTION W/PHACO Right 02/09/2018   Procedure: CATARACT EXTRACTION PHACO  AND INTRAOCULAR LENS PLACEMENT (IOC) RIGHT;  Surgeon: Lockie MolaBrasington, Chadwick, MD;  [facility];  Service: Ophthalmology;  Laterality: Right;  sleep apnea  . CATARACT EXTRACTION W/PHACO Left 03/09/2018   Procedure: CATARACT EXTRACTION PHACO AND INTRAOCULAR LENS PLACEMENT (IOC);  Surgeon: Lockie MolaBrasington, Chadwick, MD;  [facility];  Service: Ophthalmology;  Laterality: Left;  IVA TOPICAL LEFT  . COLONOSCOPY  2008, 2015   Dr. Servando SnareWohl  . CYSTOSTOMY    . KNEE SURGERY  08/18/2011   arthroscopic right  . LUMBAR FUSION     L4-5, S-1  . NECK SURGERY  2003  . TOTAL KNEE ARTHROPLASTY Right 04/29/2015   Procedure: TOTAL KNEE ARTHROPLASTY;  Surgeon: Erin SonsHarold [facility]ernodle, MD;  Location: [facility];  Service: Orthopedics;  Laterality: Right;  . TUBAL LIGATION      Family History  Problem Relation Age of Onset  . Kidney disease Mother   . Hypertension Mother   . CAD Father   . Healthy Sister   . Kidney cancer Neg Hx   . Bladder Cancer Neg Hx     Social History   Socioeconomic History  . Marital status: Married    Spouse name: Morris  . Number of children: 4  . Years of education: Not on file  . Highest education level: 11th grade  Occupational History  . Occupation: Retired  Engineer, productionocial Needs  . Financial resource strain: Not hard at all  . Food insecurity    Worry: Never true    Inability: Never true  . Transportation needs    Medical: No    Non-medical: No  Tobacco Use  . Smoking status: Never Smoker  . Smokeless tobacco: Never Used  . Tobacco comment: smoking cessation materials not required  Substance and Sexual Activity  . Alcohol use: No    Alcohol/week: 0.0 standard drinks  . Drug use: No  . Sexual activity: Not Currently    Partners: Male    Birth control/protection: None  Lifestyle  . Physical activity    Days per week: 0 days    Minutes per session: 0 min  . Stress: Not at all  Relationships  . Social connections    Talks on phone: More than three  times a week    Gets together: More than three times a week    Attends religious service: More than 4 times per year    Active member of club or organization: Yes    Attends meetings of clubs or organizations: More than 4 times per year    Relationship status: Married  . Intimate partner violence    Fear of current or ex partner: No    Emotionally abused: No    Physically abused: No    Forced sexual activity: No  Other Topics Concern  . Not on file  Social History Narrative  . Not on file     Current Outpatient Medications:  .  acetaminophen (TYLENOL) 650 MG CR tablet, Take 1 tablet daily as needed by mouth., Disp: ,  Rfl:  .  alendronate (FOSAMAX) 70 MG tablet, TAKE 1 TABLET WEEKLY, Disp: 12 tablet, Rfl: 0 .  amLODIPine-Valsartan-HCTZ 10-160-12.5 MG TABS, Take 1 tablet by mouth daily., Disp: 30 tablet, Rfl: 3 .  aspirin EC 81 MG tablet, Take 81 mg by mouth every morning., Disp: , Rfl:  .  Cholecalciferol (VITAMIN D3) 1000 UNITS CAPS, Take 1,000 Units by mouth every morning. , Disp: , Rfl:  .  EPINEPHrine (EPIPEN 2-PAK) 0.3 mg/0.3 mL DEVI, Inject 0.3 mg into the muscle as needed. , Disp: , Rfl:  .  ezetimibe (ZETIA) 10 MG tablet, Take 1 tablet (10 mg total) by mouth daily., Disp: 90 tablet, Rfl: 3 .  fluticasone (FLONASE) 50 MCG/ACT nasal spray, USE TWO SPRAY(S) IN EACH NOSTRIL AT BEDTIME, Disp: 48 g, Rfl: 1 .  Multiple Vitamins-Minerals (CENTRUM SILVER PO), Take 1 tablet by mouth every morning. , Disp: , Rfl:  .  nitroGLYCERIN (NITROSTAT) 0.4 MG SL tablet, Place 1 tablet (0.4 mg total) under the tongue every 5 (five) minutes as needed for chest pain., Disp: 25 tablet, Rfl: 0 .  omeprazole (PRILOSEC) 40 MG capsule, Take 1 capsule (40 mg total) by mouth daily., Disp: 90 capsule, Rfl: 3 .  traZODone (DESYREL) 50 MG tablet, Take 1 tablet (50 mg total) by mouth at bedtime as needed for sleep., Disp: 90 tablet, Rfl: 0  Allergies  Allergen Reactions  . Niaspan [Niacin Er] Other (See  Comments)    Patient states she can't remember the reaction because it so long ago.  . Oxycodone-Acetaminophen Nausea Only  . Statins Other (See Comments)    Muscle and joint pain.    I personally reviewed active problem list, medication list, allergies, family history, social history, health maintenance with the patient/caregiver today.   ROS  Constitutional: Negative for fever or weight change.  Respiratory: Negative for cough and shortness of breath.   Cardiovascular: Negative for chest pain or palpitations.  Gastrointestinal: Negative for abdominal pain, no bowel changes.  Musculoskeletal: Negative for gait problem or joint swelling.  Skin: Negative for rash.  Neurological: Negative for dizziness or headache.  No other specific complaints in a complete review of systems (except as listed in HPI above).   Objective  Vitals:   05/02/19 0853  BP: 130/80  Pulse: (!) 59  Resp: 16  Temp: 98.4 F (36.9 C)  TempSrc: Tympanic  SpO2: 99%  Weight: 184 lb 12.8 oz (83.8 kg)  Height: 5\' 3"  (1.6 m)    Body mass index is 32.74 kg/m.  Physical Exam  Constitutional: Patient appears well-developed and well-nourished. Obese  No distress.  HEENT: head atraumatic, normocephalic, pupils equal and reactive to light,  neck supple, oral mucosa not done  Cardiovascular: Normal rate, regular rhythm and normal heart sounds.  No murmur heard. No BLE edema. Pulmonary/Chest: Effort normal and breath sounds normal. No respiratory distress. Abdominal: Soft.  There is no tenderness. Psychiatric: Patient has a normal mood and affect. behavior is normal. Judgment and thought content normal.  PHQ2/9: [facility] 05/02/2019 09/20/2018 05/20/2018 12/21/2017 05/17/2017  Decreased Interest 0 0 0 0 0  Down, Depressed, Hopeless 0 0 0 0 0  PHQ - 2 Score 0 0 0 0 0  Altered sleeping 0 0 0 - -  Tired, decreased energy 0 0 0 - -  Change in appetite 0 0 0 - -  Feeling bad or failure about  yourself  0 0 0 - -  Trouble concentrating 0 0 0 - -  Moving slowly or fidgety/restless 0 0 0 - -  Suicidal thoughts 0 0 0 - -  PHQ-9 Score 0 0 0 - -  Difficult doing work/chores Not difficult at all Not difficult at all - - -    phq 9 is negative   Fall Risk: Fall Risk  05/02/2019 12/22/2018 12/22/2018 09/20/2018 05/20/2018  Falls in the past year? 0 0 0 0 No  Number falls in past yr: 0 0 - - -  Injury with Fall? 0 0 - - -  Follow up Falls prevention discussed - - - -    Assessment & Plan  1. Atherosclerosis of native coronary artery of native heart with stable angina pectoris ([place])  On Zetia, recheck level   2. Gastroesophageal reflux disease without esophagitis  Controlled   3. Essential hypertension   4. Obstructive sleep apnea on CPAP  Good compliance   5. Aortic atherosclerosis (HCC)  - Lipid panel  6. Benign hypertension with chronic kidney disease, stage III ([place])  At goal, continue medications  7. Osteopenia, unspecified location  - VITAMIN D 25 Hydroxy (Vit-D Deficiency, Fractures)  8. DDD (degenerative disc disease), lumbosacral  Doing better  9. Primary osteoarthritis of right knee  stable  10. Chronic kidney disease, stage III (moderate) ([place])  Recheck labs   11. Anemia, unspecified type  - CBC with Differential/Platelet - Methylmalonic Acid - Iron, TIBC and Ferritin Panel  12. Hyperglycemia  - Hemoglobin A1c  13. Dyslipidemia  - COMPLETE METABOLIC PANEL WITH GFR

## 2019-05-02 NOTE — Patient Instructions (Signed)
[name] , Thank you for taking time to come for your Medicare Wellness Visit. I appreciate your ongoing commitment to your health goals. Please review the following plan we discussed and let me know if I can assist you in the future.   Screening recommendations/referrals: Colonoscopy: done 12/14/13. Repeat in 2025. Mammogram: done 10/12/18 Bone Density: done 06/08/17 Recommended yearly ophthalmology/optometry visit for glaucoma screening and checkup Recommended yearly dental visit for hygiene and checkup  Vaccinations: Influenza vaccine: postponed Pneumococcal vaccine: done 12/05/14 Tdap vaccine: due - please contact us if you get a cut or scrape Shingles vaccine: Shingrix discussed. Please contact your pharmacy for coverage information.   Advanced directives: Advance directive discussed with you today. I have provided a copy for you to complete at home and have notarized. Once this is complete please bring a copy in to our office so we can scan it into your chart.  Conditions/risks identified: recommend increasing physical activity to 150 minutes per week  Next appointment: Please follow up in one year for your Medicare Annual Wellness visit.     Preventive Care 70 Years and Older, Female Preventive care refers to lifestyle choices and visits with your health care provider that can promote health and wellness. What does preventive care include?  A yearly physical exam. This is also called an annual well check.  Dental exams once or twice a year.  Routine eye exams. Ask your health care provider how often you should have your eyes checked.  Personal lifestyle choices, including:  Daily care of your teeth and gums.  Regular physical activity.  Eating a healthy diet.  Avoiding tobacco and drug use.  Limiting alcohol use.  Practicing safe sex.  Taking low-dose aspirin every day.  Taking vitamin and mineral supplements as recommended by your health care provider. What  happens during an annual well check? The services and screenings done by your health care provider during your annual well check will depend on your age, overall health, lifestyle risk factors, and family history of disease. Counseling  Your health care provider may ask you questions about your:  Alcohol use.  Tobacco use.  Drug use.  Emotional well-being.  Home and relationship well-being.  Sexual activity.  Eating habits.  History of falls.  Memory and ability to understand (cognition).  Work and work Statistician.  Reproductive health. Screening  You may have the following tests or measurements:  Height, weight, and BMI.  Blood pressure.  Lipid and cholesterol levels. These may be checked every 5 years, or more frequently if you are over 45 years old.  Skin check.  Lung cancer screening. You may have this screening every year starting at age 60 if you have a 30-pack-year history of smoking and currently smoke or have quit within the past 15 years.  Fecal occult blood test (FOBT) of the stool. You may have this test every year starting at age 41.  Flexible sigmoidoscopy or colonoscopy. You may have a sigmoidoscopy every 5 years or a colonoscopy every 10 years starting at age 24.  Hepatitis C blood test.  Hepatitis B blood test.  Sexually transmitted disease (STD) testing.  Diabetes screening. This is done by checking your blood sugar (glucose) after you have not eaten for a while (fasting). You may have this done every 1-3 years.  Bone density scan. This is done to screen for osteoporosis. You may have this done starting at age 64.  Mammogram. This may be done every 1-2 years. Talk to your health  care provider about how often you should have regular mammograms. Talk with your health care provider about your test results, treatment options, and if necessary, the need for more tests. Vaccines  Your health care provider may recommend certain vaccines, such as:   Influenza vaccine. This is recommended every year.  Tetanus, diphtheria, and acellular pertussis (Tdap, Td) vaccine. You may need a Td booster every 10 years.  Zoster vaccine. You may need this after age 19.  Pneumococcal 13-valent conjugate (PCV13) vaccine. One dose is recommended after age 83.  Pneumococcal polysaccharide (PPSV23) vaccine. One dose is recommended after age 14. Talk to your health care provider about which screenings and vaccines you need and how often you need them. This information is not intended to replace advice given to you by your health care provider. Make sure you discuss any questions you have with your health care provider. Document Released: 11/15/2015 Document Revised: 07/08/2016 Document Reviewed: 08/20/2015 Elsevier Interactive Patient Education  2017 [place] Prevention in the Home [place] can cause injuries. They can happen to people of all ages. There are many things you can do to make your home safe and to help prevent [place]. What can I do on the outside of my home?  Regularly fix the edges of walkways and driveways and fix any cracks.  Remove anything that might make you trip as you walk through a door, such as a raised step or threshold.  Trim any bushes or trees on the path to your home.  Use bright outdoor lighting.  Clear any walking paths of anything that might make someone trip, such as rocks or tools.  Regularly check to see if handrails are loose or broken. Make sure that both sides of any steps have handrails.  Any raised decks and porches should have guardrails on the edges.  Have any leaves, snow, or ice cleared regularly.  Use sand or salt on walking paths during winter.  Clean up any spills in your garage right away. This includes oil or grease spills. What can I do in the bathroom?  Use night lights.  Install grab bars by the toilet and in the tub and shower. Do not use towel bars as grab bars.  Use non-skid mats  or decals in the tub or shower.  If you need to sit down in the shower, use a plastic, non-slip stool.  Keep the floor dry. Clean up any water that spills on the floor as soon as it happens.  Remove soap buildup in the tub or shower regularly.  Attach bath mats securely with double-sided non-slip rug tape.  Do not have throw rugs and other things on the floor that can make you trip. What can I do in the bedroom?  Use night lights.  Make sure that you have a light by your bed that is easy to reach.  Do not use any sheets or blankets that are too big for your bed. They should not hang down onto the floor.  Have a firm chair that has side arms. You can use this for support while you get dressed.  Do not have throw rugs and other things on the floor that can make you trip. What can I do in the kitchen?  Clean up any spills right away.  Avoid walking on wet floors.  Keep items that you use a lot in easy-to-reach places.  If you need to reach something above you, use a strong step stool that has a grab  bar.  Keep electrical cords out of the way.  Do not use floor polish or wax that makes floors slippery. If you must use wax, use non-skid floor wax.  Do not have throw rugs and other things on the floor that can make you trip. What can I do with my stairs?  Do not leave any items on the stairs.  Make sure that there are handrails on both sides of the stairs and use them. Fix handrails that are broken or loose. Make sure that handrails are as long as the stairways.  Check any carpeting to make sure that it is firmly attached to the stairs. Fix any carpet that is loose or worn.  Avoid having throw rugs at the top or bottom of the stairs. If you do have throw rugs, attach them to the floor with carpet tape.  Make sure that you have a light switch at the top of the stairs and the bottom of the stairs. If you do not have them, ask someone to add them for you. What else can I do to  help prevent [place]?  Wear shoes that:  Do not have high heels.  Have rubber bottoms.  Are comfortable and fit you well.  Are closed at the toe. Do not wear sandals.  If you use a stepladder:  Make sure that it is fully opened. Do not climb a closed stepladder.  Make sure that both sides of the stepladder are locked into place.  Ask someone to hold it for you, if possible.  Clearly mark and make sure that you can see:  Any grab bars or handrails.  First and last steps.  Where the edge of each step is.  Use tools that help you move around (mobility aids) if they are needed. These include:  Canes.  Walkers.  Scooters.  Crutches.  Turn on the lights when you go into a dark area. Replace any light bulbs as soon as they burn out.  Set up your furniture so you have a clear path. Avoid moving your furniture around.  If any of your floors are uneven, fix them.  If there are any pets around you, be aware of where they are.  Review your medicines with your doctor. Some medicines can make you feel dizzy. This can increase your chance of falling. Ask your doctor what other things that you can do to help prevent [place]. This information is not intended to replace advice given to you by your health care provider. Make sure you discuss any questions you have with your health care provider. Document Released: 08/15/2009 Document Revised: 03/26/2016 Document Reviewed: 11/23/2014 Elsevier Interactive Patient Education  2017 [employer].

## 2019-05-06 LAB — COMPLETE METABOLIC PANEL WITH GFR
AG Ratio: 1.8 (calc) (ref 1.0–2.5)
ALT: 20 U/L (ref 6–29)
AST: 17 U/L (ref 10–35)
Albumin: 4.2 g/dL (ref 3.6–5.1)
Alkaline phosphatase (APISO): 86 U/L (ref 37–153)
BUN/Creatinine Ratio: 11 (calc) (ref 6–22)
BUN: 11 mg/dL (ref 7–25)
CO2: 28 mmol/L (ref 20–32)
Calcium: 9.7 mg/dL (ref 8.6–10.4)
Chloride: 105 mmol/L (ref 98–110)
Creat: 1.02 mg/dL — ABNORMAL HIGH (ref 0.60–0.93)
GFR, Est African American: 63 mL/min/{1.73_m2} (ref >=60)
GFR, Est Non African American: 55 mL/min/{1.73_m2} — ABNORMAL LOW (ref >=60)
Globulin: 2.3 g/dL (calc) (ref 1.9–3.7)
Glucose, Bld: 105 mg/dL — ABNORMAL HIGH (ref 65–99)
Potassium: 3.8 mmol/L (ref 3.5–5.3)
Sodium: 142 mmol/L (ref 135–146)
Total Bilirubin: 0.5 mg/dL (ref 0.2–1.2)
Total Protein: 6.5 g/dL (ref 6.1–8.1)

## 2019-05-06 LAB — IRON,TIBC AND FERRITIN PANEL
%SAT: 33 % (calc) (ref 16–45)
Ferritin: 182 ng/mL (ref 16–288)
Iron: 94 ug/dL (ref 45–160)
TIBC: 286 mcg/dL (calc) (ref 250–450)

## 2019-05-06 LAB — CBC WITH DIFFERENTIAL/PLATELET
Absolute Monocytes: 508 cells/uL (ref 200–950)
Basophils Absolute: 31 cells/uL (ref 0–200)
Basophils Relative: 0.4 %
Eosinophils Absolute: 23 cells/uL (ref 15–500)
Eosinophils Relative: 0.3 %
HCT: 33.8 % — ABNORMAL LOW (ref 35.0–45.0)
Hemoglobin: 11.2 g/dL — ABNORMAL LOW (ref 11.7–15.5)
Lymphs Abs: 1448 cells/uL (ref 850–3900)
MCH: 27.9 pg (ref 27.0–33.0)
MCHC: 33.1 g/dL (ref 32.0–36.0)
MCV: 84.3 fL (ref 80.0–100.0)
MPV: 10.8 fL (ref 7.5–12.5)
Monocytes Relative: 6.6 %
Neutro Abs: 5690 cells/uL (ref 1500–7800)
Neutrophils Relative %: 73.9 %
Platelets: 257 10*3/uL (ref 140–400)
RBC: 4.01 10*6/uL (ref 3.80–5.10)
RDW: 12.8 % (ref 11.0–15.0)
Total Lymphocyte: 18.8 %
WBC: 7.7 10*3/uL (ref 3.8–10.8)

## 2019-05-06 LAB — LIPID PANEL
Cholesterol: 180 mg/dL (ref <200)
HDL: 48 mg/dL — ABNORMAL LOW (ref >=50)
LDL Cholesterol (Calc): 102 mg/dL (calc) — ABNORMAL HIGH
Non-HDL Cholesterol (Calc): 132 mg/dL (calc) — ABNORMAL HIGH (ref <130)
Total CHOL/HDL Ratio: 3.8 (calc) (ref <5.0)
Triglycerides: 183 mg/dL — ABNORMAL HIGH (ref <150)

## 2019-05-06 LAB — VITAMIN D 25 HYDROXY (VIT D DEFICIENCY, FRACTURES): Vit D, 25-Hydroxy: 36 ng/mL (ref 30–100)

## 2019-05-06 LAB — HEMOGLOBIN A1C
Hgb A1c MFr Bld: 5.7 % of total Hgb — ABNORMAL HIGH (ref <5.7)
Mean Plasma Glucose: 117 (calc)
eAG (mmol/L): 6.5 (calc)

## 2019-05-06 LAB — METHYLMALONIC ACID, SERUM: Methylmalonic Acid, Quant: 165 nmol/L (ref 87–318)

## 2019-05-24 ENCOUNTER — Other Ambulatory Visit: Payer: Self-pay | Admitting: Family Medicine

## 2019-05-24 DIAGNOSIS — M8580 Other specified disorders of bone density and structure, unspecified site: Secondary | ICD-10-CM

## 2019-06-29 ENCOUNTER — Other Ambulatory Visit: Payer: Self-pay | Admitting: Cardiovascular Disease

## 2019-07-22 ENCOUNTER — Other Ambulatory Visit: Payer: Self-pay | Admitting: Cardiovascular Disease

## 2019-07-24 NOTE — Telephone Encounter (Signed)
Please schedule 12 mo F/U with Dr. Gollan. Thank you! 

## 2019-07-24 NOTE — Telephone Encounter (Signed)
Scheduled 9/30 with Rockey Situ

## 2019-08-01 NOTE — Progress Notes (Signed)
Virtual Visit via Video Note   This visit type was conducted due to national recommendations for restrictions regarding the COVID-19 Pandemic (e.g. social distancing) in an effort to limit this patient's exposure and mitigate transmission in our community.  Due to her co-morbid illnesses, this patient is at least at moderate risk for complications without adequate follow up.  This format is felt to be most appropriate for this patient at this time.  All issues noted in this document were discussed and addressed.  A limited physical exam was performed with this format.  Please refer to the patient's chart for her consent to telehealth for [facility].   I connected with  Cricket J Mayfield on 08/02/19 by a video enabled telemedicine application and verified that I am speaking with the correct person using two identifiers. I discussed the limitations of evaluation and management by telemedicine. The patient expressed understanding and agreed to proceed.   Evaluation Performed:  Follow-up visit  Date:  08/02/2019   ID:  Champagne, Paletta Mar 31, 1946, MRN [id number]  Patient Location:  [address] [facility] [place] [address]   Provider location:   [facility], [place] office  PCP:  Alba Cory, MD  Cardiologist:  Fonnie Mu   Chief Complaint:  stress   History of Present Illness:    Jodi Kirby is a 73 y.o. female who presents via audio/video conferencing for a telehealth visit today.   The patient does not symptoms concerning for COVID-19 infection (fever, chills, cough, or new SHORTNESS OF BREATH).   Patient has a past medical history of coronary  arterial disease, catheterization in 2003 with 20% disease in her RCA and LAD,  hyperlipidemia, back surgery,  obstructive sleep apnea on CPAP,  Total knee replacement  chest pressure.  She presents for routine followup of her CAD, hyperlipidemia  Stress at home Husband sick, Side effect from  methotrexate, Weight loss In hospital 3 times  Depressed No exercise Weight down 10 pounds, stress  Whole pill is amlodipine valsartan HCTZ 5/160/25  Again reports she is intolerant of statins even low-dose Crestor  tried several other statins. Each of these caused cramping   Other past medical history reviewed admitted to the hospital August 2017 for chest pain Cardiac enzymes negative,  She had outpatient Exercise stress Myoview test 07/29/2016 showing no ischemia, Ejection fraction 62%  Stress test in 2014 And August 2017 showing no ischemia   Prior CV studies:   The following studies were reviewed today:    Past Medical History:  Diagnosis Date  . Allergic rhinitis, cause unspecified   . Allergy   . Coronary artery disease   . Dysmetabolic syndrome X   . Dysuria   . Esophageal reflux   . Essential hypertension, benign   . HNP (herniated nucleus pulposus), lumbar    Dr. Yves Dill [facility])  . Hyperlipidemia   . Lumbago   . Lumbar radiculitis    Dr. Yves Dill [facility])  . Lump or mass in breast   . Myalgia and myositis, unspecified   . Obstructive sleep apnea    CPAP  . Osteoarthrosis, unspecified whether generalized or localized, lower leg   . Other abnormal glucose   . Other and unspecified angina pectoris   . 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 Toxic effect of venom(989.5)   . Unspecified iridocyclitis   . Unspecified vitamin D deficiency   . Vertigo 2018  1 episode   Past Surgical History:  Procedure Laterality Date  . ABDOMINAL HYSTERECTOMY  1970  . ANKLE SURGERY Right 1980  . BLADDER SURGERY  2012  . CARDIAC CATHETERIZATION  2003   [place]  . CARDIAC CATHETERIZATION     [place]  . CATARACT EXTRACTION W/PHACO Right 02/09/2018   Procedure: CATARACT EXTRACTION PHACO AND INTRAOCULAR LENS PLACEMENT (IOC) RIGHT;  Surgeon: Lockie MolaBrasington, Chadwick, MD;  [facility];  Service: Ophthalmology;   Laterality: Right;  sleep apnea  . CATARACT EXTRACTION W/PHACO Left 03/09/2018   Procedure: CATARACT EXTRACTION PHACO AND INTRAOCULAR LENS PLACEMENT (IOC);  Surgeon: Lockie MolaBrasington, Chadwick, MD;  [facility];  Service: Ophthalmology;  Laterality: Left;  IVA TOPICAL LEFT  . COLONOSCOPY  2008, 2015   Dr. Servando SnareWohl  . CYSTOSTOMY    . KNEE SURGERY  08/18/2011   arthroscopic right  . LUMBAR FUSION     L4-5, S-1  . NECK SURGERY  2003  . TOTAL KNEE ARTHROPLASTY Right 04/29/2015   Procedure: TOTAL KNEE ARTHROPLASTY;  Surgeon: Erin SonsHarold Kernodle, MD;  Location: [facility];  Service: Orthopedics;  Laterality: Right;  . TUBAL LIGATION        Allergies:   Niaspan [niacin er], Oxycodone-acetaminophen, and Statins   Social History   Tobacco Use  . Smoking status: Never Smoker  . Smokeless tobacco: Never Used  . Tobacco comment: smoking cessation materials not required  Substance Use Topics  . Alcohol use: No    Alcohol/week: 0.0 standard drinks  . Drug use: No     Current Outpatient Medications on File Prior to Visit  Medication Sig Dispense Refill  . acetaminophen (TYLENOL) 650 MG CR tablet Take 1 tablet daily as needed by mouth.    Marland Kitchen. alendronate (FOSAMAX) 70 MG tablet TAKE 1 TABLET WEEKLY 12 tablet 2  . amLODIPine-Valsartan-HCTZ 10-160-12.5 MG TABS Take 1 tablet by mouth once daily 90 tablet 0  . aspirin EC 81 MG tablet Take 81 mg by mouth every morning.    . Cholecalciferol (VITAMIN D3) 1000 UNITS CAPS Take 1,000 Units by mouth every morning.     Marland Kitchen. EPINEPHrine (EPIPEN 2-PAK) 0.3 mg/0.3 mL DEVI Inject 0.3 mg into the muscle as needed.     . ezetimibe (ZETIA) 10 MG tablet TAKE ONE TABLET BY MOUTH DAILY 90 tablet 0  . fluticasone (FLONASE) 50 MCG/ACT nasal spray USE TWO SPRAY(S) IN EACH NOSTRIL AT BEDTIME 48 g 1  . Multiple Vitamins-Minerals (CENTRUM SILVER PO) Take 1 tablet by mouth every morning.     . nitroGLYCERIN (NITROSTAT) 0.4 MG SL tablet Place 1 tablet (0.4 mg total) under the  tongue every 5 (five) minutes as needed for chest pain. 25 tablet 0  . omeprazole (PRILOSEC) 40 MG capsule Take 1 capsule (40 mg total) by mouth daily. 90 capsule 3  . traZODone (DESYREL) 50 MG tablet Take 1 tablet (50 mg total) by mouth at bedtime as needed for sleep. 90 tablet 0   No current facility-administered medications on file prior to visit.      Family Hx: The patient's family history includes CAD in her father; Healthy in her sister; Hypertension in her mother; Kidney disease in her mother. There is no history of Kidney cancer or Bladder Cancer.  ROS:   Please see the history of present illness.    Review of Systems  Constitutional: Negative.   HENT: Negative.   Respiratory: Negative.   Cardiovascular: Negative.   Gastrointestinal: Negative.   Musculoskeletal: Negative.   Neurological:  Negative.   Psychiatric/Behavioral: Negative.   All other systems reviewed and are negative.    Labs/Other Tests and Data Reviewed:    Recent Labs: 05/02/2019: ALT 20; BUN 11; Creat 1.02; Hemoglobin 11.2; Platelets 257; Potassium 3.8; Sodium 142   Recent Lipid Panel Lab Results  Component Value Date/Time   CHOL 180 05/02/2019 09:29 AM   CHOL 211 (H) 11/08/2015 11:01 AM   TRIG 183 (H) 05/02/2019 09:29 AM   HDL 48 (L) 05/02/2019 09:29 AM   HDL 48 11/08/2015 11:01 AM   CHOLHDL 3.8 05/02/2019 09:29 AM   LDLCALC 102 (H) 05/02/2019 09:29 AM    Wt Readings from Last 3 Encounters:  08/02/19 173 lb (78.5 kg)  05/02/19 184 lb 12.8 oz (83.8 kg)  05/02/19 184 lb 12.8 oz (83.8 kg)     Exam:    Vital Signs: Vital signs may also be detailed in the HPI BP (!) 150/77   Pulse 77   Ht 5\' 3"  (1.6 m)   Wt 173 lb (78.5 kg)   BMI 30.65 kg/m   Wt Readings from Last 3 Encounters:  08/02/19 173 lb (78.5 kg)  05/02/19 184 lb 12.8 oz (83.8 kg)  05/02/19 184 lb 12.8 oz (83.8 kg)   Temp Readings from Last 3 Encounters:  05/02/19 98.4 F (36.9 C) (Tympanic)  05/02/19 98.4 F (36.9 C) (Oral)   02/14/19 97.8 F (36.6 C) (Skin)   BP Readings from Last 3 Encounters:  08/02/19 (!) 150/77  05/02/19 130/80  05/02/19 130/80   Pulse Readings from Last 3 Encounters:  08/02/19 77  05/02/19 (!) 59  05/02/19 (!) 61     Well nourished, well developed female in no acute distress. Constitutional:  oriented to person, place, and time. No distress.    ASSESSMENT & PLAN:    Problem List Items Addressed This Visit      Cardiology Problems   Atherosclerosis of native coronary artery of native heart with stable angina pectoris (HCC) - Primary   Aortic atherosclerosis (HCC)   Hypertension   Hyperlipidemia     Other   DOE (dyspnea on exertion)     HTN: No changes to meds meds refilled Stressors at home that could contribute to labile numbers  Adjustment disorder Husband is sick, lost 35 pounds, she is having to spend most of her day taking care of him Daughter comes in for weekends, sons help Long discussion concerning caretaker burnout  Aortic atherosclerosis Statin intolerance, now tolerating Zetia with 30 point drop in total cholesterol With weight down 10 pounds, numbers should drop  Weight loss She attributes this to stress, not cooking very much as husband is not eating Discussed the benefits but also the risk Losing weight from stress is not ideal   COVID-19 Education: The signs and symptoms of COVID-19 were discussed with the patient and how to seek care for testing (follow up with PCP or arrange E-visit).  The importance of social distancing was discussed today.  Patient Risk:   After full review of this patients clinical status, I feel that they are at least moderate risk at this time.  Time:   Today, I have spent 25 minutes with the patient with telehealth technology discussing the cardiac and medical problems/diagnoses detailed above   Additional 10 min spent reviewing the chart prior to patient visit today   Medication Adjustments/Labs and Tests  Ordered: Current medicines are reviewed at length with the patient today.  Concerns regarding medicines are outlined above.   Tests  Ordered: No tests ordered   Medication Changes: No changes made   Disposition: Follow-up in 12 months   Signed, Julien Nordmann, MD  [facility] [address], [place], [place] [address]

## 2019-08-02 ENCOUNTER — Telehealth (INDEPENDENT_AMBULATORY_CARE_PROVIDER_SITE_OTHER): Payer: Medicare PPO | Admitting: Cardiovascular Disease

## 2019-08-02 VITALS — BP 150/77 | HR 77 | Ht 63.0 in | Wt 173.0 lb

## 2019-08-02 DIAGNOSIS — I700 Atherosclerosis of aorta: Secondary | ICD-10-CM | POA: Diagnosis not present

## 2019-08-02 DIAGNOSIS — E782 Mixed hyperlipidemia: Secondary | ICD-10-CM | POA: Diagnosis not present

## 2019-08-02 DIAGNOSIS — I10 Essential (primary) hypertension: Secondary | ICD-10-CM

## 2019-08-02 DIAGNOSIS — R0609 Other forms of dyspnea: Secondary | ICD-10-CM | POA: Diagnosis not present

## 2019-08-02 DIAGNOSIS — I25118 Atherosclerotic heart disease of native coronary artery with other forms of angina pectoris: Secondary | ICD-10-CM | POA: Diagnosis not present

## 2019-08-02 MED ORDER — EZETIMIBE 10 MG PO TABS: 10.0000 mg | ORAL_TABLET | Freq: Every day | ORAL | 3 refills | Status: DC

## 2019-08-02 MED ORDER — AMLODIPINE-VALSARTAN-HCTZ 10-160-12.5 MG PO TABS: ORAL_TABLET | ORAL | 3 refills | Status: DC

## 2019-08-02 NOTE — Patient Instructions (Addendum)
Medication Instructions:  Your physician recommends that you continue on your current medications as directed. Please refer to the Current Medication list given to you today.  Refills have been sent in for:  - Zetia: to [facility]  - Amlodipine-Valsartan-HCTZ: to [facility]  If you need a refill on your cardiac medications before your next appointment, please call your pharmacy.    Lab work: No new labs needed   If you have labs (blood work) drawn today and your tests are completely normal, you will receive your results only by: Marland Kitchen MyChart Message (if you have MyChart) OR . A paper copy in the mail If you have any lab test that is abnormal or we need to change your treatment, we will call you to review the results.   Testing/Procedures: No new testing needed   Follow-Up: At [facility], you and your health needs are our priority.  As part of our continuing mission to provide you with exceptional heart care, we have created designated Provider Care Teams.  These Care Teams include your primary Cardiologist (physician) and Advanced Practice Providers (APPs -  Physician Assistants and Nurse Practitioners) who all work together to provide you with the care you need, when you need it.  . You will need a follow up appointment in 12 months (late September/ early October) .   Please call our office 2 months in advance to schedule this appointment.  (Call in early July to try to schedule)  . Providers on your designated Care Team:   . Murray Hodgkins, NP . Christell Faith, PA-C . Marrianne Mood, PA-C  Any Other Special Instructions Will Be Listed Below (If Applicable).  For educational health videos Log in to : [email or web address] Or : [email or web address], password : [email or web address]

## 2019-09-02 DIAGNOSIS — G4733 Obstructive sleep apnea (adult) (pediatric): Secondary | ICD-10-CM | POA: Diagnosis not present

## 2019-10-16 ENCOUNTER — Other Ambulatory Visit: Payer: Self-pay

## 2019-10-16 ENCOUNTER — Encounter: Payer: Self-pay | Admitting: Family Medicine

## 2019-10-16 ENCOUNTER — Ambulatory Visit (INDEPENDENT_AMBULATORY_CARE_PROVIDER_SITE_OTHER): Payer: Medicare PPO | Admitting: Family Medicine

## 2019-10-16 VITALS — BP 130/60 | HR 87 | Temp 97.3°F | Resp 16 | Ht 63.0 in | Wt 170.0 lb

## 2019-10-16 DIAGNOSIS — J3089 Other allergic rhinitis: Secondary | ICD-10-CM

## 2019-10-16 DIAGNOSIS — I10 Essential (primary) hypertension: Secondary | ICD-10-CM

## 2019-10-16 DIAGNOSIS — K219 Gastro-esophageal reflux disease without esophagitis: Secondary | ICD-10-CM

## 2019-10-16 DIAGNOSIS — D638 Anemia in other chronic diseases classified elsewhere: Secondary | ICD-10-CM

## 2019-10-16 DIAGNOSIS — I129 Hypertensive chronic kidney disease with stage 1 through stage 4 chronic kidney disease, or unspecified chronic kidney disease: Secondary | ICD-10-CM

## 2019-10-16 DIAGNOSIS — Z9989 Dependence on other enabling machines and devices: Secondary | ICD-10-CM

## 2019-10-16 DIAGNOSIS — Z1231 Encounter for screening mammogram for malignant neoplasm of breast: Secondary | ICD-10-CM

## 2019-10-16 DIAGNOSIS — I25118 Atherosclerotic heart disease of native coronary artery with other forms of angina pectoris: Secondary | ICD-10-CM | POA: Diagnosis not present

## 2019-10-16 DIAGNOSIS — G4733 Obstructive sleep apnea (adult) (pediatric): Secondary | ICD-10-CM | POA: Diagnosis not present

## 2019-10-16 DIAGNOSIS — G3184 Mild cognitive impairment, so stated: Secondary | ICD-10-CM

## 2019-10-16 DIAGNOSIS — N182 Chronic kidney disease, stage 2 (mild): Secondary | ICD-10-CM

## 2019-10-16 DIAGNOSIS — G4700 Insomnia, unspecified: Secondary | ICD-10-CM | POA: Diagnosis not present

## 2019-10-16 DIAGNOSIS — E559 Vitamin D deficiency, unspecified: Secondary | ICD-10-CM

## 2019-10-16 DIAGNOSIS — J302 Other seasonal allergic rhinitis: Secondary | ICD-10-CM

## 2019-10-16 DIAGNOSIS — E785 Hyperlipidemia, unspecified: Secondary | ICD-10-CM | POA: Diagnosis not present

## 2019-10-16 DIAGNOSIS — I700 Atherosclerosis of aorta: Secondary | ICD-10-CM | POA: Diagnosis not present

## 2019-10-16 DIAGNOSIS — Z23 Encounter for immunization: Secondary | ICD-10-CM

## 2019-10-16 DIAGNOSIS — Z9289 Personal history of other medical treatment: Secondary | ICD-10-CM | POA: Diagnosis not present

## 2019-10-16 LAB — HM MAMMOGRAPHY: HM Mammogram: NORMAL (ref 0–4)

## 2019-10-16 MED ORDER — FLUTICASONE PROPIONATE 50 MCG/ACT NA SUSP: NASAL | 1 refills | Status: DC

## 2019-10-16 MED ORDER — TRAZODONE HCL 50 MG PO TABS: 50.0000 mg | ORAL_TABLET | Freq: Every evening | ORAL | 0 refills | Status: DC | PRN

## 2019-10-16 NOTE — Progress Notes (Signed)
Name: Jodi HellingRosely J Sparling   MRN: [id number]    DOB: 01-Apr-1946   Date:10/16/2019       Progress Note  Subjective  Chief Complaint  Chief Complaint  Patient presents with  . Hypertension  . Gastroesophageal Reflux  . Hyperlipidemia  . Hyperglycemia    HPI  OSA: using CPAP every night, she states not sleeping well since July when her husband lost 80 lbs and had multiple tests, unknown cause of weight loss.  She states she wakes up feeling rested and not headaches. She states helps with sleep   DDD lumbar spine: [facility] , she had 3 rounds of steroid injections by Dr. Council Mechanichasniss. Pain is nagging on right lumbar spine, 2/10, and radiating to right groin area intermittently .History of spinal fusion and L3-4 . She does not want surgery She has been carrying for her husband around the house, he lost 80 lbs in about one year , she states using proper technique and is not aggravating her back pain   Knee replacement surgery right: surgery was June 27 th, 2016, she is still doing her exercises at home  but unable to go to the gym since COVID-19 , she has been walking at least 5000 steps per day.Pain varies and is tolerable, average 3/10. Able to tolerate it   HTNwith CKI:she is back on one pill of Exforge HCTZ, no recent episodes of chest pain or palpitation. She has good urine output and not pruritisBP today is at goal. She also checks bp at home and is within normal limits.   Hyperlipidemia/aorta atherosclerosis: unable to tolerate statins, even in a low dose of 2.5 mg of Crestor, muscles aches were severe and she stopped medication. Dr. Epifanio LeschesGollanstarted her on Zetia 07/2018 and is tolerating it well.Last LDL showed improvement of LDL   GERD: under control with Omeprazole in am'sand symptoms have been well controlled.No heartburn, no regurgitation, no change in bowel movements   Angina Pectoris: she has CAD, mild disease, seeing Dr.  Mariah MillingGollan.Taking aspirin and ARB, not on beta-blocker because of bradycardia and not on statin because of intolerance, but is now on Zetiaand last LDL 102, down from 145. She states she is stressed but no change in chest pain   Osteopenia: diagnosed in 2016 - and was started on Fosamax,she is doing well, last bone density in 2018, no side effects of medication.-2.4 spine -2.2 of femur, she is due for repeat bone density but would like to hold off for now Unchanged   Hyperglycemia: she denies polyphagia, polydipsia but has nocturia which is stable, at most twice per night.Last hgbA1Cwent up from 5.5% to 5.7%   Mild cognitive dysfunction: CIT was 8 last year, she has been doing  cross work puzzles, word search and has been doing well, she is worried about her husband's health   Patient Active Problem List   Diagnosis Date Noted  . Benign hypertension with chronic kidney disease, stage III 12/22/2018  . History of lumbar fusion 07/19/2018  . DDD (degenerative disc disease), lumbosacral 07/19/2018  . Obesity (BMI 30.0-34.9) 07/19/2018  . DOE (dyspnea on exertion) 07/11/2018  . Mild cognitive impairment 12/21/2017  . Aortic atherosclerosis (HCC) 05/15/2017  . Hyperglycemia 11/17/2016  . Osteopenia 05/15/2016  . Impingement syndrome of left shoulder 05/12/2016  . Insomnia 11/08/2015  . Angina pectoris (HCC) 07/10/2015  . Seasonal allergic rhinitis 07/09/2015  . GERD (gastroesophageal reflux disease) 07/09/2015  . Bee sting allergy 07/09/2015  . History of  knee replacement procedure of right knee 04/29/2015  . History of colonic polyps 09/24/2014  . Fibrocystic breast 09/24/2014  . Primary osteoarthritis of right knee 06/12/2014  . Hyperlipidemia 02/09/2013  . Atherosclerosis of native coronary artery of native heart with stable angina pectoris ([place]) 02/09/2013  . Obstructive sleep apnea on CPAP 02/09/2013  . Hypertension 02/09/2013    Past Surgical History:  Procedure Laterality  Date  . ABDOMINAL HYSTERECTOMY  1970  . ANKLE SURGERY Right 1980  . BLADDER SURGERY  2012  . CARDIAC CATHETERIZATION  2003   [place]  . CARDIAC CATHETERIZATION     [place]  . CATARACT EXTRACTION W/PHACO Right 02/09/2018   Procedure: CATARACT EXTRACTION PHACO AND INTRAOCULAR LENS PLACEMENT ([place]) RIGHT;  Surgeon: Leandrew Koyanagi, MD;  Location: [facility];  Service: Ophthalmology;  Laterality: Right;  sleep apnea  . CATARACT EXTRACTION W/PHACO Left 03/09/2018   Procedure: CATARACT EXTRACTION PHACO AND INTRAOCULAR LENS PLACEMENT ([facility]);  Surgeon: Leandrew Koyanagi, MD;  Location: [facility];  Service: Ophthalmology;  Laterality: Left;  IVA TOPICAL LEFT  . COLONOSCOPY  2008, 2015   Dr. Allen Norris  . CYSTOSTOMY    . KNEE SURGERY  08/18/2011   arthroscopic right  . LUMBAR FUSION     L4-5, S-1  . NECK SURGERY  2003  . TOTAL KNEE ARTHROPLASTY Right 04/29/2015   Procedure: TOTAL KNEE ARTHROPLASTY;  Surgeon: Leanor Kail, MD;  Location: [facility];  Service: Orthopedics;  Laterality: Right;  . TUBAL LIGATION      Family History  Problem Relation Age of Onset  . Kidney disease Mother   . Hypertension Mother   . CAD Father   . Healthy Sister   . Kidney cancer Neg Hx   . Bladder Cancer Neg Hx     Social History   Socioeconomic History  . Marital status: Married    Spouse name: Morris  . Number of children: 4  . Years of education: Not on file  . Highest education level: 11th grade  Occupational History  . Occupation: Retired  Tobacco Use  . Smoking status: Never Smoker  . Smokeless tobacco: Never Used  . Tobacco comment: smoking cessation materials not required  Substance and Sexual Activity  . Alcohol use: No    Alcohol/week: 0.0 standard drinks  . Drug use: No  . Sexual activity: Not Currently    Partners: Male    Birth control/protection: None  Other Topics Concern  . Not on file  Social History Narrative  . Not on file   Social Determinants of  Health   Financial Resource Strain:   . Difficulty of Paying Living Expenses: Not on file  Food Insecurity:   . Worried About Charity fundraiser in the Last Year: Not on file  . Ran Out of Food in the Last Year: Not on file  Transportation Needs:   . Lack of Transportation (Medical): Not on file  . Lack of Transportation (Non-Medical): Not on file  Physical Activity: Inactive  . Days of Exercise per Week: 0 days  . Minutes of Exercise per Session: 0 min  Stress:   . Feeling of Stress : Not on file  Social Connections:   . Frequency of Communication with Friends and Family: Not on file  . Frequency of Social Gatherings with Friends and Family: Not on file  . Attends Religious Services: Not on file  . Active Member of Clubs or Organizations: Not on file  . Attends Archivist Meetings: Not on  file  . Marital Status: Not on file  Intimate Partner Violence:   . Fear of Current or Ex-Partner: Not on file  . Emotionally Abused: Not on file  . Physically Abused: Not on file  . Sexually Abused: Not on file     Current Outpatient Medications:  .  acetaminophen (TYLENOL) 650 MG CR tablet, Take 1 tablet daily as needed by mouth., Disp: , Rfl:  .  alendronate (FOSAMAX) 70 MG tablet, TAKE 1 TABLET WEEKLY, Disp: 12 tablet, Rfl: 2 .  amLODIPine-Valsartan-HCTZ 10-160-12.5 MG TABS, Take 1 tablet (10-160-12.5 mg) by mouth once daily, Disp: 90 tablet, Rfl: 3 .  aspirin EC 81 MG tablet, Take 81 mg by mouth every morning., Disp: , Rfl:  .  Cholecalciferol (VITAMIN D3) 1000 UNITS CAPS, Take 1,000 Units by mouth every morning. , Disp: , Rfl:  .  EPINEPHrine (EPIPEN 2-PAK) 0.3 mg/0.3 mL DEVI, Inject 0.3 mg into the muscle as needed. , Disp: , Rfl:  .  ezetimibe (ZETIA) 10 MG tablet, Take 1 tablet (10 mg total) by mouth daily., Disp: 90 tablet, Rfl: 3 .  fluticasone (FLONASE) 50 MCG/ACT nasal spray, USE TWO SPRAY(S) IN EACH NOSTRIL AT BEDTIME, Disp: 48 g, Rfl: 1 .  Multiple Vitamins-Minerals  (CENTRUM SILVER PO), Take 1 tablet by mouth every morning. , Disp: , Rfl:  .  nitroGLYCERIN (NITROSTAT) 0.4 MG SL tablet, Place 1 tablet (0.4 mg total) under the tongue every 5 (five) minutes as needed for chest pain., Disp: 25 tablet, Rfl: 0 .  omeprazole (PRILOSEC) 40 MG capsule, Take 1 capsule (40 mg total) by mouth daily., Disp: 90 capsule, Rfl: 3 .  traZODone (DESYREL) 50 MG tablet, Take 1 tablet (50 mg total) by mouth at bedtime as needed for sleep., Disp: 90 tablet, Rfl: 0  Allergies  Allergen Reactions  . Niaspan [Niacin Er] Other (See Comments)    Patient states she can't remember the reaction because it so long ago.  . Oxycodone-Acetaminophen Nausea Only  . Statins Other (See Comments)    Muscle and joint pain.    I personally reviewed active problem list, medication list, allergies, family history, social history, health maintenance, notes from last encounter with the patient/caregiver today.   ROS  Constitutional: Negative for fever or weight change.  Respiratory: Negative for cough and shortness of breath.   Cardiovascular: Negative for chest pain or palpitations.  Gastrointestinal: Negative for abdominal pain, no bowel changes.  Musculoskeletal: Negative for gait problem or joint swelling.  Skin: Negative for rash.  Neurological: Negative for dizziness or headache.  No other specific complaints in a complete review of systems (except as listed in HPI above).  Objective  Vitals:   10/16/19 0849 10/16/19 0851  BP: (!) 140/50 130/60  Pulse: 87   Resp: 16   Temp: (!) 97.3 F (36.3 C)   TempSrc: Temporal   SpO2: 100%   Weight: 170 lb (77.1 kg)   Height: 5\' 3"  (1.6 m)     Body mass index is 30.11 kg/m.  Physical Exam  Constitutional: Patient appears well-developed and well-nourished. Obese No distress.  HEENT: head atraumatic, normocephalic, pupils equal and reactive to light Cardiovascular: Normal rate, regular rhythm and normal heart sounds.  No murmur  heard. No BLE edema. Pulmonary/Chest: Effort normal and breath sounds normal. No respiratory distress. Abdominal: Soft.  There is no tenderness. Psychiatric: Patient has a normal mood and affect. behavior is normal. Judgment and thought content normal.  PHQ2/9: Depression screen [facility] 2/9 10/16/2019 05/02/2019  09/20/2018 05/20/2018 12/21/2017  Decreased Interest 0 0 0 0 0  Down, Depressed, Hopeless 0 0 0 0 0  PHQ - 2 Score 0 0 0 0 0  Altered sleeping 0 0 0 0 -  Tired, decreased energy 0 0 0 0 -  Change in appetite 0 0 0 0 -  Feeling bad or failure about yourself  0 0 0 0 -  Trouble concentrating 0 0 0 0 -  Moving slowly or fidgety/restless 0 0 0 0 -  Suicidal thoughts 0 0 0 0 -  PHQ-9 Score 0 0 0 0 -  Difficult doing work/chores - Not difficult at all Not difficult at all - -    phq 9 is negative   Fall Risk: Fall Risk  10/16/2019 05/02/2019 12/22/2018 12/22/2018 09/20/2018  Falls in the past year? 0 0 0 0 0  Number falls in past yr: 0 0 0 - -  Injury with Fall? 0 0 0 - -  Follow up - Falls prevention discussed - - -     Functional Status Survey: Is the patient deaf or have difficulty hearing?: No Does the patient have difficulty seeing, even when wearing glasses/contacts?: No Does the patient have difficulty concentrating, remembering, or making decisions?: No Does the patient have difficulty walking or climbing stairs?: No Does the patient have difficulty dressing or bathing?: No Does the patient have difficulty doing errands alone such as visiting a doctor's office or shopping?: No   Assessment & Plan  1. Atherosclerosis of native coronary artery of native heart with stable angina pectoris (HCC)  Continue medication   2. Insomnia, unspecified type  - traZODone (DESYREL) 50 MG tablet; Take 1 tablet (50 mg total) by mouth at bedtime as needed for sleep.  Dispense: 90 tablet; Refill: 0  3. Perennial allergic rhinitis with seasonal variation  - fluticasone (FLONASE) 50  MCG/ACT nasal spray; USE TWO SPRAY(S) IN EACH NOSTRIL AT BEDTIME  Dispense: 48 g; Refill: 1  4. Essential hypertension  At goal   5. Aortic atherosclerosis (HCC)  Continue statin, aspirin   6. Gastroesophageal reflux disease without esophagitis  Under control   7. Obstructive sleep apnea on CPAP  Compliant with CPAP   8. Dyslipidemia   9. Mild cognitive impairment  stable  10. Benign hypertension with chronic kidney disease, stage II  Continue medication , GFR improved  11. Anemia of chronic disease  stable  12. Vitamin D deficiency  Continue supplementation   13. Encounter for screening mammogram for breast cancer  Scheduled for today   14. Need for Tdap vaccination  Refused   15. Needs flu shot  Refused

## 2019-11-08 DIAGNOSIS — M25571 Pain in right ankle and joints of right foot: Secondary | ICD-10-CM | POA: Diagnosis not present

## 2019-11-08 DIAGNOSIS — E669 Obesity, unspecified: Secondary | ICD-10-CM | POA: Diagnosis not present

## 2019-11-20 DIAGNOSIS — G8929 Other chronic pain: Secondary | ICD-10-CM | POA: Diagnosis not present

## 2019-11-20 DIAGNOSIS — M5440 Lumbago with sciatica, unspecified side: Secondary | ICD-10-CM | POA: Diagnosis not present

## 2019-11-20 DIAGNOSIS — M545 Low back pain: Secondary | ICD-10-CM | POA: Diagnosis not present

## 2019-11-27 DIAGNOSIS — M5416 Radiculopathy, lumbar region: Secondary | ICD-10-CM | POA: Diagnosis not present

## 2019-11-27 DIAGNOSIS — M5126 Other intervertebral disc displacement, lumbar region: Secondary | ICD-10-CM | POA: Diagnosis not present

## 2019-11-27 DIAGNOSIS — M5136 Other intervertebral disc degeneration, lumbar region: Secondary | ICD-10-CM | POA: Diagnosis not present

## 2019-12-06 DIAGNOSIS — M5126 Other intervertebral disc displacement, lumbar region: Secondary | ICD-10-CM | POA: Diagnosis not present

## 2019-12-06 DIAGNOSIS — M5416 Radiculopathy, lumbar region: Secondary | ICD-10-CM | POA: Diagnosis not present

## 2020-01-09 DIAGNOSIS — M5416 Radiculopathy, lumbar region: Secondary | ICD-10-CM | POA: Diagnosis not present

## 2020-01-09 DIAGNOSIS — M5136 Other intervertebral disc degeneration, lumbar region: Secondary | ICD-10-CM | POA: Diagnosis not present

## 2020-01-30 DIAGNOSIS — M5136 Other intervertebral disc degeneration, lumbar region: Secondary | ICD-10-CM | POA: Diagnosis not present

## 2020-01-30 DIAGNOSIS — M5416 Radiculopathy, lumbar region: Secondary | ICD-10-CM | POA: Diagnosis not present

## 2020-01-30 DIAGNOSIS — M5126 Other intervertebral disc displacement, lumbar region: Secondary | ICD-10-CM | POA: Diagnosis not present

## 2020-02-02 DIAGNOSIS — G4733 Obstructive sleep apnea (adult) (pediatric): Secondary | ICD-10-CM | POA: Diagnosis not present

## 2020-02-06 ENCOUNTER — Other Ambulatory Visit: Payer: Self-pay | Admitting: Family Medicine

## 2020-02-06 DIAGNOSIS — K219 Gastro-esophageal reflux disease without esophagitis: Secondary | ICD-10-CM

## 2020-02-06 MED ORDER — OMEPRAZOLE 40 MG PO CPDR
40.0000 mg | DELAYED_RELEASE_CAPSULE | Freq: Every day | ORAL | 0 refills | Status: DC
Start: 2020-02-06 — End: 2020-04-15

## 2020-02-06 NOTE — Telephone Encounter (Signed)
Requested Prescriptions  Pending Prescriptions Disp Refills  . omeprazole (PRILOSEC) 40 MG capsule 90 capsule 3    Sig: Take 1 capsule (40 mg total) by mouth daily.     Gastroenterology: Proton Pump Inhibitors Passed - 02/06/2020 10:29 AM      Passed - Valid encounter within last 12 months    Recent Outpatient Visits          3 months ago Atherosclerosis of native coronary artery of native heart with stable angina pectoris [facility])   [facility] [place], Danna Hefty, MD   9 months ago Atherosclerosis of native coronary artery of native heart with stable angina pectoris [facility])   [facility] Alba Cory, MD   1 year ago Essential hypertension   [place] [facility] Alba Cory, MD   1 year ago Mixed hyperlipidemia   [facility] Alba Cory, MD   1 year ago Essential hypertension   [facility] Alba Cory, MD      Future Appointments            In 2 months Carlynn Purl, Danna Hefty, MD [facility]   In 2 months  [facility]

## 2020-02-06 NOTE — Telephone Encounter (Signed)
Medication Refill - Medication: omeprazole (PRILOSEC) 40 MG capsule    Preferred Pharmacy (with phone number or street name):  [facility] [place], [place] - [address] AT Portal to [employer]istered [employer] Phone:  [phone]  Fax:  [phone]       Agent: Please be advised that RX refills may take up to 3 business days. We ask that you follow-up with your pharmacy.

## 2020-02-16 ENCOUNTER — Other Ambulatory Visit: Payer: Self-pay | Admitting: Family Medicine

## 2020-02-16 DIAGNOSIS — M8580 Other specified disorders of bone density and structure, unspecified site: Secondary | ICD-10-CM

## 2020-02-27 ENCOUNTER — Telehealth: Payer: Self-pay | Admitting: Family Medicine

## 2020-02-27 NOTE — Telephone Encounter (Signed)
alendronate (FOSAMAX) 70 MG tablet  Pt called to report that she has yet to receive this delivery, please advise.    [facility] - [place], [place] - [address] AT Portal to Registered [employer] Sites  [address] [place] [address]  Phone: [phone] Fax: [phone]

## 2020-03-26 DIAGNOSIS — M5416 Radiculopathy, lumbar region: Secondary | ICD-10-CM | POA: Diagnosis not present

## 2020-03-26 DIAGNOSIS — M5136 Other intervertebral disc degeneration, lumbar region: Secondary | ICD-10-CM | POA: Diagnosis not present

## 2020-03-26 DIAGNOSIS — M5126 Other intervertebral disc displacement, lumbar region: Secondary | ICD-10-CM | POA: Diagnosis not present

## 2020-03-26 DIAGNOSIS — M1611 Unilateral primary osteoarthritis, right hip: Secondary | ICD-10-CM | POA: Diagnosis not present

## 2020-03-26 DIAGNOSIS — R1031 Right lower quadrant pain: Secondary | ICD-10-CM | POA: Diagnosis not present

## 2020-04-15 ENCOUNTER — Encounter: Payer: Self-pay | Admitting: Family Medicine

## 2020-04-15 ENCOUNTER — Other Ambulatory Visit: Payer: Self-pay

## 2020-04-15 ENCOUNTER — Ambulatory Visit (INDEPENDENT_AMBULATORY_CARE_PROVIDER_SITE_OTHER): Payer: Medicare PPO | Admitting: Family Medicine

## 2020-04-15 VITALS — BP 130/70 | HR 73 | Temp 97.3°F | Resp 16 | Ht 63.0 in | Wt 180.7 lb

## 2020-04-15 DIAGNOSIS — N182 Chronic kidney disease, stage 2 (mild): Secondary | ICD-10-CM

## 2020-04-15 DIAGNOSIS — M5136 Other intervertebral disc degeneration, lumbar region: Secondary | ICD-10-CM

## 2020-04-15 DIAGNOSIS — I700 Atherosclerosis of aorta: Secondary | ICD-10-CM | POA: Diagnosis not present

## 2020-04-15 DIAGNOSIS — G4700 Insomnia, unspecified: Secondary | ICD-10-CM

## 2020-04-15 DIAGNOSIS — G4733 Obstructive sleep apnea (adult) (pediatric): Secondary | ICD-10-CM

## 2020-04-15 DIAGNOSIS — I129 Hypertensive chronic kidney disease with stage 1 through stage 4 chronic kidney disease, or unspecified chronic kidney disease: Secondary | ICD-10-CM

## 2020-04-15 DIAGNOSIS — M791 Myalgia, unspecified site: Secondary | ICD-10-CM

## 2020-04-15 DIAGNOSIS — R739 Hyperglycemia, unspecified: Secondary | ICD-10-CM | POA: Diagnosis not present

## 2020-04-15 DIAGNOSIS — D638 Anemia in other chronic diseases classified elsewhere: Secondary | ICD-10-CM | POA: Diagnosis not present

## 2020-04-15 DIAGNOSIS — G3184 Mild cognitive impairment, so stated: Secondary | ICD-10-CM | POA: Diagnosis not present

## 2020-04-15 DIAGNOSIS — K219 Gastro-esophageal reflux disease without esophagitis: Secondary | ICD-10-CM | POA: Diagnosis not present

## 2020-04-15 DIAGNOSIS — M1711 Unilateral primary osteoarthritis, right knee: Secondary | ICD-10-CM

## 2020-04-15 DIAGNOSIS — E785 Hyperlipidemia, unspecified: Secondary | ICD-10-CM | POA: Diagnosis not present

## 2020-04-15 DIAGNOSIS — I25118 Atherosclerotic heart disease of native coronary artery with other forms of angina pectoris: Secondary | ICD-10-CM

## 2020-04-15 DIAGNOSIS — I209 Angina pectoris, unspecified: Secondary | ICD-10-CM

## 2020-04-15 DIAGNOSIS — E559 Vitamin D deficiency, unspecified: Secondary | ICD-10-CM | POA: Diagnosis not present

## 2020-04-15 DIAGNOSIS — T466X5A Adverse effect of antihyperlipidemic and antiarteriosclerotic drugs, initial encounter: Secondary | ICD-10-CM

## 2020-04-15 DIAGNOSIS — M51369 Other intervertebral disc degeneration, lumbar region without mention of lumbar back pain or lower extremity pain: Secondary | ICD-10-CM

## 2020-04-15 DIAGNOSIS — Z9989 Dependence on other enabling machines and devices: Secondary | ICD-10-CM

## 2020-04-15 MED ORDER — TRAZODONE HCL 50 MG PO TABS: 50.0000 mg | ORAL_TABLET | Freq: Every evening | ORAL | 0 refills | Status: DC | PRN

## 2020-04-15 MED ORDER — OMEPRAZOLE 20 MG PO CPDR: 20.0000 mg | DELAYED_RELEASE_CAPSULE | Freq: Every day | ORAL | 0 refills | Status: DC

## 2020-04-15 NOTE — Progress Notes (Signed)
Name: Jodi Kirby   MRN: [id number]    DOB: 1946-08-16   Date:04/15/2020       Progress Note  Subjective  Chief Complaint  Chief Complaint  Patient presents with  . Hypertension  . Hyperlipidemia  . Hyperglycemia  . Gastroesophageal Reflux    HPI  OSA: she usually wears her CPAP every night, however recently  diagnosed with a tooth abscess and has not been able to wear CPAP because the mask causes pain - she is currently seeing dentist and is taking Amoxicillin.She states she wakes up feeling rested and not headaches. She states helps with sleep   DDD lumbar spine: seen at [facility] had MRI lumbar spine, she had 3 rounds of steroid injections by Dr. Phyllis Ginger. Pain is nagging on right lumbar spine,can go up to 8/10, and radiating to right groin, and she is going back for an injection on right groin area- she states worse in the morning. History of spinal fusion and L3-4 .She does not want surgeryShe has been driving more taking her husband to [place] because of vision problems, at least he has gained his weight back - still unknown the cause of the 80 lbs weight loss he had last year   - he has RA  Knee replacement surgery right: surgery was June 27 th, 2016, she is still doing her exercises at home.Pain varies and is tolerable, average pain is now about 5/10.   HTNwith CKI:she is back on one pill of Exforge HCTZ, no recent episodes of chest pain or palpitation, denies orthopnea . She has good urine output and not pruritisBP was high when she first came in but improved once she rested . She also checks bp at home and it is usually 130's/70's.   Hyperlipidemia/aorta atherosclerosis: unable to tolerate statins, even in a low dose of 2.5 mg of Crestor, muscles aches were severe and she stopped medication. Dr. Earline Mayotte her on Zetia 07/2018 and is tolerating it well. She continues to have muscle aches and cramps on her hands even though not taking statin therapy.  She has been taking magnesium She is very good at drinking water, discussed adding Propel.   GERD: under control with Omeprazole in am'sand symptoms have been well controlled.No heartburn, no regurgitation, no change in bowel movements Unchanged   Angina Pectoris: she has CAD, mild disease, seeing Dr. Rockey Situ.Taking aspirin and ARB, not on beta-blocker because of bradycardia and not on statin because of intolerance, but is now on Zetiaand last LDL 102, down from 145. Still not at goal, but that is all she can handle at this time  Osteopenia: diagnosed in 2016 - and was started on Fosamax,she is doing well, last bone density in 2018, no side effects of medication.-2.4 spine -2.2 of femur, she is due for repeat bone density but would like to hold off for now, she will try to get it done at the same time as her next mammogram   Hyperglycemia: she denies polyphagia, polydipsia but has nocturia which is stable, at most twice per night.Last hgbA1Cwent up from 5.5% to 5.7% Recheck labs today   Mild cognitive dysfunction:CIT was 8 in 2020  she has been doingcross work puzzles, word search and has been doing well, she is states feeling better now   Atherosclerosis of Aorta: taking zetia and aspirin  Patient Active Problem List   Diagnosis Date Noted  . Benign hypertension with chronic kidney disease, stage III 12/22/2018  . History of lumbar fusion 07/19/2018  .  DDD (degenerative disc disease), lumbosacral 07/19/2018  . Obesity (BMI 30.0-34.9) 07/19/2018  . DOE (dyspnea on exertion) 07/11/2018  . Mild cognitive impairment 12/21/2017  . Aortic atherosclerosis (HCC) 05/15/2017  . Hyperglycemia 11/17/2016  . Osteopenia 05/15/2016  . Impingement syndrome of left shoulder 05/12/2016  . Insomnia 11/08/2015  . Angina pectoris (HCC) 07/10/2015  . Seasonal allergic rhinitis 07/09/2015  . GERD (gastroesophageal reflux disease) 07/09/2015  . Bee sting allergy 07/09/2015  . History of  knee replacement procedure of right knee 04/29/2015  . History of colonic polyps 09/24/2014  . Fibrocystic breast 09/24/2014  . Primary osteoarthritis of right knee 06/12/2014  . Hyperlipidemia 02/09/2013  . Atherosclerosis of native coronary artery of native heart with stable angina pectoris (HCC) 02/09/2013  . Obstructive sleep apnea on CPAP 02/09/2013  . Hypertension 02/09/2013    Past Surgical History:  Procedure Laterality Date  . ABDOMINAL HYSTERECTOMY  1970  . ANKLE SURGERY Right 1980  . BLADDER SURGERY  2012  . CARDIAC CATHETERIZATION  2003   [place]  . CARDIAC CATHETERIZATION     [place]  . CATARACT EXTRACTION W/PHACO Right 02/09/2018   Procedure: CATARACT EXTRACTION PHACO AND INTRAOCULAR LENS PLACEMENT (IOC) RIGHT;  Surgeon: Lockie Mola, MD;  Location: [facility];  Service: Ophthalmology;  Laterality: Right;  sleep apnea  . CATARACT EXTRACTION W/PHACO Left 03/09/2018   Procedure: CATARACT EXTRACTION PHACO AND INTRAOCULAR LENS PLACEMENT (IOC);  Surgeon: Lockie Mola, MD;  Location: [facility];  Service: Ophthalmology;  Laterality: Left;  IVA TOPICAL LEFT  . COLONOSCOPY  2008, 2015   Dr. Servando Snare  . CYSTOSTOMY    . KNEE SURGERY  08/18/2011   arthroscopic right  . LUMBAR FUSION     L4-5, S-1  . NECK SURGERY  2003  . TOTAL KNEE ARTHROPLASTY Right 04/29/2015   Procedure: TOTAL KNEE ARTHROPLASTY;  Surgeon: Erin Sons, MD;  Location: [facility];  Service: Orthopedics;  Laterality: Right;  . TUBAL LIGATION      Family History  Problem Relation Age of Onset  . Kidney disease Mother   . Hypertension Mother   . CAD Father   . Healthy Sister   . Kidney cancer Neg Hx   . Bladder Cancer Neg Hx     Social History   Tobacco Use  . Smoking status: Never Smoker  . Smokeless tobacco: Never Used  . Tobacco comment: smoking cessation materials not required  Substance Use Topics  . Alcohol use: No    Alcohol/week: 0.0 standard drinks      Current Outpatient Medications:  .  acetaminophen (TYLENOL) 650 MG CR tablet, Take 1 tablet daily as needed by mouth., Disp: , Rfl:  .  alendronate (FOSAMAX) 70 MG tablet, TAKE 1 TABLET WEEKLY, Disp: 12 tablet, Rfl: 2 .  amLODIPine-Valsartan-HCTZ 10-160-12.5 MG TABS, Take 1 tablet (10-160-12.5 mg) by mouth once daily, Disp: 90 tablet, Rfl: 3 .  aspirin EC 81 MG tablet, Take 81 mg by mouth every morning., Disp: , Rfl:  .  Cholecalciferol (VITAMIN D3) 1000 UNITS CAPS, Take 1,000 Units by mouth every morning. , Disp: , Rfl:  .  EPINEPHrine (EPIPEN 2-PAK) 0.3 mg/0.3 mL DEVI, Inject 0.3 mg into the muscle as needed. , Disp: , Rfl:  .  ezetimibe (ZETIA) 10 MG tablet, Take 1 tablet (10 mg total) by mouth daily., Disp: 90 tablet, Rfl: 3 .  fluticasone (FLONASE) 50 MCG/ACT nasal spray, USE TWO SPRAY(S) IN EACH NOSTRIL AT BEDTIME, Disp: 48 g, Rfl: 1 .  Multiple Vitamins-Minerals (  CENTRUM SILVER PO), Take 1 tablet by mouth every morning. , Disp: , Rfl:  .  nitroGLYCERIN (NITROSTAT) 0.4 MG SL tablet, Place 1 tablet (0.4 mg total) under the tongue every 5 (five) minutes as needed for chest pain., Disp: 25 tablet, Rfl: 0 .  omeprazole (PRILOSEC) 40 MG capsule, Take 1 capsule (40 mg total) by mouth daily., Disp: 90 capsule, Rfl: 0 .  traZODone (DESYREL) 50 MG tablet, Take 1 tablet (50 mg total) by mouth at bedtime as needed for sleep., Disp: 90 tablet, Rfl: 0  Allergies  Allergen Reactions  . Niaspan [Niacin Er] Other (See Comments)    Patient states she can't remember the reaction because it so long ago.  . Oxycodone-Acetaminophen Nausea Only  . Statins Other (See Comments)    Muscle and joint pain.    I personally reviewed active problem list, medication list, allergies, family history, social history, health maintenance with the patient/caregiver today.   ROS  Constitutional: Negative for fever or weight change.  Respiratory: Negative for cough and shortness of breath.   Cardiovascular:  Negative for chest pain or palpitations.  Gastrointestinal: Negative for abdominal pain, no bowel changes.  Musculoskeletal: Negative for gait problem or joint swelling.  Skin: Negative for rash.  Neurological: Negative for dizziness or headache.  No other specific complaints in a complete review of systems (except as listed in HPI above).  Objective  Vitals:   04/15/20 0818 04/15/20 0825  BP: (!) 160/78 130/70  Pulse: 73   Resp: 16   Temp: (!) 97.3 F (36.3 C)   TempSrc: Temporal   SpO2: 99%   Weight: 180 lb 11.2 oz (82 kg)   Height: 5\' 3"  (1.6 m)     Body mass index is 32.01 kg/m.  Physical Exam  Constitutional: Patient appears well-developed and well-nourished. Obese  No distress.  HEENT: head atraumatic, normocephalic, pupils equal and reactive to light, , neck supple, oral mucosa not examined   Cardiovascular: Normal rate, regular rhythm and normal heart sounds.  No murmur heard. No BLE edema. Pulmonary/Chest: Effort normal and breath sounds normal. No respiratory distress. Abdominal: Soft.  There is no tenderness. Psychiatric: Patient has a normal mood and affect. behavior is normal. Judgment and thought content normal.  PHQ2/9: Depression screen [facility] 2/9 04/15/2020 10/16/2019 05/02/2019 09/20/2018 05/20/2018  Decreased Interest 0 0 0 0 0  Down, Depressed, Hopeless 0 0 0 0 0  PHQ - 2 Score 0 0 0 0 0  Altered sleeping 0 0 0 0 0  Tired, decreased energy 0 0 0 0 0  Change in appetite 0 0 0 0 0  Feeling bad or failure about yourself  0 0 0 0 0  Trouble concentrating 0 0 0 0 0  Moving slowly or fidgety/restless 0 0 0 0 0  Suicidal thoughts 0 0 0 0 0  PHQ-9 Score 0 0 0 0 0  Difficult doing work/chores - - Not difficult at all Not difficult at all -    phq 9 is negative   Fall Risk: Fall Risk  04/15/2020 10/16/2019 05/02/2019 12/22/2018 12/22/2018  Falls in the past year? - 0 0 0 0  Number falls in past yr: 0 0 0 0 -  Injury with Fall? 0 0 0 0 -  Follow up - - Falls  prevention discussed - -     Functional Status Survey: Is the patient deaf or have difficulty hearing?: No Does the patient have difficulty seeing, even when wearing glasses/contacts?: No Does  the patient have difficulty concentrating, remembering, or making decisions?: No Does the patient have difficulty walking or climbing stairs?: No Does the patient have difficulty dressing or bathing?: No Does the patient have difficulty doing errands alone such as visiting a doctor's office or shopping?: No    Assessment & Plan  1. Benign hypertension with chronic kidney disease, stage II  - CBC with Differential/Platelet - COMPLETE METABOLIC PANEL WITH GFR  2. Obstructive sleep apnea on CPAP   3. Aortic atherosclerosis (HCC)  - Lipid panel  4. Gastroesophageal reflux disease without esophagitis  controlled  5. Dyslipidemia  - Lipid panel  6. Mild cognitive impairment   7. Anemia of chronic disease  Stable  8. Vitamin D deficiency  - VITAMIN D 25 Hydroxy (Vit-D Deficiency, Fractures)  9. Atherosclerosis of native coronary artery of native heart with stable angina pectoris (HCC)  On zetia , has NTG at home   10. Angina pectoris (HCC)   11. Primary osteoarthritis of right knee   12. DDD (degenerative disc disease), lumbar  Under the care of Dr. Council Mechanic   13. Hyperglycemia  - Hemoglobin A1c  14. Myalgia due to statin

## 2020-04-16 DIAGNOSIS — M1611 Unilateral primary osteoarthritis, right hip: Secondary | ICD-10-CM | POA: Diagnosis not present

## 2020-04-16 LAB — COMPLETE METABOLIC PANEL WITH GFR
AG Ratio: 1.8 (calc) (ref 1.0–2.5)
ALT: 21 U/L (ref 6–29)
AST: 17 U/L (ref 10–35)
Albumin: 4.4 g/dL (ref 3.6–5.1)
Alkaline phosphatase (APISO): 81 U/L (ref 37–153)
BUN/Creatinine Ratio: 14 (calc) (ref 6–22)
BUN: 15 mg/dL (ref 7–25)
CO2: 30 mmol/L (ref 20–32)
Calcium: 9.8 mg/dL (ref 8.6–10.4)
Chloride: 106 mmol/L (ref 98–110)
Creat: 1.11 mg/dL — ABNORMAL HIGH (ref 0.60–0.93)
GFR, Est African American: 57 mL/min/{1.73_m2} — ABNORMAL LOW (ref >=60)
GFR, Est Non African American: 49 mL/min/{1.73_m2} — ABNORMAL LOW (ref >=60)
Globulin: 2.4 g/dL (calc) (ref 1.9–3.7)
Glucose, Bld: 104 mg/dL — ABNORMAL HIGH (ref 65–99)
Potassium: 4 mmol/L (ref 3.5–5.3)
Sodium: 142 mmol/L (ref 135–146)
Total Bilirubin: 0.4 mg/dL (ref 0.2–1.2)
Total Protein: 6.8 g/dL (ref 6.1–8.1)

## 2020-04-16 LAB — HEMOGLOBIN A1C
Hgb A1c MFr Bld: 5.4 % of total Hgb (ref <5.7)
Mean Plasma Glucose: 108 (calc)
eAG (mmol/L): 6 (calc)

## 2020-04-16 LAB — LIPID PANEL
Cholesterol: 187 mg/dL (ref <200)
HDL: 53 mg/dL (ref >=50)
LDL Cholesterol (Calc): 107 mg/dL (calc) — ABNORMAL HIGH
Non-HDL Cholesterol (Calc): 134 mg/dL (calc) — ABNORMAL HIGH (ref <130)
Total CHOL/HDL Ratio: 3.5 (calc) (ref <5.0)
Triglycerides: 157 mg/dL — ABNORMAL HIGH (ref <150)

## 2020-04-16 LAB — CBC WITH DIFFERENTIAL/PLATELET
Absolute Monocytes: 429 cells/uL (ref 200–950)
Basophils Absolute: 27 cells/uL (ref 0–200)
Basophils Relative: 0.4 %
Eosinophils Absolute: 27 cells/uL (ref 15–500)
Eosinophils Relative: 0.4 %
HCT: 32.8 % — ABNORMAL LOW (ref 35.0–45.0)
Hemoglobin: 10.8 g/dL — ABNORMAL LOW (ref 11.7–15.5)
Lymphs Abs: 1829 cells/uL (ref 850–3900)
MCH: 28.2 pg (ref 27.0–33.0)
MCHC: 32.9 g/dL (ref 32.0–36.0)
MCV: 85.6 fL (ref 80.0–100.0)
MPV: 10.7 fL (ref 7.5–12.5)
Monocytes Relative: 6.4 %
Neutro Abs: 4389 cells/uL (ref 1500–7800)
Neutrophils Relative %: 65.5 %
Platelets: 260 10*3/uL (ref 140–400)
RBC: 3.83 10*6/uL (ref 3.80–5.10)
RDW: 12.7 % (ref 11.0–15.0)
Total Lymphocyte: 27.3 %
WBC: 6.7 10*3/uL (ref 3.8–10.8)

## 2020-04-16 LAB — VITAMIN D 25 HYDROXY (VIT D DEFICIENCY, FRACTURES): Vit D, 25-Hydroxy: 38 ng/mL (ref 30–100)

## 2020-05-02 ENCOUNTER — Other Ambulatory Visit: Payer: Self-pay

## 2020-05-02 ENCOUNTER — Ambulatory Visit (INDEPENDENT_AMBULATORY_CARE_PROVIDER_SITE_OTHER): Payer: Medicare PPO

## 2020-05-02 VITALS — BP 144/74 | HR 74 | Temp 97.1°F | Resp 16 | Ht 63.0 in | Wt 182.7 lb

## 2020-05-02 DIAGNOSIS — Z0000 Encounter for general adult medical examination without abnormal findings: Secondary | ICD-10-CM

## 2020-05-02 DIAGNOSIS — Z780 Asymptomatic menopausal state: Secondary | ICD-10-CM

## 2020-05-02 DIAGNOSIS — Z1231 Encounter for screening mammogram for malignant neoplasm of breast: Secondary | ICD-10-CM | POA: Diagnosis not present

## 2020-05-02 DIAGNOSIS — M8580 Other specified disorders of bone density and structure, unspecified site: Secondary | ICD-10-CM

## 2020-05-02 NOTE — Patient Instructions (Signed)
Jodi Kirby , Thank you for taking time to come for your Medicare Wellness Visit. I appreciate your ongoing commitment to your health goals. Please review the following plan we discussed and let me know if I can assist you in the future.   Screening recommendations/referrals: Colonoscopy: done 12/14/13 Mammogram: done 10/16/19. Please call [phone] to schedule your mammogram and bone density screening.  Bone Density: done 06/08/17 Recommended yearly ophthalmology/optometry visit for glaucoma screening and checkup Recommended yearly dental visit for hygiene and checkup  Vaccinations: Influenza vaccine: postponed Pneumococcal vaccine: done 12/05/14 Tdap vaccine: DUE Shingles vaccine: Shingrix discussed. Please contact your pharmacy for coverage information.  Covid-19: discussed  Advanced directives: Please bring a copy of your health care power of attorney and living will to the office at your convenience once you have completed those documents.   Conditions/risks identified: Recommend healthy eating and physical activity for desired weight loss  Next appointment: Follow up in one year for your annual wellness visit    Preventive Care 75 Years and Older, Female Preventive care refers to lifestyle choices and visits with your health care provider that can promote health and wellness. What does preventive care include?  A yearly physical exam. This is also called an annual well check.  Dental exams once or twice a year.  Routine eye exams. Ask your health care provider how often you should have your eyes checked.  Personal lifestyle choices, including:  Daily care of your teeth and gums.  Regular physical activity.  Eating a healthy diet.  Avoiding tobacco and drug use.  Limiting alcohol use.  Practicing safe sex.  Taking low-dose aspirin every day.  Taking vitamin and mineral supplements as recommended by your health care provider. What happens during an annual well  check? The services and screenings done by your health care provider during your annual well check will depend on your age, overall health, lifestyle risk factors, and family history of disease. Counseling  Your health care provider may ask you questions about your:  Alcohol use.  Tobacco use.  Drug use.  Emotional well-being.  Home and relationship well-being.  Sexual activity.  Eating habits.  History of falls.  Memory and ability to understand (cognition).  Work and work Astronomer.  Reproductive health. Screening  You may have the following tests or measurements:  Height, weight, and BMI.  Blood pressure.  Lipid and cholesterol levels. These may be checked every 5 years, or more frequently if you are over 75 years old.  Skin check.  Lung cancer screening. You may have this screening every year starting at age 2 if you have a 30-pack-year history of smoking and currently smoke or have quit within the past 15 years.  Fecal occult blood test (FOBT) of the stool. You may have this test every year starting at age 75.  Flexible sigmoidoscopy or colonoscopy. You may have a sigmoidoscopy every 5 years or a colonoscopy every 10 years starting at age 75.  Hepatitis C blood test.  Hepatitis B blood test.  Sexually transmitted disease (STD) testing.  Diabetes screening. This is done by checking your blood sugar (glucose) after you have not eaten for a while (fasting). You may have this done every 75 years.  Bone density scan. This is done to screen for osteoporosis. You may have this done starting at age 75.  Mammogram. This may be done every 1-2 years. Talk to your health care provider about how often you should have regular mammograms. Talk with your health care  provider about your test results, treatment options, and if necessary, the need for more tests. Vaccines  Your health care provider may recommend certain vaccines, such as:  Influenza vaccine. This is  recommended every year.  Tetanus, diphtheria, and acellular pertussis (Tdap, Td) vaccine. You may need a Td booster every 10 years.  Zoster vaccine. You may need this after age 75.  Pneumococcal 13-valent conjugate (PCV13) vaccine. One dose is recommended after age 75.  Pneumococcal polysaccharide (PPSV23) vaccine. One dose is recommended after age 75. Talk to your health care provider about which screenings and vaccines you need and how often you need them. This information is not intended to replace advice given to you by your health care provider. Make sure you discuss any questions you have with your health care provider. Document Released: 11/15/2015 Document Revised: 07/08/2016 Document Reviewed: 08/20/2015 Elsevier Interactive Patient Education  2017 [place] Prevention in the Home Falls can cause injuries. They can happen to people of all ages. There are many things you can do to make your home safe and to help prevent falls. What can I do on the outside of my home?  Regularly fix the edges of walkways and driveways and fix any cracks.  Remove anything that might make you trip as you walk through a door, such as a raised step or threshold.  Trim any bushes or trees on the path to your home.  Use bright outdoor lighting.  Clear any walking paths of anything that might make someone trip, such as rocks or tools.  Regularly check to see if handrails are loose or broken. Make sure that both sides of any steps have handrails.  Any raised decks and porches should have guardrails on the edges.  Have any leaves, snow, or ice cleared regularly.  Use sand or salt on walking paths during winter.  Clean up any spills in your garage right away. This includes oil or grease spills. What can I do in the bathroom?  Use night lights.  Install grab bars by the toilet and in the tub and shower. Do not use towel bars as grab bars.  Use non-skid mats or decals in the tub or  shower.  If you need to sit down in the shower, use a plastic, non-slip stool.  Keep the floor dry. Clean up any water that spills on the floor as soon as it happens.  Remove soap buildup in the tub or shower regularly.  Attach bath mats securely with double-sided non-slip rug tape.  Do not have throw rugs and other things on the floor that can make you trip. What can I do in the bedroom?  Use night lights.  Make sure that you have a light by your bed that is easy to reach.  Do not use any sheets or blankets that are too big for your bed. They should not hang down onto the floor.  Have a firm chair that has side arms. You can use this for support while you get dressed.  Do not have throw rugs and other things on the floor that can make you trip. What can I do in the kitchen?  Clean up any spills right away.  Avoid walking on wet floors.  Keep items that you use a lot in easy-to-reach places.  If you need to reach something above you, use a strong step stool that has a grab bar.  Keep electrical cords out of the way.  Do not use floor polish  or wax that makes floors slippery. If you must use wax, use non-skid floor wax.  Do not have throw rugs and other things on the floor that can make you trip. What can I do with my stairs?  Do not leave any items on the stairs.  Make sure that there are handrails on both sides of the stairs and use them. Fix handrails that are broken or loose. Make sure that handrails are as long as the stairways.  Check any carpeting to make sure that it is firmly attached to the stairs. Fix any carpet that is loose or worn.  Avoid having throw rugs at the top or bottom of the stairs. If you do have throw rugs, attach them to the floor with carpet tape.  Make sure that you have a light switch at the top of the stairs and the bottom of the stairs. If you do not have them, ask someone to add them for you. What else can I do to help prevent  falls?  Wear shoes that:  Do not have high heels.  Have rubber bottoms.  Are comfortable and fit you well.  Are closed at the toe. Do not wear sandals.  If you use a stepladder:  Make sure that it is fully opened. Do not climb a closed stepladder.  Make sure that both sides of the stepladder are locked into place.  Ask someone to hold it for you, if possible.  Clearly mark and make sure that you can see:  Any grab bars or handrails.  First and last steps.  Where the edge of each step is.  Use tools that help you move around (mobility aids) if they are needed. These include:  Canes.  Walkers.  Scooters.  Crutches.  Turn on the lights when you go into a dark area. Replace any light bulbs as soon as they burn out.  Set up your furniture so you have a clear path. Avoid moving your furniture around.  If any of your floors are uneven, fix them.  If there are any pets around you, be aware of where they are.  Review your medicines with your doctor. Some medicines can make you feel dizzy. This can increase your chance of falling. Ask your doctor what other things that you can do to help prevent falls. This information is not intended to replace advice given to you by your health care provider. Make sure you discuss any questions you have with your health care provider. Document Released: 08/15/2009 Document Revised: 03/26/2016 Document Reviewed: 11/23/2014 Elsevier Interactive Patient Education  2017 [employer].

## 2020-05-02 NOTE — Progress Notes (Signed)
Subjective:   Jodi Kirby is a 74 y.o. female who presents for Medicare Annual (Subsequent) preventive examination.   Review of Systems     Cardiac Risk Factors include: advanced age (>29men, >57 women);hypertension;dyslipidemia;obesity (BMI >30kg/m2)     Objective:    Today's Vitals   05/02/20 0820  BP: (!) 144/74  Pulse: 74  Resp: 16  Temp: (!) 97.1 F (36.2 C)  TempSrc: Temporal  Weight: 182 lb 11.2 oz (82.9 kg)  Height: 5\' 3"  (1.6 m)   Body mass index is 32.36 kg/m.  Advanced Directives 05/02/2020 05/02/2019 03/09/2018 02/09/2018 12/21/2017 05/17/2017 01/25/2017  Does Patient Have a Medical Advance Directive? No No No No No No No  Would patient like information on creating a medical advance directive? No - Patient declined Yes (MAU/Ambulatory/Procedural Areas - Information given) No - Patient declined No - Patient declined Yes (MAU/Ambulatory/Procedural Areas - Information given) - -    Current Medications (verified) Outpatient Encounter Medications as of 05/02/2020  Medication Sig  . acetaminophen (TYLENOL) 650 MG CR tablet Take 1 tablet daily as needed by mouth.  07/03/2020 alendronate (FOSAMAX) 70 MG tablet TAKE 1 TABLET WEEKLY  . amLODIPine-Valsartan-HCTZ 10-160-12.5 MG TABS Take 1 tablet (10-160-12.5 mg) by mouth once daily  . aspirin EC 81 MG tablet Take 81 mg by mouth every morning.  . Cholecalciferol (VITAMIN D3) 1000 UNITS CAPS Take 1,000 Units by mouth every morning.   . ezetimibe (ZETIA) 10 MG tablet Take 1 tablet (10 mg total) by mouth daily.  . fluticasone (FLONASE) 50 MCG/ACT nasal spray USE TWO SPRAY(S) IN EACH NOSTRIL AT BEDTIME  . Multiple Vitamins-Minerals (CENTRUM SILVER PO) Take 1 tablet by mouth every morning.   . nitroGLYCERIN (NITROSTAT) 0.4 MG SL tablet Place 1 tablet (0.4 mg total) under the tongue every 5 (five) minutes as needed for chest pain.  Marland Kitchen omeprazole (PRILOSEC) 20 MG capsule Take 1 capsule (20 mg total) by mouth daily.  . traZODone (DESYREL) 50  MG tablet Take 1 tablet (50 mg total) by mouth at bedtime as needed for sleep.  Marland Kitchen EPINEPHrine (EPIPEN 2-PAK) 0.3 mg/0.3 mL DEVI Inject 0.3 mg into the muscle as needed.   . [DISCONTINUED] amoxicillin (AMOXIL) 500 MG capsule Take 500 mg by mouth 3 (three) times daily.   No facility-administered encounter medications on file as of 05/02/2020.    Allergies (verified) Niaspan [niacin er], Oxycodone-acetaminophen, and Statins   History: Past Medical History:  Diagnosis Date  . Allergic rhinitis, cause unspecified   . Allergy   . Coronary artery disease   . Dysmetabolic syndrome X   . Dysuria   . Esophageal reflux   . Essential hypertension, benign   . HNP (herniated nucleus pulposus), lumbar    Dr. 07/03/2020 [facility])  . Hyperlipidemia   . Lumbago   . Lumbar radiculitis    Dr. SANFORD BISMARCK [facility])  . Lump or mass in breast   . Myalgia and myositis, unspecified   . Obstructive sleep apnea    CPAP  . Osteoarthrosis, unspecified whether generalized or localized, lower leg   . Other abnormal glucose   . Other and unspecified angina pectoris   . 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 Toxic effect of venom(989.5)   . Unspecified iridocyclitis   . Unspecified vitamin D deficiency   . Vertigo 2018   1 episode   Past Surgical History:  Procedure Laterality Date  . ABDOMINAL HYSTERECTOMY  1970  . ANKLE  SURGERY Right 1980  . BLADDER SURGERY  2012  . CARDIAC CATHETERIZATION  2003   [place]  . CARDIAC CATHETERIZATION     [place]  . CATARACT EXTRACTION W/PHACO Right 02/09/2018   Procedure: CATARACT EXTRACTION PHACO AND INTRAOCULAR LENS PLACEMENT (IOC) RIGHT;  Surgeon: Lockie Mola, MD;  [facility]: [facility];  Service: Ophthalmology;  Laterality: Right;  sleep apnea  . CATARACT EXTRACTION W/PHACO Left 03/09/2018   Procedure: CATARACT EXTRACTION PHACO AND INTRAOCULAR LENS PLACEMENT (IOC);  Surgeon: Lockie Mola, MD;  Location:  [facility];  Service: Ophthalmology;  Laterality: Left;  IVA TOPICALLEFT  . COLONOSCOPY  2008, 2015   Dr. Servando Snare  . CYSTOSTOMY    . KNEE SURGERY  08/18/2011   arthroscopic right  . LUMBAR FUSION     L4-5, S-1  . NECK SURGERY  2003  . TOTAL KNEE ARTHROPLASTY Right 04/29/2015   Procedure: TOTAL KNEE ARTHROPLASTY;  Surgeon: Erin Sons, MD;  Location: [facility];  Service: Orthopedics;  Laterality: Right;  . TUBAL LIGATION     Family History  Problem Relation Age of Onset  . Kidney disease Mother   . Hypertension Mother   . CAD Father   . Healthy Sister   . Kidney cancer Neg Hx   . Bladder Cancer Neg Hx    Social History   Socioeconomic History  . Marital status: Married    Spouse name: Morris  . Number of children: 4  . Years of education: Not on file  . Highest education level: 11th grade  Occupational History  . Occupation: Retired  Tobacco Use  . Smoking status: Never Smoker  . Smokeless tobacco: Never Used  . Tobacco comment: smoking cessation materials not required  Vaping Use  . Vaping Use: Never used  Substance and Sexual Activity  . Alcohol use: No    Alcohol/week: 0.0 standard drinks  . Drug use: No  . Sexual activity: Not Currently    Partners: Male    Birth control/protection: None  Other Topics Concern  . Not on file  Social History Narrative  . Not on file   Social Determinants of Health   Financial Resource Strain: Low Risk   . Difficulty of Paying Living Expenses: Not hard at all  Food Insecurity:   . Worried About Programme researcher, broadcasting/film/video in the Last Year:   . Barista in the Last Year:   Transportation Needs: No Transportation Needs  . Lack of Transportation (Medical): No  . Lack of Transportation (Non-Medical): No  Physical Activity: Inactive  . Days of Exercise per Week: 0 days  . Minutes of Exercise per Session: 0 min  Stress: No Stress Concern Present  . Feeling of Stress : Not at all  Social Connections: Socially  Integrated  . Frequency of Communication with Friends and Family: More than three times a week  . Frequency of Social Gatherings with Friends and Family: More than three times a week  . Attends Religious Services: More than 4 times per year  . Active Member of Clubs or Organizations: Yes  . Attends Banker Meetings: More than 4 times per year  . Marital Status: Married    Tobacco Counseling Counseling given: Not Answered Comment: smoking cessation materials not required   Clinical Intake:  Pre-visit preparation completed: Yes  Pain : No/denies pain     BMI - recorded: 32.36 Nutritional Status: BMI > 30  Obese Nutritional Risks: None Diabetes: No  How often do you need  to have someone help you when you read instructions, pamphlets, or other written materials from your doctor or pharmacy?: 1 - Never    Interpreter Needed?: No  Information entered by :: Reather Littler LPN   Activities of Daily Living In your present state of health, do you have any difficulty performing the following activities: 05/02/2020 04/15/2020  Hearing? N N  Comment declines hearing aids -  Vision? N N  Difficulty concentrating or making decisions? N N  Walking or climbing stairs? N N  Dressing or bathing? N N  Doing errands, shopping? N N  Preparing Food and eating ? N -  Using the Toilet? N -  In the past six months, have you accidently leaked urine? N -  Do you have problems with loss of bowel control? N -  Managing your Medications? N -  Managing your Finances? N -  Housekeeping or managing your Housekeeping? N -  Some recent data might be hidden    Patient Care Team: Alba Cory, MD as PCP - General Mariah Milling Tollie Pizza, MD as Consulting Physician (Cardiology) Vanna Scotland, MD as Consulting Physician (Urology)  Indicate any recent Medical Services you may have received from other than [facility] providers in the past year (date may be approximate).     Assessment:   This  is a routine wellness examination for Toniqua.  Hearing/Vision screen  Hearing Screening             Right ear:           Left ear:           Comments: Pt denies hearing difficulty  Vision Screening Comments: Annual vision screenings done at [facility]; due for exam  Dietary issues and exercise activities discussed: Current Exercise Habits: The patient does not participate in regular exercise at present, Exercise limited by: orthopedic condition(s)  Goals    . DIET - INCREASE WATER INTAKE     Recommend to drink at least 6-8 8oz glasses of water per day.    . Weight (lb) < 170 lb (77.1 kg)     Pt sates she would like to lose weight with healthy eating and physical activity       Depression Screen PHQ 2/9 Scores 05/02/2020 04/15/2020 10/16/2019 05/02/2019 09/20/2018 05/20/2018 12/21/2017  PHQ - 2 Score 0 0 0 0 0 0 0  PHQ- 9 Score - 0 0 0 0 0 -    Fall Risk Fall Risk  05/02/2020 04/15/2020 10/16/2019 05/02/2019 12/22/2018  Falls in the past year? 0 - 0 0 0  Number falls in past yr: 0 0 0 0 0  Injury with Fall? 0 0 0 0 0  Risk for fall due to : No Fall Risks - - - -  Follow up Falls prevention discussed - - Falls prevention discussed -    Any stairs in or around the home? Yes  If so, are there any without handrails? Yes  Home free of loose throw rugs in walkways, pet beds, electrical cords, etc? Yes  Adequate lighting in your home to reduce risk of falls? Yes   ASSISTIVE DEVICES UTILIZED TO PREVENT FALLS:  Life alert? No  Use of a cane, walker or w/c? No  Grab bars in the bathroom? Yes  Shower chair or bench in shower? Yes  Elevated toilet seat or a handicapped toilet? Yes   TIMED UP AND GO:  Was the test performed? Yes .  Length of time to ambulate 10 feet:  5 sec.   Gait steady and fast without use of assistive device  Cognitive Function:     6CIT Screen 05/02/2020 05/02/2019 12/21/2017  What Year? 0 points 0 points  0 points  What month? 0 points 0 points 0 points  What time? 0 points 0 points 0 points  Count back from 20 0 points 0 points 0 points  Months in reverse 0 points 2 points 2 points  Repeat phrase 2 points 2 points 6 points  Total Score 2 4 8     Immunizations Immunization History  Administered Date(s) Administered  . Influenza, High Dose Seasonal PF 09/15/2016, 07/07/2017  . Influenza-Unspecified 08/17/2014  . Pneumococcal Conjugate-13 12/05/2014  . Pneumococcal Polysaccharide-23 08/16/2007, 02/02/2013  . Tdap 02/23/2008  . Zoster 04/04/2008    TDAP status: Due, Education has been provided regarding the importance of this vaccine. Advised may receive this vaccine at local pharmacy or Health Dept. Aware to provide a copy of the vaccination record if obtained from local pharmacy or Health Dept. Verbalized acceptance and understanding.   Flu Vaccine status: Declined, Education has been provided regarding the importance of this vaccine but patient still declined. Advised may receive this vaccine at local pharmacy or Health Dept. Aware to provide a copy of the vaccination record if obtained from local pharmacy or Health Dept. Verbalized acceptance and understanding.   Pneumococcal vaccine status: Up to date   Covid-19 vaccine status: Declined, Education has been provided regarding the importance of this vaccine but patient still declined. Advised may receive this vaccine at local pharmacy or Health Dept.or vaccine clinic. Aware to provide a copy of the vaccination record if obtained from local pharmacy or Health Dept. Verbalized acceptance and understanding.  Qualifies for Shingles Vaccine? Yes   Zostavax completed Yes   Shingrix Completed?: No.    Education has been provided regarding the importance of this vaccine. Patient has been advised to call insurance company to determine out of pocket expense if they have not yet received this vaccine. Advised may also receive vaccine at local  pharmacy or Health Dept. Verbalized acceptance and understanding.  Screening Tests Health Maintenance  Topic Date Due  . COVID-19 Vaccine (1) Never done  . TETANUS/TDAP  10/15/2020 (Originally 02/22/2018)  . Hepatitis C Screening  08/25/2029 (Originally 1946-01-27)  . INFLUENZA VACCINE  06/02/2020  . MAMMOGRAM  10/15/2020  . COLONOSCOPY  12/15/2023  . DEXA SCAN  Completed  . PNA vac Low Risk Adult  Completed    Health Maintenance  Health Maintenance Due  Topic Date Due  . COVID-19 Vaccine (1) Never done    Colorectal cancer screening: Completed 12/14/13. Repeat every 10 years   Mammogram status: Completed 10/16/19. Repeat every year   Bone Density status: Completed 06/08/17. Results reflect: Bone density results: OSTEOPENIA. Repeat every 3 years.  Lung Cancer Screening: (Low Dose CT Chest recommended if Age 23-80 years, 30 pack-year currently smoking OR have quit w/in 15years.) does not qualify.   Additional Screening:  Hepatitis C Screening: does qualify; postponed  Vision Screening: Recommended annual ophthalmology exams for early detection of glaucoma and other disorders of the eye. Is the patient up to date with their annual eye exam?  No - postponed due to Covid Who is the provider or what is the name of the office in which the patient attends annual eye exams? [facility]  Dental Screening: Recommended annual dental exams for proper oral hygiene  Community Resource Referral / Chronic Care Management: CRR required this visit?  No   CCM required this visit?  No      Plan:     I have personally reviewed and noted the following in the patient's chart:   . Medical and social history . Use of alcohol, tobacco or illicit drugs  . Current medications and supplements . Functional ability and status . Nutritional status . Physical activity . Advanced directives . List of other physicians . Hospitalizations, surgeries, and ER visits in previous 12  months . Vitals . Screenings to include cognitive, depression, and falls . Referrals and appointments  In addition, I have reviewed and discussed with patient certain preventive protocols, quality metrics, and best practice recommendations. A written personalized care plan for preventive services as well as general preventive health recommendations were provided to patient.     Reather LittlerKasey Betha Shadix, LPN   1/6/10967/11/2019   Nurse Notes: pt doing well and appreciative of visit today. mammo and dexa ordered today to complete at the same time in December.

## 2020-05-07 DIAGNOSIS — R1031 Right lower quadrant pain: Secondary | ICD-10-CM | POA: Diagnosis not present

## 2020-05-07 DIAGNOSIS — M5416 Radiculopathy, lumbar region: Secondary | ICD-10-CM | POA: Diagnosis not present

## 2020-05-07 DIAGNOSIS — M5136 Other intervertebral disc degeneration, lumbar region: Secondary | ICD-10-CM | POA: Diagnosis not present

## 2020-05-07 DIAGNOSIS — M5126 Other intervertebral disc displacement, lumbar region: Secondary | ICD-10-CM | POA: Diagnosis not present

## 2020-05-07 DIAGNOSIS — M1611 Unilateral primary osteoarthritis, right hip: Secondary | ICD-10-CM | POA: Diagnosis not present

## 2020-06-10 ENCOUNTER — Telehealth: Payer: Self-pay | Admitting: Cardiovascular Disease

## 2020-06-10 NOTE — Telephone Encounter (Signed)
Patient calling in stating that she was trying to refill her amlodipine (10-160-12.5mg ) at [facility]. Patient was told that they no longer carried this, they also [facility], [facility] and [facility] and none of these locations had it either. [facility] pharmacist called the medication company and was told the company no longer had the medication.  Patient is no trying to stretch out her medication by breaking them in half, if she continues this she will run out on Thursday  Please advise

## 2020-06-11 NOTE — Telephone Encounter (Signed)
Spoke with patient and reviewed that some combination pills are no longer available but that I would review with provider and give her a call back with his recommendations. She verbalized understanding with no further questions at this time. I did call some other pharmacies as well but they no longer carry this pill. She will run out Thursday and needs orders for new medication.  Advised that I would give her a call back with his recommendations. She was appreciative for the call with no further questions at this time.

## 2020-06-12 ENCOUNTER — Other Ambulatory Visit: Payer: Self-pay | Admitting: Cardiovascular Disease

## 2020-06-12 MED ORDER — OLMESARTAN-AMLODIPINE-HCTZ 40-10-12.5 MG PO TABS: 1.0000 | ORAL_TABLET | Freq: Every day | ORAL | 3 refills | Status: DC

## 2020-06-12 NOTE — Telephone Encounter (Signed)
Spoke with patient and updated on provider recommendations and pharmacy is ordering and will be available tomorrow afternoon. She verbalized understanding with no further questions at this time.

## 2020-06-12 NOTE — Telephone Encounter (Signed)
Called pharmacy to see if they had it in stock and they currently do not have it. She did check with pharmacist to see if it could be ordered and they will get it ready for her tomorrow. Let them know I would reach out to patient and update.

## 2020-06-12 NOTE — Telephone Encounter (Signed)
I have sent in a different combination pill On olmesartan amlodipine HCTZ Can we contact [facility] to see if this 1 is in stock or if they have any other combination pill available that her insurance will cover

## 2020-06-27 ENCOUNTER — Other Ambulatory Visit: Payer: Self-pay | Admitting: Family Medicine

## 2020-06-27 DIAGNOSIS — M1611 Unilateral primary osteoarthritis, right hip: Secondary | ICD-10-CM

## 2020-07-16 ENCOUNTER — Other Ambulatory Visit: Payer: Self-pay

## 2020-07-16 ENCOUNTER — Ambulatory Visit
Admission: RE | Admit: 2020-07-16 | Discharge: 2020-07-16 | Disposition: A | Discharge status: Home / Self Care | Payer: Medicare PPO | Source: Ambulatory Visit | Attending: Family Medicine | Admitting: Family Medicine

## 2020-07-16 DIAGNOSIS — M1611 Unilateral primary osteoarthritis, right hip: Secondary | ICD-10-CM | POA: Insufficient documentation

## 2020-07-16 DIAGNOSIS — S73191A Other sprain of right hip, initial encounter: Secondary | ICD-10-CM | POA: Diagnosis not present

## 2020-07-16 DIAGNOSIS — Z981 Arthrodesis status: Secondary | ICD-10-CM | POA: Diagnosis not present

## 2020-07-16 DIAGNOSIS — D179 Benign lipomatous neoplasm, unspecified: Secondary | ICD-10-CM | POA: Diagnosis not present

## 2020-07-16 DIAGNOSIS — Z90710 Acquired absence of both cervix and uterus: Secondary | ICD-10-CM | POA: Diagnosis not present

## 2020-07-29 DIAGNOSIS — M461 Sacroiliitis, not elsewhere classified: Secondary | ICD-10-CM | POA: Diagnosis not present

## 2020-07-29 DIAGNOSIS — M1611 Unilateral primary osteoarthritis, right hip: Secondary | ICD-10-CM | POA: Diagnosis not present

## 2020-07-29 DIAGNOSIS — S73191A Other sprain of right hip, initial encounter: Secondary | ICD-10-CM | POA: Diagnosis not present

## 2020-07-30 DIAGNOSIS — M461 Sacroiliitis, not elsewhere classified: Secondary | ICD-10-CM | POA: Diagnosis not present

## 2020-08-03 ENCOUNTER — Other Ambulatory Visit: Payer: Self-pay | Admitting: Family Medicine

## 2020-08-03 DIAGNOSIS — K219 Gastro-esophageal reflux disease without esophagitis: Secondary | ICD-10-CM

## 2020-08-03 NOTE — Telephone Encounter (Signed)
Requested Prescriptions  Pending Prescriptions Disp Refills   omeprazole (PRILOSEC) 20 MG capsule [Pharmacy Med Name: Omeprazole 20 MG Oral Capsule Delayed Release] 90 capsule 0    Sig: Take 1 capsule by mouth once daily     Gastroenterology: Proton Pump Inhibitors Passed - 08/03/2020  7:45 AM      Passed - Valid encounter within last 12 months    Recent Outpatient Visits          3 months ago Benign hypertension with chronic kidney disease, stage II   [facility] [place], Danna Hefty, MD   9 months ago Atherosclerosis of native coronary artery of native heart with stable angina pectoris [facility])   [facility] [place], Danna Hefty, MD   1 year ago Atherosclerosis of native coronary artery of native heart with stable angina pectoris [facility])   [facility] Alba Cory, MD   1 year ago Essential hypertension   [facility] Alba Cory, MD   1 year ago Mixed hyperlipidemia   [facility] Alba Cory, MD      Future Appointments            In 2 months Carlynn Purl, Danna Hefty, MD [facility]   In 9 months  [facility]

## 2020-08-23 DIAGNOSIS — M48062 Spinal stenosis, lumbar region with neurogenic claudication: Secondary | ICD-10-CM | POA: Diagnosis not present

## 2020-08-23 DIAGNOSIS — M4807 Spinal stenosis, lumbosacral region: Secondary | ICD-10-CM | POA: Diagnosis not present

## 2020-08-23 DIAGNOSIS — I700 Atherosclerosis of aorta: Secondary | ICD-10-CM | POA: Diagnosis not present

## 2020-08-27 ENCOUNTER — Other Ambulatory Visit: Payer: Self-pay

## 2020-08-27 ENCOUNTER — Emergency Department
Admission: EM | Admit: 2020-08-27 | Discharge: 2020-08-27 | Disposition: A | Discharge status: Home / Self Care | Payer: Medicare PPO | Attending: Emergency Medicine | Admitting: Emergency Medicine

## 2020-08-27 ENCOUNTER — Emergency Department: Payer: Medicare PPO

## 2020-08-27 DIAGNOSIS — I10 Essential (primary) hypertension: Secondary | ICD-10-CM | POA: Diagnosis not present

## 2020-08-27 DIAGNOSIS — I2510 Atherosclerotic heart disease of native coronary artery without angina pectoris: Secondary | ICD-10-CM | POA: Diagnosis not present

## 2020-08-27 DIAGNOSIS — Z79899 Other long term (current) drug therapy: Secondary | ICD-10-CM | POA: Diagnosis not present

## 2020-08-27 DIAGNOSIS — M546 Pain in thoracic spine: Secondary | ICD-10-CM | POA: Diagnosis not present

## 2020-08-27 DIAGNOSIS — M549 Dorsalgia, unspecified: Secondary | ICD-10-CM

## 2020-08-27 MED ORDER — ONDANSETRON 4 MG PO TBDP
4.0000 mg | ORAL_TABLET | Freq: Once | ORAL | Status: AC
  Administered 2020-08-27: 4 mg via ORAL
  Filled 2020-08-27: qty 1

## 2020-08-27 MED ORDER — KETOROLAC TROMETHAMINE 30 MG/ML IJ SOLN
15.0000 mg | Freq: Once | INTRAMUSCULAR | Status: AC
  Administered 2020-08-27: 15 mg via INTRAMUSCULAR
  Filled 2020-08-27: qty 1

## 2020-08-27 MED ORDER — OXYCODONE-ACETAMINOPHEN 5-325 MG PO TABS
1.0000 | ORAL_TABLET | Freq: Once | ORAL | Status: AC
  Administered 2020-08-27: 1 via ORAL
  Filled 2020-08-27: qty 1

## 2020-08-27 MED ORDER — HYDROCODONE-ACETAMINOPHEN 5-325 MG PO TABS: 2.0000 | ORAL_TABLET | Freq: Four times a day (QID) | ORAL | 0 refills | Status: AC | PRN

## 2020-08-27 MED ORDER — ONDANSETRON 4 MG PO TBDP: 4.0000 mg | ORAL_TABLET | Freq: Three times a day (TID) | ORAL | 0 refills | Status: AC | PRN

## 2020-08-27 NOTE — ED Notes (Signed)
See triage note. Pt ambulatory to room with assistance by this RN. Pt not wanting wheelchair. Pt stating she has been having back pain for few weeks and has hx herniated disc. Pt stating taking tylenol at home with no relief

## 2020-08-27 NOTE — ED Triage Notes (Signed)
Pt in with co mid back pain states hx of ruptured discs in the past. Pt awaiting for MRI, tenderness noted upon palpation to spine between scapula. PT denies any new injury or trauma.

## 2020-08-27 NOTE — Discharge Instructions (Signed)
Please continue to work towards scheduling MRI of thoracic spine. You have been prescribed a short course of Norco for pain.

## 2020-08-27 NOTE — ED Provider Notes (Signed)
Emergency Department Provider Note  ____________________________________________  Time seen: Approximately 8:53 PM  I have reviewed the triage vital signs and the nursing notes.   HISTORY  Chief Complaint Back Pain   Historian Patient    HPI Jodi Kirby is a 74 y.o. female presents to the emergency department with upper back pain.  Patient states that she has been experiencing pain for the past several weeks and is in the process of being scheduled for an MRI of her thoracic spine.  Patient denies recent falls or other traumas.  She states that she can stand and ambulate but pain does make it harder for her to walk for prolonged periods of time.  No bowel or bladder incontinence or saddle anesthesia.  No fever or chills at home.  No increased urinary frequency or hematuria.   Past Medical History:  Diagnosis Date  . Allergic rhinitis, cause unspecified   . Allergy   . Coronary artery disease   . Dysmetabolic syndrome X   . Dysuria   . Esophageal reflux   . Essential hypertension, benign   . HNP (herniated nucleus pulposus), lumbar    Dr. Yves Dill [facility])  . Hyperlipidemia   . Lumbago   . Lumbar radiculitis    Dr. Yves [facility]ill [facility])  . Lump or mass in breast   . Myalgia and myositis, unspecified   . Obstructive sleep apnea    CPAP  . Osteoarthrosis, unspecified whether generalized or localized, lower leg   . Other abnormal glucose   . Other and unspecified angina pectoris   . 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 Toxic effect of venom(989.5)   . Unspecified iridocyclitis   . Unspecified vitamin D deficiency   . Vertigo 2018   1 episode     Immunizations up to date:  Yes.     Past Medical History:  Diagnosis Date  . Allergic rhinitis, cause unspecified   . Allergy   . Coronary artery disease   . Dysmetabolic syndrome X   . Dysuria   . Esophageal reflux   . Essential hypertension, benign   . HNP  (herniated nucleus pulposus), lumbar    Dr. Yves Dill [facility])  . Hyperlipidemia   . Lumbago   . Lumbar radiculitis    Dr. Yves Dill [facility])  . Lump or mass in breast   . Myalgia and myositis, unspecified   . Obstructive sleep apnea    CPAP  . Osteoarthrosis, unspecified whether generalized or localized, lower leg   . Other abnormal glucose   . Other and unspecified angina pectoris   . 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 Toxic effect of venom(989.5)   . Unspecified iridocyclitis   . Unspecified vitamin D deficiency   . Vertigo 2018   1 episode    Patient Active Problem List   Diagnosis Date Noted  . Benign hypertension with chronic kidney disease, stage III (HCC) 12/22/2018  . History of lumbar fusion 07/19/2018  . DDD (degenerative disc disease), lumbosacral 07/19/2018  . Obesity (BMI 30.0-34.9) 07/19/2018  . DOE (dyspnea on exertion) 07/11/2018  . Mild cognitive impairment 12/21/2017  . Aortic atherosclerosis (HCC) 05/15/2017  . Hyperglycemia 11/17/2016  . Osteopenia 05/15/2016  . Impingement syndrome of left shoulder 05/12/2016  . Insomnia 11/08/2015  . Angina pectoris (HCC) 07/10/2015  . Seasonal allergic rhinitis 07/09/2015  . GERD (gastroesophageal reflux disease) 07/09/2015  . Bee sting allergy 07/09/2015  .  History of knee replacement procedure of right knee 04/29/2015  . History of colonic polyps 09/24/2014  . Fibrocystic breast 09/24/2014  . Primary osteoarthritis of right knee 06/12/2014  . Hyperlipidemia 02/09/2013  . Atherosclerosis of native coronary artery of native heart with stable angina pectoris (HCC) 02/09/2013  . Obstructive sleep apnea on CPAP 02/09/2013  . Hypertension 02/09/2013    Past Surgical History:  Procedure Laterality Date  . ABDOMINAL HYSTERECTOMY  1970  . ANKLE SURGERY Right 1980  . BLADDER SURGERY  2012  . CARDIAC CATHETERIZATION  2003   [place]  . CARDIAC CATHETERIZATION      [place]  . CATARACT EXTRACTION W/PHACO Right 02/09/2018   Procedure: CATARACT EXTRACTION PHACO AND INTRAOCULAR LENS PLACEMENT (IOC) RIGHT;  Surgeon: Lockie Mola, MD;  Location: [facility];  Service: Ophthalmology;  Laterality: Right;  sleep apnea  . CATARACT EXTRACTION W/PHACO Left 03/09/2018   Procedure: CATARACT EXTRACTION PHACO AND INTRAOCULAR LENS PLACEMENT (IOC);  Surgeon: Lockie Mola, MD;  Location: [facility];  Service: Ophthalmology;  Laterality: Left;  IVA TOPICALLEFT  . COLONOSCOPY  2008, 2015   Dr. Servando Snare  . CYSTOSTOMY    . KNEE SURGERY  08/18/2011   arthroscopic right  . LUMBAR FUSION     L4-5, S-1  . NECK SURGERY  2003  . TOTAL KNEE ARTHROPLASTY Right 04/29/2015   Procedure: TOTAL KNEE ARTHROPLASTY;  Surgeon: Erin Sons, MD;  Location: [facility] ORS;  Service: Orthopedics;  Laterality: Right;  . TUBAL LIGATION      Prior to Admission medications   Medication Sig Start Date End Date Taking? Authorizing Provider  acetaminophen (TYLENOL) 650 MG CR tablet Take 1 tablet daily as needed by mouth.    [provider]  alendronate (FOSAMAX) 70 MG tablet TAKE 1 TABLET WEEKLY 02/16/20   Alba Cory, MD  aspirin EC 81 MG tablet Take 81 mg by mouth every morning.    [provider]  Cholecalciferol (VITAMIN D3) 1000 UNITS CAPS Take 1,000 Units by mouth every morning.     [provider]  EPINEPHrine (EPIPEN 2-PAK) 0.3 mg/0.3 mL DEVI Inject 0.3 mg into the muscle as needed.     [provider]  ezetimibe (ZETIA) 10 MG tablet Take 1 tablet (10 mg total) by mouth daily. 08/02/19   Antonieta Iba, MD  fluticasone (FLONASE) 50 MCG/ACT nasal spray USE TWO SPRAY(S) IN EACH NOSTRIL AT BEDTIME 10/16/19   Sowles, Danna Hefty, MD  HYDROcodone-acetaminophen (NORCO) 5-325 MG tablet Take 2 tablets by mouth every 6 (six) hours as needed for up to 2 days for moderate pain. 08/27/20 08/29/20  Orvil Feil, PA-C  Multiple  Vitamins-Minerals (CENTRUM SILVER PO) Take 1 tablet by mouth every morning.     [provider]  nitroGLYCERIN (NITROSTAT) 0.4 MG SL tablet Place 1 tablet (0.4 mg total) under the tongue every 5 (five) minutes as needed for chest pain. 07/08/16   Antonieta Iba, MD  Olmesartan-amLODIPine-HCTZ 40-10-12.5 MG TABS Take 1 tablet by mouth daily. 06/12/20   Antonieta Iba, MD  omeprazole (PRILOSEC) 20 MG capsule Take 1 capsule by mouth once daily 08/03/20   Alba Cory, MD  ondansetron (ZOFRAN ODT) 4 MG disintegrating tablet Take 1 tablet (4 mg total) by mouth every 8 (eight) hours as needed for up to 5 days. 08/27/20 09/01/20  Orvil Feil, PA-C  traZODone (DESYREL) 50 MG tablet Take 1 tablet (50 mg total) by mouth at bedtime as needed for sleep. 04/15/20   Sowles, Danna Hefty,  MD    Allergies Niaspan [niacin er], Oxycodone-acetaminophen, and Statins  Family History  Problem Relation Age of Onset  . Kidney disease Mother   . Hypertension Mother   . CAD Father   . Healthy Sister   . Kidney cancer Neg Hx   . Bladder Cancer Neg Hx     Social History Social History   Tobacco Use  . Smoking status: Never Smoker  . Smokeless tobacco: Never Used  . Tobacco comment: smoking cessation materials not required  Vaping Use  . Vaping Use: Never used  Substance Use Topics  . Alcohol use: No    Alcohol/week: 0.0 standard drinks  . Drug use: No     Review of Systems  Constitutional: No fever/chills Eyes:  No discharge ENT: No upper respiratory complaints. Respiratory: no cough. No SOB/ use of accessory muscles to breath Gastrointestinal:   No nausea, no vomiting.  No diarrhea.  No constipation. Musculoskeletal: Patient has thoracic back pain.  Skin: Negative for rash, abrasions, lacerations, ecchymosis.    ____________________________________________   PHYSICAL EXAM:  VITAL SIGNS: ED Triage Vitals [08/27/20 1923]  Enc Vitals Group     BP (!) 187/76     Pulse Rate 89      Resp 18     Temp 99 F (37.2 C)     Temp Source Oral     SpO2 100 %     Weight 182 lb (82.6 kg)     Height 5\' 3"  (1.6 m)     Head Circumference      Peak Flow      Pain Score 10     Pain Loc      Pain Edu?      Excl. in GC?      Constitutional: Alert and oriented. Well appearing and in no acute distress. Eyes: Conjunctivae are normal. PERRL. EOMI. Head: Atraumatic. Cardiovascular: Normal rate, regular rhythm. Normal S1 and S2.  Good peripheral circulation. Respiratory: Normal respiratory effort without tachypnea or retractions. Lungs CTAB. Good air entry to the bases with no decreased or absent breath sounds Gastrointestinal: Bowel sounds x 4 quadrants. Soft and nontender to palpation. No guarding or rigidity. No distention. Musculoskeletal: Full range of motion to all extremities. No obvious deformities noted.  Patient has midline thoracic back pain from T7-T9. Neurologic:  Normal for age. No gross focal neurologic deficits are appreciated.  Skin:  Skin is warm, dry and intact. No rash noted. Psychiatric: Mood and affect are normal for age. Speech and behavior are normal.   ____________________________________________   LABS (all labs ordered are listed, but only abnormal results are displayed)  Labs Reviewed - No data to display ____________________________________________  EKG   ____________________________________________  RADIOLOGY , personally viewed and evaluated these images (plain radiographs) as part of my medical decision making, as well as reviewing the written report by the radiologist.    DG Thoracic Spine 2 View  Result Date: 08/27/2020 CLINICAL DATA:  Upper back pain EXAM: THORACIC SPINE 2 VIEWS COMPARISON:  None. FINDINGS: Hardware in the lower cervical spine. Thoracic alignment within normal limits. Vertebral body heights are maintained. Degenerative osteophytes of the mid to lower thoracic spine. IMPRESSION: Degenerative changes.  No acute osseous abnormality. Electronically Signed   By: 08/29/2020 M.D.   On: 08/27/2020 21:17    ____________________________________________    PROCEDURES  Procedure(s) performed:     Procedures     Medications  oxyCODONE-acetaminophen (PERCOCET/ROXICET) 5-325 MG per tablet 1 tablet (  1 tablet Oral Given 08/27/20 2143)  ondansetron (ZOFRAN-ODT) disintegrating tablet 4 mg (4 mg Oral Given 08/27/20 2142)  ketorolac (TORADOL) 30 MG/ML injection 15 mg (15 mg Intramuscular Given 08/27/20 2143)     ____________________________________________   INITIAL IMPRESSION / ASSESSMENT AND PLAN / ED COURSE  Pertinent labs & imaging results that were available during my care of the patient were reviewed by me and considered in my medical decision making (see chart for details).      Assessment and plan: Back pain 74 year old female presents to the emergency department with upper back pain that has persisted for the past several weeks.  Patient was hypertensive at triage but vital signs were otherwise reassuring.  She had midline thoracic spine tenderness on exam.  Will obtain x-rays of the thoracic spine and administer Roxicet and low-dose Toradol and will reassess.  Patient's pain improved significantly in the emergency department after aforementioned medications were administered. Patient was discharged with a short course of Norco while she awaits MRI of her thoracic spine. Return precautions were given to return with new or worsening symptoms. All patient questions were answered. ____________________________________________  FINAL CLINICAL IMPRESSION(S) / ED DIAGNOSES  Final diagnoses:  Upper back pain      NEW MEDICATIONS STARTED DURING THIS VISIT:  ED Discharge Orders         Ordered    HYDROcodone-acetaminophen (NORCO) 5-325 MG tablet  Every 6 hours PRN        08/27/20 2221    ondansetron (ZOFRAN ODT) 4 MG disintegrating tablet  Every 8 hours PRN         08/27/20 2221              This chart was dictated using voice recognition software/Dragon. Despite best efforts to proofread, errors can occur which can change the meaning. Any change was purely unintentional.     Orvil FeilWoods, Yanely Mast M, PA-C 08/27/20 2238    Shaune PollackIsaacs, [place], MD 09/08/20 [phone]

## 2020-09-05 ENCOUNTER — Telehealth: Payer: Self-pay

## 2020-09-05 DIAGNOSIS — M8580 Other specified disorders of bone density and structure, unspecified site: Secondary | ICD-10-CM

## 2020-09-05 NOTE — Telephone Encounter (Signed)
Patient is here with her husband today and she said you were suppose to order a DEXA scan on her and I don't see it. She wants the order to go to [facility].

## 2020-09-10 NOTE — Progress Notes (Signed)
Office Visit    Patient Name: Jodi Kirby Date of Encounter: 09/11/2020  Primary Care Provider:  Alba Cory, MD Primary Cardiologist:  Lorine Bears, MD Electrophysiologist:  None   Chief Complaint    Jodi Kirby is a 74 y.o. female with a hx of CAD, HLD, OSA on CPAP, HTN, GERD presents today for follow up of CAD   Past Medical History    Past Medical History:  Diagnosis Date  . Allergic rhinitis, cause unspecified   . Allergy   . Coronary artery disease   . Dysmetabolic syndrome X   . Dysuria   . Esophageal reflux   . Essential hypertension, benign   . HNP (herniated nucleus pulposus), lumbar    Dr. Yves Dill [facility])  . Hyperlipidemia   . Lumbago   . Lumbar radiculitis    Dr. Yves Dill [facility])  . Lump or mass in breast   . Myalgia and myositis, unspecified   . Obstructive sleep apnea    CPAP  . Osteoarthrosis, unspecified whether generalized or localized, lower leg   . Other abnormal glucose   . Other and unspecified angina pectoris   . 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 Toxic effect of venom(989.5)   . Unspecified iridocyclitis   . Unspecified vitamin D deficiency   . Vertigo 2018   1 episode   Past Surgical History:  Procedure Laterality Date  . ABDOMINAL HYSTERECTOMY  1970  . ANKLE SURGERY Right 1980  . BLADDER SURGERY  2012  . CARDIAC CATHETERIZATION  2003   [place]  . CARDIAC CATHETERIZATION     [place]  . CATARACT EXTRACTION W/PHACO Right 02/09/2018   Procedure: CATARACT EXTRACTION PHACO AND INTRAOCULAR LENS PLACEMENT (IOC) RIGHT;  Surgeon: Lockie Mola, MD;  Location: [facility];  Service: Ophthalmology;  Laterality: Right;  sleep apnea  . CATARACT EXTRACTION W/PHACO Left 03/09/2018   Procedure: CATARACT EXTRACTION PHACO AND INTRAOCULAR LENS PLACEMENT (IOC);  Surgeon: Lockie Mola, MD;  Location: [facility];  Service: Ophthalmology;  Laterality: Left;   IVA TOPICALLEFT  . COLONOSCOPY  2008, 2015   Dr. Servando Snare  . CYSTOSTOMY    . KNEE SURGERY  08/18/2011   arthroscopic right  . LUMBAR FUSION     L4-5, S-1  . NECK SURGERY  2003  . TOTAL KNEE ARTHROPLASTY Right 04/29/2015   Procedure: TOTAL KNEE ARTHROPLASTY;  Surgeon: Erin Sons, MD;  Location: [facility] ORS;  Service: Orthopedics;  Laterality: Right;  . TUBAL LIGATION      Allergies  Allergies  Allergen Reactions  . Niaspan [Niacin Er] Other (See Comments)    Patient states she can't remember the reaction because it so long ago.  . Oxycodone-Acetaminophen Nausea Only  . Statins Other (See Comments)    Muscle and joint pain.    History of Present Illness    Jodi Kirby is a 74 y.o. female with a hx of CAD, HLD, HTN, OSA on CPAP last seen 08/02/19 via telemedicine by Dr. Mariah Milling.  She had cardiac catheterization in 2003 with 20% disease in RCA and LAD. Stress test 2014 with no ischemia. Admitted 06/2016 for chest pain with negative troponin. Exercise stress myoview 07/2016 with no evidence of ischemia and EF 62%. She has history of intolerance to statins including low dose Crestor.   When last seen 07/2019 she was doing well and tolerating Zetia.  She presents today for follow-up.  Tells me she enjoys spending her time doing  puzzles to keep her mind sharp.  Her husband was in ill health last year but he has been improving.  She has been following with orthopedic surgery due to spinal stenosis and back pain and has upcoming MRI.  This is understandably limited her her exercise but she tries to walk to the mailbox at least once per day and stay active around her home.  Endorses eating low-salt, heart diet.  Denies chest pain, pressure, tightness.  No shortness of breath at rest nor dyspnea on exertion.    EKGs/Labs/Other Studies Reviewed:   The following studies were reviewed today:  EKG:  EKG is ordered today.  The ekg ordered today demonstrates NSR 70 bpm with no acute ST/T wave  changes  Recent Labs: 04/15/2020: ALT 21; BUN 15; Creat 1.11; Hemoglobin 10.8; Platelets 260; Potassium 4.0; Sodium 142  Recent Lipid Panel    Component Value Date/Time   CHOL 187 04/15/2020 0916   CHOL 211 (H) 11/08/2015 1101   TRIG 157 (H) 04/15/2020 0916   HDL 53 04/15/2020 0916   HDL 48 11/08/2015 1101   CHOLHDL 3.5 04/15/2020 0916   VLDL 38 (H) 05/21/2017 0809   LDLCALC 107 (H) 04/15/2020 0916    Home Medications   No outpatient medications have been marked as taking for the 09/11/20 encounter (Office Visit) with Alver Sorrow, NP.     Review of Systems  All other systems reviewed and are otherwise negative except as noted above.  Physical Exam    VS:  BP 120/66 (BP Location: Left Arm, Patient Position: Sitting, Cuff Size: Normal)   Pulse 70   Ht 5\' 3"  (1.6 m)   Wt 184 lb (83.5 kg)   SpO2 97%   BMI 32.59 kg/m  , BMI Body mass index is 32.59 kg/m.  Wt Readings from Last 3 Encounters:  09/11/20 184 lb (83.5 kg)  08/27/20 182 lb (82.6 kg)  05/02/20 182 lb 11.2 oz (82.9 kg)    GEN: Well nourished, overweight, well developed, in no acute distress. HEENT: normal. Neck: Supple, no JVD, carotid bruits, or masses. Cardiac: RRR, no murmurs, rubs, or gallops. No clubbing, cyanosis, edema.  Radials/PT 2+ and equal bilaterally.  Respiratory:  Respirations regular and unlabored, clear to auscultation bilaterally. GI: Soft, nontender, nondistended. MS: No deformity or atrophy. Skin: Warm and dry, no rash. Neuro:  Strength and sensation are intact. Psych: Normal affect.  Assessment & Plan    1. CAD -stable with no anginal symptoms.  EKG today with no acute ST/T wave changes.  No negation for ischemic evaluation at this time.  GDMT includes aspirin, Zetia.  Refill provided.  No statin secondary to intolerance.  2. HTN - BP well controlled. Continue current antihypertensive regimen of olmesartan-amlodipine-HCTZ 40-10-12.5 mg daily.  Refill provided  3. HLD - 04/05/20  total cholesterol 187, HDL 53, LDL 107, triglycerides 157.  History of intolerance to atorvastatin and rosuvastatin, as below.04/15/2020 LDL 107 with goal less than 70 in the setting of coronary disease.  We discussed possibility of transition to Nexlizet (bempedoic acid/ezetimibe) though she will proceed first with her back pain and will let our office know if she wishes to make the transition.  Provided her handout on her after visit summary with information about the medication  4. Statin intolerance / Myalgias -previous intolerance of atorvastatin and rosuvastatin with myalgias.  She tolerates Zetia 10 mg daily without difficulty.  04/15/2020 LDL 107 with goal less than 70 in the setting of coronary disease.  We  discussed possibility of transition to Nexlizet (bempedoic acid/ezetimibe) though she will proceed first with her back pain and will let our office know if she wishes to make the transition.  Provided her handout on her after visit summary with information about the medication.  Disposition: Follow up in 1 year(s) with Dr. Mariah Milling or APP   Signed, Alver Sorrow, NP 09/11/2020, 10:55 AM [facility]

## 2020-09-11 ENCOUNTER — Encounter: Payer: Self-pay | Admitting: Family

## 2020-09-11 ENCOUNTER — Other Ambulatory Visit: Payer: Self-pay

## 2020-09-11 ENCOUNTER — Ambulatory Visit (INDEPENDENT_AMBULATORY_CARE_PROVIDER_SITE_OTHER): Payer: Medicare PPO | Admitting: Family

## 2020-09-11 VITALS — BP 120/66 | HR 70 | Ht 63.0 in | Wt 184.0 lb

## 2020-09-11 DIAGNOSIS — Z789 Other specified health status: Secondary | ICD-10-CM

## 2020-09-11 DIAGNOSIS — I25118 Atherosclerotic heart disease of native coronary artery with other forms of angina pectoris: Secondary | ICD-10-CM | POA: Diagnosis not present

## 2020-09-11 DIAGNOSIS — E782 Mixed hyperlipidemia: Secondary | ICD-10-CM | POA: Diagnosis not present

## 2020-09-11 DIAGNOSIS — M791 Myalgia, unspecified site: Secondary | ICD-10-CM

## 2020-09-11 DIAGNOSIS — I10 Essential (primary) hypertension: Secondary | ICD-10-CM | POA: Diagnosis not present

## 2020-09-11 MED ORDER — OLMESARTAN-AMLODIPINE-HCTZ 40-10-12.5 MG PO TABS: 1.0000 | ORAL_TABLET | Freq: Every day | ORAL | 3 refills | Status: DC

## 2020-09-11 MED ORDER — EZETIMIBE 10 MG PO TABS: 10.0000 mg | ORAL_TABLET | Freq: Every day | ORAL | 3 refills | Status: DC

## 2020-09-11 NOTE — Patient Instructions (Signed)
Medication Instructions:  No medication changes today.   *If you need a refill on your cardiac medications before your next appointment, please call your pharmacy*   Lab Work: None ordered today.   If you have labs (blood work) drawn today and your tests are completely normal, you will receive your results only by: Marland Kitchen MyChart Message (if you have MyChart) OR . A paper copy in the mail If you have any lab test that is abnormal or we need to change your treatment, we will call you to review the results.  Testing/Procedures: Your EKG today showed normal sinus rhythm and is stable compared to previous.   Follow-Up: At [facility], you and your health needs are our priority.  As part of our continuing mission to provide you with exceptional heart care, we have created designated Provider Care Teams.  These Care Teams include your primary Cardiologist (physician) and Advanced Practice Providers (APPs -  Physician Assistants and Nurse Practitioners) who all work together to provide you with the care you need, when you need it.  We recommend signing up for the patient portal called "MyChart".  Sign up information is provided on this After Visit Summary.  MyChart is used to connect with patients for Virtual Visits (Telemedicine).  Patients are able to view lab/test results, encounter notes, upcoming appointments, etc.  Non-urgent messages can be sent to your provider as well.   To learn more about what you can do with MyChart, go to [email or web address].    Your next appointment:   1 year(s)  The format for your next appointment:   In Person  Provider:   You may see Lorine Bears, MD or one of the following Advanced Practice Providers on your designated Care Team:    Nicolasa Ducking, NP  Eula Listen, PA-C  Marisue Ivan, PA-C  Cadence Fransico Michael, [place]   Other Instructions  We have included information on a medication called Nexlizet for you to consider. This is a combination  tablet of Bempedoic acid and Ezetimibe (Zetia). This helps specifically target and lower your LDL or "lousy cholesterol". If you decide you want to start this medication, simply call our office and let us know. Our goal is for your LDL or "lousy cholesterol" to be closer to 70. On your lab work in June it was 106.  Bempedoic acid; Ezetimibe Tablets What is this medicine? BEMPEDOIC ACID; EZETIMIBE (BEM pe DOE ik AS id; ez ET i mibe) is used to lower the level of cholesterol in the blood. It is used with other cholesterol-lowering drugs. This medicine may be used for other purposes; ask your health care provider or pharmacist if you have questions. COMMON BRAND NAME(S): NEXLIZET What should I tell my health care provider before I take this medicine? They need to know if you have any of these conditions:  gout  kidney problems  liver problems  tendon problems  an unusual or allergic reaction to bempedoic acid, ezetimibe, other medicines, foods, dyes, or preservatives  pregnant or trying to become pregnant  breast-feeding How should I use this medicine? Take this medicine by mouth with a glass of water. Follow the directions on the prescription label. Do not cut, crush, or chew this medicine. Swallow the tablets whole. You can take it with or without food. If it upsets your stomach, take it with food. Take your doses at regular intervals. Do not take your medicine more often than directed. Talk to your pediatrician about the use of this medicine in children.  Special care may be needed. Overdosage: If you think you have taken too much of this medicine contact a poison control center or emergency room at once. NOTE: This medicine is only for you. Do not share this medicine with others. What if I miss a dose? If you miss a dose, take it as soon as you can. If it is almost time for your next dose, take only that dose. Do not take double or extra doses. What may interact with this medicine? Do  not take this medicine with any of the following medications:  fenofibrate  gemfibrozil This medicine may also interact with the following medications:  antacids  cyclosporine  pravastatin  simvastatin  other medicines to lower cholesterol or triglycerides This list may not describe all possible interactions. Give your health care provider a list of all the medicines, herbs, non-prescription drugs, or dietary supplements you use. Also tell them if you smoke, drink alcohol, or use illegal drugs. Some items may interact with your medicine. What should I watch for while using this medicine? Visit your health care professional for regular checks on your progress. Tell your health care professional if your symptoms do not start to get better or if they get worse. You may need blood work done while you are taking this medicine. This drug is only part of a total heart-health program. Your doctor or a dietician can suggest a low-cholesterol and low-fat diet to help. Avoid alcohol and smoking, and keep a proper exercise schedule. What side effects may I notice from receiving this medicine? Side effects that you should report to your doctor or health care professional as soon as possible:  allergic reactions like skin rash, itching or hives, swelling of the face, lips, or tongue  signs of gout such as swollen, red, warm, or tender joints, especially in the toes  signs of tendon problems such as tendon pain or swelling or if you are unable to move a joint Side effects that usually do not require medical attention (report these to your doctor or health care professional if they continue or are bothersome):  back pain  cold or flu-like symptoms  headache  muscle spasms  stomach upset or pain This list may not describe all possible side effects. Call your doctor for medical advice about side effects. You may report side effects to FDA at 1-800-FDA-1088. Where should I keep my  medicine? Keep out of the reach of children. Store at room temperature between 15 and 30 degrees C (59 and 86 degrees F). Keep this medicine in the original container. Do not throw out the packet in the container. It keeps the medicine dry. Throw away any unused medication after the expiration date. NOTE: This sheet is a summary. It may not cover all possible information. If you have questions about this medicine, talk to your doctor, pharmacist, or health care provider.  2020 Elsevier/Gold Standard (2019-01-04 13:09:47)

## 2020-09-16 DIAGNOSIS — G4733 Obstructive sleep apnea (adult) (pediatric): Secondary | ICD-10-CM | POA: Diagnosis not present

## 2020-09-19 ENCOUNTER — Other Ambulatory Visit: Payer: Self-pay | Admitting: Cardiovascular Disease

## 2020-09-19 DIAGNOSIS — Z789 Other specified health status: Secondary | ICD-10-CM

## 2020-09-19 DIAGNOSIS — E782 Mixed hyperlipidemia: Secondary | ICD-10-CM

## 2020-09-19 MED ORDER — EZETIMIBE 10 MG PO TABS: 10.0000 mg | ORAL_TABLET | Freq: Every day | ORAL | 3 refills | Status: DC

## 2020-09-19 NOTE — Telephone Encounter (Signed)
Rx request transferred to [facility] on [address] .

## 2020-09-19 NOTE — Telephone Encounter (Signed)
Patient calling  States that ezetimbie (Zetia) medication needs to be transferred over to [facility] in [place]

## 2020-09-24 DIAGNOSIS — M48062 Spinal stenosis, lumbar region with neurogenic claudication: Secondary | ICD-10-CM | POA: Diagnosis not present

## 2020-09-24 DIAGNOSIS — M6281 Muscle weakness (generalized): Secondary | ICD-10-CM | POA: Diagnosis not present

## 2020-10-01 ENCOUNTER — Other Ambulatory Visit: Payer: Self-pay | Admitting: Orthopedic Surgery

## 2020-10-01 DIAGNOSIS — M5416 Radiculopathy, lumbar region: Secondary | ICD-10-CM | POA: Diagnosis not present

## 2020-10-01 DIAGNOSIS — E669 Obesity, unspecified: Secondary | ICD-10-CM | POA: Diagnosis not present

## 2020-10-01 DIAGNOSIS — M4807 Spinal stenosis, lumbosacral region: Secondary | ICD-10-CM | POA: Diagnosis not present

## 2020-10-01 DIAGNOSIS — M48062 Spinal stenosis, lumbar region with neurogenic claudication: Secondary | ICD-10-CM | POA: Diagnosis not present

## 2020-10-01 DIAGNOSIS — M5441 Lumbago with sciatica, right side: Secondary | ICD-10-CM | POA: Diagnosis not present

## 2020-10-09 ENCOUNTER — Ambulatory Visit
Admission: RE | Admit: 2020-10-09 | Discharge: 2020-10-09 | Disposition: A | Discharge status: Home / Self Care | Payer: Medicare PPO | Source: Ambulatory Visit | Attending: Orthopedic Surgery | Admitting: Orthopedic Surgery

## 2020-10-09 ENCOUNTER — Other Ambulatory Visit: Payer: Self-pay

## 2020-10-09 DIAGNOSIS — M48061 Spinal stenosis, lumbar region without neurogenic claudication: Secondary | ICD-10-CM | POA: Diagnosis not present

## 2020-10-09 DIAGNOSIS — M4807 Spinal stenosis, lumbosacral region: Secondary | ICD-10-CM | POA: Insufficient documentation

## 2020-10-09 DIAGNOSIS — M5136 Other intervertebral disc degeneration, lumbar region: Secondary | ICD-10-CM | POA: Diagnosis not present

## 2020-10-09 DIAGNOSIS — M5441 Lumbago with sciatica, right side: Secondary | ICD-10-CM | POA: Diagnosis not present

## 2020-10-15 ENCOUNTER — Ambulatory Visit (INDEPENDENT_AMBULATORY_CARE_PROVIDER_SITE_OTHER): Payer: Medicare PPO | Admitting: Family Medicine

## 2020-10-15 ENCOUNTER — Encounter: Payer: Self-pay | Admitting: Family Medicine

## 2020-10-15 ENCOUNTER — Other Ambulatory Visit: Payer: Self-pay

## 2020-10-15 VITALS — BP 126/74 | HR 85 | Temp 97.9°F | Resp 16 | Ht 63.0 in | Wt 182.6 lb

## 2020-10-15 DIAGNOSIS — I700 Atherosclerosis of aorta: Secondary | ICD-10-CM

## 2020-10-15 DIAGNOSIS — K219 Gastro-esophageal reflux disease without esophagitis: Secondary | ICD-10-CM

## 2020-10-15 DIAGNOSIS — M5137 Other intervertebral disc degeneration, lumbosacral region: Secondary | ICD-10-CM

## 2020-10-15 DIAGNOSIS — N183 Chronic kidney disease, stage 3 unspecified: Secondary | ICD-10-CM

## 2020-10-15 DIAGNOSIS — M8580 Other specified disorders of bone density and structure, unspecified site: Secondary | ICD-10-CM | POA: Diagnosis not present

## 2020-10-15 DIAGNOSIS — R739 Hyperglycemia, unspecified: Secondary | ICD-10-CM

## 2020-10-15 DIAGNOSIS — G4733 Obstructive sleep apnea (adult) (pediatric): Secondary | ICD-10-CM

## 2020-10-15 DIAGNOSIS — I25118 Atherosclerotic heart disease of native coronary artery with other forms of angina pectoris: Secondary | ICD-10-CM

## 2020-10-15 DIAGNOSIS — Z981 Arthrodesis status: Secondary | ICD-10-CM | POA: Diagnosis not present

## 2020-10-15 DIAGNOSIS — E785 Hyperlipidemia, unspecified: Secondary | ICD-10-CM

## 2020-10-15 DIAGNOSIS — G4700 Insomnia, unspecified: Secondary | ICD-10-CM

## 2020-10-15 DIAGNOSIS — Z780 Asymptomatic menopausal state: Secondary | ICD-10-CM

## 2020-10-15 DIAGNOSIS — Z9989 Dependence on other enabling machines and devices: Secondary | ICD-10-CM

## 2020-10-15 DIAGNOSIS — M791 Myalgia, unspecified site: Secondary | ICD-10-CM

## 2020-10-15 DIAGNOSIS — M5416 Radiculopathy, lumbar region: Secondary | ICD-10-CM | POA: Diagnosis not present

## 2020-10-15 DIAGNOSIS — I129 Hypertensive chronic kidney disease with stage 1 through stage 4 chronic kidney disease, or unspecified chronic kidney disease: Secondary | ICD-10-CM | POA: Diagnosis not present

## 2020-10-15 DIAGNOSIS — I209 Angina pectoris, unspecified: Secondary | ICD-10-CM | POA: Diagnosis not present

## 2020-10-15 DIAGNOSIS — D638 Anemia in other chronic diseases classified elsewhere: Secondary | ICD-10-CM

## 2020-10-15 DIAGNOSIS — N182 Chronic kidney disease, stage 2 (mild): Secondary | ICD-10-CM

## 2020-10-15 DIAGNOSIS — T466X5A Adverse effect of antihyperlipidemic and antiarteriosclerotic drugs, initial encounter: Secondary | ICD-10-CM

## 2020-10-15 MED ORDER — OMEPRAZOLE 20 MG PO CPDR: 20.0000 mg | DELAYED_RELEASE_CAPSULE | Freq: Every day | ORAL | 1 refills | Status: DC

## 2020-10-15 MED ORDER — ICOSAPENT ETHYL 1 G PO CAPS: 2.0000 g | ORAL_CAPSULE | Freq: Two times a day (BID) | ORAL | 1 refills | Status: DC

## 2020-10-15 MED ORDER — TRAZODONE HCL 50 MG PO TABS: 50.0000 mg | ORAL_TABLET | Freq: Every evening | ORAL | 1 refills | Status: DC | PRN

## 2020-10-15 NOTE — Progress Notes (Signed)
Name: Jodi Kirby   MRN: [id number]    DOB: 05-Aug-1946   Date:10/15/2020       Progress Note  Subjective  Chief Complaint  Follow Up  HPI  OSA: she usually wears her CPAP every night, however recently unable to rest well due to worsening of lower back pain   Insomnia: she does not take it daily, she states when she takes medication she is able to fall and stay asleep, she can sleep at least 7 hours on medication   DDD lumbar spine:  History of spinal fusion and L3-4 .She had repeat MRI done ( see below), she has a visit scheduled today with Dr. Adriana Simas to discuss results and come up with a plan. Pain is now constant/sharp  8 is average now but can go higher, on lower back and occasionally goes down right lower lateral leg but not donw to her foot , very uncomfortable and is affecting with ADL. She has to shift positions, it is also affectins her sleep. She is only on Tylenol  . Discussed resuming Gabapentin, or consider Lyrica , but we will wait to see what Dr. Adriana Simas recommends   IMPRESSION: 10/09/2020  1. Mildly progressive disc degeneration at L2-3 with moderate spinal stenosis and mild-to-moderate left greater than right neural foraminal and lateral recess stenosis. 2. Postoperative changes at L3-4 and L4-5 with unchanged residual spinal and neural foraminal stenosis at L3-4.  Knee replacement surgery right: surgery was June 27 th, 2016, she has chronic right knee pain, sometimes has effusion, having more pain due to back pain   HTNwith CKI:she is back on one pill Tribenzor  no recent episodes of chest pain or palpitation, denies orthopnea . She has good urine output and not pruritisBP has been controlled at home around 130   Hyperlipidemia/aorta atherosclerosis: unable to tolerate statins because it causes myopahty Dr. Epifanio Lesches her on Zetia 07/2018 and is tolerating it well, however LDL not at goal ( 107 LDL) , we will add Vascepa.   GERD: under control with  Omeprazole in am'sand symptoms have been well controlled.No heartburn, no regurgitation, no change in bowel movements  Unchanged    Angina Pectoris: she has CAD, mild disease, seeing Dr. Mariah Milling.Taking aspirin and ARB, not on beta-blocker because of bradycardia and not on statin because of intolerance, but is now on Zetiaand last LDL 107. She states she has intermittent chest pain but not recently, she has NTG at home but has not used it in a while   Osteopenia: diagnosed in 2016 - and was started on Fosamax,she is doing well, last bone density in 2018, no side effects of medication.-2.4 spine -2.2 of femur, she is due for repeat bone density , scheduled for next week   Hyperglycemia: she denies polyphagia, polydipsia or polyuria. A1C was 5.5% to 5.7% last A1C 5.4 %  Mild cognitive dysfunction:CIT was 8 in 2020  she has been doingcross work puzzles, word search and has been doing well. Unchanged    Patient Active Problem List   Diagnosis Date Noted  . Benign hypertension with chronic kidney disease, stage III (HCC) 12/22/2018  . History of lumbar fusion 07/19/2018  . DDD (degenerative disc disease), lumbosacral 07/19/2018  . Obesity (BMI 30.0-34.9) 07/19/2018  . DOE (dyspnea on exertion) 07/11/2018  . Mild cognitive impairment 12/21/2017  . Aortic atherosclerosis (HCC) 05/15/2017  . Hyperglycemia 11/17/2016  . Osteopenia 05/15/2016  . Impingement syndrome of left shoulder 05/12/2016  . Insomnia 11/08/2015  . Angina pectoris (HCC)  07/10/2015  . Seasonal allergic rhinitis 07/09/2015  . GERD (gastroesophageal reflux disease) 07/09/2015  . Bee sting allergy 07/09/2015  . History of knee replacement procedure of right knee 04/29/2015  . History of colonic polyps 09/24/2014  . Fibrocystic breast 09/24/2014  . Primary osteoarthritis of right knee 06/12/2014  . Hyperlipidemia 02/09/2013  . Atherosclerosis of native coronary artery of native heart with stable angina pectoris  (HCC) 02/09/2013  . Obstructive sleep apnea on CPAP 02/09/2013  . Hypertension 02/09/2013    Past Surgical History:  Procedure Laterality Date  . ABDOMINAL HYSTERECTOMY  1970  . ANKLE SURGERY Right 1980  . BLADDER SURGERY  2012  . CARDIAC CATHETERIZATION  2003   [place]  . CARDIAC CATHETERIZATION     [place]  . CATARACT EXTRACTION W/PHACO Right 02/09/2018   Procedure: CATARACT EXTRACTION PHACO AND INTRAOCULAR LENS PLACEMENT (IOC) RIGHT;  Surgeon: Lockie MolaBrasington, Chadwick, MD;  [facility];  Service: Ophthalmology;  Laterality: Right;  sleep apnea  . CATARACT EXTRACTION W/PHACO Left 03/09/2018   Procedure: CATARACT EXTRACTION PHACO AND INTRAOCULAR LENS PLACEMENT (IOC);  Surgeon: Lockie MolaBrasington, Chadwick, MD;  [facility];  Service: Ophthalmology;  Laterality: Left;  IVA TOPICALLEFT  . COLONOSCOPY  2008, 2015   Dr. Servando SnareWohl  . CYSTOSTOMY    . KNEE SURGERY  08/18/2011   arthroscopic right  . LUMBAR FUSION     L4-5, S-1  . NECK SURGERY  2003  . TOTAL KNEE ARTHROPLASTY Right 04/29/2015   Procedure: TOTAL KNEE ARTHROPLASTY;  Surgeon: Erin SonsHarold Kernodle, MD;  Location: [facility];  Service: Orthopedics;  Laterality: Right;  . TUBAL LIGATION      Family History  Problem Relation Age of Onset  . Kidney disease Mother   . Hypertension Mother   . CAD Father   . Healthy Sister   . Kidney cancer Neg Hx   . Bladder Cancer Neg Hx     Social History   Tobacco Use  . Smoking status: Never Smoker  . Smokeless tobacco: Never Used  . Tobacco comment: smoking cessation materials not required  Substance Use Topics  . Alcohol use: No    Alcohol/week: 0.0 standard drinks     Current Outpatient Medications:  .  acetaminophen (TYLENOL) 650 MG CR tablet, Take 1 tablet daily as needed by mouth., Disp: , Rfl:  .  alendronate (FOSAMAX) 70 MG tablet, TAKE 1 TABLET WEEKLY, Disp: 12 tablet, Rfl: 2 .  aspirin EC 81 MG tablet, Take 81 mg by mouth every morning., Disp: , Rfl:  .   Cholecalciferol (VITAMIN D3) 1000 UNITS CAPS, Take 1,000 Units by mouth every morning. , Disp: , Rfl:  .  EPINEPHrine (EPI-PEN) 0.3 mg/0.3 mL DEVI, Inject 0.3 mg into the muscle as needed. , Disp: , Rfl:  .  ezetimibe (ZETIA) 10 MG tablet, Take 1 tablet (10 mg total) by mouth daily., Disp: 90 tablet, Rfl: 3 .  fluticasone (FLONASE) 50 MCG/ACT nasal spray, USE TWO SPRAY(S) IN EACH NOSTRIL AT BEDTIME, Disp: 48 g, Rfl: 1 .  Multiple Vitamins-Minerals (CENTRUM SILVER PO), Take 1 tablet by mouth every morning. , Disp: , Rfl:  .  nitroGLYCERIN (NITROSTAT) 0.4 MG SL tablet, Place 1 tablet (0.4 mg total) under the tongue every 5 (five) minutes as needed for chest pain., Disp: 25 tablet, Rfl: 0 .  Olmesartan-amLODIPine-HCTZ 40-10-12.5 MG TABS, Take 1 tablet by mouth daily., Disp: 90 tablet, Rfl: 3 .  icosapent Ethyl (VASCEPA) 1 g capsule, Take 2 capsules (2 g total) by  mouth 2 (two) times daily., Disp: 360 capsule, Rfl: 1 .  omeprazole (PRILOSEC) 20 MG capsule, Take 1 capsule (20 mg total) by mouth daily., Disp: 90 capsule, Rfl: 1 .  traZODone (DESYREL) 50 MG tablet, Take 1 tablet (50 mg total) by mouth at bedtime as needed for sleep., Disp: 90 tablet, Rfl: 1  Allergies  Allergen Reactions  . Niaspan [Niacin Er] Other (See Comments)    Patient states she can't remember the reaction because it so long ago.  . Oxycodone-Acetaminophen Nausea Only  . Statins Other (See Comments)    Muscle and joint pain.    I personally reviewed active problem list, medication list, allergies, family history, social history, health maintenance with the patient/caregiver today.   ROS  Constitutional: Negative for fever or weight change.  Respiratory: Negative for cough and shortness of breath.   Cardiovascular: Negative for chest pain or palpitations.  Gastrointestinal: Negative for abdominal pain, no bowel changes.  Musculoskeletal: positive  for gait problem and intermittent right  joint swelling.  Skin: Negative  for rash.  Neurological: Negative for dizziness or headache.  No other specific complaints in a complete review of systems (except as listed in HPI above).  Objective  Vitals:   10/15/20 0840  BP: 126/74  Pulse: 85  Resp: 16  Temp: 97.9 F (36.6 C)  TempSrc: Oral  SpO2: 99%  Weight: 182 lb 9.6 oz (82.8 kg)  Height: 5\' 3"  (1.6 m)    Body mass index is 32.35 kg/m.  Physical Exam  Constitutional: Patient appears well-developed and well-nourished. Obese  No distress.  HEENT: head atraumatic, normocephalic, pupils equal and reactive to light, neck supple Cardiovascular: Normal rate, regular rhythm and normal heart sounds.  No murmur heard. No BLE edema. Pulmonary/Chest: Effort normal and breath sounds normal. No respiratory distress. Abdominal: Soft.  There is no tenderness. Muscular skeletal: no effusion on knee, moving around the room, from sitting to stand ( not feeling well) pain during palpation of lumbar spine, negative straight leg raise  Psychiatric: Patient has a normal mood and affect. behavior is normal. Judgment and thought content normal.   PHQ2/9: Depression screen [facility] 2/9 10/15/2020 05/02/2020 04/15/2020 10/16/2019 05/02/2019  Decreased Interest 0 0 0 0 0  Down, Depressed, Hopeless 0 0 0 0 0  PHQ - 2 Score 0 0 0 0 0  Altered sleeping - - 0 0 0  Tired, decreased energy - - 0 0 0  Change in appetite - - 0 0 0  Feeling bad or failure about yourself  - - 0 0 0  Trouble concentrating - - 0 0 0  Moving slowly or fidgety/restless - - 0 0 0  Suicidal thoughts - - 0 0 0  PHQ-9 Score - - 0 0 0  Difficult doing work/chores - - - - Not difficult at all    phq 9 is negative   Fall Risk: Fall Risk  10/15/2020 05/02/2020 04/15/2020 10/16/2019 05/02/2019  Falls in the past year? 0 0 - 0 0  Number falls in past yr: 0 0 0 0 0  Injury with Fall? 0 0 0 0 0  Risk for fall due to : - No Fall Risks - - -  Follow up - Falls prevention discussed - - Falls prevention discussed      Functional Status Survey: Is the patient deaf or have difficulty hearing?: No Does the patient have difficulty seeing, even when wearing glasses/contacts?: No Does the patient have difficulty concentrating, remembering, or making  decisions?: No Does the patient have difficulty walking or climbing stairs?: No Does the patient have difficulty dressing or bathing?: No Does the patient have difficulty doing errands alone such as visiting a doctor's office or shopping?: No    Assessment & Plan  1. Insomnia, unspecified type  - traZODone (DESYREL) 50 MG tablet; Take 1 tablet (50 mg total) by mouth at bedtime as needed for sleep.  Dispense: 90 tablet; Refill: 1  2. Gastroesophageal reflux disease without esophagitis  - omeprazole (PRILOSEC) 20 MG capsule; Take 1 capsule (20 mg total) by mouth daily.  Dispense: 90 capsule; Refill: 1  3. Osteopenia after menopause   4. Aortic atherosclerosis (HCC)  Not on statin due to myopathy   5. Obstructive sleep apnea on CPAP  Compliant   6. Dyslipidemia   7. Angina pectoris (HCC)  - icosapent Ethyl (VASCEPA) 1 g capsule; Take 2 capsules (2 g total) by mouth 2 (two) times daily.  Dispense: 360 capsule; Refill: 1  8. Atherosclerosis of native coronary artery of native heart with stable angina pectoris (HCC)  - icosapent Ethyl (VASCEPA) 1 g capsule; Take 2 capsules (2 g total) by mouth 2 (two) times daily.  Dispense: 360 capsule; Refill: 1  9. DDD (degenerative disc disease), lumbosacral   10. Hyperglycemia   11. Anemia of chronic disease   12. Benign hypertension with chronic kidney disease, stage III (HCC)   13. Myalgia due to statin  - icosapent Ethyl (VASCEPA) 1 g capsule; Take 2 capsules (2 g total) by mouth 2 (two) times daily.  Dispense: 360 capsule; Refill: 1.

## 2020-10-18 DIAGNOSIS — M85852 Other specified disorders of bone density and structure, left thigh: Secondary | ICD-10-CM | POA: Diagnosis not present

## 2020-10-18 DIAGNOSIS — M8580 Other specified disorders of bone density and structure, unspecified site: Secondary | ICD-10-CM | POA: Diagnosis not present

## 2020-10-18 DIAGNOSIS — M810 Age-related osteoporosis without current pathological fracture: Secondary | ICD-10-CM | POA: Diagnosis not present

## 2020-10-18 DIAGNOSIS — Z780 Asymptomatic menopausal state: Secondary | ICD-10-CM | POA: Diagnosis not present

## 2020-10-18 DIAGNOSIS — Z1231 Encounter for screening mammogram for malignant neoplasm of breast: Secondary | ICD-10-CM | POA: Diagnosis not present

## 2020-10-23 ENCOUNTER — Other Ambulatory Visit: Payer: Self-pay | Admitting: Neurosurgery

## 2020-10-23 DIAGNOSIS — Z981 Arthrodesis status: Secondary | ICD-10-CM

## 2020-11-04 ENCOUNTER — Other Ambulatory Visit: Payer: Self-pay | Admitting: Neurosurgery

## 2020-11-05 ENCOUNTER — Other Ambulatory Visit: Payer: Self-pay

## 2020-11-05 ENCOUNTER — Ambulatory Visit
Admission: RE | Admit: 2020-11-05 | Discharge: 2020-11-05 | Disposition: A | Discharge status: Home / Self Care | Payer: Medicare Other | Source: Ambulatory Visit | Attending: Neurosurgery | Admitting: Neurosurgery

## 2020-11-05 DIAGNOSIS — M545 Low back pain, unspecified: Secondary | ICD-10-CM | POA: Diagnosis not present

## 2020-11-05 DIAGNOSIS — Z981 Arthrodesis status: Secondary | ICD-10-CM | POA: Diagnosis not present

## 2020-11-19 ENCOUNTER — Inpatient Hospital Stay: Admission: RE | Admit: 2020-11-19 | Payer: Medicare PPO | Source: Ambulatory Visit

## 2020-11-22 ENCOUNTER — Other Ambulatory Visit: Payer: Medicare PPO

## 2020-12-09 ENCOUNTER — Other Ambulatory Visit: Payer: BC Managed Care – PPO

## 2020-12-09 ENCOUNTER — Other Ambulatory Visit: Payer: Self-pay

## 2020-12-09 ENCOUNTER — Encounter
Admission: RE | Admit: 2020-12-09 | Discharge: 2020-12-09 | Disposition: A | Discharge status: Home / Self Care | Payer: Medicare Other | Source: Ambulatory Visit | Attending: Neurosurgery | Admitting: Neurosurgery

## 2020-12-09 DIAGNOSIS — Z01812 Encounter for preprocedural laboratory examination: Secondary | ICD-10-CM | POA: Diagnosis not present

## 2020-12-09 DIAGNOSIS — A4902 Methicillin resistant Staphylococcus aureus infection, unspecified site: Secondary | ICD-10-CM | POA: Insufficient documentation

## 2020-12-09 LAB — PROTIME-INR
INR: 1.1 (ref 0.8–1.2)
Prothrombin Time: 13.4 seconds (ref 11.4–15.2)

## 2020-12-09 LAB — URINALYSIS, ROUTINE W REFLEX MICROSCOPIC
Bilirubin Urine: NEGATIVE
Glucose, UA: NEGATIVE mg/dL
Hgb urine dipstick: NEGATIVE
Ketones, ur: NEGATIVE mg/dL
Leukocytes,Ua: NEGATIVE
Nitrite: NEGATIVE
Protein, ur: NEGATIVE mg/dL
Specific Gravity, Urine: 1.01 (ref 1.005–1.030)
pH: 5 (ref 5.0–8.0)

## 2020-12-09 LAB — CBC
HCT: 34.3 % — ABNORMAL LOW (ref 36.0–46.0)
Hemoglobin: 11.6 g/dL — ABNORMAL LOW (ref 12.0–15.0)
MCH: 28.2 pg (ref 26.0–34.0)
MCHC: 33.8 g/dL (ref 30.0–36.0)
MCV: 83.5 fL (ref 80.0–100.0)
Platelets: 295 10*3/uL (ref 150–400)
RBC: 4.11 MIL/uL (ref 3.87–5.11)
RDW: 12.8 % (ref 11.5–15.5)
WBC: 8.7 10*3/uL (ref 4.0–10.5)
nRBC: 0 % (ref 0.0–0.2)

## 2020-12-09 LAB — BASIC METABOLIC PANEL
Anion gap: 10 (ref 5–15)
BUN: 17 mg/dL (ref 8–23)
CO2: 29 mmol/L (ref 22–32)
Calcium: 9.4 mg/dL (ref 8.9–10.3)
Chloride: 104 mmol/L (ref 98–111)
Creatinine, Ser: 1.09 mg/dL — ABNORMAL HIGH (ref 0.44–1.00)
GFR, Estimated: 53 mL/min — ABNORMAL LOW (ref >60)
Glucose, Bld: 112 mg/dL — ABNORMAL HIGH (ref 70–99)
Potassium: 3.4 mmol/L — ABNORMAL LOW (ref 3.5–5.1)
Sodium: 143 mmol/L (ref 135–145)

## 2020-12-09 LAB — SURGICAL PCR SCREEN
MRSA, PCR: POSITIVE — AB
Staphylococcus aureus: POSITIVE — AB

## 2020-12-09 LAB — TYPE AND SCREEN
ABO/RH(D): O POS
Antibody Screen: NEGATIVE

## 2020-12-09 LAB — APTT: aPTT: 32 seconds (ref 24–36)

## 2020-12-09 NOTE — Progress Notes (Signed)
  [facility] Perioperative Services: Pre-Admission/Anesthesia Testing  Abnormal Lab Notification  Date: 12/09/20  Name: JASHAWNA REEVER MRN:   [id number]  Re: Abnormal labs noted during PAT appointment  Provider Notified: Lucy Chris, MD Notification mode: Routed and/or faxed via [facility]  Labs of concern: Lab Results  Component Value Date   STAPHAUREUS POSITIVE (A) 12/09/2020   MRSAPCR POSITIVE (A) 12/09/2020    Notes: Patient is scheduled for a L2/3, L3/4 LATERAL LUMBAR FUSION, L2-4 PEDICLE SCREW FIXATION (N/A ) on 12/17/2019.   This is a Personal assistant; no formal response required.   Quentin Mulling, MSN, APRN, FNP-C, CEN [facility]  Peri-operative Services Nurse Practitioner Phone: [phone] 12/09/20 11:32 AM

## 2020-12-09 NOTE — Patient Instructions (Addendum)
Your procedure is scheduled on: December 16, 2020 MONDAY Report to the Registration Desk on the 1st floor of the [facility] To find out your arrival time, please call [phone] between 1PM - 3PM on: Friday December 13, 2020  REMEMBER: Instructions that are not followed completely may result in serious medical risk, up to and including death; or upon the discretion of your surgeon and anesthesiologist your surgery may need to be rescheduled.  Do not eat food after midnight the night before surgery.  No gum chewing, lozengers or hard candies.  You may however, drink CLEAR liquids up to 2 hours before you are scheduled to arrive for your surgery. Do not drink anything within 2 hours of your scheduled arrival time.  Clear liquids include: - water  - apple juice without pulp - gatorade (not RED, PURPLE, OR BLUE) - black coffee or tea (Do NOT add milk or creamers to the coffee or tea) Do NOT drink anything that is not on this list.  Type 1 and Type 2 diabetics should only drink water.    TAKE THESE MEDICATIONS THE MORNING OF SURGERY WITH A SIP OF WATER: ZETIA OMEPRAZOLE (take one the night before and one on the morning of surgery - helps to prevent nausea after surgery.)  DO NOT TAKE BLOOD PRESSURE MED MORNING OF SURGERY -OLMESARTAN-AMLODIPINE-HCTZ  DO NOT TAKE ASPIRIN FOR ONE WEEK BEFORE AND AFTER SURGERY. LAST DOSE December 08, 2020  One week prior to surgery: Stop Anti-inflammatories (NSAIDS) such as Advil, Aleve, Ibuprofen, Motrin, Naproxen, Naprosyn and Aspirin based products such as Excedrin, Goodys Powder, BC Powder.                          TAKE ONLY TYLENOL IF NEEDED Stop ANY OVER THE COUNTER supplements until after surgery. (However, you may continue taking Vitamin D, Vitamin B, and multivitamin up until the day before surgery.)  No Alcohol for 24 hours before or after surgery.  No Smoking including e-cigarettes for 24 hours prior to surgery.  No chewable tobacco  products for at least 6 hours prior to surgery.  No nicotine patches on the day of surgery.  Do not use any "recreational" drugs for at least a week prior to your surgery.  Please be advised that the combination of cocaine and anesthesia may have negative outcomes, up to and including death. If you test positive for cocaine, your surgery will be cancelled.  On the morning of surgery brush your teeth with toothpaste and water, you may rinse your mouth with mouthwash if you wish. Do not swallow any toothpaste or mouthwash.  Do not wear jewelry, make-up, hairpins, clips or nail polish.  Do not wear lotions, powders, or perfumes OR DEODORANT  Do not shave body from the neck down 48 hours prior to surgery just in case you cut yourself which could leave a site for infection.  Also, freshly shaved skin may become irritated if using the CHG soap.  Contact lenses, hearing aids and dentures may not be worn into surgery.  Do not bring valuables to the hospital. [facility] is not responsible for any missing/lost belongings or valuables.   Use CHG Soap  as directed on instruction sheet.  Bring your C-PAP to the hospital with you in case you may have to spend the night.   Notify your doctor if there is any change in your medical condition (cold, fever, infection).  Wear comfortable clothing (specific to your surgery type)  to the hospital.  Plan for stool softeners for home use; pain medications have a tendency to cause constipation. You can also help prevent constipation by eating foods high in fiber such as fruits and vegetables and drinking plenty of fluids as your diet allows.  After surgery, you can help prevent lung complications by doing breathing exercises.  Take deep breaths and cough every 1-2 hours. Your doctor may order a device called an Incentive Spirometer to help you take deep breaths. When coughing or sneezing, hold a pillow firmly against your incision with both hands. This is  called "splinting." Doing this helps protect your incision. It also decreases belly discomfort.  If you are being admitted to the hospital overnight, YOU MAY BRING A SMALL BAG WITH YOU.   If you are being discharged the day of surgery, you will not be allowed to drive home. You will need a responsible adult (18 years or older) to drive you home and stay with you that night.    Please call the Pre-admissions Testing Dept. at [phone] if you have any questions about these instructions.  Visitation Policy:  Patients undergoing a surgery or procedure may have one family member or support person with them as long as that person is not COVID-19 positive or experiencing its symptoms.  That person may remain in the waiting area during the procedure.  Inpatient Visitation:    Visiting hours are 7 a.m. to 8 p.m. Patients will be allowed one visitor. The visitor may change daily. The visitor must pass COVID-19 screenings, use hand sanitizer when entering and exiting the patient's room and wear a mask at all times, including in the patient's room. Patients must also wear a mask when staff or their visitor are in the room. Masking is required regardless of vaccination status. Systemwide, no visitors 17 or younger.

## 2020-12-10 NOTE — Progress Notes (Signed)
  [facility] Perioperative Services: Pre-Admission/Anesthesia Testing     Date: 12/10/20  Name: Jodi Kirby MRN:   [id number]  Re: Request for additional preoperative prophylaxis   Case: [id number] Date/Time: 12/16/20 1148   Procedure: L2/3, L3/4 LATERAL LUMBAR FUSION, L2-4 PEDICLE SCREW FIXATION (N/A )   Anesthesia type: General   Pre-op diagnosis: lumbar stenosis, lumbar radiculopathy   Location: [facility] OR ROOM 03 / [facility] ORS FOR ANESTHESIA GROUP   Surgeons: Lucy Chris, MD    Patient is scheduled for the above procedure on 12/16/2020. Received communication from primary attending surgeons office requesting that preoperative vancomycin also be added to patient's presurgical antimicrobial prophylactic regimen.  Per clinical RN Carney Bern), Dr. Adriana Simas requesting that vancomycin per pharmacy consult be added to preoperative orders.  Order entered into [facility] by PAT APP per physician request.  Patient will be receiving vancomycin + cefazolin prior to her lumbar fusion in the setting of known PCR (+) MRSA swab.  Quentin Mulling, MSN, APRN, FNP-C, CEN [facility]  Peri-operative Services Nurse Practitioner Phone: [phone] 12/10/20 10:32 AM

## 2020-12-13 ENCOUNTER — Other Ambulatory Visit: Payer: Self-pay

## 2020-12-13 ENCOUNTER — Other Ambulatory Visit
Admission: RE | Admit: 2020-12-13 | Discharge: 2020-12-13 | Disposition: A | Discharge status: Home / Self Care | Payer: Medicare Other | Source: Ambulatory Visit | Attending: Neurosurgery | Admitting: Neurosurgery

## 2020-12-13 DIAGNOSIS — Z841 Family history of disorders of kidney and ureter: Secondary | ICD-10-CM | POA: Diagnosis not present

## 2020-12-13 DIAGNOSIS — Z7983 Long term (current) use of bisphosphonates: Secondary | ICD-10-CM | POA: Diagnosis not present

## 2020-12-13 DIAGNOSIS — K219 Gastro-esophageal reflux disease without esophagitis: Secondary | ICD-10-CM | POA: Diagnosis not present

## 2020-12-13 DIAGNOSIS — Z981 Arthrodesis status: Secondary | ICD-10-CM | POA: Diagnosis not present

## 2020-12-13 DIAGNOSIS — M5416 Radiculopathy, lumbar region: Secondary | ICD-10-CM | POA: Diagnosis not present

## 2020-12-13 DIAGNOSIS — I10 Essential (primary) hypertension: Secondary | ICD-10-CM | POA: Diagnosis not present

## 2020-12-13 DIAGNOSIS — Z01812 Encounter for preprocedural laboratory examination: Secondary | ICD-10-CM | POA: Insufficient documentation

## 2020-12-13 DIAGNOSIS — Z79899 Other long term (current) drug therapy: Secondary | ICD-10-CM | POA: Diagnosis not present

## 2020-12-13 DIAGNOSIS — Z96651 Presence of right artificial knee joint: Secondary | ICD-10-CM | POA: Diagnosis not present

## 2020-12-13 DIAGNOSIS — Z20822 Contact with and (suspected) exposure to covid-19: Secondary | ICD-10-CM | POA: Insufficient documentation

## 2020-12-13 DIAGNOSIS — M48061 Spinal stenosis, lumbar region without neurogenic claudication: Secondary | ICD-10-CM | POA: Diagnosis not present

## 2020-12-13 DIAGNOSIS — Z7982 Long term (current) use of aspirin: Secondary | ICD-10-CM | POA: Diagnosis not present

## 2020-12-13 DIAGNOSIS — Z8249 Family history of ischemic heart disease and other diseases of the circulatory system: Secondary | ICD-10-CM | POA: Diagnosis not present

## 2020-12-13 LAB — SARS CORONAVIRUS 2 (TAT 6-24 HRS): SARS Coronavirus 2: NEGATIVE

## 2020-12-16 ENCOUNTER — Encounter: Payer: Self-pay | Admitting: Neurosurgery

## 2020-12-16 ENCOUNTER — Encounter
Admission: RE | Disposition: A | Discharge status: Home / Self Care | Payer: Self-pay | Source: Home / Self Care | Attending: Neurosurgery

## 2020-12-16 ENCOUNTER — Inpatient Hospital Stay: Payer: Medicare Other

## 2020-12-16 ENCOUNTER — Inpatient Hospital Stay
Admission: RE | Admit: 2020-12-16 | Discharge: 2020-12-18 | DRG: 460 | Disposition: A | Discharge status: Home / Self Care | Payer: Medicare Other | Attending: Neurosurgery | Admitting: Neurosurgery

## 2020-12-16 ENCOUNTER — Inpatient Hospital Stay: Payer: Medicare Other | Admitting: Urgent Care

## 2020-12-16 DIAGNOSIS — M5416 Radiculopathy, lumbar region: Secondary | ICD-10-CM | POA: Diagnosis present

## 2020-12-16 DIAGNOSIS — M47817 Spondylosis without myelopathy or radiculopathy, lumbosacral region: Secondary | ICD-10-CM | POA: Diagnosis not present

## 2020-12-16 DIAGNOSIS — Z8249 Family history of ischemic heart disease and other diseases of the circulatory system: Secondary | ICD-10-CM | POA: Diagnosis not present

## 2020-12-16 DIAGNOSIS — Z841 Family history of disorders of kidney and ureter: Secondary | ICD-10-CM | POA: Diagnosis not present

## 2020-12-16 DIAGNOSIS — Z20822 Contact with and (suspected) exposure to covid-19: Secondary | ICD-10-CM | POA: Diagnosis present

## 2020-12-16 DIAGNOSIS — Z9889 Other specified postprocedural states: Secondary | ICD-10-CM | POA: Diagnosis not present

## 2020-12-16 DIAGNOSIS — I129 Hypertensive chronic kidney disease with stage 1 through stage 4 chronic kidney disease, or unspecified chronic kidney disease: Secondary | ICD-10-CM | POA: Diagnosis not present

## 2020-12-16 DIAGNOSIS — M48061 Spinal stenosis, lumbar region without neurogenic claudication: Principal | ICD-10-CM | POA: Diagnosis present

## 2020-12-16 DIAGNOSIS — Z7982 Long term (current) use of aspirin: Secondary | ICD-10-CM

## 2020-12-16 DIAGNOSIS — M47816 Spondylosis without myelopathy or radiculopathy, lumbar region: Secondary | ICD-10-CM | POA: Diagnosis not present

## 2020-12-16 DIAGNOSIS — Z7983 Long term (current) use of bisphosphonates: Secondary | ICD-10-CM

## 2020-12-16 DIAGNOSIS — I2510 Atherosclerotic heart disease of native coronary artery without angina pectoris: Secondary | ICD-10-CM | POA: Diagnosis not present

## 2020-12-16 DIAGNOSIS — Z981 Arthrodesis status: Secondary | ICD-10-CM

## 2020-12-16 DIAGNOSIS — Z96651 Presence of right artificial knee joint: Secondary | ICD-10-CM | POA: Diagnosis present

## 2020-12-16 DIAGNOSIS — K219 Gastro-esophageal reflux disease without esophagitis: Secondary | ICD-10-CM | POA: Diagnosis not present

## 2020-12-16 DIAGNOSIS — N183 Chronic kidney disease, stage 3 unspecified: Secondary | ICD-10-CM | POA: Diagnosis not present

## 2020-12-16 DIAGNOSIS — I10 Essential (primary) hypertension: Secondary | ICD-10-CM | POA: Diagnosis present

## 2020-12-16 DIAGNOSIS — Z79899 Other long term (current) drug therapy: Secondary | ICD-10-CM | POA: Diagnosis not present

## 2020-12-16 DIAGNOSIS — E785 Hyperlipidemia, unspecified: Secondary | ICD-10-CM | POA: Diagnosis not present

## 2020-12-16 DIAGNOSIS — M48062 Spinal stenosis, lumbar region with neurogenic claudication: Secondary | ICD-10-CM | POA: Diagnosis not present

## 2020-12-16 DIAGNOSIS — Z419 Encounter for procedure for purposes other than remedying health state, unspecified: Secondary | ICD-10-CM

## 2020-12-16 DIAGNOSIS — M4326 Fusion of spine, lumbar region: Secondary | ICD-10-CM | POA: Diagnosis not present

## 2020-12-16 HISTORY — PX: ANTERIOR LATERAL LUMBAR FUSION WITH PERCUTANEOUS SCREW 2 LEVEL: SHX5554

## 2020-12-16 SURGERY — ANTERIOR LATERAL LUMBAR FUSION WITH PERCUTANEOUS SCREW 2 LEVEL
Anesthesia: General | Site: Back

## 2020-12-16 MED ORDER — EZETIMIBE 10 MG PO TABS
10.0000 mg | ORAL_TABLET | Freq: Every day | ORAL | Status: DC
  Administered 2020-12-17 – 2020-12-18 (×2): 10 mg via ORAL
  Filled 2020-12-16 (×2): qty 1

## 2020-12-16 MED ORDER — PANTOPRAZOLE SODIUM 40 MG PO TBEC
40.0000 mg | DELAYED_RELEASE_TABLET | Freq: Every day | ORAL | Status: DC
  Administered 2020-12-17 – 2020-12-18 (×2): 40 mg via ORAL
  Filled 2020-12-16 (×2): qty 1

## 2020-12-16 MED ORDER — HYDROMORPHONE HCL 1 MG/ML IJ SOLN
0.5000 mg | INTRAMUSCULAR | Status: DC | PRN
  Administered 2020-12-16 – 2020-12-17 (×5): 0.5 mg via INTRAVENOUS
  Filled 2020-12-16 (×5): qty 1

## 2020-12-16 MED ORDER — BUPIVACAINE-EPINEPHRINE 0.5% -1:200000 IJ SOLN
INTRAMUSCULAR | Status: DC | PRN
  Administered 2020-12-16: 30 mL
  Administered 2020-12-16: 13 mL

## 2020-12-16 MED ORDER — LIDOCAINE HCL (PF) 2 % IJ SOLN
INTRAMUSCULAR | Status: AC
  Filled 2020-12-16: qty 5

## 2020-12-16 MED ORDER — ORAL CARE MOUTH RINSE: 15.0000 mL | Freq: Once | OROMUCOSAL | Status: AC

## 2020-12-16 MED ORDER — ONDANSETRON HCL 4 MG PO TABS
4.0000 mg | ORAL_TABLET | Freq: Four times a day (QID) | ORAL | Status: DC | PRN
  Administered 2020-12-17: 4 mg via ORAL
  Filled 2020-12-16: qty 1

## 2020-12-16 MED ORDER — LACTATED RINGERS IV SOLN: INTRAVENOUS | Status: DC

## 2020-12-16 MED ORDER — SUCCINYLCHOLINE CHLORIDE 20 MG/ML IJ SOLN
INTRAMUSCULAR | Status: DC | PRN
  Administered 2020-12-16: 100 mg via INTRAVENOUS

## 2020-12-16 MED ORDER — ENOXAPARIN SODIUM 40 MG/0.4ML ~~LOC~~ SOLN
40.0000 mg | SUBCUTANEOUS | Status: DC
  Administered 2020-12-17 – 2020-12-18 (×2): 40 mg via SUBCUTANEOUS
  Filled 2020-12-16 (×2): qty 0.4

## 2020-12-16 MED ORDER — FLUTICASONE PROPIONATE 50 MCG/ACT NA SUSP
2.0000 | Freq: Every day | NASAL | Status: DC
  Administered 2020-12-17: 2 via NASAL
  Filled 2020-12-16: qty 16

## 2020-12-16 MED ORDER — PHENYLEPHRINE HCL (PRESSORS) 10 MG/ML IV SOLN
INTRAVENOUS | Status: DC | PRN
  Administered 2020-12-16 (×4): 100 ug via INTRAVENOUS

## 2020-12-16 MED ORDER — SODIUM CHLORIDE 0.9 % IV SOLN: INTRAVENOUS | Status: DC

## 2020-12-16 MED ORDER — METHOCARBAMOL 500 MG PO TABS
500.0000 mg | ORAL_TABLET | Freq: Four times a day (QID) | ORAL | Status: DC | PRN
  Administered 2020-12-17: 500 mg via ORAL
  Filled 2020-12-16: qty 1

## 2020-12-16 MED ORDER — MEPERIDINE HCL 50 MG/ML IJ SOLN: 6.2500 mg | INTRAMUSCULAR | Status: DC | PRN

## 2020-12-16 MED ORDER — PROPOFOL 10 MG/ML IV BOLUS
INTRAVENOUS | Status: DC | PRN
  Administered 2020-12-16: 130 mg via INTRAVENOUS

## 2020-12-16 MED ORDER — DROPERIDOL 2.5 MG/ML IJ SOLN
0.6250 mg | Freq: Once | INTRAMUSCULAR | Status: DC | PRN
  Filled 2020-12-16: qty 2

## 2020-12-16 MED ORDER — SODIUM CHLORIDE 0.9 % IV SOLN: 250.0000 mL | INTRAVENOUS | Status: DC

## 2020-12-16 MED ORDER — PHENOL 1.4 % MT LIQD
1.0000 | OROMUCOSAL | Status: DC | PRN
  Filled 2020-12-16: qty 177

## 2020-12-16 MED ORDER — AMLODIPINE BESYLATE 10 MG PO TABS
10.0000 mg | ORAL_TABLET | Freq: Every day | ORAL | Status: DC
  Administered 2020-12-17 – 2020-12-18 (×2): 10 mg via ORAL
  Filled 2020-12-16 (×2): qty 1

## 2020-12-16 MED ORDER — VANCOMYCIN HCL 1250 MG/250ML IV SOLN
1250.0000 mg | Freq: Once | INTRAVENOUS | Status: AC
  Administered 2020-12-16: 1250 mg via INTRAVENOUS
  Filled 2020-12-16: qty 250

## 2020-12-16 MED ORDER — ACETAMINOPHEN ER 650 MG PO TBCR: 650.0000 mg | EXTENDED_RELEASE_TABLET | Freq: Four times a day (QID) | ORAL | Status: DC

## 2020-12-16 MED ORDER — ACETAMINOPHEN 325 MG PO TABS: 650.0000 mg | ORAL_TABLET | ORAL | Status: DC | PRN

## 2020-12-16 MED ORDER — FENTANYL CITRATE (PF) 100 MCG/2ML IJ SOLN
INTRAMUSCULAR | Status: DC | PRN
  Administered 2020-12-16: 75 ug via INTRAVENOUS
  Administered 2020-12-16: 25 ug via INTRAVENOUS

## 2020-12-16 MED ORDER — REMIFENTANIL HCL 1 MG IV SOLR
INTRAVENOUS | Status: AC
  Filled 2020-12-16: qty 1000

## 2020-12-16 MED ORDER — OXYCODONE HCL 5 MG/5ML PO SOLN: 5.0000 mg | Freq: Once | ORAL | Status: DC | PRN

## 2020-12-16 MED ORDER — MENTHOL 3 MG MT LOZG
1.0000 | LOZENGE | OROMUCOSAL | Status: DC | PRN
  Filled 2020-12-16: qty 9

## 2020-12-16 MED ORDER — SEVOFLURANE IN SOLN
RESPIRATORY_TRACT | Status: AC
  Filled 2020-12-16: qty 250

## 2020-12-16 MED ORDER — LIDOCAINE HCL (CARDIAC) PF 100 MG/5ML IV SOSY
PREFILLED_SYRINGE | INTRAVENOUS | Status: DC | PRN
  Administered 2020-12-16: 80 mg via INTRAVENOUS

## 2020-12-16 MED ORDER — CHLORHEXIDINE GLUCONATE 0.12 % MT SOLN
OROMUCOSAL | Status: AC
  Filled 2020-12-16: qty 15

## 2020-12-16 MED ORDER — VITAMIN D 25 MCG (1000 UNIT) PO TABS
1000.0000 [IU] | ORAL_TABLET | ORAL | Status: DC
  Administered 2020-12-17 – 2020-12-18 (×2): 1000 [IU] via ORAL
  Filled 2020-12-16 (×2): qty 1

## 2020-12-16 MED ORDER — FENTANYL CITRATE (PF) 100 MCG/2ML IJ SOLN
INTRAMUSCULAR | Status: AC
  Filled 2020-12-16: qty 2

## 2020-12-16 MED ORDER — EPHEDRINE SULFATE 50 MG/ML IJ SOLN
INTRAMUSCULAR | Status: DC | PRN
  Administered 2020-12-16: 5 mg via INTRAVENOUS
  Administered 2020-12-16 (×2): 10 mg via INTRAVENOUS
  Administered 2020-12-16: 5 mg via INTRAVENOUS

## 2020-12-16 MED ORDER — REMIFENTANIL HCL 1 MG IV SOLR
INTRAVENOUS | Status: DC | PRN
  Administered 2020-12-16: .06 ug/kg/min via INTRAVENOUS

## 2020-12-16 MED ORDER — SODIUM CHLORIDE 0.9% FLUSH
3.0000 mL | INTRAVENOUS | Status: DC | PRN
Start: 2020-12-16 — End: 2020-12-18

## 2020-12-16 MED ORDER — HYDROMORPHONE HCL 1 MG/ML IJ SOLN
INTRAMUSCULAR | Status: AC
  Administered 2020-12-16: 0.5 mg via INTRAVENOUS
  Filled 2020-12-16: qty 1

## 2020-12-16 MED ORDER — ACETAMINOPHEN 650 MG RE SUPP: 650.0000 mg | RECTAL | Status: DC | PRN

## 2020-12-16 MED ORDER — ASCORBIC ACID 500 MG PO TABS
1000.0000 mg | ORAL_TABLET | Freq: Every day | ORAL | Status: DC
  Administered 2020-12-17 – 2020-12-18 (×2): 1000 mg via ORAL
  Filled 2020-12-16 (×2): qty 2

## 2020-12-16 MED ORDER — CEFAZOLIN SODIUM-DEXTROSE 2-4 GM/100ML-% IV SOLN
INTRAVENOUS | Status: AC
  Filled 2020-12-16: qty 100

## 2020-12-16 MED ORDER — ONDANSETRON HCL 4 MG/2ML IJ SOLN
INTRAMUSCULAR | Status: AC
  Filled 2020-12-16: qty 2

## 2020-12-16 MED ORDER — LOSARTAN POTASSIUM 50 MG PO TABS
100.0000 mg | ORAL_TABLET | Freq: Every day | ORAL | Status: DC
  Administered 2020-12-17 – 2020-12-18 (×2): 100 mg via ORAL
  Filled 2020-12-16 (×2): qty 2

## 2020-12-16 MED ORDER — DEXAMETHASONE SODIUM PHOSPHATE 10 MG/ML IJ SOLN
INTRAMUSCULAR | Status: AC
  Filled 2020-12-16: qty 1

## 2020-12-16 MED ORDER — SUCCINYLCHOLINE CHLORIDE 200 MG/10ML IV SOSY
PREFILLED_SYRINGE | INTRAVENOUS | Status: AC
  Filled 2020-12-16: qty 10

## 2020-12-16 MED ORDER — HYDROCHLOROTHIAZIDE 12.5 MG PO CAPS
12.5000 mg | ORAL_CAPSULE | Freq: Every day | ORAL | Status: DC
  Administered 2020-12-17 – 2020-12-18 (×2): 12.5 mg via ORAL
  Filled 2020-12-16 (×2): qty 1

## 2020-12-16 MED ORDER — OXYCODONE HCL 5 MG PO TABS
5.0000 mg | ORAL_TABLET | ORAL | Status: DC | PRN
Start: 2020-12-16 — End: 2020-12-18
  Administered 2020-12-18: 10 mg via ORAL
  Filled 2020-12-16: qty 2

## 2020-12-16 MED ORDER — LORAZEPAM 2 MG/ML IJ SOLN: 1.0000 mg | Freq: Once | INTRAMUSCULAR | Status: DC | PRN

## 2020-12-16 MED ORDER — PROMETHAZINE HCL 25 MG/ML IJ SOLN
6.2500 mg | INTRAMUSCULAR | Status: DC | PRN
Start: 2020-12-16 — End: 2020-12-16

## 2020-12-16 MED ORDER — DOCUSATE SODIUM 100 MG PO CAPS
100.0000 mg | ORAL_CAPSULE | Freq: Two times a day (BID) | ORAL | Status: DC
  Administered 2020-12-16 – 2020-12-18 (×4): 100 mg via ORAL
  Filled 2020-12-16 (×4): qty 1

## 2020-12-16 MED ORDER — CEFAZOLIN SODIUM-DEXTROSE 2-4 GM/100ML-% IV SOLN
2.0000 g | Freq: Once | INTRAVENOUS | Status: AC
  Administered 2020-12-16: 2 g via INTRAVENOUS

## 2020-12-16 MED ORDER — METHOCARBAMOL 1000 MG/10ML IJ SOLN
500.0000 mg | Freq: Four times a day (QID) | INTRAVENOUS | Status: DC | PRN
  Filled 2020-12-16: qty 5

## 2020-12-16 MED ORDER — ADULT MULTIVITAMIN W/MINERALS CH
ORAL_TABLET | ORAL | Status: DC
  Administered 2020-12-17 – 2020-12-18 (×2): 1 via ORAL
  Filled 2020-12-16 (×2): qty 1

## 2020-12-16 MED ORDER — PROPOFOL 10 MG/ML IV BOLUS
INTRAVENOUS | Status: AC
  Filled 2020-12-16: qty 20

## 2020-12-16 MED ORDER — CHLORHEXIDINE GLUCONATE 0.12 % MT SOLN
15.0000 mL | Freq: Once | OROMUCOSAL | Status: AC
  Administered 2020-12-16: 15 mL via OROMUCOSAL

## 2020-12-16 MED ORDER — ONDANSETRON HCL 4 MG/2ML IJ SOLN: 4.0000 mg | Freq: Four times a day (QID) | INTRAMUSCULAR | Status: DC | PRN

## 2020-12-16 MED ORDER — DEXAMETHASONE SODIUM PHOSPHATE 10 MG/ML IJ SOLN
INTRAMUSCULAR | Status: DC | PRN
  Administered 2020-12-16: 8 mg via INTRAVENOUS

## 2020-12-16 MED ORDER — TRAZODONE HCL 50 MG PO TABS: 50.0000 mg | ORAL_TABLET | Freq: Every evening | ORAL | Status: DC | PRN

## 2020-12-16 MED ORDER — OXYCODONE HCL 5 MG PO TABS
5.0000 mg | ORAL_TABLET | Freq: Once | ORAL | Status: DC | PRN
Start: 2020-12-16 — End: 2020-12-16

## 2020-12-16 MED ORDER — EPHEDRINE 5 MG/ML INJ
INTRAVENOUS | Status: AC
  Filled 2020-12-16: qty 10

## 2020-12-16 MED ORDER — SODIUM CHLORIDE 0.9% FLUSH
3.0000 mL | Freq: Two times a day (BID) | INTRAVENOUS | Status: DC
  Administered 2020-12-16 – 2020-12-17 (×2): 3 mL via INTRAVENOUS

## 2020-12-16 MED ORDER — HYDROMORPHONE HCL 1 MG/ML IJ SOLN
0.2500 mg | INTRAMUSCULAR | Status: DC | PRN
  Administered 2020-12-16: 0.5 mg via INTRAVENOUS

## 2020-12-16 MED ORDER — OLMESARTAN-AMLODIPINE-HCTZ 40-10-12.5 MG PO TABS: 1.0000 | ORAL_TABLET | Freq: Every day | ORAL | Status: DC

## 2020-12-16 MED ORDER — CEFAZOLIN SODIUM-DEXTROSE 2-4 GM/100ML-% IV SOLN
2.0000 g | Freq: Three times a day (TID) | INTRAVENOUS | Status: AC
  Administered 2020-12-16 – 2020-12-17 (×2): 2 g via INTRAVENOUS
  Filled 2020-12-16 (×3): qty 100

## 2020-12-16 SURGICAL SUPPLY — 88 items
BUR NEURO DRILL SOFT 3.0X3.8M (BURR) ×2 IMPLANT
CAGE MODULUS XL 10X18X50 - 10 (Cage) ×4 IMPLANT
CANISTER SUCT 1200ML W/VALVE (MISCELLANEOUS) ×4 IMPLANT
CHLORAPREP W/TINT 26 (MISCELLANEOUS) ×8 IMPLANT
CORD BIP STRL DISP 12FT (MISCELLANEOUS) ×2 IMPLANT
COUNTER NEEDLE 20/40 LG (NEEDLE) ×2 IMPLANT
COVER BACK TABLE REUSABLE LG (DRAPES) ×2 IMPLANT
COVER LIGHT HANDLE STERIS (MISCELLANEOUS) ×8 IMPLANT
COVER WAND RF STERILE (DRAPES) ×2 IMPLANT
CUP MEDICINE 2OZ PLAST GRAD ST (MISCELLANEOUS) ×2 IMPLANT
DERMABOND ADVANCED (GAUZE/BANDAGES/DRESSINGS) ×1
DERMABOND ADVANCED .7 DNX12 (GAUZE/BANDAGES/DRESSINGS) ×1 IMPLANT
DRAPE C-ARM 42X72 X-RAY (DRAPES) ×10 IMPLANT
DRAPE C-ARMOR (DRAPES) ×6 IMPLANT
DRAPE INCISE IOBAN 66X45 STRL (DRAPES) ×4 IMPLANT
DRAPE LAPAROTOMY 100X77 ABD (DRAPES) ×6 IMPLANT
DRAPE MICROSCOPE SPINE 48X150 (DRAPES) IMPLANT
DRAPE SURG 17X11 SM STRL (DRAPES) ×16 IMPLANT
DRSG OPSITE POSTOP 4X6 (GAUZE/BANDAGES/DRESSINGS) IMPLANT
DRSG TEGADERM 2-3/8X2-3/4 SM (GAUZE/BANDAGES/DRESSINGS) IMPLANT
DRSG TEGADERM 4X4.75 (GAUZE/BANDAGES/DRESSINGS) ×4 IMPLANT
DRSG TEGADERM 6X8 (GAUZE/BANDAGES/DRESSINGS) IMPLANT
DRSG TELFA 3X8 NADH (GAUZE/BANDAGES/DRESSINGS) ×4 IMPLANT
DRSG TELFA 4X3 1S NADH ST (GAUZE/BANDAGES/DRESSINGS) IMPLANT
ELECT CAUTERY BLADE TIP 2.5 (TIP) ×4
ELECT EZSTD 165MM 6.5IN (MISCELLANEOUS) ×2
ELECT REM PT RETURN 9FT ADLT (ELECTROSURGICAL) ×4
ELECTRODE CAUTERY BLDE TIP 2.5 (TIP) ×2 IMPLANT
ELECTRODE EZSTD 165MM 6.5IN (MISCELLANEOUS) ×1 IMPLANT
ELECTRODE REM PT RTRN 9FT ADLT (ELECTROSURGICAL) ×2 IMPLANT
FEE INTRAOP MONITOR IMPULS NCS (MISCELLANEOUS) IMPLANT
FRAME EYE SHIELD (PROTECTIVE WEAR) ×2 IMPLANT
GAUZE 4X4 16PLY RFD (DISPOSABLE) ×2 IMPLANT
GLOVE SRG 8 PF TXTR STRL LF DI (GLOVE) ×4 IMPLANT
GLOVE SURG SYN 7.0 (GLOVE) ×12 IMPLANT
GLOVE SURG SYN 8.0 (GLOVE) ×12 IMPLANT
GLOVE SURG UNDER POLY LF SZ7 (GLOVE) ×4 IMPLANT
GLOVE SURG UNDER POLY LF SZ8 (GLOVE) ×8
GOWN STRL REUS W/ TWL XL LVL3 (GOWN DISPOSABLE) ×2 IMPLANT
GOWN STRL REUS W/TWL XL LVL3 (GOWN DISPOSABLE) ×4
GRADUATE 1200CC STRL 31836 (MISCELLANEOUS) ×2 IMPLANT
GUIDEWIRE NITINOL BEVEL TIP (WIRE) ×8 IMPLANT
HOLDER FOLEY CATH W/STRAP (MISCELLANEOUS) ×2 IMPLANT
INTRAOP MONITOR FEE IMPULS NCS (MISCELLANEOUS)
INTRAOP MONITOR FEE IMPULSE (MISCELLANEOUS)
KIT DILATOR XLIF 5 (KITS) ×2 IMPLANT
KIT MAXCESS (KITS) ×2 IMPLANT
KIT NEEDLE NVM5 EMG ELECT (KITS) ×1 IMPLANT
KIT NEEDLE NVM5 EMG ELECTRODE (KITS) ×1
KIT SPINAL PRONEVIEW (KITS) ×2 IMPLANT
KIT SURGICAL ACCESS MAXCESS 4 (KITS) ×2 IMPLANT
KIT TURNOVER KIT A (KITS) ×2 IMPLANT
KIT XLIF (KITS) ×2
KNIFE BAYONET SHORT DISCETOMY (MISCELLANEOUS) IMPLANT
MANIFOLD NEPTUNE II (INSTRUMENTS) ×2 IMPLANT
MARKER SKIN DUAL TIP RULER LAB (MISCELLANEOUS) ×8 IMPLANT
NDL SAFETY ECLIPSE 18X1.5 (NEEDLE) ×1 IMPLANT
NEEDLE FILTER BLUNT 18X 1/2SAF (NEEDLE) ×1
NEEDLE FILTER BLUNT 18X1 1/2 (NEEDLE) ×1 IMPLANT
NEEDLE HYPO 18GX1.5 SHARP (NEEDLE) ×1
NEEDLE HYPO 22GX1.5 SAFETY (NEEDLE) ×2 IMPLANT
NEEDLE I PASS (NEEDLE) ×4 IMPLANT
PACK LAMINECTOMY NEURO (CUSTOM PROCEDURE TRAY) ×2 IMPLANT
PAD ARMBOARD 7.5X6 YLW CONV (MISCELLANEOUS) ×6 IMPLANT
PENCIL ELECTRO HAND CTR (MISCELLANEOUS) ×2 IMPLANT
PLATE XLIF MODULUS DS 10 (Plate) ×2 IMPLANT
PUTTY DBM PROPEL MEDIUM (Putty) ×2 IMPLANT
PUTTY DBM PROPEL SM (Putty) ×2 IMPLANT
SCREW LOCK RELINE 5.5 TULIP (Screw) ×8 IMPLANT
SCREW RELINE RED 6.5X45MM POLY (Screw) ×8 IMPLANT
SCREW VA COROENT XL 5.5X40 (Screw) ×2 IMPLANT
SCREW XL VAR 5485540 (Screw) ×2 IMPLANT
SPOGE SURGIFLO 8M (HEMOSTASIS) ×2
SPONGE GAUZE 2X2 8PLY STRL LF (GAUZE/BANDAGES/DRESSINGS) ×4 IMPLANT
SPONGE SURGIFLO 8M (HEMOSTASIS) ×1 IMPLANT
STAPLER SKIN PROX 35W (STAPLE) IMPLANT
SUT ETHILON 3-0 FS-10 30 BLK (SUTURE)
SUT POLYSORB 2-0 5X18 GS-10 (SUTURE) ×18 IMPLANT
SUT VIC AB 0 CT1 18XCR BRD 8 (SUTURE) ×1 IMPLANT
SUT VIC AB 0 CT1 27 (SUTURE) ×2
SUT VIC AB 0 CT1 27XCR 8 STRN (SUTURE) ×1 IMPLANT
SUT VIC AB 0 CT1 8-18 (SUTURE) ×2
SUTURE EHLN 3-0 FS-10 30 BLK (SUTURE) IMPLANT
SYR 30ML LL (SYRINGE) ×4 IMPLANT
SYR TB 1ML 27GX1/2 LL (SYRINGE) ×2 IMPLANT
TOWEL OR 17X26 4PK STRL BLUE (TOWEL DISPOSABLE) ×6 IMPLANT
TRAY FOLEY MTR SLVR 16FR STAT (SET/KITS/TRAYS/PACK) ×2 IMPLANT
TUBING CONNECTING 10 (TUBING) ×6 IMPLANT

## 2020-12-16 NOTE — Op Note (Signed)
Operative Note  SURGERY DATE:12/16/2020  PRE-OP DIAGNOSIS: Lumbar Stenosis withLumbar Radiculopathy(m48.062)  POST-OP DIAGNOSIS:Post-Op Diagnosis Codes: Lumbar Stenosis withLumbar Radiculopathy(m48.062)  Procedure(s) with comments: L2/3 Lateral Interbody Fusion L3/4 Lateral Interbody Fusion with Plating L2-3 Pedical Screw Fixation with Arthrodesis  SURGEON:  * Nathaniel Man, MD Anabel Halon, PA Assistant  ANESTHESIA:General  OPERATIVE FINDINGS:Stenosis at L2/3, L3/4  Indication: Jodi Kirby presented to clinic on 12/14 with ongoing leg pain and back pain.  MRI showed stenosis in lateral recess/foramen bilaterally at L2/3 and L3/4.There was also a previous fusion at L4/5 and adjacent level degeneration and small listhesis. Given the findings and pain, we discussed a L2-4 fusion to relieve the stenosis and pain.Therisks of surgery were explained to include hematoma, infection, damage to nerve roots, CSF leak, weakness, numbness, pain, need for future surgery including fusion, heart attack, and stroke.Sheelected to proceed with surgery for symptom relief.   Procedure The patient was brought to the OR after informed consent was obtained.She was given general anesthesia and intubated by the anesthesia service. Vascular access lines were placed.A foley catheter was placed  Neuromonitoring electrodes were placed for EMG in lower extremities. The patient was then placedlateral with left side up on the [facility] ensuring all pressure points were padded. A time-out was performed per protocol.   The bed was positioned to allow for access to retroperitoneal space via the left flank and fluoroscopy used to adequatelyidentifytheL2/3 andL3/4disc space. An incision was planned overlying the disc spacesand then the patient was draped and prepped in a sterile fashion. Local anesthetic was instilled in planned incision. The incision was opened sharply  and blunt dissection used to identify the fascia. This was entered bluntly to identify the retroperitoneal space. A dilator with neuromonitoring was placed down and fluoroscopy ensured we were on the L3/4disc space. The dilator was moved to an area overlying the mid portion of the disc space and there was stimulation of the psoas muscle above 69mA. A k -wire was placed into the disc space and confirmed with fluoroscopy. A series of dilators were then used and then the retractor placed. This was gently expanded and the k-wire removed. A stimulator probe confirmed no nerve was within working space. The disc was exposed bluntly and then incised. Disc material was removed with rongeursand the endplates cleared. A series of trials were used to expand the disc space and ensure adequate sizing. A combination of rasps and curettes were used to remove disc material. Next, a10x18x XLgraft packed withPropel putty was placed until in good position. This had been packed to promote arthrodesis. An attached plate was flush laterally and then two 40 mm screws were placed until flush to secure to the bone above and below.The fluoroscopy confirmed good placement and then the retractor was removed.   The same procedure was performed at the L2/3 disc space where a 10x18x XLgraft packed withOsteocelwas placed until in good position. This had been packed to promote arthrodesis. The area was irrigated and hemostasis obtained. The fluoroscopy confirmed good placement and then the retractor was removed. The wound was irrigated and then closed in layers with 0-vicryl on fascia and 2-0, 3-0 vicryls on dermis and subcutaneous layers. Adhesive was placed on skin.  The patientwas then turned to a prone position andwas sterilely prepped and draped.The fluoroscopy was used to identify the L2-3 levelswith pedicles marked.Bilateral incisions approximately 3 cm off midline were plannedlocal  anesthetic with epinephrine was instilled into the planned incisions. The skin was opened sharply and  the dissection taken to the fascia. This was incised withcautery. The Jamshidi cannulator was placed on the lateral portion of the left L3pedicle. It was advanced while monitoring for nerve irritation. Once to 3cm mark with stimulation remaining over75mA, the inner canula was removed and the K wire placed. Fluoroscopy confirmed good placement within pedicle and Jamshidi removed. A tapping screw was used advancing down the k-wire while stimulating. This same procedure was performed at L3 on therightand then at L2 bilaterally.   Next, a 6.66mm x67mm screw was placed at each level, using fluoroscopy to ensure adequate trajectories and monitoring with stimulation for any signs of breach. The K-wire was removed as the screw was advanced. Fluoroscopy confirmed adequate depth of screws.Stimulation of the screws showed no stimulation below31mA.Next,a36mm rod wasplacedon the right and 6mm on the leftand set screws were placed and torqued. Fluoroscopy confirmed good placement of the rods and adequate length.   The woundswereirrigated with antibiotic laden saline and hemostasis was achieved.The fasciawas closedwith 0 vicryl.Next, multiple subcutaneous and dermal layers were closed with 2-0 vicryl until the epidermis was well approximated. The skin was closed withDermabond.The patient was returned to the supine position  ESTIMATED BLOOD LOSS: 200cc  SPECIMENS None  IMPLANT CAGE MODULUS XL [id number] - 10 - [id number]  Inventory Item: CAGE MODULUS XL [id number] - 10 Serial no.:  Model/Cat no.: [id number]  Implant name: CAGE MODULUS XL [id number] - 10 - [id number] Laterality: N/A Area: Spine Lumbar  Manufacturer: [employer] Date of Manufacture:    Action: Implanted Number Used: 1   Device Identifier:  Device Identifier Type:     PLATE XLIF MODULUS DS 10 - [id number]  Inventory  Item: PLATE XLIF MODULUS DS 10 Serial no.:  Model/Cat no.: [id number]  Implant name: PLATE XLIF MODULUS DS 10 - [id number] Laterality: N/A Area: Spine Lumbar  Manufacturer: [employer] Date of Manufacture:    Action: Implanted Number Used: 1   Device Identifier:  Device Identifier Type:     PUTTY PROPEL MEDIUM - [id number]  Inventory Item: PUTTY PROPEL MEDIUM Serial no.:  Model/Cat no.: [id number]  Implant name: Hulen Shouts MEDIUM - [id number] Laterality: N/A Area: Spine Lumbar  Manufacturer: [employer] Date of Manufacture:    Action: Implanted Number Used: 1   Device Identifier:  Device Identifier Type:     CAGE MODULUS XL [id number] - 10 - [id number]  Inventory Item: CAGE MODULUS XL [id number] - 10 Serial no.:  Model/Cat no.: [id number]  Implant name: CAGE MODULUS XL [id number] - 10 - [id number] Laterality: N/A Area: Back   Manufacturer: [employer] Date of Manufacture:    Action: Implanted Number Used: 1   Device Identifier:  Device Identifier Type:     Hulen Shouts SMALL - [id number]  Inventory Item: PUTTY PROPEL SMALL Serial no.:  Model/Cat no.: [id number]  Implant name: Hulen Shouts SMALL - [id number] Laterality: N/A Area: Back   Manufacturer: [employer] Date of Manufacture:    Action: Implanted Number Used: 1   Device Identifier:  Device Identifier Type:     conent xlf screw 5.5 x 40  Inventory Item:  Serial no.:  Model/Cat no.:   Implant name: conent xlf screw 5.5 x 40 Laterality: N/A Area: Spine Lumbar  Manufacturer:  Date of Manufacture:    Action: Implanted Number Used: 2   Device Identifier:  Device Identifier Type:     SCREW LOCK RELINE 5.5 TULIP - [id number]  Inventory Item: SCREW LOCK RELINE 5.5 TULIP Serial no.:  Model/Cat no.: [id number]  Implant name:  SCREW LOCK RELINE 5.5 TULIP - [id number] Laterality: N/A Area: Spine Lumbar  Manufacturer: [employer] Date of Manufacture:    Action: Implanted Number Used: 4   Device Identifier:  Device Identifier Type:     SCREW RELINE  RED 6.5X45MM POLY - [id number]  Inventory Item: SCREW RELINE RED 6.5X45MM POLY Serial no.:  Model/Cat no.: [id number]  Implant name: SCREW RELINE RED 6.5X45MM POLY - [id number] Laterality: N/A Area: Spine Lumbar  Manufacturer: [employer] Date of Manufacture:    Action: Implanted Number Used: 4   Device Identifier:  Device Identifier Type:        I performed the case in its entiretywith assistance of PA, Anabel Halon  Jodi Chris, MD [phone]

## 2020-12-16 NOTE — Anesthesia Preprocedure Evaluation (Signed)
Anesthesia Evaluation  Patient identified by MRN, date of birth, ID band Patient awake    Reviewed: Allergy & Precautions, H&P , NPO status , Patient's Chart, lab work & pertinent test results  Airway Mallampati: II       Dental no notable dental hx.    Pulmonary sleep apnea ,    Pulmonary exam normal breath sounds clear to auscultation       Cardiovascular hypertension, + angina + CAD and + DOE  Normal cardiovascular exam Rhythm:Regular Rate:Normal     Neuro/Psych  Neuromuscular disease negative psych ROS   GI/Hepatic Neg liver ROS, GERD  ,  Endo/Other  negative endocrine ROS  Renal/GU CRFRenal disease  negative genitourinary   Musculoskeletal negative musculoskeletal ROS (+)   Abdominal   Peds negative pediatric ROS (+)  Hematology negative hematology ROS (+)   Anesthesia Other Findings Past Medical History: No date: Allergic rhinitis, cause unspecified No date: Allergy No date: Coronary artery disease No date: Dysmetabolic syndrome X No date: Dysuria No date: Esophageal reflux No date: Essential hypertension, benign No date: HNP (herniated nucleus pulposus), lumbar     Comment:  Dr[facility] Yves Dill [facility]) No date: Hyperlipidemia No date: Lumbago No date: Lumbar radiculitis     Comment:  Dr. Yves Dill [facility]) No date: Lump or mass in breast     Comment:  LEFT BREAST No date: Myalgia and myositis, unspecified No date: Obstructive sleep apnea     Comment:  CPAP No date: Osteoarthrosis, unspecified whether generalized or localized,  lower leg No date: Other abnormal glucose No date: Other and unspecified angina pectoris No date: Personal history of malignant neoplasm of other endocrine  glands and related structures No date: Toxic effect of venom(989.5) No date: Unspecified iridocyclitis No date: Unspecified vitamin D deficiency 2018: Vertigo     Comment:  1 episode   Reproductive/Obstetrics negative OB  ROS                             Anesthesia Physical Anesthesia Plan  ASA: III  Anesthesia Plan: General   Post-op Pain Management:    Induction:   PONV Risk Score and Plan: 3 and Ondansetron, Dexamethasone and Treatment may vary due to age or medical condition  Airway Management Planned:   Additional Equipment:   Intra-op Plan:   Post-operative Plan: Extubation in OR  Informed Consent: I have reviewed the patients History and Physical, chart, labs and discussed the procedure including the risks, benefits and alternatives for the proposed anesthesia with the patient or authorized representative who has indicated his/her understanding and acceptance.     Dental advisory given  Plan Discussed with: CRNA, Surgeon and Anesthesiologist  Anesthesia Plan Comments:         Anesthesia Quick Evaluation

## 2020-12-16 NOTE — Progress Notes (Signed)
Patient states she has low back pain that radiates through her groin and down her right lower extremity only. No pain in her left leg. Plantar and dorsiflexion equal.

## 2020-12-16 NOTE — Anesthesia Procedure Notes (Addendum)
Procedure Name: Intubation Date/Time: 12/16/2020 3:43 PM Performed by: Rona Ravens, CRNA Pre-anesthesia Checklist: Emergency Drugs available, Patient identified, Suction available, Patient being monitored and Timeout performed Patient Re-evaluated:Patient Re-evaluated prior to induction Oxygen Delivery Method: Circle system utilized Preoxygenation: Pre-oxygenation with 100% oxygen Induction Type: IV induction Ventilation: Mask ventilation without difficulty Laryngoscope Size: McGraph and 4 Grade View: Grade II Tube type: Oral Number of attempts: 2 (First attempt MAC 3 Epiglottis only unable to get under due to immobile despite anterior pressure. Mask ventiliated in between attempts) Airway Equipment and Method: Stylet and Bougie stylet Placement Confirmation: ETT inserted through vocal cords under direct vision,  positive ETCO2 and breath sounds checked- equal and bilateral Secured at: 22 cm Tube secured with: Tape Dental Injury: Teeth and Oropharynx as per pre-operative assessment  Difficulty Due To: Difficulty was unanticipated and Difficult Airway- due to immobile epiglottis Future Recommendations: Recommend- induction with short-acting agent, and alternative techniques readily available

## 2020-12-16 NOTE — Interval H&P Note (Signed)
History and Physical Interval Note:  12/16/2020 2:20 PM  Jodi Kirby  has presented today for surgery, with the diagnosis of lumbar stenosis, lumbar radiculopathy.  The various methods of treatment have been discussed with the patient and family. After consideration of risks, benefits and other options for treatment, the patient has consented to  Procedure(s): L2/3, L3/4 LATERAL LUMBAR FUSION, L2-4 PEDICLE SCREW FIXATION (N/A) as a surgical intervention.  The patient's history has been reviewed, patient examined, no change in status, stable for surgery.  I have reviewed the patient's chart and labs.  Questions were answered to the patient's satisfaction.     Lucy Chris

## 2020-12-16 NOTE — H&P (Signed)
Jodi Kirby is an 75 y.o. female.   Chief Complaint: Back and leg pain HPI: Jodi Kirby is here for evaluation of ongoing symptoms in her low back and legs. She has a known prior L4-5 fusion many years ago which she says did help with similar symptoms but more recently in the past year she has developed worsening pain from the back going into the proximal legs. She does state the right side is worse than the left and there will be pain into the anterior thigh. She does not endorse any pain going to the feet. She did not endorse any persistent numbness. She did attend physical therapy but she felt these made the symptoms worse. She did go for injections in her back and she felt she got at most a few days of relief. She has not found any medications to be very successful in relieving her pain. She did go for evaluation of her hip on the right with orthopedics but after imaging was obtained, there was more concern for pathology from the lumbar spine. She has an MRI of the lumbar spine available for review.    Past Medical History:  Diagnosis Date  . Allergic rhinitis, cause unspecified   . Allergy   . Coronary artery disease   . Dysmetabolic syndrome X   . Dysuria   . Esophageal reflux   . Essential hypertension, benign   . HNP (herniated nucleus pulposus), lumbar    Dr. Yves Dill [facility])  . Hyperlipidemia   . Lumbago   . Lumbar radiculitis    Dr. Yves Dill [facility])  . Lump or mass in breast    LEFT BREAST  . Myalgia and myositis, unspecified   . Obstructive sleep apnea    CPAP  . Osteoarthrosis, unspecified whether generalized or localized, lower leg   . Other abnormal glucose   . Other and unspecified angina pectoris   . 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 Toxic effect of venom(989.5)   . Unspecified iridocyclitis   . Unspecified vitamin D deficiency   . Vertigo 2018   1 episode    Past Surgical History:  Procedure Laterality  Date  . ABDOMINAL HYSTERECTOMY  1970  . ANKLE SURGERY Right 1980  . BLADDER SURGERY  2012  . CARDIAC CATHETERIZATION  2003   [place]  . CARDIAC CATHETERIZATION     [place]  . CATARACT EXTRACTION W/PHACO Right 02/09/2018   Procedure: CATARACT EXTRACTION PHACO AND INTRAOCULAR LENS PLACEMENT (IOC) RIGHT;  Surgeon: Lockie Mola, MD;  Location: [facility];  Service: Ophthalmology;  Laterality: Right;  sleep apnea  . CATARACT EXTRACTION W/PHACO Left 03/09/2018   Procedure: CATARACT EXTRACTION PHACO AND INTRAOCULAR LENS PLACEMENT (IOC);  Surgeon: Lockie Mola, MD;  Location: [facility];  Service: Ophthalmology;  Laterality: Left;  IVA TOPICALLEFT  . COLONOSCOPY  2008, 2015   Dr. Servando Snare  . CYSTOSTOMY    . KNEE SURGERY  08/18/2011   arthroscopic right  . LUMBAR FUSION     L4-5, S-1  . NECK SURGERY  2003  . TOTAL KNEE ARTHROPLASTY Right 04/29/2015   Procedure: TOTAL KNEE ARTHROPLASTY;  Surgeon: Erin Sons, MD;  Location: [facility] ORS;  Service: Orthopedics;  Laterality: Right;  . TUBAL LIGATION      Family History  Problem Relation Age of Onset  . Kidney disease Mother   . Hypertension Mother   . CAD Father   . Healthy Sister   . Kidney cancer Neg  Hx   . Bladder Cancer Neg Hx    Social History:  reports that she has never smoked. She has never used smokeless tobacco. She reports that she does not drink alcohol and does not use drugs.  Allergies:  Allergies  Allergen Reactions  . Niaspan [Niacin Er] Other (See Comments)    Patient states she can't remember the reaction because it so long ago.  . Oxycodone-Acetaminophen Nausea Only  . Statins Other (See Comments)    Muscle and joint pain.    Medications Prior to Admission  Medication Sig Dispense Refill  . acetaminophen (TYLENOL) 650 MG CR tablet Take 650 mg by mouth daily as needed for pain.    Marland Kitchen alendronate (FOSAMAX) 70 MG tablet TAKE 1 TABLET WEEKLY (Patient taking differently: Take 70 mg by  mouth every Thursday. TAKE 1 TABLET WEEKLY) 12 tablet 2  . Ascorbic Acid (VITAMIN C WITH ROSE HIPS) 1000 MG tablet Take 1,000 mg by mouth daily. With Zinc    . aspirin EC 81 MG tablet Take 81 mg by mouth every morning.    . Cholecalciferol (VITAMIN D3) 1000 UNITS CAPS Take 1,000 Units by mouth every morning.     Marland Kitchen EPINEPHrine (EPI-PEN) 0.3 mg/0.3 mL DEVI Inject 0.3 mg into the muscle as needed (anaphylaxis).    Marland Kitchen ezetimibe (ZETIA) 10 MG tablet Take 1 tablet (10 mg total) by mouth daily. 90 tablet 3  . fluticasone (FLONASE) 50 MCG/ACT nasal spray USE TWO SPRAY(S) IN EACH NOSTRIL AT BEDTIME (Patient taking differently: Place 2 sprays into both nostrils at bedtime. USE TWO SPRAY(S) IN EACH NOSTRIL AT BEDTIME) 48 g 1  . Multiple Vitamins-Minerals (CENTRUM SILVER PO) Take 1 tablet by mouth every morning.     . nitroGLYCERIN (NITROSTAT) 0.4 MG SL tablet Place 1 tablet (0.4 mg total) under the tongue every 5 (five) minutes as needed for chest pain. 25 tablet 0  . Olmesartan-amLODIPine-HCTZ 40-10-12.5 MG TABS Take 1 tablet by mouth daily. 90 tablet 3  . omeprazole (PRILOSEC) 20 MG capsule Take 1 capsule (20 mg total) by mouth daily. 90 capsule 1  . traZODone (DESYREL) 50 MG tablet Take 1 tablet (50 mg total) by mouth at bedtime as needed for sleep. 90 tablet 1  . icosapent Ethyl (VASCEPA) 1 g capsule Take 2 capsules (2 g total) by mouth 2 (two) times daily. (Patient not taking: Reported on 11/12/2020) 360 capsule 1    No results found for this or any previous visit (from the past 48 hour(s)). No results found.  Review of Systems General ROS: Negative Psychological ROS: Negative Ophthalmic ROS: Negative ENT ROS: Negative Hematological and Lymphatic ROS: Negative  Endocrine ROS: Negative Respiratory ROS: Negative Cardiovascular ROS: Negative Gastrointestinal ROS: Negative Genito-Urinary ROS: Negative Musculoskeletal ROS: Positive for back pain Neurological ROS: Positive for leg pain, negative for  numbness Dermatological ROS: Negative   Blood pressure (!) 164/60, pulse 74, temperature 97.7 F (36.5 C), temperature source Oral, resp. rate 18, height 5\' 3"  (1.6 m), weight 82.1 kg, SpO2 100 %. Physical Exam  General appearance: Alert, cooperative, in no acute distress Head: Normocephalic, atraumatic Eyes: Normal, EOM intact Oropharynx: Wearing facemask Back: Well-healed midline incision Ext: No edema in LE bilaterally, warm extremities  Neurologic exam:  Mental status: alertness: alert, affect: normal Speech: fluent and clear Motor:strength symmetric 5/5 in bilateral hip flexion, knee flexion, knee extension, dorsiflexion, and plantarflexion Sensory: intact to light touch in bilateral lower extremities Reflexes: 2+ and symmetric bilaterally for patella Gait: normal  Imaging: MRI lumbar spine: There is evidence of a prior L4-5 fusion with interbody graft and pedicle screws. There is a good decompression at this level. There is a normal lordotic curvature above this but there is moderate to severe degenerative disease noted at L2-3 and L3-4. There are broad-based disc bulges at this level coupled with facet hypertrophy and ligament hypertrophy resulting in severe stenosis at L2-3 and more mild stenosis at L3-4. There is no obvious stenosis above these levels.   Assessment/Plan Proceed with L2-4 fusion with lateral interbody and pedicle screw fixation  Lucy Chris, MD 12/16/2020, 2:18 PM

## 2020-12-16 NOTE — Transfer of Care (Signed)
Immediate Anesthesia Transfer of Care Note  Patient: Jodi Kirby  Procedure(s) Performed: L2/3, L3/4 LATERAL LUMBAR FUSION, L2-4 PEDICLE SCREW FIXATION (N/A Back)  Patient Location: PACU  Anesthesia Type:General  Level of Consciousness: sedated  Airway & Oxygen Therapy: Patient Spontanous Breathing and Patient connected to face mask oxygen  Post-op Assessment: Report given to RN and Post -op Vital signs reviewed and stable  Post vital signs: Reviewed and stable  Last Vitals:  Vitals Value Taken Time  BP 163/77 12/16/20 2018  Temp 36.2 C 12/16/20 2017  Pulse 90 12/16/20 2023  Resp 19 12/16/20 2023  SpO2 100 % 12/16/20 2023  Vitals shown include unvalidated device data.  Last Pain:  Vitals:   12/16/20 2017  TempSrc:   PainSc: 0-No pain         Complications: No complications documented.

## 2020-12-17 ENCOUNTER — Inpatient Hospital Stay: Payer: Medicare Other

## 2020-12-17 MED ORDER — MAGNESIUM HYDROXIDE 400 MG/5ML PO SUSP
30.0000 mL | Freq: Every evening | ORAL | Status: DC | PRN
  Administered 2020-12-17: 30 mL via ORAL
  Filled 2020-12-17: qty 30

## 2020-12-17 MED ORDER — BISACODYL 10 MG RE SUPP: 10.0000 mg | Freq: Every day | RECTAL | Status: DC | PRN

## 2020-12-17 NOTE — Anesthesia Postprocedure Evaluation (Signed)
Anesthesia Post Note  Patient: Jodi Kirby  Procedure(s) Performed: L2/3, L3/4 LATERAL LUMBAR FUSION, L2-4 PEDICLE SCREW FIXATION (N/A Back)  Patient location during evaluation: PACU Anesthesia Type: General Level of consciousness: awake Pain management: pain level controlled Vital Signs Assessment: post-procedure vital signs reviewed and stable Respiratory status: spontaneous breathing Cardiovascular status: stable Postop Assessment: no apparent nausea or vomiting Anesthetic complications: no   No complications documented.   Last Vitals:  Vitals:   12/17/20 0009 12/17/20 0435  BP: 113/90 (!) 151/55  Pulse: 88 83  Resp: 17 16  Temp: 36.5 C 36.4 C  SpO2: 100% 97%    Last Pain:  Vitals:   12/17/20 0439  TempSrc:   PainSc: 0-No pain                 Emilio Math

## 2020-12-17 NOTE — Progress Notes (Signed)
Physical Therapy Treatment Patient Details Name: Jodi Kirby MRN: [id number] DOB: 07-03-1946 Today's Date: 12/17/2020    History of Present Illness Pt is a 75 yo female diagnosed with lumbar stenosis and radiculopathy and is s/p elective L2/3 and L3/4 fusion and L2/3 pedical screw fixation with arthrodesis.  PMH includes: L4/5 fusion, CAD, lumbago, OA, and R TKA.    PT Comments    Pt was pleasant and motivated to participate during the session and reported no nausea this session.  Pt did c/o 7/10 back pain with nursing providing pain medication during the session.  Pt required only min A with log roll training and was able to increase her max amb distance to 100 feet this session.  Pt reported that back pain was her primary limiting factor with ambulation with no other adverse symptoms noted.  Pt will benefit from HHPT services upon discharge to safely address deficits listed in patient problem list for decreased caregiver assistance and eventual return to PLOF.    Follow Up Recommendations  Home health PT;Supervision for mobility/OOB     Equipment Recommendations  None recommended by PT    Recommendations for Other Services       Precautions / Restrictions Precautions Precautions: Back;Fall Required Braces or Orthoses: Spinal Brace Spinal Brace: Lumbar corset;Applied in sitting position Spinal Brace Comments: Off in bed, don/doff in sitting, ok to ambulate to the BR without the brace Restrictions Weight Bearing Restrictions: No    Mobility  Bed Mobility Overal bed mobility: Needs Assistance Bed Mobility: Rolling;Sidelying to Sit Rolling: Min assist Sidelying to sit: Min assist       General bed mobility comments: Min A for BLE control during log roll training    Transfers Overall transfer level: Needs assistance Equipment used: Rolling walker (2 wheeled) Transfers: Sit to/from Stand Sit to Stand: Min guard         General transfer comment: Min cues for  sequencing for back precaution compliance with pt requiring extra time and effort to stand  Ambulation/Gait Ambulation/Gait assistance: Min guard Gait Distance (Feet): 100 Feet Assistive device: Rolling walker (2 wheeled) Gait Pattern/deviations: Step-through pattern;Decreased step length - right;Decreased step length - left Gait velocity: decreased   General Gait Details: Slow cadence with short B step length but steady without LOB with amb distance limited by back pain to a max of 100 feet   Stairs             Wheelchair Mobility    Modified Rankin (Stroke Patients Only)       Balance Overall balance assessment: Needs assistance   Sitting balance-Leahy Scale: Good     Standing balance support: Single extremity supported;During functional activity Standing balance-Leahy Scale: Good                              Cognition Arousal/Alertness: Awake/alert Behavior During Therapy: WFL for tasks assessed/performed Overall Cognitive Status: Within Functional Limits for tasks assessed                                        Exercises Total Joint Exercises Ankle Circles/Pumps: AROM;Strengthening;Both;10 reps Quad Sets: Strengthening;Both;10 reps Gluteal Sets: Strengthening;Both;10 reps Heel Slides: AROM;Strengthening;Both;5 reps Long Arc Quad: AROM;Strengthening;Both;10 reps Knee Flexion: AROM;Strengthening;Both;10 reps Marching in Standing: AROM;Strengthening;Both;10 reps;Standing Other Exercises Other Exercises: Log roll training Other Exercises: LBD, sit<>stand, sitting/standing balance/tolerance  General Comments        Pertinent Vitals/Pain Pain Assessment: 0-10 Pain Score: 7  Pain Location: Low back Pain Descriptors / Indicators: Aching;Sore Pain Intervention(s): Monitored during session;Patient requesting pain meds-RN notified;RN gave pain meds during session;Repositioned    Home Living Family/patient expects to be  discharged to:: Private residence Living Arrangements: Spouse/significant other Available Help at Discharge: Family;Available 24 hours/day Type of Home: House Home Access: Stairs to enter Entrance Stairs-Rails: Right Home Layout: One level Home Equipment: Walker - 2 wheels;Bedside commode;Grab bars - tub/shower;Shower seat - built in      Prior Function Level of Independence: Independent      Comments: Ind amb community distances without an AD, no fall history, Ind with ADLs   PT Goals (current goals can now be found in the care plan section) Acute Rehab PT Goals Patient Stated Goal: To be more active without pain Progress towards PT goals: Progressing toward goals    Frequency    BID      PT Plan Current plan remains appropriate    Co-evaluation              AM-PAC PT "6 Clicks" Mobility   Outcome Measure  Help needed turning from your back to your side while in a flat bed without using bedrails?: A Little Help needed moving from lying on your back to sitting on the side of a flat bed without using bedrails?: A Little Help needed moving to and from a bed to a chair (including a wheelchair)?: A Little Help needed standing up from a chair using your arms (e.g., wheelchair or bedside chair)?: A Little Help needed to walk in hospital room?: A Little Help needed climbing 3-5 steps with a railing? : A Little 6 Click Score: 18    End of Session Equipment Utilized During Treatment: Gait belt;Back brace Activity Tolerance: Patient limited by pain Patient left: in chair;with call bell/phone within reach;with chair alarm set;with family/visitor present Nurse Communication: Mobility status;Patient requests pain meds PT Visit Diagnosis: Pain;Muscle weakness (generalized) (M62.81);Difficulty in walking, not elsewhere classified (R26.2) Pain - Right/Left:  (low back)     Time: 6834-1962 PT Time Calculation (min) (ACUTE ONLY): 24 min  Charges:  $Gait Training: 8-22  mins $Therapeutic Exercise: 8-22 mins                    D. Scott Ori Trejos PT, DPT 12/17/20, 4:21 PM

## 2020-12-17 NOTE — Progress Notes (Signed)
Progress Note   Date: 12/17/2020  Postop day #1 from L2-3, L3-4 lateral fusion, L2-3 pedicle screw fixation  Subjective: Patient is tolerating a liquid diet this morning but denies any flatus.  She is having some back pain especially with movement, milder left flank pain.  She is not having any abdominal pain.  She does have some groin pain bilaterally with movement but denies any pain radiating down her legs.  She does not endorse any new numbness.  Vital Signs: Temp:  [97.1 F (36.2 C)-98.7 F (37.1 C)] 97.6 F (36.4 C) (02/15 0435) Pulse Rate:  [74-96] 83 (02/15 0435) Resp:  [15-25] 16 (02/15 0435) BP: (113-164)/(38-90) 151/55 (02/15 0435) SpO2:  [95 %-100 %] 97 % (02/15 0435) Weight:  [82.1 kg] 82.1 kg (02/14 1045) Temp (24hrs), Avg:97.8 F (36.6 C), Min:97.1 F (36.2 C), Max:98.7 F (37.1 C)  Weight: 82.1 kg   Problem List Patient Active Problem List   Diagnosis Date Noted  . Lumbar stenosis 12/16/2020  . Benign hypertension with chronic kidney disease, stage III (HCC) 12/22/2018  . History of lumbar fusion 07/19/2018  . DDD (degenerative disc disease), lumbosacral 07/19/2018  . Obesity (BMI 30.0-34.9) 07/19/2018  . DOE (dyspnea on exertion) 07/11/2018  . Mild cognitive impairment 12/21/2017  . Aortic atherosclerosis (HCC) 05/15/2017  . Hyperglycemia 11/17/2016  . Osteopenia 05/15/2016  . Impingement syndrome of left shoulder 05/12/2016  . Insomnia 11/08/2015  . Angina pectoris (HCC) 07/10/2015  . Seasonal allergic rhinitis 07/09/2015  . GERD (gastroesophageal reflux disease) 07/09/2015  . Bee sting allergy 07/09/2015  . History of knee replacement procedure of right knee 04/29/2015  . History of colonic polyps 09/24/2014  . Fibrocystic breast 09/24/2014  . Primary osteoarthritis of right knee 06/12/2014  . Hyperlipidemia 02/09/2013  . Atherosclerosis of native coronary artery of native heart with stable angina pectoris (HCC) 02/09/2013  . Obstructive sleep  apnea on CPAP 02/09/2013  . Hypertension 02/09/2013    Medications: Scheduled Meds: . losartan  100 mg Oral Daily   And  . amLODipine  10 mg Oral Daily   And  . hydrochlorothiazide  12.5 mg Oral Daily  . vitamin C with rose hips  1,000 mg Oral Daily  . cholecalciferol  1,000 Units Oral BH-q7a  . docusate sodium  100 mg Oral BID  . enoxaparin (LOVENOX) injection  40 mg Subcutaneous Q24H  . ezetimibe  10 mg Oral Daily  . fluticasone  2 spray Each Nare QHS  . multivitamin with minerals   Oral BH-q7a  . pantoprazole  40 mg Oral Daily  . sodium chloride flush  3 mL Intravenous Q12H   Continuous Infusions: . sodium chloride    . sodium chloride 75 mL/hr at 12/16/20 2243  .  ceFAZolin (ANCEF) IV 2 g (12/16/20 2244)  . methocarbamol (ROBAXIN) IV     PRN Meds:.acetaminophen **OR** acetaminophen, HYDROmorphone (DILAUDID) injection, menthol-cetylpyridinium **OR** phenol, methocarbamol **OR** methocarbamol (ROBAXIN) IV, ondansetron **OR** ondansetron (ZOFRAN) IV, oxyCODONE, sodium chloride flush, traZODone  Labs:  Lab Results  Component Value Date   WBC 8.7 12/09/2020   WBC 6.7 04/15/2020   HCT 34.3 (L) 12/09/2020   HCT 32.8 (L) 04/15/2020   HCT 36.3 03/18/2014   PLT 295 12/09/2020   PLT 260 04/15/2020   PLT 212 03/18/2014    Lab Results  Component Value Date   INR 1.1 12/09/2020   APTT 32 12/09/2020   Lab Results  Component Value Date   NA 143 12/09/2020   NA 142 04/15/2020  NA 141 03/18/2014   K 3.4 (L) 12/09/2020   K 4.0 04/15/2020   K 3.6 03/18/2014   BUN 17 12/09/2020   BUN 15 04/15/2020   BUN 11 03/18/2014   No results found for: MG  Exam:  Awake, alert Left flank and back incision is dry and flat  5 out of 5 strength in bilateral lower extremities throughout all muscle groups Sensation intact to light touch   Assessment/plan Postop day 1 from L2-3, L3-4 lateral interbody fusion, L2-3 pedicle screw fixation  -Physical and Occupational Therapy  ordered -Continue full liquid diet  -CT lumbar spine ordered this morning, will follow up lumbar spine x-rays -Continue pain control with IV and oral pain medication -Remove Foley   Jodi Chris, MD

## 2020-12-17 NOTE — Evaluation (Signed)
Physical Therapy Evaluation Patient Details Name: Jodi Kirby MRN: [id number] DOB: 1946/09/10 Today's Date: 12/17/2020   History of Present Illness  Pt is a 75 yo female diagnosed with lumbar stenosis and radiculopathy and is s/p elective L2/3 and L3/4 fusion and L2/3 pedical screw fixation with arthrodesis.  PMH includes: L4/5 fusion, CAD, lumbago, OA, and R TKA.    Clinical Impression  Pt was pleasant and motivated to participate during the session but was somewhat limited by back pain and nausea.  Pt required min A during log roll training but was able to stand and ambulate with a RW with only CGA with good control and stability.  Pt's SpO2 and HR were WNL during the session and pt is expected to make good progress towards goals while in acute care.  Pt will benefit from HHPT services upon discharge to safely address deficits listed in patient problem list for decreased caregiver assistance and eventual return to PLOF.      Follow Up Recommendations Home health PT;Supervision for mobility/OOB    Equipment Recommendations  None recommended by PT    Recommendations for Other Services       Precautions / Restrictions Precautions Precautions: Back;Fall Required Braces or Orthoses: Spinal Brace Spinal Brace: Lumbar corset;Applied in sitting position Spinal Brace Comments: Off in bed, don/doff in sitting, ok to ambulate to the BR without the brace Restrictions Weight Bearing Restrictions: No      Mobility  Bed Mobility Overal bed mobility: Needs Assistance Bed Mobility: Rolling;Sidelying to Sit;Sit to Sidelying Rolling: Min assist Sidelying to sit: Min assist     Sit to sidelying: Min assist General bed mobility comments: Min A for trunk and BLE control during log roll training    Transfers Overall transfer level: Needs assistance Equipment used: Rolling walker (2 wheeled) Transfers: Sit to/from Stand Sit to Stand: Min guard         General transfer comment:  Mod verbal cues for sequencing  Ambulation/Gait Ambulation/Gait assistance: Min guard Gait Distance (Feet): 20 Feet Assistive device: Rolling walker (2 wheeled) Gait Pattern/deviations: Step-through pattern;Decreased step length - right;Decreased step length - left Gait velocity: decreased   General Gait Details: Slow cadence with short B step length but steady without LOB with distance limited by nausea  Stairs            Wheelchair Mobility    Modified Rankin (Stroke Patients Only)       Balance Overall balance assessment: Needs assistance   Sitting balance-Leahy Scale: Good     Standing balance support: Bilateral upper extremity supported;During functional activity Standing balance-Leahy Scale: Good                               Pertinent Vitals/Pain Pain Assessment: 0-10 Pain Score: 9  Pain Location: Low back Pain Descriptors / Indicators: Aching;Sore Pain Intervention(s): Premedicated before session;Monitored during session    Home Living Family/patient expects to be discharged to:: Private residence Living Arrangements: Spouse/significant other Available Help at Discharge: Family;Available 24 hours/day Type of Home: House Home Access: Stairs to enter Entrance Stairs-Rails: Right Entrance Stairs-Number of Steps: 2 Home Layout: One level Home Equipment: Walker - 2 wheels;Bedside commode;Grab bars - tub/shower;Shower seat - built in      Prior Function Level of Independence: Independent         Comments: Ind amb community distances without an AD, no fall history, Ind with ADLs     Hand Dominance  Extremity/Trunk Assessment   Upper Extremity Assessment Upper Extremity Assessment: Defer to OT evaluation    Lower Extremity Assessment Lower Extremity Assessment: Generalized weakness;LLE deficits/detail;RLE deficits/detail RLE Deficits / Details: RLE strength grossly 3+ to 4/5 and equal L/R but somewhat difficult to assess  secondary to back pain RLE Sensation: WNL RLE Coordination: WNL LLE Deficits / Details: LLE strength grossly 3+ to 4/5 and equal L/R but somewhat difficult to assess secondary to back pain LLE Sensation: WNL LLE Coordination: WNL       Communication   Communication: No difficulties  Cognition Arousal/Alertness: Awake/alert Behavior During Therapy: WFL for tasks assessed/performed Overall Cognitive Status: Within Functional Limits for tasks assessed                                        General Comments      Exercises Total Joint Exercises Ankle Circles/Pumps: AROM;Strengthening;Both;10 reps Quad Sets: Strengthening;Both;10 reps Gluteal Sets: Strengthening;Both;10 reps Heel Slides: AROM;Strengthening;Both;5 reps Long Arc Quad: AROM;Strengthening;Both;10 reps Knee Flexion: AROM;Strengthening;Both;10 reps Marching in Standing: AROM;Strengthening;Both;10 reps;Standing Other Exercises Other Exercises: General back precaution education provided Other Exercises: Lumbar corset donning/doffing education Other Exercises: HEP education for BLE APs, QS, GS, and LAQs x 10 each 5-6x/day Other Exercises: Log roll training   Assessment/Plan    PT Assessment Patient needs continued PT services  PT Problem List Decreased strength;Decreased activity tolerance;Decreased balance;Decreased mobility;Decreased knowledge of use of DME;Pain;Decreased knowledge of precautions       PT Treatment Interventions DME instruction;Gait training;Stair training;Functional mobility training;Therapeutic activities;Therapeutic exercise;Balance training;Patient/family education    PT Goals (Current goals can be found in the Care Plan section)  Acute Rehab PT Goals Patient Stated Goal: To be more active without pain PT Goal Formulation: With patient Time For Goal Achievement: 12/30/20 Potential to Achieve Goals: Good    Frequency BID   Barriers to discharge        Co-evaluation                AM-PAC PT "6 Clicks" Mobility  Outcome Measure Help needed turning from your back to your side while in a flat bed without using bedrails?: A Little Help needed moving from lying on your back to sitting on the side of a flat bed without using bedrails?: A Little Help needed moving to and from a bed to a chair (including a wheelchair)?: A Little Help needed standing up from a chair using your arms (e.g., wheelchair or bedside chair)?: A Little Help needed to walk in hospital room?: A Little Help needed climbing 3-5 steps with a railing? : A Little 6 Click Score: 18    End of Session Equipment Utilized During Treatment: Gait belt;Back brace Activity Tolerance: Other (comment) (limited by nausea) Patient left: in chair;with call bell/phone within reach;with chair alarm set;with family/visitor present Nurse Communication: Mobility status;Precautions;Other (comment) (Pt requested nausea medication) PT Visit Diagnosis: Pain;Muscle weakness (generalized) (M62.81);Difficulty in walking, not elsewhere classified (R26.2) Pain - Right/Left:  (low back) Pain - part of body:  (low back)    Time: 6144-3154 PT Time Calculation (min) (ACUTE ONLY): 36 min   Charges:   PT Evaluation $PT Eval Moderate Complexity: 1 Mod PT Treatments $Therapeutic Exercise: 8-22 mins $Therapeutic Activity: 8-22 mins        D. Elly Modena PT, DPT 12/17/20, 12:06 PM

## 2020-12-17 NOTE — TOC Initial Note (Signed)
Transition of Care (TOC) - Initial/Assessment Note    Patient Details  Name: Bryonna M Minihan MRN: [id number] Date of Birth: 02/13/1946  Transition of Care (TOC) CM/SW Contact:     D , RN Phone Number: 12/17/2020, 2:57 PM  Clinical Narrative:  RNCM met with patient and husband in room. Patient reports to feeling some better today but reports that she is still having significant pain. She feels that she may be able to go home tomorrow. RNCM discussed discharge recommendations and patient is agreeable to home health. She reports that she has had it in the past but does not remember with who and is open to any agency that is in network with her insurance. She reports that she already has a walker and all necessary equipment in the home.  RNCM reached out to Brittany with [facility] to see if she can accept referral for PT/OT.    Expected Discharge Plan: Home w Home Health Services Barriers to Discharge: No Barriers Identified   Patient Goals and CMS Choice        Expected Discharge Plan and Services Expected Discharge Plan: Home w Home Health Services       Living arrangements for the past 2 months: Single Family Home                           HH Arranged: PT,OT HH Agency: [facility] Date HH Agency Contacted: 12/17/20 Time HH Agency Contacted: 1456 Representative spoke with at HH Agency: Brittany  Prior Living Arrangements/Services Living arrangements for the past 2 months: Single Family Home Lives with:: Spouse   Do you feel safe going back to the place where you live?: Yes      Need for Family Participation in Patient Care: Yes (Comment) Care giver support system in place?: Yes (comment)   Criminal Activity/Legal Involvement Pertinent to Current Situation/Hospitalization: No - Comment as needed  Activities of Daily Living      Permission Sought/Granted                  Emotional Assessment     Affect (typically observed):  Appropriate,Calm Orientation: : Oriented to Situation,Oriented to  Time,Oriented to Place,Oriented to Self Alcohol / Substance Use: Not Applicable Psych Involvement: No (comment)  Admission diagnosis:  Lumbar stenosis [M48.061] Patient Active Problem List   Diagnosis Date Noted  . Lumbar stenosis 12/16/2020  . Benign hypertension with chronic kidney disease, stage III (HCC) 12/22/2018  . History of lumbar fusion 07/19/2018  . DDD (degenerative disc disease), lumbosacral 07/19/2018  . Obesity (BMI 30.0-34.9) 07/19/2018  . DOE (dyspnea on exertion) 07/11/2018  . Mild cognitive impairment 12/21/2017  . Aortic atherosclerosis (HCC) 05/15/2017  . Hyperglycemia 11/17/2016  . Osteopenia 05/15/2016  . Impingement syndrome of left shoulder 05/12/2016  . Insomnia 11/08/2015  . Angina pectoris (HCC) 07/10/2015  . Seasonal allergic rhinitis 07/09/2015  . GERD (gastroesophageal reflux disease) 07/09/2015  . Bee sting allergy 07/09/2015  . History of knee replacement procedure of right knee 04/29/2015  . History of colonic polyps 09/24/2014  . Fibrocystic breast 09/24/2014  . Primary osteoarthritis of right knee 06/12/2014  . Hyperlipidemia 02/09/2013  . Atherosclerosis of native coronary artery of native heart with stable angina pectoris (HCC) 02/09/2013  . Obstructive sleep apnea on CPAP 02/09/2013  . Hypertension 02/09/2013   PCP:  Sowles, Krichna, MD Pharmacy:   [id number] - [place], [place] - [address] [place] [address] [place] [address]   [place] [address] Phone: [phone] Fax: [phone]  [facility] - [place] - [address] [place] [address] Phone: [phone] Fax: [phone]  [facility] - [place], [place] - [address] AT Portal to Registered [facility] Sites [address] [place] [address] Phone: [phone] Fax: [phone]     Social Determinants of Health (SDOH)  Interventions    Readmission Risk Interventions No flowsheet data found.  

## 2020-12-17 NOTE — Anesthesia Postprocedure Evaluation (Signed)
Anesthesia Post Note  Patient: Maitlyn M Rowser  Procedure(s) Performed: L2/3, L3/4 LATERAL LUMBAR FUSION, L2-4 PEDICLE SCREW FIXATION (N/A Back)  Patient location during evaluation: PACU Anesthesia Type: General Level of consciousness: awake Pain management: pain level controlled Vital Signs Assessment: post-procedure vital signs reviewed and stable Respiratory status: spontaneous breathing Cardiovascular status: stable Postop Assessment: no apparent nausea or vomiting Anesthetic complications: no   No complications documented.   Last Vitals:  Vitals:   12/17/20 0009 12/17/20 0435  BP: 113/90 (!) 151/55  Pulse: 88 83  Resp: 17 16  Temp: 36.5 C 36.4 C  SpO2: 100% 97%    Last Pain:  Vitals:   12/17/20 0439  TempSrc:   PainSc: 0-No pain                 Elzabeth Mcquerry J Dejaun Vidrio     

## 2020-12-17 NOTE — Evaluation (Signed)
Occupational Therapy Evaluation Patient Details Name: Jodi Kirby MRN: [id number] DOB: 03/20/1946 Today's Date: 12/17/2020    History of Present Illness Pt is a 75 yo female diagnosed with lumbar stenosis and radiculopathy and is s/p elective L2/3 and L3/4 fusion and L2/3 pedical screw fixation with arthrodesis.  PMH includes: L4/5 fusion, CAD, lumbago, OA, and R TKA.   Clinical Impression   Jodi Kirby was seen for OT evaluation this date. Prior to hospital admission, pt was Independent for mobility and I/ADLs. Pt lives c spouse in home c 2 STE. Pt presents to acute OT demonstrating impaired ADL performance and functional mobility 2/2 decreased activity tolerance and functional strength/ROM deficits. Pt currently requires SETUP + SUPERVISION doff B socks seated EOC using reacher. CGA + HHA for ADL t/f and standing dynamic balance reaching inside BOS. MIN A don/doff brace seated EOC.   Pt and spouse educated in functional application of back precautions, log roll technique, AE/DME for bathing/dressing/ toileting, and home/routines modifications. Pt verbalized understanding of all education/training provided. Handout provided to support recall and carry over of learned precautions/techniques. Pt would benefit from skilled OT to address noted impairments and functional limitations (see below for any additional details) in order to maximize safety and independence while minimizing falls risk and caregiver burden. Upon hospital discharge, recommend HHOT to maximize pt safety and return to functional independence during meaningful occupations of daily life.    Follow Up Recommendations  Home health OT;Supervision - Intermittent    Equipment Recommendations  None recommended by OT    Recommendations for Other Services       Precautions / Restrictions Precautions Precautions: Back;Fall Required Braces or Orthoses: Spinal Brace Spinal Brace: Lumbar corset;Applied in sitting  position Spinal Brace Comments: Off in bed, don/doff in sitting, ok to ambulate to the BR without the brace Restrictions Weight Bearing Restrictions: No      Mobility Bed Mobility Overal bed mobility: Needs Assistance Bed Mobility: Rolling;Sidelying to Sit;Sit to Sidelying Rolling: Min assist Sidelying to sit: Min assist     Sit to sidelying: Min assist General bed mobility comments: pt received and left up in chair    Transfers Overall transfer level: Needs assistance Equipment used: 1 person hand held assist Transfers: Sit to/from Stand Sit to Stand: Min guard         General transfer comment: utilized BUE on arm rest    Balance Overall balance assessment: Needs assistance   Sitting balance-Leahy Scale: Good     Standing balance support: Single extremity supported;During functional activity Standing balance-Leahy Scale: Good                             ADL either performed or assessed with clinical judgement   ADL Overall ADL's : Needs assistance/impaired                                       General ADL Comments: SETUP + SUPERVISION doff B socks seated EOC using reacher. CGA + HHA for ADL t/f and standing dynamic balance. MIN A don/doff brace seated EOC                  Pertinent Vitals/Pain Pain Assessment: No/denies pain     Hand Dominance     Extremity/Trunk Assessment Upper Extremity Assessment Upper Extremity Assessment: Overall WFL for tasks assessed   Lower Extremity  Assessment Lower Extremity Assessment: Generalized weakness RLE Deficits / Details: RLE strength grossly 3+ to 4/5 and equal L/R but somewhat difficult to assess secondary to back pain RLE Sensation: WNL RLE Coordination: WNL LLE Deficits / Details: LLE strength grossly 3+ to 4/5 and equal L/R but somewhat difficult to assess secondary to back pain LLE Sensation: WNL LLE Coordination: WNL       Communication Communication Communication: No  difficulties   Cognition Arousal/Alertness: Awake/alert Behavior During Therapy: WFL for tasks assessed/performed Overall Cognitive Status: Within Functional Limits for tasks assessed                                     General Comments       Exercises Exercises: Other exercises Other Exercises Other Exercises: Pt and spouse educated re: OT role, DME recs, d/c recs, falls prevention, ECS, back precautions, log roll, adapted dressing techniques Other Exercises: LBD, sit<>stand, sitting/standing balance/tolerance Other Exercises: HEP education for BLE APs, QS, GS, and LAQs x 10 each 5-6x/day Other Exercises: Log roll training        Home Living Family/patient expects to be discharged to:: Private residence Living Arrangements: Spouse/significant other Available Help at Discharge: Family;Available 24 hours/day Type of Home: House Home Access: Stairs to enter [employer] of Steps: 2 Entrance Stairs-Rails: Right Home Layout: One level     Bathroom Shower/Tub: Producer, television/film/video: Handicapped height     Home Equipment: Environmental consultant - 2 wheels;Bedside commode;Grab bars - tub/shower;Shower seat - built in          Prior Functioning/Environment Level of Independence: Independent        Comments: Ind amb community distances without an AD, no fall history, Ind with ADLs        OT Problem List: Decreased range of motion;Decreased activity tolerance      OT Treatment/Interventions: Self-care/ADL training;Therapeutic exercise;Energy conservation;DME and/or AE instruction;Therapeutic activities;Patient/family education;Balance training    OT Goals(Current goals can be found in the care plan section) Acute Rehab OT Goals Patient Stated Goal: To be more active without pain OT Goal Formulation: With patient/family Time For Goal Achievement: 12/31/20 Potential to Achieve Goals: Good ADL Goals Pt Will Perform Grooming:  standing;Independently Pt Will Perform Lower Body Dressing: with modified independence;with adaptive equipment;sit to/from stand Pt Will Transfer to Toilet: Independently;ambulating;regular height toilet Pt Will Perform Toileting - Clothing Manipulation and hygiene: with modified independence;sit to/from stand;with adaptive equipment  OT Frequency: Min 1X/week    AM-PAC OT "6 Clicks" Daily Activity     Outcome Measure Help from another person eating meals?: None Help from another person taking care of personal grooming?: A Little Help from another person toileting, which includes using toliet, bedpan, or urinal?: A Little Help from another person bathing (including washing, rinsing, drying)?: A Little Help from another person to put on and taking off regular upper body clothing?: A Lot Help from another person to put on and taking off regular lower body clothing?: A Little 6 Click Score: 18   End of Session    Activity Tolerance: Patient tolerated treatment well Patient left: in chair;with call bell/phone within reach;with chair alarm set;with family/visitor present  OT Visit Diagnosis: Other abnormalities of gait and mobility (R26.89);Muscle weakness (generalized) (M62.81)                Time: 1638-4665 OT Time Calculation (min): 14 min Charges:  OT General Charges $OT  Visit: 1 Visit OT Evaluation $OT Eval Low Complexity: 1 Low OT Treatments $Self Care/Home Management : 8-22 mins  Kathie Dike, M.S. OTR/L  12/17/20, 2:04 PM  ascom [phone]

## 2020-12-18 ENCOUNTER — Other Ambulatory Visit: Payer: Self-pay

## 2020-12-18 MED ORDER — DOCUSATE SODIUM 100 MG PO CAPS: 100.0000 mg | ORAL_CAPSULE | Freq: Two times a day (BID) | ORAL | 0 refills | Status: DC

## 2020-12-18 MED ORDER — BISACODYL 10 MG RE SUPP
10.0000 mg | Freq: Once | RECTAL | Status: AC
  Administered 2020-12-18: 10 mg via RECTAL
  Filled 2020-12-18: qty 1

## 2020-12-18 MED ORDER — OXYCODONE HCL 5 MG PO TABS: 5.0000 mg | ORAL_TABLET | ORAL | 0 refills | Status: DC | PRN

## 2020-12-18 MED ORDER — POLYETHYLENE GLYCOL 3350 17 G PO PACK
17.0000 g | PACK | Freq: Every day | ORAL | Status: DC
  Administered 2020-12-18: 17 g via ORAL
  Filled 2020-12-18: qty 1

## 2020-12-18 MED ORDER — METHOCARBAMOL 500 MG PO TABS: 500.0000 mg | ORAL_TABLET | Freq: Three times a day (TID) | ORAL | 1 refills | Status: DC | PRN

## 2020-12-18 MED ORDER — POLYETHYLENE GLYCOL 3350 17 G PO PACK: 17.0000 g | PACK | Freq: Every day | ORAL | 0 refills | Status: AC

## 2020-12-18 NOTE — Progress Notes (Signed)
Physical Therapy Treatment Patient Details Name: Jodi Kirby MRN: [id number] DOB: [birth year] Today's Date: 12/18/2020    History of Present Illness Pt is a 75 yo female diagnosed with lumbar stenosis and radiculopathy and is s/p elective L2/3 and L3/4 fusion and L2/3 pedical screw fixation with arthrodesis.  PMH includes: L4/5 fusion, CAD, lumbago, OA, and R TKA.    PT Comments    Pt was pleasant and motivated to participate during the session and made good progress towards goals this session.   Pt did not require physical assistance with bed mobility tasks this session and was steady during amb and gait.  Pt's activity tolerance improved significantly this session and pt was able to ascend/descend two steps with one rail with good control and stability.  Pt will benefit from HHPT services upon discharge to safely address deficits listed in patient problem list for decreased caregiver assistance and eventual return to PLOF.   Follow Up Recommendations  Home health PT;Supervision for mobility/OOB     Equipment Recommendations  None recommended by PT    Recommendations for Other Services       Precautions / Restrictions Precautions Precautions: Back;Fall Required Braces or Orthoses: Spinal Brace Spinal Brace: Lumbar corset;Applied in sitting position Spinal Brace Comments: Off in bed, don/doff in sitting, ok to ambulate to the BR without the brace Restrictions Weight Bearing Restrictions: No    Mobility  Bed Mobility Overal bed mobility: Needs Assistance Bed Mobility: Rolling;Sidelying to Sit Rolling: Supervision Sidelying to sit: Supervision       General bed mobility comments: Min verbal cues for sequencing for log rolling but pt able to perform without physical assistance this session    Transfers Overall transfer level: Needs assistance Equipment used: Rolling walker (2 wheeled) Transfers: Sit to/from Stand Sit to Stand: Min guard         General  transfer comment: Min cues for sequencing for back precaution compliance with pt requiring extra time and effort to stand  Ambulation/Gait Ambulation/Gait assistance: Min guard Gait Distance (Feet): 150 Feet x 1, 80 feet x 1 Assistive device: Rolling walker (2 wheeled) Gait Pattern/deviations: Step-through pattern;Decreased step length - right;Decreased step length - left Gait velocity: decreased   General Gait Details: Slow cadence with short B step length but steady without LOB with min cues to amb further back from the RW   Stairs Stairs: Yes Stairs assistance: Min guard Stair Management: One rail Right;Backwards;Forwards Number of Stairs: 2 General stair comments: Pt able to ascend forwards and descend backwards 2 steps with one rail with good eccentric and concentric control   Wheelchair Mobility    Modified Rankin (Stroke Patients Only)       Balance Overall balance assessment: Needs assistance   Sitting balance-Leahy Scale: Good     Standing balance support: During functional activity;Bilateral upper extremity supported Standing balance-Leahy Scale: Good                              Cognition Arousal/Alertness: Awake/alert Behavior During Therapy: WFL for tasks assessed/performed Overall Cognitive Status: Within Functional Limits for tasks assessed                                        Exercises Total Joint Exercises Ankle Circles/Pumps: AROM;Strengthening;Both;10 reps Quad Sets: Strengthening;Both;10 reps Gluteal Sets: Strengthening;Both;10 reps Heel Slides: AROM;Strengthening;Both;5 reps Long Arc  Quad: AROM;Strengthening;Both;10 reps Knee Flexion: AROM;Strengthening;Both;10 reps Marching in Standing: AROM;Strengthening;Both;10 reps;Standing Other Exercises Other Exercises: Log roll training Other Exercises: Car transfer sequencing verbal education with pt and spouse Other Exercises: Back brace donning education     General Comments        Pertinent Vitals/Pain Pain Assessment: 0-10 Pain Score: 5  Pain Location: Low back Pain Descriptors / Indicators: Aching;Sore Pain Intervention(s): Premedicated before session;Monitored during session    Home Living                      Prior Function            PT Goals (current goals can now be found in the care plan section) Progress towards PT goals: Progressing toward goals    Frequency           PT Plan Current plan remains appropriate    Co-evaluation              AM-PAC PT "6 Clicks" Mobility   Outcome Measure  Help needed turning from your back to your side while in a flat bed without using bedrails?: A Little Help needed moving from lying on your back to sitting on the side of a flat bed without using bedrails?: A Little Help needed moving to and from a bed to a chair (including a wheelchair)?: A Little Help needed standing up from a chair using your arms (e.g., wheelchair or bedside chair)?: A Little Help needed to walk in hospital room?: A Little Help needed climbing 3-5 steps with a railing? : A Little 6 Click Score: 18    End of Session Equipment Utilized During Treatment: Gait belt;Back brace Activity Tolerance: Patient limited by pain Patient left: in chair;with call bell/phone within reach;with chair alarm set;with family/visitor present Nurse Communication: Mobility status PT Visit Diagnosis: Pain;Muscle weakness (generalized) (M62.81);Difficulty in walking, not elsewhere classified (R26.2) Pain - part of body:  (Low back)     Time: 1002-1040 PT Time Calculation (min) (ACUTE ONLY): 38 min  Charges:  $Gait Training: 8-22 mins $Therapeutic Exercise: 8-22 mins $Therapeutic Activity: 8-22 mins                     D. Scott Tynetta Bachmann PT, DPT 12/18/20, 2:02 PM

## 2020-12-18 NOTE — Progress Notes (Signed)
Progress Note   Date: 12/18/2020  Postop day #2 from L2-3, L3-4 lateral fusion, L2-3 pedicle screw fixation  Subjective: Patient continues to improve. She is tolerating a diet but denies flatus. She is not endorse any significant abdominal pain. Her nausea has resolved. She was working with physical therapy yesterday and was able to ambulate with assistance. She does continue to have some back pain but the medications have been helping. Her x-rays were completed.  Vital Signs: Temp:  [98 F (36.7 C)-100 F (37.8 C)] 98.2 F (36.8 C) (02/16 0528) Pulse Rate:  [81-93] 90 (02/16 0528) Resp:  [16-19] 16 (02/16 0528) BP: (143-172)/(57-67) 145/67 (02/16 0528) SpO2:  [96 %-100 %] 96 % (02/16 0528) Temp (24hrs), Avg:98.5 F (36.9 C), Min:98 F (36.7 C), Max:100 F (37.8 C)  Weight: 82.1 kg   Problem List Patient Active Problem List   Diagnosis Date Noted  . Lumbar stenosis 12/16/2020  . Benign hypertension with chronic kidney disease, stage III (HCC) 12/22/2018  . History of lumbar fusion 07/19/2018  . DDD (degenerative disc disease), lumbosacral 07/19/2018  . Obesity (BMI 30.0-34.9) 07/19/2018  . DOE (dyspnea on exertion) 07/11/2018  . Mild cognitive impairment 12/21/2017  . Aortic atherosclerosis (HCC) 05/15/2017  . Hyperglycemia 11/17/2016  . Osteopenia 05/15/2016  . Impingement syndrome of left shoulder 05/12/2016  . Insomnia 11/08/2015  . Angina pectoris (HCC) 07/10/2015  . Seasonal allergic rhinitis 07/09/2015  . GERD (gastroesophageal reflux disease) 07/09/2015  . Bee sting allergy 07/09/2015  . History of knee replacement procedure of right knee 04/29/2015  . History of colonic polyps 09/24/2014  . Fibrocystic breast 09/24/2014  . Primary osteoarthritis of right knee 06/12/2014  . Hyperlipidemia 02/09/2013  . Atherosclerosis of native coronary artery of native heart with stable angina pectoris (HCC) 02/09/2013  . Obstructive sleep apnea on CPAP 02/09/2013  .  Hypertension 02/09/2013    Medications: Scheduled Meds: . losartan  100 mg Oral Daily   And  . amLODipine  10 mg Oral Daily   And  . hydrochlorothiazide  12.5 mg Oral Daily  . vitamin C with rose hips  1,000 mg Oral Daily  . cholecalciferol  1,000 Units Oral BH-q7a  . docusate sodium  100 mg Oral BID  . enoxaparin (LOVENOX) injection  40 mg Subcutaneous Q24H  . ezetimibe  10 mg Oral Daily  . fluticasone  2 spray Each Nare QHS  . multivitamin with minerals   Oral BH-q7a  . pantoprazole  40 mg Oral Daily  . sodium chloride flush  3 mL Intravenous Q12H   Continuous Infusions: . sodium chloride    . sodium chloride Stopped (12/18/20 0548)  . methocarbamol (ROBAXIN) IV     PRN Meds:.acetaminophen **OR** acetaminophen, bisacodyl, HYDROmorphone (DILAUDID) injection, magnesium hydroxide, menthol-cetylpyridinium **OR** phenol, methocarbamol **OR** methocarbamol (ROBAXIN) IV, ondansetron **OR** ondansetron (ZOFRAN) IV, oxyCODONE, sodium chloride flush, traZODone  Labs:  Lab Results  Component Value Date   WBC 8.7 12/09/2020   WBC 6.7 04/15/2020   HCT 34.3 (L) 12/09/2020   HCT 32.8 (L) 04/15/2020   HCT 36.3 03/18/2014   PLT 295 12/09/2020   PLT 260 04/15/2020   PLT 212 03/18/2014    Lab Results  Component Value Date   INR 1.1 12/09/2020   APTT 32 12/09/2020    Lab Results  Component Value Date   NA 143 12/09/2020   NA 142 04/15/2020   NA 141 03/18/2014   K 3.4 (L) 12/09/2020   K 4.0 04/15/2020   K 3.6  03/18/2014   BUN 17 12/09/2020   BUN 15 04/15/2020   BUN 11 03/18/2014   No results found for: MG  Exam:  Awake, alert Left flank and back incision is dry and flat, dressings are clean 5 out of 5 strength in bilateral lower extremities throughout all muscle groups Sensation intact to light touch   Assessment/plan Postop day 2 from L2-3, L3-4 lateral interbody fusion, L2-3 pedicle screw fixation  -Physical and Occupational Therapy have evaluated: Recommend home  health PT/OT -Advance to soft diet -CT and lumbar spine x-rays performed showing adequate hardware position -Continue pain control, transition to oral pain medications -Foley removed, voiding -On DVT prophylaxis with Lovenox -Discharge planning ongoing   Lucy Chris, MD

## 2020-12-18 NOTE — Discharge Summary (Signed)
Physician Discharge Summary  Patient ID: Jodi Kirby MRN: [id number] DOB/AGE: Oct 07, 1946 75 y.o.  Admit date: 12/16/2020 Discharge date: 12/18/2020  Admission Diagnoses: Lumbar stenosis  Discharge Diagnoses:  Active Problems:   Lumbar stenosis   Discharged Condition: good  Hospital Course: Jodi Kirby was admitted to the hospital on 2/14 for the planned surgery of an L2-4 fusion. Surgery was without complications and the patient was taken to the postoperative area for recovery. Over the next 2 days, she was able to progress with activity including ambulation with physical therapy. She did have postoperative x-rays completed. She was tolerating a diet. Her pain was controlled with oral medications. On postop day 2, she was at her neurologic baseline and ready for discharge from a medical standpoint. She was discharged home with planned home therapy.   Discharge Exam: Blood pressure (!) 154/61, pulse 86, temperature 98.3 F (36.8 C), resp. rate 16, height 5\' 3"  (1.6 m), weight 82.1 kg, SpO2 99 %.  Awake, alert Left flank and back incision is dry and flat, dressings are clean 5 out of 5 strength in bilateral lower extremities throughout all muscle groups Sensation intact to light touch   Disposition: Home  Discharge Instructions    Incentive spirometry RT   Complete by: As directed      Allergies as of 12/18/2020      Reactions   Niaspan [niacin Er] Other (See Comments)   Patient states she can't remember the reaction because it so long ago.   Oxycodone-acetaminophen Nausea Only   Statins Other (See Comments)   Muscle and joint pain.      Medication List    STOP taking these medications   aspirin EC 81 MG tablet     TAKE these medications   acetaminophen 650 MG CR tablet Commonly known as: TYLENOL Take 650 mg by mouth daily as needed for pain.   alendronate 70 MG tablet Commonly known as: FOSAMAX TAKE 1 TABLET WEEKLY What changed: See the new  instructions.   CENTRUM SILVER PO Take 1 tablet by mouth every morning.   docusate sodium 100 MG capsule Commonly known as: COLACE Take 1 capsule (100 mg total) by mouth 2 (two) times daily.   EPINEPHrine 0.3 mg/0.3 mL Devi Commonly known as: EPI-PEN Inject 0.3 mg into the muscle as needed (anaphylaxis).   ezetimibe 10 MG tablet Commonly known as: ZETIA Take 1 tablet (10 mg total) by mouth daily.   fluticasone 50 MCG/ACT nasal spray Commonly known as: FLONASE USE TWO SPRAY(S) IN EACH NOSTRIL AT BEDTIME What changed:   how much to take  how to take this  when to take this   icosapent Ethyl 1 g capsule Commonly known as: Vascepa Take 2 capsules (2 g total) by mouth 2 (two) times daily.   methocarbamol 500 MG tablet Commonly known as: ROBAXIN Take 1 tablet (500 mg total) by mouth every 8 (eight) hours as needed for muscle spasms.   nitroGLYCERIN 0.4 MG SL tablet Commonly known as: NITROSTAT Place 1 tablet (0.4 mg total) under the tongue every 5 (five) minutes as needed for chest pain.   Olmesartan-amLODIPine-HCTZ 40-10-12.5 MG Tabs Take 1 tablet by mouth daily.   omeprazole 20 MG capsule Commonly known as: PRILOSEC Take 1 capsule (20 mg total) by mouth daily.   oxyCODONE 5 MG immediate release tablet Commonly known as: Oxy IR/ROXICODONE Take 1-2 tablets (5-10 mg total) by mouth every 3 (three) hours as needed for moderate pain (5 mg for 4-6/10 pain, 10  mg for 7-10/10).   polyethylene glycol 17 g packet Commonly known as: MIRALAX / GLYCOLAX Take 17 g by mouth daily.   traZODone 50 MG tablet Commonly known as: DESYREL Take 1 tablet (50 mg total) by mouth at bedtime as needed for sleep.   vitamin C with rose hips 1000 MG tablet Take 1,000 mg by mouth daily. With Zinc   Vitamin D3 25 MCG (1000 UT) Caps Take 1,000 Units by mouth every morning.        Signed: Lucy Chris 12/18/2020, 7:51 AM

## 2020-12-18 NOTE — Discharge Instructions (Signed)
NEUROSURGERY DISCHARGE INSTRUCTIONS  Admission diagnosis: Lumbar stenosis [M48.061]  Operative procedure: L2/3, L3/4 XLIF, L2-4 Pedicle Screws  What to do after you leave the hospital:  Recommended diet: regular diet. Increase protein intake to promote wound healing.  Recommended activity: no lifting, driving, or strenuous exercise for 6 weeks.You should walk multiple times per day. Please wear a brace when walking  Special Instructions  No straining, no heavy lifting > 10lbs x 6 weeks.  Keep incision area clean and dry. You may remove the dressings at home, there is no need to reapply a bandage. May shower tomorrow. No baths or pools for 6 weeks.   If there is no bowel movement in 2 days, please contact the office as you will need to consider a suppository or enema  You have no sutures to remove, the skin is closed with adhesive  Please take pain medications as directed. Take a stool softener if on pain medications   Please Report any of the following: Nausea or Vomiting, Temperature is greater than 101.73F (38.1C) degrees, Dizziness, Abdominal Pain, Difficulty Breathing or Shortness of Breath, Inability to Eat, drink Fluids, or Take medications, Bleeding, swelling, or drainage from surgical incision sites, New numbness or weakness, and Bowel or bladder dysfunction to the neurosurgeon on call at [phone]  Additional Follow up appointments Please follow up with Dr Adriana Simas in [place] [facility] as scheduled in 2-3 weeks   Please see below for scheduled appointments:  Future Appointments  Date Time Provider Department Center  04/04/2021  9:40 AM Alba Cory, MD [facility]  05/06/2021  8:00 AM [facility] - NURSE HEALTH ADVISOR [facility]

## 2020-12-19 ENCOUNTER — Encounter: Payer: Self-pay | Admitting: Neurosurgery

## 2020-12-19 DIAGNOSIS — Z4789 Encounter for other orthopedic aftercare: Secondary | ICD-10-CM | POA: Diagnosis not present

## 2020-12-19 DIAGNOSIS — R2689 Other abnormalities of gait and mobility: Secondary | ICD-10-CM | POA: Diagnosis not present

## 2020-12-19 DIAGNOSIS — M5416 Radiculopathy, lumbar region: Secondary | ICD-10-CM | POA: Diagnosis not present

## 2020-12-19 DIAGNOSIS — I2510 Atherosclerotic heart disease of native coronary artery without angina pectoris: Secondary | ICD-10-CM | POA: Diagnosis not present

## 2020-12-19 DIAGNOSIS — I10 Essential (primary) hypertension: Secondary | ICD-10-CM | POA: Diagnosis not present

## 2020-12-23 DIAGNOSIS — R2689 Other abnormalities of gait and mobility: Secondary | ICD-10-CM | POA: Diagnosis not present

## 2020-12-23 DIAGNOSIS — I2510 Atherosclerotic heart disease of native coronary artery without angina pectoris: Secondary | ICD-10-CM | POA: Diagnosis not present

## 2020-12-23 DIAGNOSIS — I10 Essential (primary) hypertension: Secondary | ICD-10-CM | POA: Diagnosis not present

## 2020-12-23 DIAGNOSIS — Z4789 Encounter for other orthopedic aftercare: Secondary | ICD-10-CM | POA: Diagnosis not present

## 2020-12-23 DIAGNOSIS — M5416 Radiculopathy, lumbar region: Secondary | ICD-10-CM | POA: Diagnosis not present

## 2020-12-24 DIAGNOSIS — I2510 Atherosclerotic heart disease of native coronary artery without angina pectoris: Secondary | ICD-10-CM | POA: Diagnosis not present

## 2020-12-24 DIAGNOSIS — M5416 Radiculopathy, lumbar region: Secondary | ICD-10-CM | POA: Diagnosis not present

## 2020-12-24 DIAGNOSIS — I10 Essential (primary) hypertension: Secondary | ICD-10-CM | POA: Diagnosis not present

## 2020-12-24 DIAGNOSIS — R2689 Other abnormalities of gait and mobility: Secondary | ICD-10-CM | POA: Diagnosis not present

## 2020-12-24 DIAGNOSIS — Z4789 Encounter for other orthopedic aftercare: Secondary | ICD-10-CM | POA: Diagnosis not present

## 2020-12-25 DIAGNOSIS — Z4789 Encounter for other orthopedic aftercare: Secondary | ICD-10-CM | POA: Diagnosis not present

## 2020-12-25 DIAGNOSIS — I10 Essential (primary) hypertension: Secondary | ICD-10-CM | POA: Diagnosis not present

## 2020-12-25 DIAGNOSIS — M5416 Radiculopathy, lumbar region: Secondary | ICD-10-CM | POA: Diagnosis not present

## 2020-12-25 DIAGNOSIS — I2510 Atherosclerotic heart disease of native coronary artery without angina pectoris: Secondary | ICD-10-CM | POA: Diagnosis not present

## 2020-12-25 DIAGNOSIS — R2689 Other abnormalities of gait and mobility: Secondary | ICD-10-CM | POA: Diagnosis not present

## 2021-01-23 ENCOUNTER — Other Ambulatory Visit: Payer: Self-pay | Admitting: Family Medicine

## 2021-01-23 DIAGNOSIS — M8580 Other specified disorders of bone density and structure, unspecified site: Secondary | ICD-10-CM

## 2021-01-23 NOTE — Telephone Encounter (Signed)
Requested Prescriptions  Pending Prescriptions Disp Refills  . alendronate (FOSAMAX) 70 MG tablet [Pharmacy Med Name: ALENDRONATE  TAB 70MG ] 12 tablet 2    Sig: TAKE 1 TABLET WEEKLY     Endocrinology:  Bisphosphonates Passed - 01/23/2021  2:22 AM      Passed - Ca in normal range and within 360 days    Calcium  Date Value Ref Range Status  12/09/2020 9.4 8.9 - 10.3 mg/dL Final   Calcium, Total  Date Value Ref Range Status  03/18/2014 8.9 8.5 - 10.1 mg/dL Final         Passed - Vitamin D in normal range and within 360 days    Vit D, 25-Hydroxy  Date Value Ref Range Status  04/15/2020 38 30 - 100 ng/mL Final    Comment:    Vitamin D Status         25-OH Vitamin D: . Deficiency:                    <20 ng/mL Insufficiency:             20 - 29 ng/mL Optimal:                 > or = 30 ng/mL . For 25-OH Vitamin D testing on patients on  D2-supplementation and patients for whom quantitation  of D2 and D3 fractions is required, the [employer]ure[employer](TM) 25-OH VIT D, (D2,D3), LC/MS/MS is recommended: order  code 04/17/2020 (patients >8yrs). See Note 1 . Note 1 . For additional information, please refer to  http://education.Quest[facility].com/faq/FAQ199  (This link is being provided for informational/ educational purposes only.)          Passed - Valid encounter within last 12 months    Recent Outpatient Visits          3 months ago Osteopenia after menopause   [facility] [place], Leugnies, MD   9 months ago Benign hypertension with chronic kidney disease, stage II   [facility] [place], Leugnies, MD   1 year ago Atherosclerosis of native coronary artery of native heart with stable angina pectoris [facility])   [facility] [place], Leugnies, MD   1 year ago Atherosclerosis of native coronary artery of native heart with stable angina pectoris [facility])   [facility], MD   2 years ago Essential  hypertension   [facility], MD      Future Appointments            In 2 months Al[facility] Cory, Carlynn Purl, MD [facility]   In 3 months  [facility], [facility]

## 2021-02-04 DIAGNOSIS — M4326 Fusion of spine, lumbar region: Secondary | ICD-10-CM | POA: Diagnosis not present

## 2021-02-04 DIAGNOSIS — Z981 Arthrodesis status: Secondary | ICD-10-CM | POA: Diagnosis not present

## 2021-04-04 ENCOUNTER — Ambulatory Visit (INDEPENDENT_AMBULATORY_CARE_PROVIDER_SITE_OTHER): Payer: Medicare HMO | Admitting: Family Medicine

## 2021-04-04 ENCOUNTER — Other Ambulatory Visit: Payer: Self-pay

## 2021-04-04 ENCOUNTER — Encounter: Payer: Self-pay | Admitting: Family Medicine

## 2021-04-04 VITALS — BP 132/78 | HR 74 | Temp 98.3°F | Resp 16 | Ht 63.0 in | Wt 179.0 lb

## 2021-04-04 DIAGNOSIS — T466X5A Adverse effect of antihyperlipidemic and antiarteriosclerotic drugs, initial encounter: Secondary | ICD-10-CM

## 2021-04-04 DIAGNOSIS — I209 Angina pectoris, unspecified: Secondary | ICD-10-CM

## 2021-04-04 DIAGNOSIS — N183 Chronic kidney disease, stage 3 unspecified: Secondary | ICD-10-CM

## 2021-04-04 DIAGNOSIS — D638 Anemia in other chronic diseases classified elsewhere: Secondary | ICD-10-CM

## 2021-04-04 DIAGNOSIS — K219 Gastro-esophageal reflux disease without esophagitis: Secondary | ICD-10-CM | POA: Diagnosis not present

## 2021-04-04 DIAGNOSIS — Z9989 Dependence on other enabling machines and devices: Secondary | ICD-10-CM

## 2021-04-04 DIAGNOSIS — G4733 Obstructive sleep apnea (adult) (pediatric): Secondary | ICD-10-CM | POA: Diagnosis not present

## 2021-04-04 DIAGNOSIS — I129 Hypertensive chronic kidney disease with stage 1 through stage 4 chronic kidney disease, or unspecified chronic kidney disease: Secondary | ICD-10-CM | POA: Diagnosis not present

## 2021-04-04 DIAGNOSIS — M816 Localized osteoporosis [Lequesne]: Secondary | ICD-10-CM

## 2021-04-04 DIAGNOSIS — E785 Hyperlipidemia, unspecified: Secondary | ICD-10-CM

## 2021-04-04 DIAGNOSIS — M791 Myalgia, unspecified site: Secondary | ICD-10-CM

## 2021-04-04 DIAGNOSIS — R109 Unspecified abdominal pain: Secondary | ICD-10-CM | POA: Diagnosis not present

## 2021-04-04 DIAGNOSIS — I25118 Atherosclerotic heart disease of native coronary artery with other forms of angina pectoris: Secondary | ICD-10-CM

## 2021-04-04 DIAGNOSIS — R739 Hyperglycemia, unspecified: Secondary | ICD-10-CM | POA: Diagnosis not present

## 2021-04-04 DIAGNOSIS — E559 Vitamin D deficiency, unspecified: Secondary | ICD-10-CM

## 2021-04-04 DIAGNOSIS — G4700 Insomnia, unspecified: Secondary | ICD-10-CM

## 2021-04-04 DIAGNOSIS — N182 Chronic kidney disease, stage 2 (mild): Secondary | ICD-10-CM

## 2021-04-04 DIAGNOSIS — I700 Atherosclerosis of aorta: Secondary | ICD-10-CM

## 2021-04-04 MED ORDER — OMEPRAZOLE 20 MG PO CPDR: 20.0000 mg | DELAYED_RELEASE_CAPSULE | Freq: Every day | ORAL | 1 refills | Status: DC

## 2021-04-04 MED ORDER — TRAZODONE HCL 50 MG PO TABS
50.0000 mg | ORAL_TABLET | Freq: Every evening | ORAL | 1 refills | Status: DC | PRN
Start: 2021-04-04 — End: 2021-12-29

## 2021-04-04 NOTE — Progress Notes (Signed)
Name: Jodi Kirby   MRN: [id number]    DOB: [birth year]   Date:04/04/2021       Progress Note  Subjective  Chief Complaint  Follow Up  HPI  Periumbilical pain: she states she noticed intermittent pain for the past couple weeks, pain is described as sharp, unrelated to meals or activity, no change in bowel movements, no blood in stools. She states when she applies pressure to her abdomen pain seems to radiate to her back . She had spinal fusion Feb 2022 . No fever or chills. No nausea or vomiting. [place] 4  OSA: she usually wears her CPAP every night, she wakes up feeling tired most of the time. She takes Trazodone for insomnia, she states has nocturia .  Insomnia: she does not take it daily, she states when she takes medication she is able to fall and stay asleep, she can sleep at least 7 hours on medication   DDD lumbar spine:  History of spinal fusion and L3-4 .and revision in Feb 2022, she states surgery seems to have worked, she states pain is finally getting better, able to get up and walk now. She states back pain while sitting is down to 3-4/10, still goes up to 7/10 when she first stands up but goes down after activity is started   IMPRESSION: 10/09/2020  1. Mildly progressive disc degeneration at L2-3 with moderate spinal stenosis and mild-to-moderate left greater than right neural foraminal and lateral recess stenosis. 2. Postoperative changes at L3-4 and L4-5 with unchanged residual spinal and neural foraminal stenosis at L3-4.  Knee replacement surgery right: surgery was June 27 th, 2016, she has chronic right knee pain, she states no recent episode of effusion except for on the right side - since surgery and stable   HTNwith CKI:she is back on one pill Tribenzor  no recent episodes of chest pain or palpitation, denies orthopnea . BP has been at goal here and also at home. Denies dizziness   Hyperlipidemia/aorta atherosclerosis: unable to tolerate statins  because it causes myopahty Dr. Epifanio Lesches her on Zetia 07/2018 and is tolerating it well, however LDL not at goal ( 107 LDL) , we gave her vascepa on her last visit but she did not fill it because of cost   GERD: under control with Omeprazole in am'sand symptoms have been well controlled.No heartburn, no regurgitation, no change in bowel movements    Angina Pectoris: she has CAD, mild disease, seeing Dr. Mariah Milling.Taking aspirin and ARB, not on beta-blocker because of bradycardia and not on statin because of intolerance, but is now on Zetiaand last LDL 107. She states she has intermittent chest pain but not recently, she has NTG at home. Goal LDL is below 70  Osteoporosis : diagnosed in 2016 - and was started on Fosamax,previous bone density in 2018, no side effects of medication.-2.4 spine -2.2 of femur, she had repeat study at [facility] 10/2020 and showed T score spine of -2.8 and femur -2.1 . Discussed other medications or referral to endo. She would like to discuss infusions with Endo   Hyperglycemia: she denies polyphagia, polydipsia or polyuria. A1C was 5.5% to 5.7% last A1C 5.4 % one year ago, we will recheck it today   Mild cognitive dysfunction:CIT was 8 in 2020  she has been doingcross work puzzles, word search and has been doing well. Recheck on yearly exam    Patient Active Problem List   Diagnosis Date Noted  . Lumbar stenosis 12/16/2020  . Benign hypertension  with chronic kidney disease, stage III (HCC) 12/22/2018  . History of lumbar fusion 07/19/2018  . DDD (degenerative disc disease), lumbosacral 07/19/2018  . Obesity (BMI 30.0-34.9) 07/19/2018  . DOE (dyspnea on exertion) 07/11/2018  . Mild cognitive impairment 12/21/2017  . Aortic atherosclerosis (HCC) 05/15/2017  . Hyperglycemia 11/17/2016  . Osteopenia 05/15/2016  . Impingement syndrome of left shoulder 05/12/2016  . Insomnia 11/08/2015  . Angina pectoris (HCC) 07/10/2015  . Seasonal allergic rhinitis  07/09/2015  . GERD (gastroesophageal reflux disease) 07/09/2015  . Bee sting allergy 07/09/2015  . History of knee replacement procedure of right knee 04/29/2015  . History of colonic polyps 09/24/2014  . Fibrocystic breast 09/24/2014  . Primary osteoarthritis of right knee 06/12/2014  . Hyperlipidemia 02/09/2013  . Atherosclerosis of native coronary artery of native heart with stable angina pectoris (HCC) 02/09/2013  . Obstructive sleep apnea on CPAP 02/09/2013  . Hypertension 02/09/2013    Past Surgical History:  Procedure Laterality Date  . ABDOMINAL HYSTERECTOMY  1970  . ANKLE SURGERY Right 1980  . ANTERIOR LATERAL LUMBAR FUSION WITH PERCUTANEOUS SCREW 2 LEVEL N/A 12/16/2020   Procedure: L2/3, L3/4 LATERAL LUMBAR FUSION, L2-4 PEDICLE SCREW FIXATION;  Surgeon: Lucy Chris, MD;  Location: [facility] ORS;  Service: Neurosurgery;  Laterality: N/A;  . BLADDER SURGERY  2012  . CARDIAC CATHETERIZATION  2003   [place]  . CARDIAC CATHETERIZATION     [place]  . CATARACT EXTRACTION W/PHACO Right 02/09/2018   Procedure: CATARACT EXTRACTION PHACO AND INTRAOCULAR LENS PLACEMENT (IOC) RIGHT;  Surgeon: Lockie Mola, MD;  Location: [facility];  Service: Ophthalmology;  Laterality: Right;  sleep apnea  . CATARACT EXTRACTION W/PHACO Left 03/09/2018   Procedure: CATARACT EXTRACTION PHACO AND INTRAOCULAR LENS PLACEMENT (IOC);  Surgeon: Lockie Mola, MD;  Location[facility];  Service: Ophthalmology;  Laterality: Left;  IVA TOPICALLEFT  . COLONOSCOPY  2008, 2015   Dr. Servando Snare  . CYSTOSTOMY    . KNEE SURGERY  08/18/2011   arthroscopic right  . LUMBAR FUSION     L4-5, S-1  . NECK SURGERY  2003  . TOTAL KNEE ARTHROPLASTY Right 04/29/2015   Procedure: TOTAL KNEE ARTHROPLASTY;  Surgeon: Erin Sons, MD;  Location: [facility] ORS;  Service: Orthopedics;  Laterality: Right;  . TUBAL LIGATION      Family History  Problem Relation Age of Onset  . Kidney disease Mother   .  Hypertension Mother   . CAD Father   . Healthy Sister   . Kidney cancer Neg Hx   . Bladder Cancer Neg Hx     Social History   Tobacco Use  . Smoking status: Never Smoker  . Smokeless tobacco: Never Used  . Tobacco comment: smoking cessation materials not required  Substance Use Topics  . Alcohol use: No    Alcohol/week: 0.0 standard drinks     Current Outpatient Medications:  .  acetaminophen (TYLENOL) 650 MG CR tablet, Take 650 mg by mouth daily as needed for pain., Disp: , Rfl:  .  alendronate (FOSAMAX) 70 MG tablet, TAKE 1 TABLET WEEKLY, Disp: 12 tablet, Rfl: 2 .  Ascorbic Acid (VITAMIN C WITH ROSE HIPS) 1000 MG tablet, Take 1,000 mg by mouth daily. With Zinc, Disp: , Rfl:  .  Cholecalciferol (VITAMIN D3) 1000 UNITS CAPS, Take 1,000 Units by mouth every morning. , Disp: , Rfl:  .  docusate sodium (COLACE) 100 MG capsule, Take 1 capsule (100 mg total) by mouth 2 (two) times daily., Disp: 10 capsule, Rfl:  0 .  EPINEPHrine (EPI-PEN) 0.3 mg/0.3 mL DEVI, Inject 0.3 mg into the muscle as needed (anaphylaxis)., Disp: , Rfl:  .  ezetimibe (ZETIA) 10 MG tablet, Take 1 tablet (10 mg total) by mouth daily., Disp: 90 tablet, Rfl: 3 .  fluticasone (FLONASE) 50 MCG/ACT nasal spray, USE TWO SPRAY(S) IN EACH NOSTRIL AT BEDTIME (Patient taking differently: Place 2 sprays into both nostrils at bedtime. USE TWO SPRAY(S) IN EACH NOSTRIL AT BEDTIME), Disp: 48 g, Rfl: 1 .  icosapent Ethyl (VASCEPA) 1 g capsule, Take 2 capsules (2 g total) by mouth 2 (two) times daily., Disp: 360 capsule, Rfl: 1 .  Multiple Vitamins-Minerals (CENTRUM SILVER PO), Take 1 tablet by mouth every morning. , Disp: , Rfl:  .  nitroGLYCERIN (NITROSTAT) 0.4 MG SL tablet, Place 1 tablet (0.4 mg total) under the tongue every 5 (five) minutes as needed for chest pain., Disp: 25 tablet, Rfl: 0 .  Olmesartan-amLODIPine-HCTZ 40-10-12.5 MG TABS, Take 1 tablet by mouth daily., Disp: 90 tablet, Rfl: 3 .  omeprazole (PRILOSEC) 20 MG  capsule, Take 1 capsule (20 mg total) by mouth daily., Disp: 90 capsule, Rfl: 1 .  oxyCODONE (OXY IR/ROXICODONE) 5 MG immediate release tablet, Take 1-2 tablets (5-10 mg total) by mouth every 3 (three) hours as needed for moderate pain (5 mg for 4-6/10 pain, 10 mg for 7-10/10)., Disp: 30 tablet, Rfl: 0 .  polyethylene glycol (MIRALAX / GLYCOLAX) 17 g packet, Take 17 g by mouth daily., Disp: 14 each, Rfl: 0 .  traZODone (DESYREL) 50 MG tablet, Take 1 tablet (50 mg total) by mouth at bedtime as needed for sleep., Disp: 90 tablet, Rfl: 1 .  methocarbamol (ROBAXIN) 500 MG tablet, Take 1 tablet (500 mg total) by mouth every 8 (eight) hours as needed for muscle spasms. (Patient not taking: Reported on 04/04/2021), Disp: 60 tablet, Rfl: 1  Allergies  Allergen Reactions  . Niaspan [Niacin Er] Other (See Comments)    Patient states she can't remember the reaction because it so long ago.  . Oxycodone-Acetaminophen Nausea Only  . Statins Other (See Comments)    Muscle and joint pain.    I personally reviewed active problem list, medication list, allergies, family history, social history, health maintenance, notes from last encounter with the patient/caregiver today.   ROS  Constitutional: Negative for fever or weight change.  Respiratory: Negative for cough and shortness of breath.   Cardiovascular: Negative for chest pain or palpitations.  Gastrointestinal: Positive for abdominal pain, no bowel changes.  Musculoskeletal: positive  for gait problem and intermittent right knee  joint swelling.  Skin: Negative for rash.  Neurological: Negative for dizziness or headache.  No other specific complaints in a complete review of systems (except as listed in HPI above).  Objective  Vitals:   04/04/21 0922  BP: 132/78  Pulse: 74  Resp: 16  Temp: 98.3 F (36.8 C)  TempSrc: Oral  SpO2: 99%  Weight: 179 lb (81.2 kg)  Height: 5\' 3"  (1.6 m)    Body mass index is 31.71 kg/m.  Physical  Exam  Constitutional: Patient appears well-developed and well-nourished. Obese  No distress.  HEENT: head atraumatic, normocephalic, pupils equal and reactive to light,  neck supple Cardiovascular: Normal rate, regular rhythm and normal heart sounds.  No murmur heard. No BLE edema. Pulmonary/Chest: Effort normal and breath sounds normal. No respiratory distress. Abdominal: Soft.  There is  Tenderness on Left lower quadrant ( she states since surgery) also some pan during palpation of  left upper quadrant . Psychiatric: Patient has a normal mood and affect. behavior is normal. Judgment and thought content normal. Muscular skeletal: difficulty getting up from chair, incision on left flank healing well   PHQ2/9: Depression screen [facility] 2/9 04/04/2021 10/15/2020 05/02/2020 04/15/2020 10/16/2019  Decreased Interest 0 0 0 0 0  Down, Depressed, Hopeless 0 0 0 0 0  PHQ - 2 Score 0 0 0 0 0  Altered sleeping - - - 0 0  Tired, decreased energy - - - 0 0  Change in appetite - - - 0 0  Feeling bad or failure about yourself  - - - 0 0  Trouble concentrating - - - 0 0  Moving slowly or fidgety/restless - - - 0 0  Suicidal thoughts - - - 0 0  PHQ-9 Score - - - 0 0  Difficult doing work/chores - - - - -    phq 9 is negative   Fall Risk: Fall Risk  04/04/2021 10/15/2020 05/02/2020 04/15/2020 10/16/2019  Falls in the past year? 0 0 0 - 0  Number falls in past yr: 0 0 0 0 0  Injury with Fall? 0 0 0 0 0  Risk for fall due to : - - No Fall Risks - -  Follow up - - Falls prevention discussed - -    Functional Status Survey: Is the patient deaf or have difficulty hearing?: No Does the patient have difficulty seeing, even when wearing glasses/contacts?: Yes Does the patient have difficulty concentrating, remembering, or making decisions?: No Does the patient have difficulty walking or climbing stairs?: No Does the patient have difficulty dressing or bathing?: No Does the patient have difficulty doing errands  alone such as visiting a doctor's office or shopping?: No    Assessment & Plan  1. Abdominal pain, unspecified abdominal location  - Lipase  2. Dyslipidemia  - Lipid panel  3. Benign hypertension with chronic kidney disease, stage II   4. Gastroesophageal reflux disease without esophagitis  - omeprazole (PRILOSEC) 20 MG capsule; Take 1 capsule (20 mg total) by mouth daily.  Dispense: 90 capsule; Refill: 1  5. Aortic atherosclerosis (HCC)   6. Angina pectoris (HCC)   7. Osteopenia after menopause   8. Obstructive sleep apnea on CPAP   9. Atherosclerosis of native coronary artery of native heart with stable angina pectoris (HCC)  - Lipid panel  10. Anemia of chronic disease  - CBC with Differential/Platelet  11. Hyperglycemia  - Hemoglobin A1c  12. Benign hypertension with chronic kidney disease, stage III (HCC)  - CBC with Differential/Platelet - COMPLETE METABOLIC PANEL WITH GFR  13. Vitamin D deficiency   14. Myalgia due to statin   15. Insomnia, unspecified type  - traZODone (DESYREL) 50 MG tablet; Take 1 tablet (50 mg total) by mouth at bedtime as needed for sleep.  Dispense: 90 tablet; Refill: 1  16. Localized osteoporosis without current pathological fracture  Referral endo

## 2021-04-05 LAB — LIPID PANEL
Cholesterol: 201 mg/dL — ABNORMAL HIGH (ref <200)
HDL: 53 mg/dL (ref >=50)
LDL Cholesterol (Calc): 121 mg/dL (calc) — ABNORMAL HIGH
Non-HDL Cholesterol (Calc): 148 mg/dL (calc) — ABNORMAL HIGH (ref <130)
Total CHOL/HDL Ratio: 3.8 (calc) (ref <5.0)
Triglycerides: 159 mg/dL — ABNORMAL HIGH (ref <150)

## 2021-04-05 LAB — COMPLETE METABOLIC PANEL WITH GFR
AG Ratio: 1.8 (calc) (ref 1.0–2.5)
ALT: 18 U/L (ref 6–29)
AST: 13 U/L (ref 10–35)
Albumin: 4.4 g/dL (ref 3.6–5.1)
Alkaline phosphatase (APISO): 96 U/L (ref 37–153)
BUN/Creatinine Ratio: 13 (calc) (ref 6–22)
BUN: 14 mg/dL (ref 7–25)
CO2: 26 mmol/L (ref 20–32)
Calcium: 9.8 mg/dL (ref 8.6–10.4)
Chloride: 106 mmol/L (ref 98–110)
Creat: 1.11 mg/dL — ABNORMAL HIGH (ref 0.60–0.93)
GFR, Est African American: 56 mL/min/{1.73_m2} — ABNORMAL LOW (ref >=60)
GFR, Est Non African American: 49 mL/min/{1.73_m2} — ABNORMAL LOW (ref >=60)
Globulin: 2.5 g/dL (calc) (ref 1.9–3.7)
Glucose, Bld: 100 mg/dL — ABNORMAL HIGH (ref 65–99)
Potassium: 3.7 mmol/L (ref 3.5–5.3)
Sodium: 144 mmol/L (ref 135–146)
Total Bilirubin: 0.3 mg/dL (ref 0.2–1.2)
Total Protein: 6.9 g/dL (ref 6.1–8.1)

## 2021-04-05 LAB — CBC WITH DIFFERENTIAL/PLATELET
Absolute Monocytes: 529 cells/uL (ref 200–950)
Basophils Absolute: 27 cells/uL (ref 0–200)
Basophils Relative: 0.4 %
Eosinophils Absolute: 47 cells/uL (ref 15–500)
Eosinophils Relative: 0.7 %
HCT: 32.5 % — ABNORMAL LOW (ref 35.0–45.0)
Hemoglobin: 10.2 g/dL — ABNORMAL LOW (ref 11.7–15.5)
Lymphs Abs: 1755 cells/uL (ref 850–3900)
MCH: 26.6 pg — ABNORMAL LOW (ref 27.0–33.0)
MCHC: 31.4 g/dL — ABNORMAL LOW (ref 32.0–36.0)
MCV: 84.6 fL (ref 80.0–100.0)
MPV: 10.8 fL (ref 7.5–12.5)
Monocytes Relative: 7.9 %
Neutro Abs: 4342 cells/uL (ref 1500–7800)
Neutrophils Relative %: 64.8 %
Platelets: 278 10*3/uL (ref 140–400)
RBC: 3.84 10*6/uL (ref 3.80–5.10)
RDW: 13 % (ref 11.0–15.0)
Total Lymphocyte: 26.2 %
WBC: 6.7 10*3/uL (ref 3.8–10.8)

## 2021-04-05 LAB — LIPASE: Lipase: 14 U/L (ref 7–60)

## 2021-04-05 LAB — HEMOGLOBIN A1C
Hgb A1c MFr Bld: 5.6 % of total Hgb (ref <5.7)
Mean Plasma Glucose: 114 mg/dL
eAG (mmol/L): 6.3 mmol/L

## 2021-04-29 DIAGNOSIS — M4326 Fusion of spine, lumbar region: Secondary | ICD-10-CM | POA: Diagnosis not present

## 2021-04-29 DIAGNOSIS — Z981 Arthrodesis status: Secondary | ICD-10-CM | POA: Diagnosis not present

## 2021-05-06 ENCOUNTER — Ambulatory Visit: Payer: Medicare PPO

## 2021-05-07 DIAGNOSIS — G4733 Obstructive sleep apnea (adult) (pediatric): Secondary | ICD-10-CM | POA: Diagnosis not present

## 2021-05-13 ENCOUNTER — Ambulatory Visit (INDEPENDENT_AMBULATORY_CARE_PROVIDER_SITE_OTHER): Payer: Medicare HMO

## 2021-05-13 DIAGNOSIS — Z0000 Encounter for general adult medical examination without abnormal findings: Secondary | ICD-10-CM

## 2021-05-13 NOTE — Patient Instructions (Signed)
Ms. Oliger , Thank you for taking time to come for your Medicare Wellness Visit. I appreciate your ongoing commitment to your health goals. Please review the following plan we discussed and let me know if I can assist you in the future.   Screening recommendations/referrals: Colonoscopy: done 12/14/13. Repeat in 2025 Mammogram: done 10/18/20 Bone Density: done 10/18/20 Recommended yearly ophthalmology/optometry visit for glaucoma screening and checkup Recommended yearly dental visit for hygiene and checkup  Vaccinations: Influenza vaccine: declined Pneumococcal vaccine: done 12/05/14 Tdap vaccine: due Shingles vaccine: Shingrix discussed. Please contact your pharmacy for coverage information.  Covid-19: done 08/07/20; due for booster  Advanced directives: Advance directive discussed with you today. I have provided a copy for you to complete at home and have notarized. Once this is complete please bring a copy in to our office so we can scan it into your chart.   Conditions/risks identified: Recommend physical activity as tolerated  Next appointment: Follow up in one year for your annual wellness visit    Preventive Care 65 Years and Older, Female Preventive care refers to lifestyle choices and visits with your health care provider that can promote health and wellness. What does preventive care include? A yearly physical exam. This is also called an annual well check. Dental exams once or twice a year. Routine eye exams. Ask your health care provider how often you should have your eyes checked. Personal lifestyle choices, including: Daily care of your teeth and gums. Regular physical activity. Eating a healthy diet. Avoiding tobacco and drug use. Limiting alcohol use. Practicing safe sex. Taking low-dose aspirin every day. Taking vitamin and mineral supplements as recommended by your health care provider. What happens during an annual well check? The services and screenings done  by your health care provider during your annual well check will depend on your age, overall health, lifestyle risk factors, and family history of disease. Counseling  Your health care provider may ask you questions about your: Alcohol use. Tobacco use. Drug use. Emotional well-being. Home and relationship well-being. Sexual activity. Eating habits. History of falls. Memory and ability to understand (cognition). Work and work Astronomer. Reproductive health. Screening  You may have the following tests or measurements: Height, weight, and BMI. Blood pressure. Lipid and cholesterol levels. These may be checked every 5 years, or more frequently if you are over 74 years old. Skin check. Lung cancer screening. You may have this screening every year starting at age 63 if you have a 30-pack-year history of smoking and currently smoke or have quit within the past 15 years. Fecal occult blood test (FOBT) of the stool. You may have this test every year starting at age 22. Flexible sigmoidoscopy or colonoscopy. You may have a sigmoidoscopy every 5 years or a colonoscopy every 10 years starting at age 14. Hepatitis C blood test. Hepatitis B blood test. Sexually transmitted disease (STD) testing. Diabetes screening. This is done by checking your blood sugar (glucose) after you have not eaten for a while (fasting). You may have this done every 1-3 years. Bone density scan. This is done to screen for osteoporosis. You may have this done starting at age 75. Mammogram. This may be done every 1-2 years. Talk to your health care provider about how often you should have regular mammograms. Talk with your health care provider about your test results, treatment options, and if necessary, the need for more tests. Vaccines  Your health care provider may recommend certain vaccines, such as: Influenza vaccine. This is recommended  every year. Tetanus, diphtheria, and acellular pertussis (Tdap, Td) vaccine. You  may need a Td booster every 10 years. Zoster vaccine. You may need this after age 81. Pneumococcal 13-valent conjugate (PCV13) vaccine. One dose is recommended after age 74. Pneumococcal polysaccharide (PPSV23) vaccine. One dose is recommended after age 48. Talk to your health care provider about which screenings and vaccines you need and how often you need them. This information is not intended to replace advice given to you by your health care provider. Make sure you discuss any questions you have with your health care provider. Document Released: 11/15/2015 Document Revised: 07/08/2016 Document Reviewed: 08/20/2015 Elsevier Interactive Patient Education  2017 [place] Prevention in the Home Falls can cause injuries. They can happen to people of all ages. There are many things you can do to make your home safe and to help prevent falls. What can I do on the outside of my home? Regularly fix the edges of walkways and driveways and fix any cracks. Remove anything that might make you trip as you walk through a door, such as a raised step or threshold. Trim any bushes or trees on the path to your home. Use bright outdoor lighting. Clear any walking paths of anything that might make someone trip, such as rocks or tools. Regularly check to see if handrails are loose or broken. Make sure that both sides of any steps have handrails. Any raised decks and porches should have guardrails on the edges. Have any leaves, snow, or ice cleared regularly. Use sand or salt on walking paths during winter. Clean up any spills in your garage right away. This includes oil or grease spills. What can I do in the bathroom? Use night lights. Install grab bars by the toilet and in the tub and shower. Do not use towel bars as grab bars. Use non-skid mats or decals in the tub or shower. If you need to sit down in the shower, use a plastic, non-slip stool. Keep the floor dry. Clean up any water that spills  on the floor as soon as it happens. Remove soap buildup in the tub or shower regularly. Attach bath mats securely with double-sided non-slip rug tape. Do not have throw rugs and other things on the floor that can make you trip. What can I do in the bedroom? Use night lights. Make sure that you have a light by your bed that is easy to reach. Do not use any sheets or blankets that are too big for your bed. They should not hang down onto the floor. Have a firm chair that has side arms. You can use this for support while you get dressed. Do not have throw rugs and other things on the floor that can make you trip. What can I do in the kitchen? Clean up any spills right away. Avoid walking on wet floors. Keep items that you use a lot in easy-to-reach places. If you need to reach something above you, use a strong step stool that has a grab bar. Keep electrical cords out of the way. Do not use floor polish or wax that makes floors slippery. If you must use wax, use non-skid floor wax. Do not have throw rugs and other things on the floor that can make you trip. What can I do with my stairs? Do not leave any items on the stairs. Make sure that there are handrails on both sides of the stairs and use them. Fix handrails that are broken  or loose. Make sure that handrails are as long as the stairways. Check any carpeting to make sure that it is firmly attached to the stairs. Fix any carpet that is loose or worn. Avoid having throw rugs at the top or bottom of the stairs. If you do have throw rugs, attach them to the floor with carpet tape. Make sure that you have a light switch at the top of the stairs and the bottom of the stairs. If you do not have them, ask someone to add them for you. What else can I do to help prevent falls? Wear shoes that: Do not have high heels. Have rubber bottoms. Are comfortable and fit you well. Are closed at the toe. Do not wear sandals. If you use a stepladder: Make  sure that it is fully opened. Do not climb a closed stepladder. Make sure that both sides of the stepladder are locked into place. Ask someone to hold it for you, if possible. Clearly mark and make sure that you can see: Any grab bars or handrails. First and last steps. Where the edge of each step is. Use tools that help you move around (mobility aids) if they are needed. These include: Canes. Walkers. Scooters. Crutches. Turn on the lights when you go into a dark area. Replace any light bulbs as soon as they burn out. Set up your furniture so you have a clear path. Avoid moving your furniture around. If any of your floors are uneven, fix them. If there are any pets around you, be aware of where they are. Review your medicines with your doctor. Some medicines can make you feel dizzy. This can increase your chance of falling. Ask your doctor what other things that you can do to help prevent falls. This information is not intended to replace advice given to you by your health care provider. Make sure you discuss any questions you have with your health care provider. Document Released: 08/15/2009 Document Revised: 03/26/2016 Document Reviewed: 11/23/2014 Elsevier Interactive Patient Education  2017 [employer].

## 2021-05-13 NOTE — Progress Notes (Signed)
Subjective:   Jodi Kirby is a 75 y.o. female who presents for Medicare Annual (Subsequent) preventive examination.  Virtual Visit via Telephone Note  I connected with  Jodi Kirby on 05/13/21 at  8:00 AM EDT by telephone and verified that I am speaking with the correct person using two identifiers.  Location: Patient: home Provider: [facility] Persons participating in the virtual visit: patient/Nurse Health Advisor   I discussed the limitations, risks, security and privacy concerns of performing an evaluation and management service by telephone and the availability of in person appointments. The patient expressed understanding and agreed to proceed.  Interactive audio and video telecommunications were attempted between this nurse and patient, however failed, due to patient having technical difficulties OR patient did not have access to video capability.  We continued and completed visit with audio only.  Some vital signs may be absent or patient reported.   Reather Littler, LPN   Review of Systems     Cardiac Risk Factors include: advanced age (>68men, >64 women);hypertension;dyslipidemia;obesity (BMI >30kg/m2)     Objective:    Today's Vitals   05/13/21 0811  PainSc: 6    There is no height or weight on file to calculate BMI.  Advanced Directives 05/13/2021 12/16/2020 12/09/2020 05/02/2020 05/02/2019 03/09/2018 02/09/2018  Does Patient Have a Medical Advance Directive? No No No No No No No  Would patient like information on creating a medical advance directive? Yes (MAU/Ambulatory/Procedural Areas - Information given) No - Patient declined No - Patient declined No - Patient declined Yes (MAU/Ambulatory/Procedural Areas - Information given) No - Patient declined No - Patient declined    Current Medications (verified) Outpatient Encounter Medications as of 05/13/2021  Medication Sig   acetaminophen (TYLENOL) 650 MG CR tablet Take 650 mg by mouth daily as needed for pain.    alendronate (FOSAMAX) 70 MG tablet TAKE 1 TABLET WEEKLY   Ascorbic Acid (VITAMIN C WITH ROSE HIPS) 1000 MG tablet Take 1,000 mg by mouth daily. With Zinc   Cholecalciferol (VITAMIN D3) 1000 UNITS CAPS Take 1,000 Units by mouth every morning.    docusate sodium (COLACE) 100 MG capsule Take 1 capsule (100 mg total) by mouth 2 (two) times daily.   EPINEPHrine (EPI-PEN) 0.3 mg/0.3 mL DEVI Inject 0.3 mg into the muscle as needed (anaphylaxis).   ezetimibe (ZETIA) 10 MG tablet Take 1 tablet (10 mg total) by mouth daily.   fluticasone (FLONASE) 50 MCG/ACT nasal spray USE TWO SPRAY(S) IN EACH NOSTRIL AT BEDTIME (Patient taking differently: Place 2 sprays into both nostrils at bedtime. USE TWO SPRAY(S) IN EACH NOSTRIL AT BEDTIME)   Multiple Vitamins-Minerals (CENTRUM SILVER PO) Take 1 tablet by mouth every morning.    nitroGLYCERIN (NITROSTAT) 0.4 MG SL tablet Place 1 tablet (0.4 mg total) under the tongue every 5 (five) minutes as needed for chest pain.   Olmesartan-amLODIPine-HCTZ 40-10-12.5 MG TABS Take 1 tablet by mouth daily.   omeprazole (PRILOSEC) 20 MG capsule Take 1 capsule (20 mg total) by mouth daily.   polyethylene glycol (MIRALAX / GLYCOLAX) 17 g packet Take 17 g by mouth daily.   traZODone (DESYREL) 50 MG tablet Take 1 tablet (50 mg total) by mouth at bedtime as needed for sleep.   [DISCONTINUED] methocarbamol (ROBAXIN) 500 MG tablet Take 1 tablet (500 mg total) by mouth every 8 (eight) hours as needed for muscle spasms. (Patient not taking: Reported on 04/04/2021)   No facility-administered encounter medications on file as of 05/13/2021.    Allergies (  verified) Niaspan [niacin er], Oxycodone-acetaminophen, and Statins   History: Past Medical History:  Diagnosis Date   Allergic rhinitis, cause unspecified    Allergy    Coronary artery disease    Dysmetabolic syndrome X    Dysuria    Esophageal reflux    Essential hypertension, benign    HNP (herniated nucleus pulposus), lumbar     Dr. Yves Dill [facility])   Hyperlipidemia    Lumbago    Lumbar radiculitis    Dr. Yves [facility]ill [facility])   Lump or mass in breast    LEFT BREAST   Myalgia and myositis, unspecified    Obstructive sleep apnea    CPAP   Osteoarthrosis, unspecified whether generalized or localized, lower leg    Other abnormal glucose    Other and unspecified angina pectoris    Personal history of malignant neoplasm of other endocrine glands and related structures    Toxic effect of venom(989.5)    Unspecified iridocyclitis    Unspecified vitamin D deficiency    Vertigo 2018   1 episode   Past Surgical History:  Procedure Laterality Date   ABDOMINAL HYSTERECTOMY  1970   ANKLE SURGERY Right 1980   ANTERIOR LATERAL LUMBAR FUSION WITH PERCUTANEOUS SCREW 2 LEVEL N/A 12/16/2020   Procedure: L2/3, L3/4 LATERAL LUMBAR FUSION, L2-4 PEDICLE SCREW FIXATION;  Surgeon: Lucy Chris, MD;  Location: [facility] OR[facility];  Service: Neurosurgery;  Laterality: N/A;   BLADDER SURGERY  2012   CARDIAC CATHETERIZATION  2003   [place]   CARDIAC CATHETERIZATION     [place]   CATARACT EXTRACTION W/PHACO Right 02/09/2018   Procedure: CATARACT EXTRACTION PHACO AND INTRAOCULAR LENS PLACEMENT (IOC) RIGHT;  Surgeon: Lockie Mola, MD;  Location[facility];  Service: Ophthalmology;  Laterality: Right;  sleep apnea   CATARACT EXTRACTION W/PHACO Left 03/09/2018   Procedure: CATARACT EXTRACTION PHACO AND INTRAOCULAR LENS PLACEMENT (IOC);  Surgeon: Lockie Mola, MD;  Location: [facility];  Service: Ophthalmology;  Laterality: Left;  IVA TOPICALLEFT   COLONOSCOPY  2008, 2015   Dr. Servando Snare   CYSTOSTOMY     KNEE SURGERY  08/18/2011   arthroscopic right   LUMBAR FUSION     L4-5, S-1   NECK SURGERY  2003   TOTAL KNEE ARTHROPLASTY Right 04/29/2015   Procedure: TOTAL KNEE ARTHROPLASTY;  Surgeon: Erin Sons, MD;  Location: [facility];  Service: Orthopedics;  Laterality: Right;   TUBAL LIGATION     Family History  Problem Relation  Age of Onset   Kidney disease Mother    Hypertension Mother    CAD Father    Healthy Sister    Kidney cancer Neg Hx    Bladder Cancer Neg Hx    Social History   Socioeconomic History   Marital status: Married    Spouse name: Morris   Number of children: 4   Years of education: Not on file   Highest education level: 11th grade  Occupational History   Occupation: Retired  Tobacco Use   Smoking status: Never   Smokeless tobacco: Never   Tobacco comments:    smoking cessation materials not required  Vaping Use   Vaping Use: Never used  Substance and Sexual Activity   Alcohol use: No    Alcohol/week: 0.0 standard drinks   Drug use: No   Sexual activity: Not Currently    Partners: Male    Birth control/protection: None  Other Topics Concern   Not on file  Social History Narrative   Not on file  Social Determinants of Health   Financial Resource Strain: Low Risk    Difficulty of Paying Living Expenses: Not very hard  Food Insecurity: No Food Insecurity   Worried About Programme researcher, broadcasting/film/video in the Last Year: Never true   Ran Out of Food in the Last Year: Never true  Transportation Needs: No Transportation Needs   Lack of Transportation (Medical): No   Lack of Transportation (Non-Medical): No  Physical Activity: Inactive   Days of Exercise per Week: 0 days   Minutes of Exercise per Session: 0 min  Stress: No Stress Concern Present   Feeling of Stress : Not at all  Social Connections: Socially Integrated   Frequency of Communication with Friends and Family: More than three times a week   Frequency of Social Gatherings with Friends and Family: More than three times a week   Attends Religious Services: More than 4 times per year   Active Member of [employer] or Organizations: Yes   Attends Engineer, structural: More than 4 times per year   Marital Status: Married    Tobacco Counseling Counseling given: No Tobacco comments: smoking cessation materials not  required   Clinical Intake:  Pre-visit preparation completed: Yes  Pain : 0-10 Pain Score: 6  Pain Type: Chronic pain Pain Location: Back Pain Orientation: Mid, Lower Pain Descriptors / Indicators: Aching, Sore Pain Onset: More than a month ago Pain Frequency: Intermittent     Nutritional Risks: None Diabetes: No  How often do you need to have someone help you when you read instructions, pamphlets, or other written materials from your doctor or pharmacy?: 1 - Never    Interpreter Needed?: No  Information entered by :: Reather Littler LPN   Activities of Daily Living In your present state of health, do you have any difficulty performing the following activities: 05/13/2021 04/04/2021  Hearing? N N  Comment declines hearing aids -  Vision? Y Y  Difficulty concentrating or making decisions? N N  Walking or climbing stairs? N N  Dressing or bathing? N N  Doing errands, shopping? N N  Preparing Food and eating ? N -  Using the Toilet? N -  In the past six months, have you accidently leaked urine? N -  Do you have problems with loss of bowel control? N -  Managing your Medications? N -  Managing your Finances? N -  Housekeeping or managing your Housekeeping? N -  Some recent data might be hidden    Patient Care Team: Alba Cory, MD as PCP - General Iran Ouch, MD as PCP - Cardiology (Cardiology) Antonieta Iba, MD as Consulting Physician (Cardiology) Vanna Scotland, MD as Consulting Physician (Urology)  Indicate any recent Medical Services you may have received from other than [facility] providers in the past year (date may be approximate).     Assessment:   This is a routine wellness examination for [place].  Hearing/Vision screen Hearing Screening - Comments:: Pt denies hearing difficulty Vision Screening - Comments:: Annual vision screenings done at [facility]; due for exam  Dietary issues and exercise activities discussed: Current Exercise  Habits: The patient does not participate in regular exercise at present, Exercise limited by: orthopedic condition(s)   Goals Addressed             This Visit's Progress    DIET - INCREASE WATER INTAKE   On track    Recommend to drink at least 6-8 8oz glasses of water per day.  Depression Screen PHQ 2/9 Scores 05/13/2021 04/04/2021 10/15/2020 05/02/2020 04/15/2020 10/16/2019 05/02/2019  PHQ - 2 Score 0 0 0 0 0 0 0  PHQ- 9 Score - - - - 0 0 0    Fall Risk Fall Risk  05/13/2021 04/04/2021 10/15/2020 05/02/2020 04/15/2020  Falls in the past year? 0 0 0 0 -  Number falls in past yr: 0 0 0 0 0  Injury with Fall? 0 0 0 0 0  Risk for fall due to : Impaired balance/gait;Orthopedic patient - - No Fall Risks -  Follow up Falls prevention discussed - - Falls prevention discussed -    FALL RISK PREVENTION PERTAINING TO THE HOME:  Any stairs in or around the home? Yes  If so, are there any without handrails? No  Home free of loose throw rugs in walkways, pet beds, electrical cords, etc? Yes  Adequate lighting in your home to reduce risk of falls? Yes   ASSISTIVE DEVICES UTILIZED TO PREVENT FALLS:  Life alert? No  Use of a cane, walker or w/c? Yes  Grab bars in the bathroom? Yes  Shower chair or bench in shower? Yes  Elevated toilet seat or a handicapped toilet? Yes   TIMED UP AND GO:  Was the test performed? No . Telephonic visit.   Cognitive Function: Normal cognitive status assessed by direct observation by this Nurse Health Advisor. No abnormalities found.       6CIT Screen 05/02/2020 05/02/2019 12/21/2017  What Year? 0 points 0 points 0 points  What month? 0 points 0 points 0 points  What time? 0 points 0 points 0 points  Count back from 20 0 points 0 points 0 points  Months in reverse 0 points 2 points 2 points  Repeat phrase 2 points 2 points 6 points  Total Score 2 4 8     Immunizations Immunization History  Administered Date(s) Administered   Influenza, High Dose  Seasonal PF 09/15/2016, 07/07/2017   Influenza-Unspecified 08/17/2014   Janssen (J&J) SARS-COV-2 Vaccination 08/07/2020   Pneumococcal Conjugate-13 12/05/2014   Pneumococcal Polysaccharide-23 08/16/2007, 02/02/2013   Tdap 02/23/2008   Zoster, Live 04/04/2008    TDAP status: Due, Education has been provided regarding the importance of this vaccine. Advised may receive this vaccine at local pharmacy or Health Dept. Aware to provide a copy of the vaccination record if obtained from local pharmacy or Health Dept. Verbalized acceptance and understanding.  Flu Vaccine status: Declined, Education has been provided regarding the importance of this vaccine but patient still declined. Advised may receive this vaccine at local pharmacy or Health Dept. Aware to provide a copy of the vaccination record if obtained from local pharmacy or Health Dept. Verbalized acceptance and understanding.  Pneumococcal vaccine status: Up to date  Covid-19 vaccine status: Completed vaccines  Qualifies for Shingles Vaccine? Yes   Zostavax completed Yes   Shingrix Completed?: No.    Education has been provided regarding the importance of this vaccine. Patient has been advised to call insurance company to determine out of pocket expense if they have not yet received this vaccine. Advised may also receive vaccine at local pharmacy or Health Dept. Verbalized acceptance and understanding.  Screening Tests Health Maintenance  Topic Date Due   Zoster Vaccines- Shingrix (1 of 2) Never done   COVID-19 Vaccine (2 - Booster for Janssen series) 10/02/2020   TETANUS/TDAP  11/02/2021 (Originally 02/22/2018)   Hepatitis C Screening  08/25/2029 (Originally 09/18/1964)   INFLUENZA VACCINE  06/02/2021   MAMMOGRAM  10/18/2021   COLONOSCOPY (Pts [phone] Insurance coverage will need to be confirmed)  12/15/2023   DEXA SCAN  Completed   PNA vac Low Risk Adult  Completed   HPV VACCINES  Aged Out    Health Maintenance  Health  Maintenance Due  Topic Date Due   Zoster Vaccines- Shingrix (1 of 2) Never done   COVID-19 Vaccine (2 - Booster for Janssen series) 10/02/2020    Colorectal cancer screening: Type of screening: Colonoscopy. Completed 12/14/13. Repeat every 10 years  Mammogram status: Completed 10/18/20. Repeat every year  Bone Density status: Completed 10/18/20. Results reflect: Bone density results: OSTEOPOROSIS. Repeat every 2 years.  Lung Cancer Screening: (Low Dose CT Chest recommended if Age 93-80 years, 30 pack-year currently smoking OR have quit w/in 15years.) does not qualify.   Additional Screening:  Hepatitis C Screening: does qualify; postponed  Vision Screening: Recommended annual ophthalmology exams for early detection of glaucoma and other disorders of the eye. Is the patient up to date with their annual eye exam?  No  Who is the provider or what is the name of the office in which the patient attends annual eye exams? [facility].   Dental Screening: Recommended annual dental exams for proper oral hygiene  Community Resource Referral / Chronic Care Management: CRR required this visit?  No   CCM required this visit?  No      Plan:     I have personally reviewed and noted the following in the patient's chart:   Medical and social history Use of alcohol, tobacco or illicit drugs  Current medications and supplements including opioid prescriptions.  Functional ability and status Nutritional status Physical activity Advanced directives List of other physicians Hospitalizations, surgeries, and ER visits in previous 12 months Vitals Screenings to include cognitive, depression, and falls Referrals and appointments  In addition, I have reviewed and discussed with patient certain preventive protocols, quality metrics, and best practice recommendations. A written personalized care plan for preventive services as well as general preventive health recommendations were provided  to patient.     Reather LittlerKasey Rodneisha Bonnet, LPN   [id number]/09607/10/2021   Nurse Notes: none

## 2021-07-09 DIAGNOSIS — E559 Vitamin D deficiency, unspecified: Secondary | ICD-10-CM | POA: Diagnosis not present

## 2021-07-09 DIAGNOSIS — M810 Age-related osteoporosis without current pathological fracture: Secondary | ICD-10-CM | POA: Diagnosis not present

## 2021-07-29 DIAGNOSIS — Z981 Arthrodesis status: Secondary | ICD-10-CM | POA: Diagnosis not present

## 2021-07-29 DIAGNOSIS — M4326 Fusion of spine, lumbar region: Secondary | ICD-10-CM | POA: Diagnosis not present

## 2021-08-05 DIAGNOSIS — M810 Age-related osteoporosis without current pathological fracture: Secondary | ICD-10-CM | POA: Diagnosis not present

## 2021-08-11 ENCOUNTER — Ambulatory Visit: Payer: BC Managed Care – PPO | Admitting: Family Medicine

## 2021-08-28 NOTE — Progress Notes (Signed)
Name: Jodi Kirby   MRN: [id number]    DOB: January 28, 1946   Date:08/29/2021       Progress Note  Subjective  Chief Complaint  Follow up   HPI  Periumbilical pain: present on her last visit, but resolved now   OSA: she usually wears her CPAP every night, she states not waking up as tired now . She takes Trazodone for insomnia, she states has nocturia twice per night  .  Insomnia: she does not take it daily, she states when she takes medication she is able to fall and stay asleep, she can sleep at least 7 hours on medication    DDD lumbar spine:  History of spinal fusion and L3-4 .and revision in Feb 2022, she states surgery seems to have worked, she states pain is finally getting better, able to get up and walk now. She states back pain right now is 0/10, occasionally. She states pain is present most days of the week, but usually mild occasionally it goes up to 8/10 , she takes Tylenol and rest and usually improves within one hour    IMPRESSION: 10/09/2020  1. Mildly progressive disc degeneration at L2-3 with moderate spinal stenosis and mild-to-moderate left greater than right neural foraminal and lateral recess stenosis. 2. Postoperative changes at L3-4 and L4-5 with unchanged residual spinal and neural foraminal stenosis at L3-4.   Knee replacement surgery right: surgery was June 27 th, 2016,  she has chronic right knee pain, she states no recent episode of effusion except for on the right side - since surgery and stable Unchanged    HTN with CKI stage III  : she is back on one pill Tribenzor  no recent episodes of chest pain or palpitation, denies orthopnea . BP has been at goal here and also at home. Denies dizziness . GFR has been in the 49-56 range over the past 5 years , denies pruritis, states good urine output    Hyperlipidemia/aorta atherosclerosis : unable to tolerate statins because it causes myopahty Dr. Mariah Milling started her on Zetia 07/2018 and is tolerating it well,  however LDL not at goal ( 121) back in 06/22  we gave her vascepa on her last visit but she did not fill it because of cost , she also has CAD discussed PCSK9 with patient today - she is worried about cost of medication but will contact insurance and let me know what she would like to do   Anemia: not iron deficiency anemia, she is 74, discussed referral to hematologist but it could be anemia of CKI, she would like to recheck level on her next visit   GERD: under control with Omeprazole in am's and symptoms have been well controlled No heartburn, no regurgitation, no change in bowel movements     Angina Pectoris: she has CAD, mild disease, seeing Dr. Mariah Milling. Taking aspirin and ARB, not on beta-blocker because of bradycardia and not on statin because of intolerance, but is now on Zetia and last LDL was above goal at 121 She states she has intermittent chest pain but not recently, she has NTG at home. Goal LDL is below 70 - she will contact insurance about PCSK-9   Osteoporosis : diagnosed in 2016 - and was started on Fosamax, previous bone density in 2018, no side effects of medication. -2.4 spine -2.2 of femur , she had repeat study at [facility] 10/2020 and showed T score spine of -2.8 and femur -2.1 . She is seeing Dr. Gershon Crane at  [facility], had SPEP, Pth and vitamin D done, first infusion of Prolia was 08/2021 and tolerated it well    Hyperglycemia: she denies polyphagia, polydipsia or polyuria. A1C was 5.5% to 5.7% A1C 5.4 % and 5.6 % on her last visit    Mild cognitive dysfunction: CIT was 8 in 2019, down to 4 in 2020 and last checked in 2021 was down to 2.  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 Denies getting lost, balanced her check book   Patient Active Problem List   Diagnosis Date Noted   Lumbar stenosis 12/16/2020   Benign hypertension with chronic kidney disease, stage III (HCC) 12/22/2018   History of lumbar fusion 07/19/2018   DDD (degenerative  disc disease), lumbosacral 07/19/2018   Obesity (BMI 30.0-34.9) 07/19/2018   DOE (dyspnea on exertion) 07/11/2018   Mild cognitive impairment 12/21/2017   Aortic atherosclerosis (HCC) 05/15/2017   Hyperglycemia 11/17/2016   Osteopenia 05/15/2016   Impingement syndrome of left shoulder 05/12/2016   Insomnia 11/08/2015   Angina pectoris (HCC) 07/10/2015   Seasonal allergic rhinitis 07/09/2015   GERD (gastroesophageal reflux disease) 07/09/2015   Bee sting allergy 07/09/2015   History of knee replacement procedure of right knee 04/29/2015   History of colonic polyps 09/24/2014   Fibrocystic breast 09/24/2014   Primary osteoarthritis of right knee 06/12/2014   Hyperlipidemia 02/09/2013   Atherosclerosis of native coronary artery of native heart with stable angina pectoris (HCC) 02/09/2013   Obstructive sleep apnea on CPAP 02/09/2013   Hypertension 02/09/2013    Past Surgical History:  Procedure Laterality Date   ABDOMINAL HYSTERECTOMY  1970   ANKLE SURGERY Right 1980   ANTERIOR LATERAL LUMBAR FUSION WITH PERCUTANEOUS SCREW 2 LEVEL N/A 12/16/2020   Procedure: L2/3, L3/4 LATERAL LUMBAR FUSION, L2-4 PEDICLE SCREW FIXATION;  Surgeon: Lucy Chris, MD;  Location: [facility] ORS;  Service: Neurosurgery;  Laterality: N/A;   BLADDER SURGERY  2012   CARDIAC CATHETERIZATION  2003   [facility]   CARDIAC CATHETERIZATION     [facility]   CATARACT EXTRACTION W/PHACO Right 02/09/2018   Procedure: CATARACT EXTRACTION PHACO AND INTRAOCULAR LENS PLACEMENT (IOC) RIGHT;  Surgeon: Lockie Mola, MD;  Location: [facility];  Service: Ophthalmology;  Laterality: Right;  sleep apnea   CATARACT EXTRACTION W/PHACO Left 03/09/2018   Procedure: CATARACT EXTRACTION PHACO AND INTRAOCULAR LENS PLACEMENT (IOC);  Surgeon: Lockie Mola, MD;  Location: [facility];  Service: Ophthalmology;  Laterality: Left;  IVA TOPICALLEFT   COLONOSCOPY  2008, 2015   Dr. Servando Snare   CYSTOSTOMY     KNEE SURGERY   08/18/2011   arthroscopic right   LUMBAR FUSION     L4-5, S-1   NECK SURGERY  2003   TOTAL KNEE ARTHROPLASTY Right 04/29/2015   Procedure: TOTAL KNEE ARTHROPLASTY;  Surgeon: Erin Sons, MD;  Location: [facility] ORS;  Service: Orthopedics;  Laterality: Right;   TUBAL LIGATION      Family History  Problem Relation Age of Onset   Kidney disease Mother    Hypertension Mother    CAD Father    Healthy Sister    Kidney cancer Neg Hx    Bladder Cancer Neg Hx     Social History   Tobacco Use   Smoking status: Never   Smokeless tobacco: Never   Tobacco comments:    smoking cessation materials not required  Substance Use Topics   Alcohol use: No    Alcohol/week: 0.0 standard drinks  Current Outpatient Medications:    acetaminophen (TYLENOL) 650 MG CR tablet, Take 650 mg by mouth daily as needed for pain., Disp: , Rfl:    alendronate (FOSAMAX) 70 MG tablet, TAKE 1 TABLET WEEKLY, Disp: 12 tablet, Rfl: 2   Ascorbic Acid (VITAMIN C WITH ROSE HIPS) 1000 MG tablet, Take 1,000 mg by mouth daily. With Zinc, Disp: , Rfl:    Cholecalciferol (VITAMIN D3) 1000 UNITS CAPS, Take 1,000 Units by mouth every morning. , Disp: , Rfl:    docusate sodium (COLACE) 100 MG capsule, Take 1 capsule (100 mg total) by mouth 2 (two) times daily., Disp: 10 capsule, Rfl: 0   EPINEPHrine (EPI-PEN) 0.3 mg/0.3 mL DEVI, Inject 0.3 mg into the muscle as needed (anaphylaxis)., Disp: , Rfl:    ezetimibe (ZETIA) 10 MG tablet, Take 1 tablet (10 mg total) by mouth daily., Disp: 90 tablet, Rfl: 3   fluticasone (FLONASE) 50 MCG/ACT nasal spray, USE TWO SPRAY(S) IN EACH NOSTRIL AT BEDTIME (Patient taking differently: Place 2 sprays into both nostrils at bedtime. USE TWO SPRAY(S) IN EACH NOSTRIL AT BEDTIME), Disp: 48 g, Rfl: 1   Multiple Vitamins-Minerals (CENTRUM SILVER PO), Take 1 tablet by mouth every morning. , Disp: , Rfl:    nitroGLYCERIN (NITROSTAT) 0.4 MG SL tablet, Place 1 tablet (0.4 mg total) under the tongue every  5 (five) minutes as needed for chest pain., Disp: 25 tablet, Rfl: 0   Olmesartan-amLODIPine-HCTZ 40-10-12.5 MG TABS, Take 1 tablet by mouth daily., Disp: 90 tablet, Rfl: 3   omeprazole (PRILOSEC OTC) 20 MG tablet, , Disp: , Rfl:    omeprazole (PRILOSEC) 20 MG capsule, Take 1 capsule (20 mg total) by mouth daily., Disp: 90 capsule, Rfl: 1   polyethylene glycol (MIRALAX / GLYCOLAX) 17 g packet, Take 17 g by mouth daily., Disp: 14 each, Rfl: 0   traZODone (DESYREL) 50 MG tablet, Take 1 tablet (50 mg total) by mouth at bedtime as needed for sleep., Disp: 90 tablet, Rfl: 1  Allergies  Allergen Reactions   Niaspan [Niacin Er] Other (See Comments)    Patient states she can't remember the reaction because it so long ago.   Oxycodone-Acetaminophen Nausea Only   Statins Other (See Comments)    Muscle and joint pain.    I personally reviewed active problem list, medication list, allergies, family history, social history, health maintenance with the patient/caregiver today.   ROS  Constitutional: Negative for fever or weight change.  Respiratory: Negative for cough and shortness of breath.   Cardiovascular: Negative for chest pain or palpitations.  Gastrointestinal: Negative for abdominal pain, no bowel changes.  Musculoskeletal: Negative for gait problem or joint swelling.  Skin: Negative for rash.  Neurological: Negative for dizziness or headache.  No other specific complaints in a complete review of systems (except as listed in HPI above).   Objective  Vitals:   08/29/21 0853  BP: 124/64  Pulse: 70  Resp: 16  Temp: 97.6 F (36.4 C)  SpO2: 99%  Weight: 182 lb 4.8 oz (82.7 kg)  Height: 5\' 3"  (1.6 m)    Body mass index is 32.29 kg/m.  Physical Exam  Constitutional: Patient appears well-developed and well-nourished. Obese  No distress.  HEENT: head atraumatic, normocephalic, pupils equal and reactive to light, neck supple Cardiovascular: Normal rate, regular rhythm and normal  heart sounds.  No murmur heard. No BLE edema. Pulmonary/Chest: Effort normal and breath sounds normal. No respiratory distress. Abdominal: Soft.  There is no tenderness. Psychiatric: Patient has  a normal mood and affect. behavior is normal. Judgment and thought content normal.   PHQ2/9: Depression screen [facility] 2/9 08/29/2021 05/13/2021 04/04/2021 10/15/2020 05/02/2020  Decreased Interest 0 0 0 0 0  Down, Depressed, Hopeless 0 0 0 0 0  PHQ - 2 Score 0 0 0 0 0  Altered sleeping 0 - - - -  Tired, decreased energy 0 - - - -  Change in appetite 0 - - - -  Feeling bad or failure about yourself  0 - - - -  Trouble concentrating 0 - - - -  Moving slowly or fidgety/restless 0 - - - -  Suicidal thoughts 0 - - - -  PHQ-9 Score 0 - - - -  Difficult doing work/chores Not difficult at all - - - -  Some recent data might be hidden    phq 9 is negative   Fall Risk: Fall Risk  08/29/2021 05/13/2021 04/04/2021 10/15/2020 05/02/2020  Falls in the past year? 0 0 0 0 0  Number falls in past yr: 0 0 0 0 0  Injury with Fall? 0 0 0 0 0  Risk for fall due to : - Impaired balance/gait;Orthopedic patient - - No Fall Risks  Follow up - Falls prevention discussed - - Falls prevention discussed      Functional Status Survey: Is the patient deaf or have difficulty hearing?: No Does the patient have difficulty seeing, even when wearing glasses/contacts?: No Does the patient have difficulty concentrating, remembering, or making decisions?: No Does the patient have difficulty walking or climbing stairs?: No Does the patient have difficulty dressing or bathing?: No Does the patient have difficulty doing errands alone such as visiting a doctor's office or shopping?: No   Assessment & Plan  1. Atherosclerosis of native coronary artery of native heart with stable angina pectoris (HCC)  Needs to get LDL below 70 and will check coverage of PCSK-19 with insurance company   2. Angina pectoris (HCC)  No recent use of  NTG  3. Aortic atherosclerosis (HCC)  On zetia only   4. Need for immunization against influenza  Refused   5. Benign hypertension with chronic kidney disease, stage III (HCC)   6. Obstructive sleep apnea on CPAP   7. Stage 3a chronic kidney disease (HCC)   8. Anemia of chronic disease   9. Vitamin D deficiency   10. Localized osteoporosis without current pathological fracture   11. DDD (degenerative disc disease), lumbosacral   12. Mild cognitive impairment   13. Dyslipidemia   14. Gastroesophageal reflux disease without esophagitis   15. Perennial allergic rhinitis with seasonal variation  - fluticasone (FLONASE) 50 MCG/ACT nasal spray; Place 2 sprays into both nostrils at bedtime. USE TWO SPRAY(S) IN EACH NOSTRIL AT BEDTIME  Dispense: 48 mL; Refill: 0

## 2021-08-29 ENCOUNTER — Other Ambulatory Visit: Payer: Self-pay

## 2021-08-29 ENCOUNTER — Encounter: Payer: Self-pay | Admitting: Family Medicine

## 2021-08-29 ENCOUNTER — Ambulatory Visit (INDEPENDENT_AMBULATORY_CARE_PROVIDER_SITE_OTHER): Payer: Medicare HMO | Admitting: Family Medicine

## 2021-08-29 VITALS — BP 124/64 | HR 70 | Temp 97.6°F | Resp 16 | Ht 63.0 in | Wt 182.3 lb

## 2021-08-29 DIAGNOSIS — M816 Localized osteoporosis [Lequesne]: Secondary | ICD-10-CM | POA: Diagnosis not present

## 2021-08-29 DIAGNOSIS — N1831 Chronic kidney disease, stage 3a: Secondary | ICD-10-CM

## 2021-08-29 DIAGNOSIS — J302 Other seasonal allergic rhinitis: Secondary | ICD-10-CM

## 2021-08-29 DIAGNOSIS — K219 Gastro-esophageal reflux disease without esophagitis: Secondary | ICD-10-CM

## 2021-08-29 DIAGNOSIS — I700 Atherosclerosis of aorta: Secondary | ICD-10-CM

## 2021-08-29 DIAGNOSIS — E559 Vitamin D deficiency, unspecified: Secondary | ICD-10-CM | POA: Diagnosis not present

## 2021-08-29 DIAGNOSIS — I25118 Atherosclerotic heart disease of native coronary artery with other forms of angina pectoris: Secondary | ICD-10-CM | POA: Diagnosis not present

## 2021-08-29 DIAGNOSIS — G4733 Obstructive sleep apnea (adult) (pediatric): Secondary | ICD-10-CM

## 2021-08-29 DIAGNOSIS — D638 Anemia in other chronic diseases classified elsewhere: Secondary | ICD-10-CM

## 2021-08-29 DIAGNOSIS — M5137 Other intervertebral disc degeneration, lumbosacral region: Secondary | ICD-10-CM

## 2021-08-29 DIAGNOSIS — E785 Hyperlipidemia, unspecified: Secondary | ICD-10-CM

## 2021-08-29 DIAGNOSIS — N183 Chronic kidney disease, stage 3 unspecified: Secondary | ICD-10-CM

## 2021-08-29 DIAGNOSIS — I209 Angina pectoris, unspecified: Secondary | ICD-10-CM

## 2021-08-29 DIAGNOSIS — G3184 Mild cognitive impairment, so stated: Secondary | ICD-10-CM

## 2021-08-29 DIAGNOSIS — J3089 Other allergic rhinitis: Secondary | ICD-10-CM

## 2021-08-29 DIAGNOSIS — Z23 Encounter for immunization: Secondary | ICD-10-CM

## 2021-08-29 DIAGNOSIS — I129 Hypertensive chronic kidney disease with stage 1 through stage 4 chronic kidney disease, or unspecified chronic kidney disease: Secondary | ICD-10-CM

## 2021-08-29 DIAGNOSIS — Z9989 Dependence on other enabling machines and devices: Secondary | ICD-10-CM

## 2021-08-29 MED ORDER — FLUTICASONE PROPIONATE 50 MCG/ACT NA SUSP: 2.0000 | Freq: Every day | NASAL | 0 refills | Status: DC

## 2021-08-29 NOTE — Patient Instructions (Signed)
Check with insurance price of injectable cholesterol medications:  Praluent or Repatha The class of drugs is called PCSK9

## 2021-09-29 ENCOUNTER — Other Ambulatory Visit: Payer: Self-pay | Admitting: Cardiovascular Disease

## 2021-09-29 DIAGNOSIS — Z789 Other specified health status: Secondary | ICD-10-CM

## 2021-09-29 DIAGNOSIS — E782 Mixed hyperlipidemia: Secondary | ICD-10-CM

## 2021-10-13 ENCOUNTER — Telehealth: Payer: Self-pay

## 2021-10-13 NOTE — Telephone Encounter (Signed)
Placed in outgoing mail/1619 10/13/21

## 2021-10-13 NOTE — Telephone Encounter (Signed)
Copied from CRM [id number]. Topic: General - Inquiry >> Oct 13, 2021  2:35 PM Crist Infante wrote: Reason for CRM: is calling back to advise she would like you to MAIL the papers the dr has for her.  Address confirmed.

## 2021-10-19 NOTE — Progress Notes (Signed)
Evaluation Performed:  Follow-up visit  Date:  10/20/2021   ID:  Jodi Kirby, Jodi Kirby Aug 15, 1946, MRN [id number]  Patient Location:  [address] [facility] [address]   Provider location:   [facility], [place] office  PCP:  Alba Cory, MD  Cardiologist:  [facility]  Chief Complaint  Patient presents with   12 month follow up     Patient c/o shortness of breath on exertion and chest discomfort at times. Medications reviewed by the patient verbally.      History of Present Illness:    Jodi Kirby is a 75 y.o. female past medical history of coronary  arterial disease, catheterization in 2003 with 20% disease in her RCA and LAD,  hyperlipidemia, back surgery,  obstructive sleep apnea on CPAP,  Total knee replacement  chest pressure.  She presents for routine followup of her CAD, hyperlipidemia  LOV 11/21 In follow-up today she reports doing well Underwent orthopedic surgery due to spinal stenosis and back pain   surgery, 12/2020 Still sore on the sides Tries to stay active, does some light stretching  Weight stable Previously walking 1 to 2 miles a day Stays active Less active in the cold weather  Cardiac risk factors Non smoker A1C 5.6 Total chol 201, LDL 121 Stopped Zetia, unclear if she had a GI issue Does not really want to go on injection medication  No chest pain or shortness of breath on exertion No symptoms concerning for angina she feels  Prior stressors with husband several years ago, he was sick, he has since recovered Weight was 85 pounds now up to 190  Continues on triple combination blood pressure pill Reports statin intolerance   intolerant of  low-dose Crestor  tried several other statins. Each of these caused cramping  EKG personally reviewed by myself on todays visit Normal sinus rhythm rate 60 bpm no significant ST-T wave changes  Other past medical history reviewed admitted to the  hospital August 2017 for chest pain Cardiac enzymes negative,  She had outpatient Exercise stress Myoview test 07/29/2016 showing no ischemia, Ejection fraction 62%   Stress test in 2014 And August 2017 showing no ischemia    Past Medical History:  Diagnosis Date   Allergic rhinitis, cause unspecified    Allergy    Coronary artery disease    Dysmetabolic syndrome X    Dysuria    Esophageal reflux    Essential hypertension, benign    HNP (herniated nucleus pulposus), lumbar    Dr. Yves Dill [facility])   Hyperlipidemia    Lumbago    Lumbar radiculitis    Dr. Yves Dill [facility])   Lump or mass in breast    LEFT BREAST   Myalgia and myositis, unspecified    Obstructive sleep apnea    CPAP   Osteoarthrosis, unspecified whether generalized or localized, lower leg    Other abnormal glucose    Other and unspecified angina pectoris    Personal history of malignant neoplasm of other endocrine glands and related structures    Toxic effect of venom(989.5)    Unspecified iridocyclitis    Unspecified vitamin D deficiency    Vertigo 2018   1 episode   Past Surgical History:  Procedure Laterality Date   ABDOMINAL HYSTERECTOMY  1970   ANKLE SURGERY Right 1980   ANTERIOR LATERAL LUMBAR FUSION WITH PERCUTANEOUS SCREW 2 LEVEL N/A 12/16/2020   Procedure: L2/3, L3/4 LATERAL LUMBAR FUSION, L2-4 PEDICLE SCREW  FIXATION;  Surgeon: Lucy Chris, MD;  Location: [facility] ORS;  Service: Neurosurgery;  Laterality: N/A;   BLADDER SURGERY  2012   CARDIAC CATHETERIZATION  2003   [facility]   CARDIAC CATHETERIZATION     [facility]   CATARACT EXTRACTION W/PHACO Right 02/09/2018   Procedure: CATARACT EXTRACTION PHACO AND INTRAOCULAR LENS PLACEMENT (IOC) RIGHT;  Surgeon: Lockie Mola, MD;  Location: [facility];  Service: Ophthalmology;  Laterality: Right;  sleep apnea   CATARACT EXTRACTION W/PHACO Left 03/09/2018   Procedure: CATARACT EXTRACTION PHACO AND INTRAOCULAR LENS PLACEMENT (IOC);  Surgeon: Lockie Mola, MD;  Location: [facility];  Service: Ophthalmology;  Laterality: Left;  IVA TOPICALLEFT   COLONOSCOPY  2008, 2015   Dr. Servando Snare   CYSTOSTOMY     KNEE SURGERY  08/18/2011   arthroscopic right   LUMBAR FUSION     L4-5, S-1   NECK SURGERY  2003   TOTAL KNEE ARTHROPLASTY Right 04/29/2015   Procedure: TOTAL KNEE ARTHROPLASTY;  Surgeon: Erin Sons, MD;  Location: [facility] ORS;  Service: Orthopedics;  Laterality: Right;   TUBAL LIGATION       Allergies:   Niaspan [niacin er], Oxycodone-acetaminophen, and Statins   Social History   Tobacco Use   Smoking status: Never   Smokeless tobacco: Never   Tobacco comments:    smoking cessation materials not required  Vaping Use   Vaping Use: Never used  Substance Use Topics   Alcohol use: No    Alcohol/week: 0.0 standard drinks   Drug use: No     Current Outpatient Medications on File Prior to Visit  Medication Sig Dispense Refill   acetaminophen (TYLENOL) 650 MG CR tablet Take 650 mg by mouth daily as needed for pain.     Ascorbic Acid (VITAMIN C WITH ROSE HIPS) 1000 MG tablet Take 1,000 mg by mouth daily. With Zinc     Cholecalciferol (VITAMIN D3) 1000 UNITS CAPS Take 1,000 Units by mouth every morning.      docusate sodium (COLACE) 100 MG capsule Take 1 capsule (100 mg total) by mouth 2 (two) times daily. 10 capsule 0   EPINEPHrine (EPI-PEN) 0.3 mg/0.3 mL DEVI Inject 0.3 mg into the muscle as needed (anaphylaxis).     fluticasone (FLONASE) 50 MCG/ACT nasal spray Place 2 sprays into both nostrils at bedtime. USE TWO SPRAY(S) IN EACH NOSTRIL AT BEDTIME 48 mL 0   Multiple Vitamins-Minerals (CENTRUM SILVER PO) Take 1 tablet by mouth every morning.      omeprazole (PRILOSEC) 20 MG capsule Take 1 capsule (20 mg total) by mouth daily. 90 capsule 1   polyethylene glycol (MIRALAX / GLYCOLAX) 17 g packet Take 17 g by mouth daily. 14 each 0   traZODone (DESYREL) 50 MG tablet Take 1 tablet (50 mg total) by mouth at bedtime as needed  for sleep. 90 tablet 1   No current facility-administered medications on file prior to visit.     Family Hx: The patient's family history includes CAD in her father; Healthy in her sister; Hypertension in her mother; Kidney disease in her mother. There is no history of Kidney cancer or Bladder Cancer.  ROS:   Please see the history of present illness.    Review of Systems  Constitutional: Negative.   HENT: Negative.    Respiratory: Negative.    Cardiovascular: Negative.   Gastrointestinal: Negative.   Musculoskeletal: Negative.   Neurological: Negative.   Psychiatric/Behavioral: Negative.    All other systems reviewed and are negative.  Labs/Other Tests and Data Reviewed:    Recent Labs: 04/04/2021: ALT 18; BUN 14; Creat 1.11; Hemoglobin 10.2; Platelets 278; Potassium 3.7; Sodium 144   Recent Lipid Panel Lab Results  Component Value Date/Time   CHOL 201 (H) 04/04/2021 09:59 AM   CHOL 211 (H) 11/08/2015 11:01 AM   TRIG 159 (H) 04/04/2021 09:59 AM   HDL 53 04/04/2021 09:59 AM   HDL 48 11/08/2015 11:01 AM   CHOLHDL 3.8 04/04/2021 09:59 AM   LDLCALC 121 (H) 04/04/2021 09:59 AM    Wt Readings from Last 3 Encounters:  10/20/21 180 lb 4 oz (81.8 kg)  08/29/21 182 lb 4.8 oz (82.7 kg)  04/04/21 179 lb (81.2 kg)     Exam:    Vital Signs: Vital signs may also be detailed in the HPI BP 130/60 (BP Location: Left Arm, Patient Position: Sitting, Cuff Size: Normal)    Pulse 60    Ht 5\' 3"  (1.6 m)    Wt 180 lb 4 oz (81.8 kg)    SpO2 98%    BMI 31.93 kg/m   Constitutional:  oriented to person, place, and time. No distress.  HENT:  Head: Grossly normal Eyes:  no discharge. No scleral icterus.  Neck: No JVD, no carotid bruits  Cardiovascular: Regular rate and rhythm, no murmurs appreciated Pulmonary/Chest: Clear to auscultation bilaterally, no wheezes or rails Abdominal: Soft.  no distension.  no tenderness.  Musculoskeletal: Normal range of motion Neurological:  normal muscle  tone. Coordination normal. No atrophy Skin: Skin warm and dry Psychiatric: normal affect, pleasant  ASSESSMENT & PLAN:    Problem List Items Addressed This Visit       Cardiology Problems   Hyperlipidemia   Relevant Medications   Olmesartan-amLODIPine-HCTZ 40-10-12.5 MG TABS   nitroGLYCERIN (NITROSTAT) 0.4 MG SL tablet   ezetimibe (ZETIA) 10 MG tablet   Other Visit Diagnoses     Coronary artery disease of native artery of native heart with stable angina pectoris (HCC)    -  Primary   Relevant Medications   Olmesartan-amLODIPine-HCTZ 40-10-12.5 MG TABS   nitroGLYCERIN (NITROSTAT) 0.4 MG SL tablet   ezetimibe (ZETIA) 10 MG tablet   Other Relevant Orders   EKG 12-Lead   Essential hypertension       Relevant Medications   Olmesartan-amLODIPine-HCTZ 40-10-12.5 MG TABS   nitroGLYCERIN (NITROSTAT) 0.4 MG SL tablet   ezetimibe (ZETIA) 10 MG tablet   Other Relevant Orders   EKG 12-Lead   Statin intolerance       Relevant Medications   ezetimibe (ZETIA) 10 MG tablet     Essential hypertension Blood pressure is well controlled on today's visit. No changes made to the medications.  Adjustment disorder Previous issues with sick husband, he has since recovered Less stress at home  Aortic atherosclerosis Prefers not to be on injection medication, PCSK9 inhibitor Willing to retry Zetia 10 mg daily If tolerated, and if needed potentially could try low-dose Crestor 5 2 days a week with Zetia  Hyperlipidemia Plan as above   Total encounter time more than 25 minutes  Greater than 50% was spent in counseling and coordination of care with the patient    Signed, 08-20-1975, MD  [facility] [address], [place], [place]

## 2021-10-20 ENCOUNTER — Encounter: Payer: Self-pay | Admitting: Cardiovascular Disease

## 2021-10-20 ENCOUNTER — Ambulatory Visit (INDEPENDENT_AMBULATORY_CARE_PROVIDER_SITE_OTHER): Payer: Medicare HMO | Admitting: Cardiovascular Disease

## 2021-10-20 ENCOUNTER — Other Ambulatory Visit: Payer: Self-pay

## 2021-10-20 VITALS — BP 130/60 | HR 60 | Ht 63.0 in | Wt 180.2 lb

## 2021-10-20 DIAGNOSIS — I25118 Atherosclerotic heart disease of native coronary artery with other forms of angina pectoris: Secondary | ICD-10-CM | POA: Diagnosis not present

## 2021-10-20 DIAGNOSIS — Z1231 Encounter for screening mammogram for malignant neoplasm of breast: Secondary | ICD-10-CM | POA: Diagnosis not present

## 2021-10-20 DIAGNOSIS — I10 Essential (primary) hypertension: Secondary | ICD-10-CM

## 2021-10-20 DIAGNOSIS — Z789 Other specified health status: Secondary | ICD-10-CM

## 2021-10-20 DIAGNOSIS — E782 Mixed hyperlipidemia: Secondary | ICD-10-CM | POA: Diagnosis not present

## 2021-10-20 LAB — HM MAMMOGRAPHY

## 2021-10-20 MED ORDER — NITROGLYCERIN 0.4 MG SL SUBL: 0.4000 mg | SUBLINGUAL_TABLET | SUBLINGUAL | 0 refills | Status: AC | PRN

## 2021-10-20 MED ORDER — EZETIMIBE 10 MG PO TABS: 10.0000 mg | ORAL_TABLET | Freq: Every day | ORAL | 3 refills | Status: DC

## 2021-10-20 MED ORDER — OLMESARTAN-AMLODIPINE-HCTZ 40-10-12.5 MG PO TABS: 1.0000 | ORAL_TABLET | Freq: Every day | ORAL | 3 refills | Status: DC

## 2021-10-20 NOTE — Patient Instructions (Addendum)
Medication Instructions:  Please Retry zetia 10 mg daily  If you need a refill on your cardiac medications before your next appointment, please call your pharmacy.   Lab work: No new labs needed  Testing/Procedures: No new testing needed  Follow-Up: At [facility], you and your health needs are our priority.  As part of our continuing mission to provide you with exceptional heart care, we have created designated Provider Care Teams.  These Care Teams include your primary Cardiologist (physician) and Advanced Practice Providers (APPs -  Physician Assistants and Nurse Practitioners) who all work together to provide you with the care you need, when you need it.  You will need a follow up appointment in 12 months  Providers on your designated Care Team:   Nicolasa Ducking, NP Eula Listen, PA-C Cadence Fransico [place], [place]  COVID-19 Vaccine Information can be found at: [email or web address] For questions related to vaccine distribution or appointments, please email [email or web address] or call [phone].

## 2021-10-22 ENCOUNTER — Other Ambulatory Visit: Payer: Self-pay | Admitting: Family Medicine

## 2021-10-22 DIAGNOSIS — K219 Gastro-esophageal reflux disease without esophagitis: Secondary | ICD-10-CM

## 2021-10-29 DIAGNOSIS — M5137 Other intervertebral disc degeneration, lumbosacral region: Secondary | ICD-10-CM | POA: Diagnosis not present

## 2021-10-29 DIAGNOSIS — R109 Unspecified abdominal pain: Secondary | ICD-10-CM | POA: Diagnosis not present

## 2021-10-29 DIAGNOSIS — R1031 Right lower quadrant pain: Secondary | ICD-10-CM | POA: Diagnosis not present

## 2021-10-29 DIAGNOSIS — Z981 Arthrodesis status: Secondary | ICD-10-CM | POA: Diagnosis not present

## 2021-11-02 ENCOUNTER — Other Ambulatory Visit: Payer: Self-pay

## 2021-11-02 DIAGNOSIS — R109 Unspecified abdominal pain: Secondary | ICD-10-CM | POA: Diagnosis not present

## 2021-11-02 DIAGNOSIS — R112 Nausea with vomiting, unspecified: Secondary | ICD-10-CM | POA: Diagnosis not present

## 2021-11-02 DIAGNOSIS — I10 Essential (primary) hypertension: Secondary | ICD-10-CM | POA: Insufficient documentation

## 2021-11-02 DIAGNOSIS — R1011 Right upper quadrant pain: Secondary | ICD-10-CM | POA: Insufficient documentation

## 2021-11-02 DIAGNOSIS — R1110 Vomiting, unspecified: Secondary | ICD-10-CM | POA: Diagnosis not present

## 2021-11-02 DIAGNOSIS — K219 Gastro-esophageal reflux disease without esophagitis: Secondary | ICD-10-CM | POA: Diagnosis not present

## 2021-11-02 LAB — COMPREHENSIVE METABOLIC PANEL
ALT: 16 U/L (ref 0–44)
AST: 15 U/L (ref 15–41)
Albumin: 4.3 g/dL (ref 3.5–5.0)
Alkaline Phosphatase: 74 U/L (ref 38–126)
Anion gap: 7 (ref 5–15)
BUN: 23 mg/dL (ref 8–23)
CO2: 28 mmol/L (ref 22–32)
Calcium: 9.9 mg/dL (ref 8.9–10.3)
Chloride: 103 mmol/L (ref 98–111)
Creatinine, Ser: 1.22 mg/dL — ABNORMAL HIGH (ref 0.44–1.00)
GFR, Estimated: 46 mL/min — ABNORMAL LOW (ref >60)
Glucose, Bld: 123 mg/dL — ABNORMAL HIGH (ref 70–99)
Potassium: 3.8 mmol/L (ref 3.5–5.1)
Sodium: 138 mmol/L (ref 135–145)
Total Bilirubin: 0.4 mg/dL (ref 0.3–1.2)
Total Protein: 7.3 g/dL (ref 6.5–8.1)

## 2021-11-02 LAB — LIPASE, BLOOD: Lipase: 40 U/L (ref 11–51)

## 2021-11-02 LAB — URINALYSIS, ROUTINE W REFLEX MICROSCOPIC
Bacteria, UA: NONE SEEN
Bilirubin Urine: NEGATIVE
Glucose, UA: NEGATIVE mg/dL
Hgb urine dipstick: NEGATIVE
Ketones, ur: NEGATIVE mg/dL
Nitrite: NEGATIVE
Protein, ur: NEGATIVE mg/dL
Specific Gravity, Urine: 1.01 (ref 1.005–1.030)
pH: 6 (ref 5.0–8.0)

## 2021-11-02 LAB — CBC
HCT: 33.6 % — ABNORMAL LOW (ref 36.0–46.0)
Hemoglobin: 11.4 g/dL — ABNORMAL LOW (ref 12.0–15.0)
MCH: 28 pg (ref 26.0–34.0)
MCHC: 33.9 g/dL (ref 30.0–36.0)
MCV: 82.6 fL (ref 80.0–100.0)
Platelets: 300 10*3/uL (ref 150–400)
RBC: 4.07 MIL/uL (ref 3.87–5.11)
RDW: 12.5 % (ref 11.5–15.5)
WBC: 10.2 10*3/uL (ref 4.0–10.5)
nRBC: 0 % (ref 0.0–0.2)

## 2021-11-02 MED ORDER — ONDANSETRON 4 MG PO TBDP
4.0000 mg | ORAL_TABLET | Freq: Once | ORAL | Status: AC | PRN
  Administered 2021-11-02: 4 mg via ORAL
  Filled 2021-11-02: qty 1

## 2021-11-02 NOTE — ED Triage Notes (Signed)
Pt presents to ER c/o ruq abd pain since 12/28.  Pt states she has had some associated nausea and vomiting with the pain.  Pt states pain radiated from right side around to back.  Pt denies hx of liver or gall bladder issues.  Pt A&O x4 at this time in NAD.

## 2021-11-03 ENCOUNTER — Emergency Department: Payer: Medicare PPO

## 2021-11-03 ENCOUNTER — Emergency Department
Admission: EM | Admit: 2021-11-03 | Discharge: 2021-11-03 | Disposition: A | Discharge status: Home / Self Care | Payer: Medicare PPO | Attending: Emergency Medicine | Admitting: Emergency Medicine

## 2021-11-03 DIAGNOSIS — R109 Unspecified abdominal pain: Secondary | ICD-10-CM

## 2021-11-03 DIAGNOSIS — R1110 Vomiting, unspecified: Secondary | ICD-10-CM | POA: Diagnosis not present

## 2021-11-03 DIAGNOSIS — R1011 Right upper quadrant pain: Secondary | ICD-10-CM | POA: Diagnosis not present

## 2021-11-03 MED ORDER — IOHEXOL 300 MG/ML  SOLN
100.0000 mL | Freq: Once | INTRAMUSCULAR | Status: AC | PRN
  Administered 2021-11-03: 80 mL via INTRAVENOUS

## 2021-11-03 MED ORDER — SUCRALFATE 1 G PO TABS: 1.0000 g | ORAL_TABLET | Freq: Four times a day (QID) | ORAL | 0 refills | Status: DC

## 2021-11-03 NOTE — ED Provider Notes (Signed)
[facility] Provider Note    Event Date/Time   First MD Initiated Contact with Patient 11/03/21 [phone]     (approximate)   History   Abdominal Pain and Nausea  HPI  Jodi Kirby is a 76 y.o. female with a past medical history of hyperlipidemia, hypertension, gastric reflux, mild cognitive impairment, presents to the emergency department for right upper quadrant abdominal pain.  According to the patient over the past few weeks she has been experiencing intermittent pain in the right upper quadrant of her abdomen.  Patient states at times she will become nauseated and vomit.  Denies any diarrhea.  Denies any fever.  Patient is not sure if it is worse when she eats but she believes it could be.  Patient does have her gallbladder.     Physical Exam   Triage Vital Signs: ED Triage Vitals  Enc Vitals Group     BP 11/02/21 2235 (!) 167/83     Pulse Rate 11/02/21 2235 74     Resp 11/02/21 2235 16     Temp 11/02/21 2235 98.8 F (37.1 C)     Temp Source 11/02/21 2235 Oral     SpO2 11/02/21 2235 98 %     Weight 11/02/21 2236 175 lb (79.4 kg)     Height 11/02/21 2236 5\' 3"  (1.6 m)     Head Circumference --      Peak Flow --      Pain Score 11/02/21 2235 10     Pain Loc --      Pain Edu? --      Excl. in [place]? --     Most recent vital signs: Vitals:   11/03/21 0212 11/03/21 0617  BP: (!) 152/64 (!) 152/72  Pulse: (!) 56 60  Resp: 18 18  Temp: 98.6 F (37 C)   SpO2: 99% 100%     General: Awake, no distress.  Calm cooperative and pleasant. CV:  Good peripheral perfusion.  Regular rate and rhythm around 80 bpm. Resp:  Normal effort.  Equal breath sounds bilaterally without wheeze rales or rhonchi Abd:  No distention.  Soft, mild to moderate right upper quadrant/right flank tenderness to palpation without rebound guarding or distention.    ED Results / Procedures / Treatments   Labs (all labs ordered are listed, but only abnormal results are  displayed) Labs Reviewed  COMPREHENSIVE METABOLIC PANEL - Abnormal; Notable for the following components:      Result Value   Glucose, Bld 123 (*)    Creatinine, Ser 1.22 (*)    GFR, Estimated 46 (*)    All other components within normal limits  CBC - Abnormal; Notable for the following components:   Hemoglobin 11.4 (*)    HCT 33.6 (*)    All other components within normal limits  URINALYSIS, ROUTINE W REFLEX MICROSCOPIC - Abnormal; Notable for the following components:   Color, Urine STRAW (*)    APPearance CLEAR (*)    Leukocytes,Ua TRACE (*)    All other components within normal limits  LIPASE, BLOOD      RADIOLOGY CT scan is negative for acute abnormality. Ultrasound is negative for cholelithiasis or cholecystitis  MEDICATIONS ORDERED IN ED: Medications  ondansetron (ZOFRAN-ODT) disintegrating tablet 4 mg (4 mg Oral Given 11/02/21 2239)  iohexol (OMNIPAQUE) 300 MG/ML solution 100 mL (80 mLs Intravenous Contrast Given 11/03/21 0349)     IMPRESSION / MDM / ASSESSMENT AND PLAN / ED COURSE  I reviewed  the triage vital signs and the nursing notes.                              Differential diagnosis includes, but is not limited to, cholecystitis, biliary colic, gastroenteritis, gastritis, peptic ulcer disease, diverticulitis, colitis, pancreatitis.   Patient presents to the emergency department for several weeks of intermittent right upper quadrant abdominal pain.  She is not sure if it is associated with food.  Patient does have mild to moderate tenderness on exam mostly in the right upper quadrant but as well as the right flank.  No rebound guarding or distention.  Patient denies any fever.  Does state intermittent nausea and vomiting.  Lab work is largely nonrevealing reassuringly normal white blood cell count, normal LFTs and lipase.  The remainder the lab work is reassuring as well.  CT scan shows no acute abnormality given the patient's more focal pain in the right upper  quadrant we will obtain an ultrasound to further evaluate for possible biliary colic/cholelithiasis/cholecystitis.  Ultrasound is negative for acute abnormality including no cholelithiasis.  Given the patient's reassuring work-up I believe the patient is safe for discharge home.  Highly suspect gastritis to be the cause of the patient's discomfort.  We will discharge the patient on Carafate, dietary precautions and have her follow-up with GI medicine.       FINAL CLINICAL IMPRESSION(S) / ED DIAGNOSES   Abdominal pain     Note:  This document was prepared using Dragon voice recognition software and may include unintentional dictation errors.    Harvest Dark, MD 11/03/21 [phone]

## 2021-11-03 NOTE — ED Notes (Signed)
See triage note  presents with pain to right upper abd area states pain has been going on for some time  was seen by her PCP last week  states pain is not any better   describes pain as sharp at times

## 2021-11-19 DIAGNOSIS — R1013 Epigastric pain: Secondary | ICD-10-CM | POA: Insufficient documentation

## 2021-11-19 DIAGNOSIS — D649 Anemia, unspecified: Secondary | ICD-10-CM | POA: Insufficient documentation

## 2021-11-19 DIAGNOSIS — K219 Gastro-esophageal reflux disease without esophagitis: Secondary | ICD-10-CM | POA: Diagnosis not present

## 2021-11-19 DIAGNOSIS — D638 Anemia in other chronic diseases classified elsewhere: Secondary | ICD-10-CM | POA: Insufficient documentation

## 2021-11-19 DIAGNOSIS — Z1211 Encounter for screening for malignant neoplasm of colon: Secondary | ICD-10-CM | POA: Diagnosis not present

## 2021-12-04 ENCOUNTER — Other Ambulatory Visit: Payer: Self-pay | Admitting: Family

## 2021-12-04 DIAGNOSIS — I10 Essential (primary) hypertension: Secondary | ICD-10-CM

## 2021-12-04 NOTE — Telephone Encounter (Signed)
Rx(s) sent to pharmacy electronically.  

## 2021-12-12 ENCOUNTER — Other Ambulatory Visit: Payer: Self-pay | Admitting: Neurosurgery

## 2021-12-25 NOTE — Progress Notes (Signed)
Name: Jodi Kirby   MRN: [id number]    DOB: [birth year]   Date:12/29/2021       Progress Note  Subjective  Chief Complaint  Follow Up  HPI  Epigastric pain: she went to Urgent Care 10/2021  for hip pain, she was given Tramadol and Skelaxin she developed severe vomiting and had to go to  [facility] for further evaluation. She had a negative gallbladder US and was sent to GI for further evaluation. She will have EGD done in April . She no longer having vomiting, she has some burning epigastric sensation after meals.   OSA: she usually wears her CPAP every night.  She takes Trazodone for insomnia but no longer working, taking 75 mg qpm and only sleeps about 4 hours per night, discussed low dose of Seroquel, she is wiling to try it.    Insomnia: she does not take it daily, she states when she takes medication she is able to fall and stay asleep, she used to get 7 hours of sleep, but lately only sleeping for about 4 hours ( it started a couple of months ago), we will switch to seroquel    DDD lumbar spine:  History of spinal fusion and L3-4 .and revision in Feb 2022, she states surgery seems to have worked, she states pain is finally getting better, able to get up and walk now. She states back pain right now is 0/10, occasionally. She states pain is present most days of the week, but usually mild occasionally it goes up to 8/10 , she takes Tylenol and rest and usually improves within one hour    IMPRESSION: 10/09/2020  1. Mildly progressive disc degeneration at L2-3 with moderate spinal stenosis and mild-to-moderate left greater than right neural foraminal and lateral recess stenosis. 2. Postoperative changes at L3-4 and L4-5 with unchanged residual spinal and neural foraminal stenosis at L3-4.   Knee replacement surgery right: surgery was June 27 th, 2016,  she has chronic right knee pain, she states seems slightly bigger/swollen than left side, but no instability and able to walk without pain     HTN with CKI stage III : she is back on one pill Tribenzor  no recent episodes of chest pain or palpitation, denies orthopnea or dizziness  . BP has been at goal here and also at home. Denies dizziness  GFR has been in the 49-56 range over the past 5 years , denies pruritis, states good urine output    Hyperlipidemia/aorta atherosclerosis : unable to tolerate statins because it causes myopahty Dr. Rockey Situ started her on Zetia 07/2018 and is tolerating it well, however LDL not at goal ( 121) back in 06/22  we gave her vascepa on her last visit but she did not fill it because of cost , she also has CAD discussed PCSK9 but also cost prohibitive.   Anemia: not iron deficiency anemia, low B12 , just started supplementation. She likely has anemia of chronic disease   Angina Pectoris: she has CAD, mild disease, seeing Dr. Rockey Situ. Taking aspirin and ARB, not on beta-blocker because of bradycardia and not on statin because of intolerance, but is now on Zetia and last LDL was above goal at 121 No recent episodes of chest pain  Goal LDL is below 70 . I will send rx to pharmacy to see if it is covered    Osteoporosis : diagnosed in 2016 - and was started on Fosamax, previous bone density in 2018, no side effects of medication. -2.4  spine -2.2 of femur , she had repeat study at [facility] 10/2020 and showed T score spine of -2.8 and femur -2.1 . She is seeing Dr. Honor Junes at [facility], had SPEP, Pth and vitamin D done, first infusion of Prolia was 08/2021 and tolerated it well She is going back soon for next infusion    Hyperglycemia: she denies polyphagia, polydipsia or polyuria. A1C was 5.5% to 5.7% A1C 5.4 % and 5.6 % on her last visit  We will recheck it yearl    Mild cognitive dysfunction: CIT was 8 in 2019, down to 4 in 2020 and last checked in 2021 was down to 2.  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 Denies getting lost, balanced her check book  Patient Active Problem  List   Diagnosis Date Noted   Lumbar stenosis 12/16/2020   Benign hypertension with chronic kidney disease, stage III ([place]) 12/22/2018   History of lumbar fusion 07/19/2018   DDD (degenerative disc disease), lumbosacral 07/19/2018   Obesity (BMI 30.0-34.9) 07/19/2018   DOE (dyspnea on exertion) 07/11/2018   Mild cognitive impairment 12/21/2017   Aortic atherosclerosis ([place]) 05/15/2017   Hyperglycemia 11/17/2016   Impingement syndrome of left shoulder 05/12/2016   Insomnia 11/08/2015   Angina pectoris ([place]) 07/10/2015   Seasonal allergic rhinitis 07/09/2015   GERD (gastroesophageal reflux disease) 07/09/2015   Bee sting allergy 07/09/2015   History of knee replacement procedure of right knee 04/29/2015   History of colonic polyps 09/24/2014   Fibrocystic breast 09/24/2014   Primary osteoarthritis of right knee 06/12/2014   Hyperlipidemia 02/09/2013   Atherosclerosis of native coronary artery of native heart with stable angina pectoris ([place]) 02/09/2013   Obstructive sleep apnea on CPAP 02/09/2013   Hypertension 02/09/2013    Past Surgical History:  Procedure Laterality Date   ABDOMINAL HYSTERECTOMY  1970   ANKLE SURGERY Right 1980   ANTERIOR LATERAL LUMBAR FUSION WITH PERCUTANEOUS SCREW 2 LEVEL N/A 12/16/2020   Procedure: L2/3, L3/4 LATERAL LUMBAR FUSION, L2-4 PEDICLE SCREW FIXATION;  Surgeon: Deetta Perla, MD;  Location: [facility];  Service: Neurosurgery;  Laterality: N/A;   BLADDER SURGERY  2012   CARDIAC CATHETERIZATION  2003   [place]   CARDIAC CATHETERIZATION     [place]   CATARACT EXTRACTION W/PHACO Right 02/09/2018   Procedure: CATARACT EXTRACTION PHACO AND INTRAOCULAR LENS PLACEMENT ([place]) RIGHT;  Surgeon: Leandrew Koyanagi, MD;  Location: [place];  Service: Ophthalmology;  Laterality: Right;  sleep apnea   CATARACT EXTRACTION W/PHACO Left 03/09/2018   Procedure: CATARACT EXTRACTION PHACO AND INTRAOCULAR LENS PLACEMENT ([facility]);  Surgeon: Leandrew Koyanagi,  MD;  Location: [place];  Service: Ophthalmology;  Laterality: Left;  IVA TOPICALLEFT   COLONOSCOPY  2008, 2015   Dr. Allen Norris   CYSTOSTOMY     KNEE SURGERY  08/18/2011   arthroscopic right   LUMBAR FUSION     L4-5, S-1   NECK SURGERY  2003   TOTAL KNEE ARTHROPLASTY Right 04/29/2015   Procedure: TOTAL KNEE ARTHROPLASTY;  Surgeon: Leanor Kail, MD;  Location: [facility];  Service: Orthopedics;  Laterality: Right;   TUBAL LIGATION      Family History  Problem Relation Age of Onset   Kidney disease Mother    Hypertension Mother    CAD Father    Healthy Sister    Kidney cancer Neg Hx    Bladder Cancer Neg Hx     Social History   Tobacco Use  Smoking status: Never   Smokeless tobacco: Never   Tobacco comments:    smoking cessation materials not required  Substance Use Topics   Alcohol use: No    Alcohol/week: 0.0 standard drinks     Current Outpatient Medications:    acetaminophen (TYLENOL) 650 MG CR tablet, Take 650 mg by mouth daily as needed for pain., Disp: , Rfl:    Ascorbic Acid (VITAMIN C WITH ROSE HIPS) 1000 MG tablet, Take 1,000 mg by mouth daily. With Zinc, Disp: , Rfl:    Cholecalciferol (VITAMIN D3) 1000 UNITS CAPS, Take 1,000 Units by mouth every morning. , Disp: , Rfl:    docusate sodium (COLACE) 100 MG capsule, Take 1 capsule (100 mg total) by mouth 2 (two) times daily., Disp: 10 capsule, Rfl: 0   EPINEPHrine (EPI-PEN) 0.3 mg/0.3 mL DEVI, Inject 0.3 mg into the muscle as needed (anaphylaxis)., Disp: , Rfl:    ezetimibe (ZETIA) 10 MG tablet, Take 1 tablet (10 mg total) by mouth daily., Disp: 90 tablet, Rfl: 3   fluticasone (FLONASE) 50 MCG/ACT nasal spray, Place 2 sprays into both nostrils at bedtime. USE TWO SPRAY(S) IN EACH NOSTRIL AT BEDTIME, Disp: 48 mL, Rfl: 0   Multiple Vitamins-Minerals (CENTRUM SILVER PO), Take 1 tablet by mouth every morning. , Disp: , Rfl:    nitroGLYCERIN (NITROSTAT) 0.4 MG SL tablet, Place 1 tablet (0.4 mg total) under the  tongue every 5 (five) minutes as needed for chest pain., Disp: 25 tablet, Rfl: 0   Olmesartan-amLODIPine-HCTZ 40-10-12.5 MG TABS, Take 1 tablet by mouth once daily, Disp: 90 tablet, Rfl: 2   omeprazole (PRILOSEC) 20 MG capsule, Take 1 capsule by mouth once daily, Disp: 90 capsule, Rfl: 0   polyethylene glycol (MIRALAX / GLYCOLAX) 17 g packet, Take 17 g by mouth daily., Disp: 14 each, Rfl: 0   sucralfate (CARAFATE) 1 g tablet, Take 1 tablet (1 g total) by mouth 4 (four) times daily., Disp: 60 tablet, Rfl: 0   traZODone (DESYREL) 50 MG tablet, Take 1 tablet (50 mg total) by mouth at bedtime as needed for sleep., Disp: 90 tablet, Rfl: 1  Allergies  Allergen Reactions   Niaspan [Niacin Er] Other (See Comments)    Patient states she can't remember the reaction because it so long ago.   Oxycodone-Acetaminophen Nausea Only   Statins Other (See Comments)    Muscle and joint pain.    I personally reviewed active problem list, medication list, allergies, family history, social history, health maintenance with the patient/caregiver today.   ROS  Constitutional: Negative for fever or weight change.  Respiratory: Negative for cough and shortness of breath.   Cardiovascular: Negative for chest pain or palpitations.  Gastrointestinal: Negative for abdominal pain, no bowel changes.  Musculoskeletal: Negative for gait problem or joint swelling.  Skin: Negative for rash.  Neurological: Negative for dizziness or headache.  No other specific complaints in a complete review of systems (except as listed in HPI above).   Objective  Vitals:   12/29/21 0828  BP: 134/72  Pulse: 84  Resp: 16  SpO2: 100%  Weight: 178 lb (80.7 kg)  Height: 5\' 3"  (1.6 m)    Body mass index is 31.53 kg/m.  Physical Exam  Constitutional: Patient appears well-developed and well-nourished. Obese  No distress.  HEENT: head atraumatic, normocephalic, pupils equal and reactive to light, neck supple Cardiovascular: Normal  rate, regular rhythm and normal heart sounds.  No murmur heard. No BLE edema. Pulmonary/Chest: Effort normal and breath  sounds normal. No respiratory distress. Abdominal: Soft.  There is no tenderness. Psychiatric: Patient has a normal mood and affect. behavior is normal. Judgment and thought content normal.   Recent Results (from the past 2160 hour(s))  Lipase, blood     Status: None   Collection Time: 11/02/21 10:36 PM  Result Value Ref Range   Lipase 40 11 - 51 U/L    Comment: Performed at [facility], [address] [place], [place] [address]  Comprehensive metabolic panel     Status: Abnormal   Collection Time: 11/02/21 10:36 PM  Result Value Ref Range   Sodium 138 135 - 145 mmol/L   Potassium 3.8 3.5 - 5.1 mmol/L   Chloride 103 98 - 111 mmol/L   CO2 28 22 - 32 mmol/L   Glucose, Bld 123 (H) 70 - 99 mg/dL    Comment: Glucose reference range applies only to samples taken after fasting for at least 8 hours.   BUN 23 8 - 23 mg/dL   Creatinine, Ser 1.22 (H) 0.44 - 1.00 mg/dL   Calcium 9.9 8.9 - 10.3 mg/dL   Total Protein 7.3 6.5 - 8.1 g/dL   Albumin 4.3 3.5 - 5.0 g/dL   AST 15 15 - 41 U/L   ALT 16 0 - 44 U/L   Alkaline Phosphatase 74 38 - 126 U/L   Total Bilirubin 0.4 0.3 - 1.2 mg/dL   GFR, Estimated 46 (L) >60 mL/min    Comment: (NOTE) Calculated using the CKD-EPI Creatinine Equation (2021)    Anion gap 7 5 - 15    Comment: Performed at [facility], [address] [place], [place] [address]  CBC     Status: Abnormal   Collection Time: 11/02/21 10:36 PM  Result Value Ref Range   WBC 10.2 4.0 - 10.5 K/uL   RBC 4.07 3.87 - 5.11 MIL/uL   Hemoglobin 11.4 (L) 12.0 - 15.0 g/dL   HCT 33.6 (L) 36.0 - 46.0 %   MCV 82.6 80.0 - 100.0 fL   MCH 28.0 26.0 - 34.0 pg   MCHC 33.9 30.0 - 36.0 g/dL   RDW 12.5 11.5 - 15.5 %   Platelets 300 150 - 400 K/uL   nRBC 0.0 0.0 - 0.2 %    Comment: Performed at [facility], [address] [place], [place]  [address]  Urinalysis, Routine w reflex microscopic Urine, Clean Catch     Status: Abnormal   Collection Time: 11/02/21 10:37 PM  Result Value Ref Range   Color, Urine STRAW (A) YELLOW   APPearance CLEAR (A) CLEAR   Specific Gravity, Urine 1.010 1.005 - 1.030   pH 6.0 5.0 - 8.0   Glucose, UA NEGATIVE NEGATIVE mg/dL   Hgb urine dipstick NEGATIVE NEGATIVE   Bilirubin Urine NEGATIVE NEGATIVE   Ketones, ur NEGATIVE NEGATIVE mg/dL   Protein, ur NEGATIVE NEGATIVE mg/dL   Nitrite NEGATIVE NEGATIVE   Leukocytes,Ua TRACE (A) NEGATIVE   RBC / HPF 0-5 0 - 5 RBC/hpf   WBC, UA 0-5 0 - 5 WBC/hpf   Bacteria, UA NONE SEEN NONE SEEN   Squamous Epithelial / LPF 0-5 0 - 5   Hyaline Casts, UA PRESENT     Comment: Performed at [facility], [address] [place],  [address]    PHQ2/9: Depression screen [facility] 2/9 12/29/2021 08/29/2021 05/13/2021 04/04/2021 10/15/2020  Decreased Interest 0 0 0 0 0  Down, Depressed, Hopeless 0 0 0 0 0  PHQ -  2 Score 0 0 0 0 0  Altered sleeping 0 0 - - -  Tired, decreased energy 0 0 - - -  Change in appetite 0 0 - - -  Feeling bad or failure about yourself  0 0 - - -  Trouble concentrating 0 0 - - -  Moving slowly or fidgety/restless 0 0 - - -  Suicidal thoughts 0 0 - - -  PHQ-9 Score 0 0 - - -  Difficult doing work/chores - Not difficult at all - - -  Some recent data might be hidden    phq 9 is negative   Fall Risk: Fall Risk  12/29/2021 08/29/2021 05/13/2021 04/04/2021 10/15/2020  Falls in the past year? 0 0 0 0 0  Number falls in past yr: 0 0 0 0 0  Injury with Fall? 0 0 0 0 0  Risk for fall due to : No Fall Risks - Impaired balance/gait;Orthopedic patient - -  Follow up Falls prevention discussed - Falls prevention discussed - -      Functional Status Survey: Is the patient deaf or have difficulty hearing?: No Does the patient have difficulty seeing, even when wearing glasses/contacts?: No Does the patient have difficulty concentrating,  remembering, or making decisions?: No Does the patient have difficulty walking or climbing stairs?: No Does the patient have difficulty dressing or bathing?: No Does the patient have difficulty doing errands alone such as visiting a doctor's office or shopping?: No    Assessment & Plan  1. Atherosclerosis of native coronary artery of native heart with stable angina pectoris (HCC)  - Alirocumab (PRALUENT) 150 MG/ML SOAJ; Inject 150 mg into the skin every 14 (fourteen) days.  Dispense: 2 mL; Refill: 12  2. Angina pectoris (HCC)  - Alirocumab (PRALUENT) 150 MG/ML SOAJ; Inject 150 mg into the skin every 14 (fourteen) days.  Dispense: 2 mL; Refill: 12  3. Stage 3a chronic kidney disease (HCC)  GFR is stable  4. Benign hypertension with chronic kidney disease, stage III ([place])   5. Aortic atherosclerosis ([place])  On zetia only   6. Obstructive sleep apnea on CPAP  Continue CPAP use  7. Anemia of chronic disease   8. Mild cognitive impairment   9. Vitamin B12 deficiency  - cyanocobalamin ((VITAMIN B-12)) injection 1,000 mcg  10. Need for shingles vaccine  - Zoster Vaccine Adjuvanted [facility]) injection; Inject 0.5 mLs into the muscle once for 1 dose.  Dispense: 0.5 mL; Refill: 1  11. Breast cancer screening by mammogram  Up to date   76. Need for Tdap vaccination  - Tdap (ADACEL) 03-03-14.5 LF-MCG/0.5 injection; Inject 0.5 mLs into the muscle once for 1 dose.  Dispense: 0.5 mL; Refill: 0  13. Gastroesophageal reflux disease without esophagitis   14. Dyslipidemia   15. Localized osteoporosis without current pathological fracture   16. Primary insomnia  - QUEtiapine (SEROQUEL) 25 MG tablet; Take 1 tablet (25 mg total) by mouth at bedtime.  Dispense: 30 tablet; Refill: 0

## 2021-12-29 ENCOUNTER — Encounter: Payer: Self-pay | Admitting: Family Medicine

## 2021-12-29 ENCOUNTER — Ambulatory Visit (INDEPENDENT_AMBULATORY_CARE_PROVIDER_SITE_OTHER): Payer: Medicare PPO | Admitting: Family Medicine

## 2021-12-29 VITALS — BP 134/72 | HR 84 | Resp 16 | Ht 63.0 in | Wt 178.0 lb

## 2021-12-29 DIAGNOSIS — G4733 Obstructive sleep apnea (adult) (pediatric): Secondary | ICD-10-CM | POA: Diagnosis not present

## 2021-12-29 DIAGNOSIS — F5101 Primary insomnia: Secondary | ICD-10-CM

## 2021-12-29 DIAGNOSIS — Z1231 Encounter for screening mammogram for malignant neoplasm of breast: Secondary | ICD-10-CM

## 2021-12-29 DIAGNOSIS — I209 Angina pectoris, unspecified: Secondary | ICD-10-CM

## 2021-12-29 DIAGNOSIS — N183 Chronic kidney disease, stage 3 unspecified: Secondary | ICD-10-CM

## 2021-12-29 DIAGNOSIS — Z9989 Dependence on other enabling machines and devices: Secondary | ICD-10-CM

## 2021-12-29 DIAGNOSIS — N1831 Chronic kidney disease, stage 3a: Secondary | ICD-10-CM | POA: Diagnosis not present

## 2021-12-29 DIAGNOSIS — I700 Atherosclerosis of aorta: Secondary | ICD-10-CM

## 2021-12-29 DIAGNOSIS — G3184 Mild cognitive impairment, so stated: Secondary | ICD-10-CM | POA: Diagnosis not present

## 2021-12-29 DIAGNOSIS — Z23 Encounter for immunization: Secondary | ICD-10-CM | POA: Diagnosis not present

## 2021-12-29 DIAGNOSIS — I25119 Atherosclerotic heart disease of native coronary artery with unspecified angina pectoris: Secondary | ICD-10-CM | POA: Diagnosis not present

## 2021-12-29 DIAGNOSIS — I129 Hypertensive chronic kidney disease with stage 1 through stage 4 chronic kidney disease, or unspecified chronic kidney disease: Secondary | ICD-10-CM | POA: Diagnosis not present

## 2021-12-29 DIAGNOSIS — K219 Gastro-esophageal reflux disease without esophagitis: Secondary | ICD-10-CM

## 2021-12-29 DIAGNOSIS — I25118 Atherosclerotic heart disease of native coronary artery with other forms of angina pectoris: Secondary | ICD-10-CM | POA: Diagnosis not present

## 2021-12-29 DIAGNOSIS — M816 Localized osteoporosis [Lequesne]: Secondary | ICD-10-CM

## 2021-12-29 DIAGNOSIS — E538 Deficiency of other specified B group vitamins: Secondary | ICD-10-CM | POA: Diagnosis not present

## 2021-12-29 DIAGNOSIS — E785 Hyperlipidemia, unspecified: Secondary | ICD-10-CM

## 2021-12-29 DIAGNOSIS — D638 Anemia in other chronic diseases classified elsewhere: Secondary | ICD-10-CM | POA: Diagnosis not present

## 2021-12-29 MED ORDER — SHINGRIX 50 MCG/0.5ML IM SUSR: 0.5000 mL | Freq: Once | INTRAMUSCULAR | 1 refills | Status: AC

## 2021-12-29 MED ORDER — CYANOCOBALAMIN 1000 MCG/ML IJ SOLN
1000.0000 ug | Freq: Once | INTRAMUSCULAR | Status: AC
  Administered 2021-12-29: 1000 ug via INTRAMUSCULAR

## 2021-12-29 MED ORDER — QUETIAPINE FUMARATE 25 MG PO TABS: 25.0000 mg | ORAL_TABLET | Freq: Every day | ORAL | 0 refills | Status: DC

## 2021-12-29 MED ORDER — TETANUS-DIPHTH-ACELL PERTUSSIS 5-2-15.5 LF-MCG/0.5 IM SUSP: 0.5000 mL | Freq: Once | INTRAMUSCULAR | 0 refills | Status: AC

## 2021-12-29 MED ORDER — PRALUENT 150 MG/ML ~~LOC~~ SOAJ
150.0000 mg | SUBCUTANEOUS | 12 refills | Status: DC
Start: 2021-12-29 — End: 2022-04-27

## 2021-12-30 DIAGNOSIS — Z981 Arthrodesis status: Secondary | ICD-10-CM | POA: Diagnosis not present

## 2021-12-30 DIAGNOSIS — M4326 Fusion of spine, lumbar region: Secondary | ICD-10-CM | POA: Diagnosis not present

## 2022-01-06 DIAGNOSIS — E559 Vitamin D deficiency, unspecified: Secondary | ICD-10-CM | POA: Diagnosis not present

## 2022-01-06 DIAGNOSIS — M810 Age-related osteoporosis without current pathological fracture: Secondary | ICD-10-CM | POA: Diagnosis not present

## 2022-01-12 DIAGNOSIS — Z0100 Encounter for examination of eyes and vision without abnormal findings: Secondary | ICD-10-CM | POA: Diagnosis not present

## 2022-01-12 DIAGNOSIS — H26492 Other secondary cataract, left eye: Secondary | ICD-10-CM | POA: Diagnosis not present

## 2022-01-16 ENCOUNTER — Other Ambulatory Visit: Payer: Self-pay | Admitting: Family Medicine

## 2022-01-16 DIAGNOSIS — K219 Gastro-esophageal reflux disease without esophagitis: Secondary | ICD-10-CM

## 2022-01-16 DIAGNOSIS — J302 Other seasonal allergic rhinitis: Secondary | ICD-10-CM

## 2022-01-16 DIAGNOSIS — G4700 Insomnia, unspecified: Secondary | ICD-10-CM

## 2022-01-19 DIAGNOSIS — H26492 Other secondary cataract, left eye: Secondary | ICD-10-CM | POA: Diagnosis not present

## 2022-01-27 DIAGNOSIS — G4733 Obstructive sleep apnea (adult) (pediatric): Secondary | ICD-10-CM | POA: Diagnosis not present

## 2022-02-09 DIAGNOSIS — M810 Age-related osteoporosis without current pathological fracture: Secondary | ICD-10-CM | POA: Diagnosis not present

## 2022-03-04 ENCOUNTER — Encounter: Payer: Self-pay | Admitting: Gastroenterology

## 2022-03-05 ENCOUNTER — Ambulatory Visit: Payer: Medicare PPO | Admitting: Anesthesiology

## 2022-03-05 ENCOUNTER — Encounter: Payer: Self-pay | Admitting: Gastroenterology

## 2022-03-05 ENCOUNTER — Ambulatory Visit
Admission: RE | Admit: 2022-03-05 | Discharge: 2022-03-05 | Disposition: A | Discharge status: Home / Self Care | Payer: Medicare PPO | Attending: Gastroenterology | Admitting: Gastroenterology

## 2022-03-05 ENCOUNTER — Encounter
Admission: RE | Disposition: A | Discharge status: Home / Self Care | Payer: Self-pay | Source: Home / Self Care | Attending: Gastroenterology

## 2022-03-05 DIAGNOSIS — K2900 Acute gastritis without bleeding: Secondary | ICD-10-CM | POA: Diagnosis not present

## 2022-03-05 DIAGNOSIS — E785 Hyperlipidemia, unspecified: Secondary | ICD-10-CM | POA: Insufficient documentation

## 2022-03-05 DIAGNOSIS — D126 Benign neoplasm of colon, unspecified: Secondary | ICD-10-CM | POA: Diagnosis not present

## 2022-03-05 DIAGNOSIS — K5790 Diverticulosis of intestine, part unspecified, without perforation or abscess without bleeding: Secondary | ICD-10-CM | POA: Diagnosis not present

## 2022-03-05 DIAGNOSIS — I129 Hypertensive chronic kidney disease with stage 1 through stage 4 chronic kidney disease, or unspecified chronic kidney disease: Secondary | ICD-10-CM | POA: Insufficient documentation

## 2022-03-05 DIAGNOSIS — N183 Chronic kidney disease, stage 3 unspecified: Secondary | ICD-10-CM | POA: Insufficient documentation

## 2022-03-05 DIAGNOSIS — K5730 Diverticulosis of large intestine without perforation or abscess without bleeding: Secondary | ICD-10-CM | POA: Diagnosis not present

## 2022-03-05 DIAGNOSIS — K449 Diaphragmatic hernia without obstruction or gangrene: Secondary | ICD-10-CM | POA: Diagnosis not present

## 2022-03-05 DIAGNOSIS — I2510 Atherosclerotic heart disease of native coronary artery without angina pectoris: Secondary | ICD-10-CM | POA: Diagnosis not present

## 2022-03-05 DIAGNOSIS — F419 Anxiety disorder, unspecified: Secondary | ICD-10-CM | POA: Diagnosis not present

## 2022-03-05 DIAGNOSIS — K2960 Other gastritis without bleeding: Secondary | ICD-10-CM | POA: Diagnosis not present

## 2022-03-05 DIAGNOSIS — R112 Nausea with vomiting, unspecified: Secondary | ICD-10-CM | POA: Insufficient documentation

## 2022-03-05 DIAGNOSIS — Z1211 Encounter for screening for malignant neoplasm of colon: Secondary | ICD-10-CM | POA: Diagnosis not present

## 2022-03-05 DIAGNOSIS — K219 Gastro-esophageal reflux disease without esophagitis: Secondary | ICD-10-CM | POA: Diagnosis not present

## 2022-03-05 DIAGNOSIS — K2970 Gastritis, unspecified, without bleeding: Secondary | ICD-10-CM | POA: Diagnosis not present

## 2022-03-05 DIAGNOSIS — I700 Atherosclerosis of aorta: Secondary | ICD-10-CM | POA: Insufficient documentation

## 2022-03-05 DIAGNOSIS — D131 Benign neoplasm of stomach: Secondary | ICD-10-CM | POA: Diagnosis not present

## 2022-03-05 DIAGNOSIS — D122 Benign neoplasm of ascending colon: Secondary | ICD-10-CM | POA: Diagnosis not present

## 2022-03-05 DIAGNOSIS — K635 Polyp of colon: Secondary | ICD-10-CM | POA: Diagnosis not present

## 2022-03-05 DIAGNOSIS — G4730 Sleep apnea, unspecified: Secondary | ICD-10-CM | POA: Insufficient documentation

## 2022-03-05 DIAGNOSIS — K317 Polyp of stomach and duodenum: Secondary | ICD-10-CM | POA: Diagnosis not present

## 2022-03-05 DIAGNOSIS — K640 First degree hemorrhoids: Secondary | ICD-10-CM | POA: Diagnosis not present

## 2022-03-05 DIAGNOSIS — K649 Unspecified hemorrhoids: Secondary | ICD-10-CM | POA: Diagnosis not present

## 2022-03-05 HISTORY — DX: Anxiety disorder, unspecified: F41.9

## 2022-03-05 HISTORY — DX: Other intervertebral disc degeneration, lumbar region: M51.36

## 2022-03-05 HISTORY — DX: Other intervertebral disc degeneration, lumbar region without mention of lumbar back pain or lower extremity pain: M51.369

## 2022-03-05 HISTORY — DX: Chronic kidney disease, unspecified: N18.9

## 2022-03-05 HISTORY — PX: ESOPHAGOGASTRODUODENOSCOPY: SHX5428

## 2022-03-05 HISTORY — DX: Acute myocardial infarction, unspecified: I21.9

## 2022-03-05 HISTORY — PX: COLONOSCOPY: SHX5424

## 2022-03-05 SURGERY — COLONOSCOPY
Anesthesia: General

## 2022-03-05 MED ORDER — SODIUM CHLORIDE 0.9 % IV SOLN: INTRAVENOUS | Status: DC

## 2022-03-05 MED ORDER — PHENYLEPHRINE HCL (PRESSORS) 10 MG/ML IV SOLN
INTRAVENOUS | Status: DC | PRN
  Administered 2022-03-05: 80 ug via INTRAVENOUS
  Administered 2022-03-05: 160 ug via INTRAVENOUS
  Administered 2022-03-05 (×2): 80 ug via INTRAVENOUS

## 2022-03-05 MED ORDER — PROPOFOL 500 MG/50ML IV EMUL
INTRAVENOUS | Status: DC | PRN
  Administered 2022-03-05: 140 ug/kg/min via INTRAVENOUS

## 2022-03-05 MED ORDER — DEXMEDETOMIDINE (PRECEDEX) IN NS 20 MCG/5ML (4 MCG/ML) IV SYRINGE
PREFILLED_SYRINGE | INTRAVENOUS | Status: DC | PRN
  Administered 2022-03-05 (×2): 4 ug via INTRAVENOUS

## 2022-03-05 MED ORDER — PROPOFOL 10 MG/ML IV BOLUS
INTRAVENOUS | Status: DC | PRN
  Administered 2022-03-05: 60 mg via INTRAVENOUS

## 2022-03-05 MED ORDER — LIDOCAINE HCL (CARDIAC) PF 100 MG/5ML IV SOSY
PREFILLED_SYRINGE | INTRAVENOUS | Status: DC | PRN
Start: 2022-03-05 — End: 2022-03-05
  Administered 2022-03-05: 80 mg via INTRAVENOUS

## 2022-03-05 MED ORDER — STERILE WATER FOR IRRIGATION IR SOLN
Status: DC | PRN
  Administered 2022-03-05: 60 mL

## 2022-03-05 NOTE — Anesthesia Preprocedure Evaluation (Addendum)
Anesthesia Evaluation  ?Patient identified by MRN, date of birth, ID band ?Patient awake ? ? ? ?Reviewed: ?Allergy & Precautions, NPO status , Patient's Chart, lab work & pertinent test results ? ?History of Anesthesia Complications ?Negative for: history of anesthetic complications ? ?Airway ?Mallampati: IV ? ? ?Neck ROM: Full ? ? ? Dental ? ?(+) Missing ?  ?Pulmonary ?sleep apnea and Continuous Positive Airway Pressure Ventilation ,  ?  ?Pulmonary exam normal ?breath sounds clear to auscultation ? ? ? ? ? ? Cardiovascular ?hypertension, + CAD  ?Normal cardiovascular exam ?Rhythm:Regular Rate:Normal ? ?ECG 10/20/21: normal ?  ?Neuro/Psych ?PSYCHIATRIC DISORDERS Anxiety Vertigo  ?  ? GI/Hepatic ?GERD  ,  ?Endo/Other  ?negative endocrine ROS ? Renal/GU ?Renal disease (stage III CKD)  ? ?  ?Musculoskeletal ? ? Abdominal ?  ?Peds ? Hematology ? ?(+) Blood dyscrasia, anemia ,   ?Anesthesia Other Findings ?Cardiology note 10/20/21:  ?Essential hypertension ?Blood pressure is well controlled on today's visit. No changes made to the medications. ?? ?Adjustment disorder ?Previous issues with sick husband, he has since recovered ?Less stress at home ?? ?Aortic atherosclerosis ?Prefers not to be on injection medication, PCSK9 inhibitor ?Willing to retry Zetia 10 mg daily ?If tolerated, and if needed potentially could try low-dose Crestor 5 2 days a week with Zetia ?? ?Hyperlipidemia ?Plan as above ? Reproductive/Obstetrics ? ?  ? ? ? ? ? ? ? ? ? ? ? ? ? ?  ?  ? ? ? ? ? ? ? ?Anesthesia Physical ?Anesthesia Plan ? ?ASA: 2 ? ?Anesthesia Plan: General  ? ?Post-op Pain Management:   ? ?Induction: Intravenous ? ?PONV Risk Score and Plan: 3 and Propofol infusion, TIVA and Treatment may vary due to age or medical condition ? ?Airway Management Planned: Natural Airway ? ?Additional Equipment:  ? ?Intra-op Plan:  ? ?Post-operative Plan:  ? ?Informed Consent: I have reviewed the patients History and  Physical, chart, labs and discussed the procedure including the risks, benefits and alternatives for the proposed anesthesia with the patient or authorized representative who has indicated his/her understanding and acceptance.  ? ? ? ? ? ?Plan Discussed with: CRNA ? ?Anesthesia Plan Comments: (LMA/GETA backup discussed.  Patient consented for risks of anesthesia including but not limited to:  ?- adverse reactions to medications ?- damage to eyes, teeth, lips or other oral mucosa ?- nerve damage due to positioning  ?- sore throat or hoarseness ?- damage to heart, brain, nerves, lungs, other parts of body or loss of life ? ?Informed patient about role of CRNA in peri- and intra-operative care.  Patient voiced understanding.)  ? ? ? ? ? ? ?Anesthesia Quick Evaluation ? ?

## 2022-03-05 NOTE — Op Note (Signed)
[facility] ?Gastroenterology ?Patient Name: Jodi Kirby ?Procedure Date: 03/05/2022 10:08 AM ?MRN: [id number] ?Account #: [id number] ?Date of Birth: [birth year] ?Admit Type: Outpatient ?Age: 76 ?Room: [facility] ENDO ROOM 1 ?Gender: Female ?Note Status: Finalized ?Instrument Name: Colonoscope [id number] ?Procedure:             Colonoscopy ?Indications:           Screening for colorectal malignant neoplasm ?Providers:             Annamaria Helling DO, DO ?Referring MD:          Bethena Roys. Ancil Boozer, MD (Referring MD) ?Medicines:             Monitored Anesthesia Care ?Complications:         No immediate complications. Estimated blood loss:  ?                       Minimal. ?Procedure:             Pre-Anesthesia Assessment: ?                       - Prior to the procedure, a History and Physical was  ?                       performed, and patient medications and allergies were  ?                       reviewed. The patient is competent. The risks and  ?                       benefits of the procedure and the sedation options and  ?                       risks were discussed with the patient. All questions  ?                       were answered and informed consent was obtained.  ?                       Patient identification and proposed procedure were  ?                       verified by the physician, the nurse, the anesthetist  ?                       and the technician in the endoscopy suite. Mental  ?                       Status Examination: alert and oriented. Airway  ?                       Examination: normal oropharyngeal airway and neck  ?                       mobility. Respiratory Examination: clear to  ?                       auscultation. CV Examination: RRR, no murmurs, no S3  ?  or S4. Prophylactic Antibiotics: The patient does not  ?                       require prophylactic antibiotics. Prior  ?                       Anticoagulants: The patient has taken no previous   ?                       anticoagulant or antiplatelet agents. ASA Grade  ?                       Assessment: II - A patient with mild systemic disease.  ?                       After reviewing the risks and benefits, the patient  ?                       was deemed in satisfactory condition to undergo the  ?                       procedure. The anesthesia plan was to use monitored  ?                       anesthesia care (MAC). Immediately prior to  ?                       administration of medications, the patient was  ?                       re-assessed for adequacy to receive sedatives. The  ?                       heart rate, respiratory rate, oxygen saturations,  ?                       blood pressure, adequacy of pulmonary ventilation, and  ?                       response to care were monitored throughout the  ?                       procedure. The physical status of the patient was  ?                       re-assessed after the procedure. ?                       After obtaining informed consent, the colonoscope was  ?                       passed under direct vision. Throughout the procedure,  ?                       the patient's blood pressure, pulse, and oxygen  ?                       saturations were monitored continuously. The  ?  Colonoscope was introduced through the anus and  ?                       advanced to the the terminal ileum, with  ?                       identification of the appendiceal orifice and IC  ?                       valve. The colonoscopy was performed without  ?                       difficulty. The patient tolerated the procedure well.  ?                       The quality of the bowel preparation was evaluated  ?                       using the BBPS [facility] Bowel Preparation Scale) with  ?                       scores of: Right Colon = 3, Transverse Colon = 3 and  ?                       Left Colon = 3 (entire mucosa seen well with no  ?                        residual staining, small fragments of stool or opaque  ?                       liquid). The total BBPS score equals 9. The terminal  ?                       ileum, ileocecal valve, appendiceal orifice, and  ?                       rectum were photographed. ?Findings: ?     The perianal and digital rectal examinations were normal. Pertinent  ?     negatives include normal sphincter tone. ?     The terminal ileum appeared normal. Estimated blood loss: none. ?     Multiple small-mouthed diverticula were found in the left colon.  ?     Estimated blood loss: none. ?     Non-bleeding internal hemorrhoids were found during retroflexion. The  ?     hemorrhoids were Grade I (internal hemorrhoids that do not prolapse).  ?     Estimated blood loss: none. ?     Two sessile polyps were found in the cecum. The polyps were 1 mm in  ?     size. These polyps were removed with a cold biopsy forceps. Resection  ?     and retrieval were complete. Estimated blood loss was minimal. ?     Two sessile polyps were found in the ascending colon. The polyps were 2  ?     to 4 mm in size. These polyps were removed with a cold snare. Resection  ?     and retrieval were complete. Estimated blood loss was minimal. ?     The exam  was otherwise without abnormality on direct and retroflexion  ?     views. ?Impression:            - The examined portion of the ileum was normal. ?                       - Diverticulosis in the left colon. ?                       - Non-bleeding internal hemorrhoids. ?                       - Two 1 mm polyps in the cecum, removed with a cold  ?                       biopsy forceps. Resected and retrieved. ?                       - Two 2 to 4 mm polyps in the ascending colon, removed  ?                       with a cold snare. Resected and retrieved. ?                       - The examination was otherwise normal on direct and  ?                       retroflexion views. ?Recommendation:        - Discharge patient to  home. ?                       - Resume previous diet. ?                       - Continue present medications. ?                       - No aspirin, ibuprofen, naproxen, or other  ?                       non-steroidal anti-inflammatory drugs for 5 days after  ?                       polyp removal. ?                       - Await pathology results. ?                       - Repeat colonoscopy for surveillance based on  ?                       pathology results. ?                       - Return to GI office as previously scheduled. ?                       - The findings and recommendations were discussed with  ?  the patient. ?Procedure Code(s):     --- Professional --- ?                       [id number], Colonoscopy, flexible; with removal of  ?                       tumor(s), polyp(s), or other lesion(s) by snare  ?                       technique ?                       45380, 59, Colonoscopy, flexible; with biopsy, single  ?                       or multiple ?Diagnosis Code(s):     --- Professional --- ?                       Z12.11, Encounter for screening for malignant neoplasm  ?                       of colon ?                       K64.0, First degree hemorrhoids ?                       K63.5, Polyp of colon ?                       K57.30, Diverticulosis of large intestine without  ?                       perforation or abscess without bleeding ?CPT copyright 2019 [employer]. All rights reserved. ?The codes documented in this report are preliminary and upon coder review may  ?be revised to meet current compliance requirements. ?Attending Participation: ?     I personally performed the entire procedure. ?Volney American, DO ?Annamaria Helling DO, DO ?03/05/2022 10:58:54 AM ?This report has been signed electronically. ?Number of Addenda: 0 ?Note Initiated On: 03/05/2022 10:08 AM ?Scope Withdrawal Time: 0 hours 15 minutes 38 seconds  ?Total Procedure Duration: 0 hours 20 minutes 7  seconds  ?Estimated Blood Loss:  Estimated blood loss was minimal. ?     [facility] ?

## 2022-03-05 NOTE — Interval H&P Note (Signed)
History and Physical Interval Note: Preprocedure H&P from 03/05/22 ? was reviewed and there was no interval change after seeing and examining the patient.  Written consent was obtained from the patient after discussion of risks, benefits, and alternatives. Patient has consented to proceed with Esophagogastroduodenoscopy and Colonoscopy with possible intervention ? ? ?03/05/2022 ?10:08 AM ? ?Jodi Kirby  has presented today for surgery, with the diagnosis of Gastroesophageal reflux disease, unspecified whether esophagitis present (K21.9) ?Epigastric pain (R10.13) ?Colon cancer screening (Z12.11).  The various methods of treatment have been discussed with the patient and family. After consideration of risks, benefits and other options for treatment, the patient has consented to  Procedure(s): ?COLONOSCOPY (N/A) ?ESOPHAGOGASTRODUODENOSCOPY (EGD) (N/A) as a surgical intervention.  The patient's history has been reviewed, patient examined, no change in status, stable for surgery.  I have reviewed the patient's chart and labs.  Questions were answered to the patient's satisfaction.   ? ? ?Jaynie Collins ? ? ?

## 2022-03-05 NOTE — Transfer of Care (Signed)
Immediate Anesthesia Transfer of Care Note ? ?Patient: Jodi Kirby ? ?Procedure(s) Performed: COLONOSCOPY ?ESOPHAGOGASTRODUODENOSCOPY (EGD) ? ?Patient Location: PACU ? ?Anesthesia Type:General ? ?Level of Consciousness: awake, alert  and oriented ? ?Airway & Oxygen Therapy: Patient Spontanous Breathing ? ?Post-op Assessment: Report given to RN and Post -op Vital signs reviewed and stable ? ?Post vital signs: Reviewed and stable ? ?Last Vitals:  ?Vitals Value Taken Time  ?BP 123/41 03/05/22 1055  ?Temp 35.8 ?C 03/05/22 1055  ?Pulse 71 03/05/22 1055  ?Resp    ?SpO2 100 % 03/05/22 1055  ? ? ?Last Pain:  ?Vitals:  ? 03/05/22 1055  ?TempSrc: Temporal  ?PainSc:   ?   ? ?  ? ?Complications: No notable events documented. ?

## 2022-03-05 NOTE — Op Note (Signed)
[facility] ?Gastroenterology ?Patient Name: Jodi Kirby ?Procedure Date: 03/05/2022 10:08 AM ?MRN: [id number] ?Account #: [id number] ?Date of Birth: 1946-01-27 ?Admit Type: Outpatient ?Age: 76 ?Room: [facility] ENDO ROOM 1 ?Gender: Female ?Note Status: Finalized ?Instrument Name: Upper Endoscope [id number] ?Procedure:             Upper GI endoscopy ?Indications:           Nausea with vomiting ?Providers:             Annamaria Helling DO, DO ?Referring MD:          Bethena Roys. Ancil Boozer, MD (Referring MD) ?Medicines:             Monitored Anesthesia Care ?Complications:         No immediate complications. Estimated blood loss:  ?                       Minimal. ?Procedure:             Pre-Anesthesia Assessment: ?                       - Prior to the procedure, a History and Physical was  ?                       performed, and patient medications and allergies were  ?                       reviewed. The patient is competent. The risks and  ?                       benefits of the procedure and the sedation options and  ?                       risks were discussed with the patient. All questions  ?                       were answered and informed consent was obtained.  ?                       Patient identification and proposed procedure were  ?                       verified by the physician, the nurse, the anesthetist  ?                       and the technician in the endoscopy suite. Mental  ?                       Status Examination: alert and oriented. Airway  ?                       Examination: normal oropharyngeal airway and neck  ?                       mobility. Respiratory Examination: clear to  ?                       auscultation. CV Examination: RRR, no murmurs, no S3  ?  or S4. Prophylactic Antibiotics: The patient does not  ?                       require prophylactic antibiotics. Prior  ?                       Anticoagulants: The patient has taken no previous  ?                        anticoagulant or antiplatelet agents. ASA Grade  ?                       Assessment: III - A patient with severe systemic  ?                       disease. After reviewing the risks and benefits, the  ?                       patient was deemed in satisfactory condition to  ?                       undergo the procedure. The anesthesia plan was to use  ?                       monitored anesthesia care (MAC). Immediately prior to  ?                       administration of medications, the patient was  ?                       re-assessed for adequacy to receive sedatives. The  ?                       heart rate, respiratory rate, oxygen saturations,  ?                       blood pressure, adequacy of pulmonary ventilation, and  ?                       response to care were monitored throughout the  ?                       procedure. The physical status of the patient was  ?                       re-assessed after the procedure. ?                       After obtaining informed consent, the endoscope was  ?                       passed under direct vision. Throughout the procedure,  ?                       the patient's blood pressure, pulse, and oxygen  ?                       saturations were monitored continuously. The Endoscope  ?  was introduced through the mouth, and advanced to the  ?                       second part of duodenum. The upper GI endoscopy was  ?                       accomplished without difficulty. The patient tolerated  ?                       the procedure well. ?Findings: ?     The duodenal bulb, first portion of the duodenum and second portion of  ?     the duodenum were normal. Estimated blood loss: none. ?     A small hiatal hernia was present. Estimated blood loss: none. ?     Multiple less than 5 mm sessile polyps with no bleeding and no stigmata  ?     of recent bleeding were found on the greater curvature of the stomach.  ?     Biopsies were taken with a  cold forceps for histology. Estimated blood  ?     loss was minimal. ?     Localized mild inflammation characterized by erosions and erythema was  ?     found in the gastric antrum. Biopsies were taken with a cold forceps for  ?     Helicobacter pylori testing. Estimated blood loss was minimal. ?     The exam of the stomach was otherwise normal. ?     The Z-line was regular. Estimated blood loss: none. ?     Esophagogastric landmarks were identified: the gastroesophageal junction  ?     was found at 35 cm from the incisors. ?     The exam of the esophagus was otherwise normal. ?     Palantine tonsil noted ?Impression:            - Normal duodenal bulb, first portion of the duodenum  ?                       and second portion of the duodenum. ?                       - Small hiatal hernia. ?                       - Multiple gastric polyps. Biopsied. ?                       - Gastritis. Biopsied. ?                       - Z-line regular. ?                       - Esophagogastric landmarks identified. ?Recommendation:        - Discharge patient to home. ?                       - Resume previous diet. ?                       - Continue present medications. ?                       -  Increase proton pump inhibitor ?                       - Await pathology results. ?                       - Return to GI clinic as previously scheduled. ?                       - The findings and recommendations were discussed with  ?                       the patient. ?Procedure Code(s):     --- Professional --- ?                       [phone], Esophagogastroduodenoscopy, flexible,  ?                       transoral; with biopsy, single or multiple ?Diagnosis Code(s):     --- Professional --- ?                       K44.9, Diaphragmatic hernia without obstruction or  ?                       gangrene ?                       K31.7, Polyp of stomach and duodenum ?                       K29.70, Gastritis, unspecified, without bleeding ?                        R11.2, Nausea with vomiting, unspecified ?CPT copyright 2019 American Medical Association. All rights reserved. ?The codes documented in this report are preliminary and upon coder review may  ?be revised to meet current compliance requirements. ?Attending Participation: ?     I personally performed the entire procedure. ?Volney American, DO ?Annamaria Helling DO, DO ?03/05/2022 10:30:15 AM ?This report has been signed electronically. ?Number of Addenda: 0 ?Note Initiated On: 03/05/2022 10:08 AM ?Estimated Blood Loss:  Estimated blood loss was minimal. ?     [facility] ?

## 2022-03-05 NOTE — Anesthesia Postprocedure Evaluation (Signed)
Anesthesia Post Note ? ?Patient: Jodi Kirby ? ?Procedure(s) Performed: COLONOSCOPY ?ESOPHAGOGASTRODUODENOSCOPY (EGD) ? ?Patient location during evaluation: PACU ?Anesthesia Type: General ?Level of consciousness: awake and alert, oriented and patient cooperative ?Pain management: pain level controlled ?Vital Signs Assessment: post-procedure vital signs reviewed and stable ?Respiratory status: spontaneous breathing, nonlabored ventilation and respiratory function stable ?Cardiovascular status: blood pressure returned to baseline and stable ?Postop Assessment: adequate PO intake ?Anesthetic complications: no ? ? ?No notable events documented. ? ? ?Last Vitals:  ?Vitals:  ? 03/05/22 1105 03/05/22 1115  ?BP: 100/83 100/78  ?Pulse: 62   ?Resp: (!) 22 (!) 24  ?Temp:    ?SpO2: 100% 98%  ?  ?Last Pain:  ?Vitals:  ? 03/05/22 1105  ?TempSrc:   ?PainSc: 0-No pain  ? ? ?  ?  ?  ?  ?  ?  ? ?Reed Breech ? ? ? ? ?

## 2022-03-05 NOTE — H&P (Signed)
? ?Pre-Procedure H&P ?  ?Patient ID: Jodi Kirby is a 76 y.o. female. ? ?Gastroenterology Provider: Jaynie CollinsSteven Michael Kaidan Spengler, DO ? ?Referring Provider: Fransico SettersKim Mills, NP ?PCP: Alba CorySowles, Krichna, MD ? ?Date: 03/05/2022 ? ?HPI ?[name] is a 76 y.o. female who presents today for Esophagogastroduodenoscopy and Colonoscopy for Nausea vomiting and right upper quadrant pain; screening colonoscopy. ?Patient with persistent nausea vomiting and right upper quadrant pain.  This has been ongoing she was seen in the emergency department in January 2023. Symptoms have been doing better more recently. ?CT and right upper quadrant ultrasound were unremarkable aside from diverticulosis.  She had previously been taking BC powders.  Has a 20-year history of intermittent reflux.  She notes no dysphagia or odynophagia, but occasional midsternal burning controlled by PPI after increasing to twice a day. ?Bowel movements have been regular without melena hematochezia diarrhea or constipation ? ?Most recent laboratory data January 2023 iron 66 saturation 21% ferritin 165 B12 195 creatinine 1.2 hemoglobin 11.4 MCV 82.6 platelets 300,000.  H. pylori serum negative ? ?History remote colonoscopy ?No family history of colon cancer or colon polyps ?S/p hysterectomy ? ?Past Medical History:  ?Diagnosis Date  ? Allergic rhinitis, cause unspecified   ? Allergy   ? Anxiety   ? Chronic kidney disease   ? Coronary artery disease   ? DDD (degenerative disc disease), lumbar   ? Dysmetabolic syndrome X   ? Dysuria   ? Esophageal reflux   ? Essential hypertension, benign   ? HNP (herniated nucleus pulposus), lumbar   ? Dr. Yves Dillhasnis [facility]([place])  ? Hyperlipidemia   ? Lumbago   ? Lumbar radiculitis   ? Dr. Yves Dillhasnis [facility]([place])  ? Lump or mass in breast   ? LEFT BREAST  ? Myalgia and myositis, unspecified   ? Myocardial infarction [facility](HCC)   ? Obstructive sleep apnea   ? CPAP  ? Osteoarthrosis, unspecified whether generalized or localized, lower leg   ? Other abnormal  glucose   ? Other and unspecified angina pectoris   ? Personal history of malignant neoplasm of other endocrine glands and related structures   ? Toxic effect of venom(989.5)   ? Unspecified iridocyclitis   ? Unspecified vitamin D deficiency   ? Vertigo 2018  ? 1 episode  ? ? ?Past Surgical History:  ?Procedure Laterality Date  ? ABDOMINAL HYSTERECTOMY  1970  ? ANKLE SURGERY Right 1980  ? ANTERIOR LATERAL LUMBAR FUSION WITH PERCUTANEOUS SCREW 2 LEVEL N/A 12/16/2020  ? Procedure: L2/3, L3/4 LATERAL LUMBAR FUSION, L2-4 PEDICLE SCREW FIXATION;  Surgeon: Lucy Chrisook, Ravonda Brecheen, MD;  Location: [facility] ORS;  Service: Neurosurgery;  Laterality: N/A;  ? BLADDER SURGERY  2012  ? CARDIAC CATHETERIZATION  2003  ? [place]  ? CARDIAC CATHETERIZATION    ? [place]  ? CATARACT EXTRACTION W/PHACO Right 02/09/2018  ? Procedure: CATARACT EXTRACTION PHACO AND INTRAOCULAR LENS PLACEMENT (IOC) RIGHT;  Surgeon: Lockie MolaBrasington, Chadwick, MD;  [facility];  Service: Ophthalmology;  Laterality: Right;  sleep apnea  ? CATARACT EXTRACTION W/PHACO Left 03/09/2018  ? Procedure: CATARACT EXTRACTION PHACO AND INTRAOCULAR LENS PLACEMENT (IOC);  Surgeon: Lockie MolaBrasington, Chadwick, MD;  [facility];  Service: Ophthalmology;  Laterality: Left;  IVA TOPICALLEFT  ? COLONOSCOPY  2008, 2015  ? Dr. Servando SnareWohl  ? CYSTOSTOMY    ? KNEE SURGERY  08/18/2011  ? arthroscopic right  ? LUMBAR FUSION    ? L4-5, S-1  ? NECK SURGERY  2003  ? TOTAL KNEE ARTHROPLASTY Right  04/29/2015  ? Procedure: TOTAL KNEE ARTHROPLASTY;  Surgeon: Erin Sons, MD;  Location: [facility] ORS;  Service: Orthopedics;  Laterality: Right;  ? TUBAL LIGATION    ? ? ?Family History ?No h/o GI disease or malignancy ? ?Review of Systems  ?Constitutional:  Negative for activity change, appetite change, chills, diaphoresis, fatigue, fever and unexpected weight change.  ?HENT:  Negative for trouble swallowing and voice change.   ?Respiratory:  Negative for shortness of breath and wheezing.    ?Cardiovascular:  Negative for chest pain, palpitations and leg swelling.  ?Gastrointestinal:  Positive for abdominal pain, nausea and vomiting. Negative for abdominal distention, anal bleeding, blood in stool, constipation, diarrhea and rectal pain.  ?Musculoskeletal:  Negative for arthralgias and myalgias.  ?Skin:  Negative for color change and pallor.  ?Neurological:  Negative for dizziness, syncope and weakness.  ?Psychiatric/Behavioral:  Negative for confusion.   ?All other systems reviewed and are negative.  ? ?Medications ?No current facility-administered medications on file prior to encounter.  ? ?Current Outpatient Medications on File Prior to Encounter  ?Medication Sig Dispense Refill  ? albuterol (VENTOLIN HFA) 108 (90 Base) MCG/ACT inhaler Inhale into the lungs every 6 (six) hours as needed for wheezing or shortness of breath.    ? alendronate (FOSAMAX) 70 MG tablet Take 70 mg by mouth once a week. Take with a full glass of water on an empty stomach.    ? Ascorbic Acid (VITAMIN C WITH ROSE HIPS) 1000 MG tablet Take 1,000 mg by mouth daily. With Zinc    ? aspirin EC 81 MG tablet Take 81 mg by mouth daily. Swallow whole.    ? ezetimibe (ZETIA) 10 MG tablet Take 1 tablet (10 mg total) by mouth daily. 90 tablet 3  ? Multiple Vitamins-Minerals (CENTRUM SILVER PO) Take 1 tablet by mouth every morning.     ? Olmesartan-amLODIPine-HCTZ 40-10-12.5 MG TABS Take 1 tablet by mouth once daily 90 tablet 2  ? acetaminophen (TYLENOL) 650 MG CR tablet Take 650 mg by mouth daily as needed for pain.    ? Alirocumab (PRALUENT) 150 MG/ML SOAJ Inject 150 mg into the skin every 14 (fourteen) days. 2 mL 12  ? ALPRAZolam (XANAX) 0.5 MG tablet Take 0.5 mg by mouth at bedtime as needed for anxiety.    ? Cholecalciferol (VITAMIN D3) 1000 UNITS CAPS Take 1,000 Units by mouth every morning.     ? docusate sodium (COLACE) 100 MG capsule Take 1 capsule (100 mg total) by mouth 2 (two) times daily. 10 capsule 0  ? EPINEPHrine (EPI-PEN)  0.3 mg/0.3 mL DEVI Inject 0.3 mg into the muscle as needed (anaphylaxis).    ? fluticasone (FLONASE) 50 MCG/ACT nasal spray USE 2 SPRAY(S) IN EACH NOSTRIL AT BEDTIME 48 g 0  ? nitroGLYCERIN (NITROSTAT) 0.4 MG SL tablet Place 1 tablet (0.4 mg total) under the tongue every 5 (five) minutes as needed for chest pain. 25 tablet 0  ? omeprazole (PRILOSEC) 20 MG capsule Take 1 capsule by mouth once daily 90 capsule 0  ? polyethylene glycol (MIRALAX / GLYCOLAX) 17 g packet Take 17 g by mouth daily. 14 each 0  ? QUEtiapine (SEROQUEL) 25 MG tablet Take 1 tablet (25 mg total) by mouth at bedtime. 30 tablet 0  ? ? ?Pertinent medications related to GI and procedure were reviewed by me with the patient prior to the procedure ? ? ?Current Facility-Administered Medications:  ?  0.9 %  sodium chloride infusion, , Intravenous, Continuous, Jaynie Collins, DO,  Last Rate: 20 mL/hr at 03/05/22 0851, New Bag at 03/05/22 0851 ?  ?  ? ?Allergies  ?Allergen Reactions  ? Niaspan [Niacin Er] Other (See Comments)  ?  Patient states she can't remember the reaction because it so long ago.  ? Oxycodone-Acetaminophen Nausea Only  ? Statins Other (See Comments)  ?  Muscle and joint pain.  ? ?Allergies were reviewed by me prior to the procedure ? ?Objective  ? ?Body mass index is 31 kg/m?. ?Vitals:  ? 03/05/22 0834  ?BP: (!) 142/63  ?Pulse: 70  ?Resp: 16  ?Temp: (!) 97.1 ?F (36.2 ?C)  ?TempSrc: Temporal  ?SpO2: 100%  ?Weight: 79.4 kg  ?Height: 5\' 3"  (1.6 m)  ? ? ? ?Physical Exam ?Vitals and nursing note reviewed.  ?Constitutional:   ?   General: She is not in acute distress. ?   Appearance: Normal appearance. She is obese. She is not ill-appearing, toxic-appearing or diaphoretic.  ?HENT:  ?   Head: Normocephalic and atraumatic.  ?   Nose: Nose normal.  ?   Mouth/Throat:  ?   Mouth: Mucous membranes are moist.  ?   Pharynx: Oropharynx is clear.  ?Eyes:  ?   General: No scleral icterus. ?   Extraocular Movements: Extraocular movements intact.   ?Cardiovascular:  ?   Rate and Rhythm: Normal rate and regular rhythm.  ?   Heart sounds: Normal heart sounds. No murmur heard. ?  No friction rub. No gallop.  ?Pulmonary:  ?   Effort: Pulmonary effort is

## 2022-03-06 ENCOUNTER — Encounter: Payer: Self-pay | Admitting: Gastroenterology

## 2022-03-09 LAB — SURGICAL PATHOLOGY

## 2022-04-27 ENCOUNTER — Ambulatory Visit (INDEPENDENT_AMBULATORY_CARE_PROVIDER_SITE_OTHER): Payer: Medicare PPO | Admitting: Family Medicine

## 2022-04-27 ENCOUNTER — Encounter: Payer: Self-pay | Admitting: Family Medicine

## 2022-04-27 VITALS — BP 126/68 | HR 75 | Resp 16 | Ht 63.0 in | Wt 183.0 lb

## 2022-04-27 DIAGNOSIS — I129 Hypertensive chronic kidney disease with stage 1 through stage 4 chronic kidney disease, or unspecified chronic kidney disease: Secondary | ICD-10-CM

## 2022-04-27 DIAGNOSIS — K219 Gastro-esophageal reflux disease without esophagitis: Secondary | ICD-10-CM

## 2022-04-27 DIAGNOSIS — E559 Vitamin D deficiency, unspecified: Secondary | ICD-10-CM

## 2022-04-27 DIAGNOSIS — D638 Anemia in other chronic diseases classified elsewhere: Secondary | ICD-10-CM

## 2022-04-27 DIAGNOSIS — I700 Atherosclerosis of aorta: Secondary | ICD-10-CM | POA: Diagnosis not present

## 2022-04-27 DIAGNOSIS — G3184 Mild cognitive impairment, so stated: Secondary | ICD-10-CM

## 2022-04-27 DIAGNOSIS — N1831 Chronic kidney disease, stage 3a: Secondary | ICD-10-CM | POA: Insufficient documentation

## 2022-04-27 DIAGNOSIS — G4733 Obstructive sleep apnea (adult) (pediatric): Secondary | ICD-10-CM

## 2022-04-27 DIAGNOSIS — Z9989 Dependence on other enabling machines and devices: Secondary | ICD-10-CM

## 2022-04-27 DIAGNOSIS — I25118 Atherosclerotic heart disease of native coronary artery with other forms of angina pectoris: Secondary | ICD-10-CM

## 2022-04-27 DIAGNOSIS — E785 Hyperlipidemia, unspecified: Secondary | ICD-10-CM

## 2022-04-27 DIAGNOSIS — M5137 Other intervertebral disc degeneration, lumbosacral region: Secondary | ICD-10-CM

## 2022-04-27 DIAGNOSIS — E538 Deficiency of other specified B group vitamins: Secondary | ICD-10-CM

## 2022-04-27 DIAGNOSIS — M62838 Other muscle spasm: Secondary | ICD-10-CM

## 2022-04-27 DIAGNOSIS — F5101 Primary insomnia: Secondary | ICD-10-CM

## 2022-04-27 DIAGNOSIS — N183 Chronic kidney disease, stage 3 unspecified: Secondary | ICD-10-CM

## 2022-04-27 MED ORDER — BACLOFEN 10 MG PO TABS: 10.0000 mg | ORAL_TABLET | Freq: Every evening | ORAL | 0 refills | Status: DC | PRN

## 2022-04-27 MED ORDER — OMEPRAZOLE 20 MG PO CPDR: 20.0000 mg | DELAYED_RELEASE_CAPSULE | Freq: Every day | ORAL | 1 refills | Status: DC

## 2022-04-29 DIAGNOSIS — N1831 Chronic kidney disease, stage 3a: Secondary | ICD-10-CM | POA: Diagnosis not present

## 2022-04-29 DIAGNOSIS — D638 Anemia in other chronic diseases classified elsewhere: Secondary | ICD-10-CM | POA: Diagnosis not present

## 2022-04-29 DIAGNOSIS — D649 Anemia, unspecified: Secondary | ICD-10-CM | POA: Diagnosis not present

## 2022-04-29 DIAGNOSIS — E538 Deficiency of other specified B group vitamins: Secondary | ICD-10-CM | POA: Diagnosis not present

## 2022-04-29 DIAGNOSIS — E785 Hyperlipidemia, unspecified: Secondary | ICD-10-CM | POA: Diagnosis not present

## 2022-04-30 ENCOUNTER — Other Ambulatory Visit: Payer: Self-pay

## 2022-04-30 DIAGNOSIS — D649 Anemia, unspecified: Secondary | ICD-10-CM

## 2022-04-30 LAB — COMPLETE METABOLIC PANEL WITH GFR
AG Ratio: 1.7 (calc) (ref 1.0–2.5)
ALT: 17 U/L (ref 6–29)
AST: 14 U/L (ref 10–35)
Albumin: 4.1 g/dL (ref 3.6–5.1)
Alkaline phosphatase (APISO): 68 U/L (ref 37–153)
BUN/Creatinine Ratio: 15 (calc) (ref 6–22)
BUN: 19 mg/dL (ref 7–25)
CO2: 28 mmol/L (ref 20–32)
Calcium: 9.1 mg/dL (ref 8.6–10.4)
Chloride: 104 mmol/L (ref 98–110)
Creat: 1.25 mg/dL — ABNORMAL HIGH (ref 0.60–1.00)
Globulin: 2.4 g/dL (calc) (ref 1.9–3.7)
Glucose, Bld: 98 mg/dL (ref 65–99)
Potassium: 4.2 mmol/L (ref 3.5–5.3)
Sodium: 141 mmol/L (ref 135–146)
Total Bilirubin: 0.3 mg/dL (ref 0.2–1.2)
Total Protein: 6.5 g/dL (ref 6.1–8.1)
eGFR: 45 mL/min/{1.73_m2} — ABNORMAL LOW (ref >=60)

## 2022-04-30 LAB — VITAMIN D 25 HYDROXY (VIT D DEFICIENCY, FRACTURES): Vit D, 25-Hydroxy: 41 ng/mL (ref 30–100)

## 2022-04-30 LAB — LIPID PANEL
Cholesterol: 178 mg/dL (ref <200)
HDL: 45 mg/dL — ABNORMAL LOW (ref >=50)
LDL Cholesterol (Calc): 104 mg/dL (calc) — ABNORMAL HIGH
Non-HDL Cholesterol (Calc): 133 mg/dL (calc) — ABNORMAL HIGH (ref <130)
Total CHOL/HDL Ratio: 4 (calc) (ref <5.0)
Triglycerides: 174 mg/dL — ABNORMAL HIGH (ref <150)

## 2022-04-30 LAB — CBC WITH DIFFERENTIAL/PLATELET
Absolute Monocytes: 715 cells/uL (ref 200–950)
Basophils Absolute: 20 cells/uL (ref 0–200)
Basophils Relative: 0.3 %
Eosinophils Absolute: 91 cells/uL (ref 15–500)
Eosinophils Relative: 1.4 %
HCT: 31.6 % — ABNORMAL LOW (ref 35.0–45.0)
Hemoglobin: 10.3 g/dL — ABNORMAL LOW (ref 11.7–15.5)
Lymphs Abs: 1580 cells/uL (ref 850–3900)
MCH: 28.1 pg (ref 27.0–33.0)
MCHC: 32.6 g/dL (ref 32.0–36.0)
MCV: 86.1 fL (ref 80.0–100.0)
MPV: 10.9 fL (ref 7.5–12.5)
Monocytes Relative: 11 %
Neutro Abs: 4095 cells/uL (ref 1500–7800)
Neutrophils Relative %: 63 %
Platelets: 230 10*3/uL (ref 140–400)
RBC: 3.67 10*6/uL — ABNORMAL LOW (ref 3.80–5.10)
RDW: 12.4 % (ref 11.0–15.0)
Total Lymphocyte: 24.3 %
WBC: 6.5 10*3/uL (ref 3.8–10.8)

## 2022-04-30 LAB — IRON,TIBC AND FERRITIN PANEL
%SAT: 26 % (calc) (ref 16–45)
Ferritin: 131 ng/mL (ref 16–288)
Iron: 76 ug/dL (ref 45–160)
TIBC: 288 mcg/dL (calc) (ref 250–450)

## 2022-04-30 LAB — TEST AUTHORIZATION

## 2022-04-30 LAB — B12 AND FOLATE PANEL
Folate: 13.2 ng/mL
Vitamin B-12: 860 pg/mL (ref 200–1100)

## 2022-05-11 DIAGNOSIS — G4733 Obstructive sleep apnea (adult) (pediatric): Secondary | ICD-10-CM | POA: Diagnosis not present

## 2022-05-14 ENCOUNTER — Ambulatory Visit: Payer: BC Managed Care – PPO

## 2022-05-18 ENCOUNTER — Other Ambulatory Visit: Payer: Self-pay | Admitting: Family Medicine

## 2022-05-18 DIAGNOSIS — M62838 Other muscle spasm: Secondary | ICD-10-CM

## 2022-05-18 DIAGNOSIS — M5137 Other intervertebral disc degeneration, lumbosacral region: Secondary | ICD-10-CM

## 2022-05-20 NOTE — Progress Notes (Signed)
Annual Wellness Visit     Patient: Jodi Kirby, Female    DOB: 04-29-1946, 76 y.o.   MRN: [id number]  Subjective  No chief complaint on file.   Jodi Kirby is a 76 y.o. female who presents today for her Annual Wellness Visit. She reports consuming a general diet. Home exercise routine includes walking 20 hrs per day. She generally feels well. She reports sleeping fairly well.( CPAP machine is bothering her nose ) She does not have additional problems to discuss today.   HPI  Vision:Within last year   Patient Active Problem List   Diagnosis Date Noted   Vitamin D deficiency 04/27/2022   Stage 3a chronic kidney disease (HCC) 04/27/2022   Vitamin B12 deficiency 12/29/2021   Anemia of chronic disease 11/19/2021   Epigastric pain 11/19/2021   Lumbar stenosis 12/16/2020   Benign hypertension with chronic kidney disease, stage III (HCC) 12/22/2018   History of lumbar fusion 07/19/2018   DDD (degenerative disc disease), lumbosacral 07/19/2018   Obesity (BMI 30.0-34.9) 07/19/2018   DOE (dyspnea on exertion) 07/11/2018   Mild cognitive impairment 12/21/2017   Aortic atherosclerosis (HCC) 05/15/2017   Hyperglycemia 11/17/2016   Impingement syndrome of left shoulder 05/12/2016   Insomnia 11/08/2015   Angina pectoris (HCC) 07/10/2015   Seasonal allergic rhinitis 07/09/2015   Gastroesophageal reflux disease without esophagitis 07/09/2015   Bee sting allergy 07/09/2015   History of knee replacement procedure of right knee 04/29/2015   History of colonic polyps 09/24/2014   Fibrocystic breast 09/24/2014   Primary osteoarthritis of right knee 06/12/2014   Dyslipidemia 02/09/2013   Atherosclerosis of native coronary artery of native heart with stable angina pectoris (HCC) 02/09/2013   Obstructive sleep apnea on CPAP 02/09/2013   Hypertension 02/09/2013   Past Medical History:  Diagnosis Date   Allergic rhinitis, cause unspecified    Allergy    Anxiety    Chronic  kidney disease    Coronary artery disease    DDD (degenerative disc disease), lumbar    Dysmetabolic syndrome X    Dysuria    Esophageal reflux    Essential hypertension, benign    HNP (herniated nucleus pulposus), lumbar    Dr. Yves Dill [facility])   Hyperlipidemia    Lumbago    Lumbar radiculitis    Dr. Yves Dill [facility])   Lump or mass in breast    LEFT BREAST   Myalgia and myositis, unspecified    Myocardial infarction (HCC)    Obstructive sleep apnea    CPAP   Osteoarthrosis, unspecified whether generalized or localized, lower leg    Other abnormal glucose    Other and unspecified angina pectoris    Personal history of malignant neoplasm of other endocrine glands and related structures    Toxic effect of venom(989.5)    Unspecified iridocyclitis    Unspecified vitamin D deficiency    Vertigo 2018   1 episode   Past Surgical History:  Procedure Laterality Date   ABDOMINAL HYSTERECTOMY  1970   ANKLE SURGERY Right 1980   ANTERIOR LATERAL LUMBAR FUSION WITH PERCUTANEOUS SCREW 2 LEVEL N/A 12/16/2020   Procedure: L2/3, L3/4 LATERAL LUMBAR FUSION, L2-4 PEDICLE SCREW FIXATION;  Surgeon: Lucy Chris, MD;  Location: [facility] ORS;  Service: Neurosurgery;  Laterality: N/A;   BLADDER SURGERY  2012   CARDIAC CATHETERIZATION  2003   [place]   CARDIAC CATHETERIZATION     [place]   CATARACT EXTRACTION W/PHACO Right 02/09/2018   Procedure: CATARACT EXTRACTION PHACO AND INTRAOCULAR  LENS PLACEMENT (IOC) RIGHT;  Surgeon: Lockie Mola, MD;  Location: [facility];  Service: Ophthalmology;  Laterality: Right;  sleep apnea   CATARACT EXTRACTION W/PHACO Left 03/09/2018   Procedure: CATARACT EXTRACTION PHACO AND INTRAOCULAR LENS PLACEMENT (IOC);  Surgeon: Lockie Mola, MD;  [facility];  Service: Ophthalmology;  Laterality: Left;  IVA TOPICALLEFT   COLONOSCOPY  2008, 2015   Dr. Servando Snare   COLONOSCOPY N/A 03/05/2022   Procedure: COLONOSCOPY;  Surgeon: Jaynie Collins,  DO;  [facility];  Service: Gastroenterology;  Laterality: N/A;   CYSTOSTOMY     ESOPHAGOGASTRODUODENOSCOPY N/A 03/05/2022   Procedure: ESOPHAGOGASTRODUODENOSCOPY (EGD);  Surgeon: Jaynie Collins, DO;  Location: [facility];  Service: Gastroenterology;  Laterality: N/A;   KNEE SURGERY  08/18/2011   arthroscopic right   LUMBAR FUSION     L4-5, S-1   NECK SURGERY  2003   TOTAL KNEE ARTHROPLASTY Right 04/29/2015   Procedure: TOTAL KNEE ARTHROPLASTY;  Surgeon: Erin Sons, MD;  Location: [facility] ORS;  Service: Orthopedics;  Laterality: Right;   TUBAL LIGATION        Medications: Outpatient Medications Prior to Visit  Medication Sig   acetaminophen (TYLENOL) 650 MG CR tablet Take 650 mg by mouth daily as needed for pain.   albuterol (VENTOLIN HFA) 108 (90 Base) MCG/ACT inhaler Inhale into the lungs every 6 (six) hours as needed for wheezing or shortness of breath.   ALPRAZolam (XANAX) 0.5 MG tablet Take 0.5 mg by mouth at bedtime as needed for anxiety.   Ascorbic Acid (VITAMIN C WITH ROSE HIPS) 1000 MG tablet Take 1,000 mg by mouth daily. With Zinc   aspirin EC 81 MG tablet Take 81 mg by mouth daily. Swallow whole.   baclofen (LIORESAL) 10 MG tablet TAKE 1 TABLET BY MOUTH AT BEDTIME AS NEEDED FOR MUSCLE SPASM   Cholecalciferol (VITAMIN D3) 1000 UNITS CAPS Take 1,000 Units by mouth every morning.    denosumab (PROLIA) 60 MG/ML SOSY injection Inject 60 mg into the skin every 6 (six) months.   EPINEPHrine (EPI-PEN) 0.3 mg/0.3 mL DEVI Inject 0.3 mg into the muscle as needed (anaphylaxis).   ezetimibe (ZETIA) 10 MG tablet Take 1 tablet (10 mg total) by mouth daily.   fluticasone (FLONASE) 50 MCG/ACT nasal spray USE 2 SPRAY(S) IN EACH NOSTRIL AT BEDTIME   Multiple Vitamins-Minerals (CENTRUM SILVER PO) Take 1 tablet by mouth every morning.    nitroGLYCERIN (NITROSTAT) 0.4 MG SL tablet Place 1 tablet (0.4 mg total) under the tongue every 5 (five) minutes as needed for chest pain.    Olmesartan-amLODIPine-HCTZ 40-10-12.5 MG TABS Take 1 tablet by mouth once daily   polyethylene glycol (MIRALAX / GLYCOLAX) 17 g packet Take 17 g by mouth daily.   omeprazole (PRILOSEC) 20 MG capsule Take 1 capsule (20 mg total) by mouth daily. (Patient not taking: Reported on 05/21/2022)   No facility-administered medications prior to visit.    Allergies  Allergen Reactions   Niaspan [Niacin Er] Other (See Comments)    Patient states she can't remember the reaction because it so long ago.   Oxycodone-Acetaminophen Nausea Only   Statins Other (See Comments)    Muscle and joint pain.    Patient Care Team: Alba Cory, MD as PCP - General Mariah Milling Tollie Pizza, MD as Consulting Physician (Cardiology) Vanna Scotland, MD as Consulting Physician (Urology) Stanton Kidney, MD as Consulting Physician (Gastroenterology)  ROS  Constitutional: Negative for fever or weight change.  Respiratory: Negative for cough  and shortness of breath.   Cardiovascular: Negative for chest pain or palpitations.  Gastrointestinal: Negative for abdominal pain, no bowel changes.  Musculoskeletal: Negative for gait problem or joint swelling.  Skin: Negative for rash.  Neurological: Negative for dizziness or headache.  No other specific complaints in a complete review of systems (except as listed in HPI above).     Objective  BP 110/68 (BP Location: Right Arm, Patient Position: Sitting, Cuff Size: Normal)   Pulse 82   Temp 98.2 F (36.8 C) (Oral)   Resp 14   Ht 5\' 3"  (1.6 m) Comment: measured  Wt 185 lb 1.6 oz (84 kg)   SpO2 100%   BMI 32.79 kg/m  BP Readings from Last 3 Encounters:  05/21/22 110/68  04/27/22 126/68  03/05/22 100/78      Physical Exam  Constitutional: Patient appears well-developed and well-nourished. Obese  No distress.  HEENT: head atraumatic, normocephalic, pupils equal and reactive to light, neck supple Cardiovascular: Normal rate, regular rhythm and normal heart  sounds.  No murmur heard. No BLE edema. Pulmonary/Chest: Effort normal and breath sounds normal. No respiratory distress. Abdominal: Soft.  There is no tenderness. Psychiatric: Patient has a normal mood and affect. behavior is normal. Judgment and thought content normal.       05/21/2022    1:52 PM  MMSE - Mini Mental State Exam  Orientation to time 5  Orientation to Place 5  Registration 3  Attention/ Calculation 5  Recall 3  Language- name 2 objects 2  Language- repeat 1  Language- follow 3 step command 3  Language- read & follow direction 1  Write a sentence 1  Copy design 1  Total score 30    Most recent functional status assessment:    05/21/2022    1:50 PM  In your present state of health, do you have any difficulty performing the following activities:  Hearing? 0  Vision? 0  Difficulty concentrating or making decisions? 0  Walking or climbing stairs? 0  Dressing or bathing? 0  Doing errands, shopping? 0  Preparing Food and eating ? N  Using the Toilet? N  In the past six months, have you accidently leaked urine? N  Do you have problems with loss of bowel control? N  Managing your Medications? N  Managing your Finances? N  Housekeeping or managing your Housekeeping? N   Most recent fall risk assessment:    05/21/2022    1:49 PM  Fall Risk   Falls in the past year? 0  Risk for fall due to : No Fall Risks  Follow up Falls prevention discussed;Education provided;Falls evaluation completed    Most recent depression screenings:    05/21/2022    1:40 PM 04/27/2022    8:45 AM  PHQ 2/9 Scores  PHQ - 2 Score 0 0  PHQ- 9 Score 1 0   Most recent cognitive screening:    05/02/2020    8:32 AM  6CIT Screen  What Year? 0 points  What month? 0 points  What time? 0 points  Count back from 20 0 points  Months in reverse 0 points  Repeat phrase 2 points  Total Score 2 points   Most recent Audit-C alcohol use screening    05/21/2022    1:39 PM  Alcohol Use  Disorder Test (AUDIT)  1. How often do you have a drink containing alcohol? 0   A score of 3 or more in women, and 4 or more in men  indicates increased risk for alcohol abuse, EXCEPT if all of the points are from question 1      Assessment & Plan   Annual wellness visit done today including the all of the following: Reviewed patient's Family Medical History Reviewed and updated list of patient's medical providers Assessment of cognitive impairment was done Assessed patient's functional ability Established a written schedule for health screening [place] Completed and Reviewed  Exercise Activities and Dietary recommendations  Goals      DIET - INCREASE WATER INTAKE     Recommend to drink at least 6-8 8oz glasses of water per day.     Weight (lb) < 170 lb (77.1 kg)     Pt sates she would like to lose weight with healthy eating and physical activity         Immunization History  Administered Date(s) Administered   Influenza, High Dose Seasonal PF 09/15/2016, 07/07/2017   Influenza-Unspecified 08/17/2014   Janssen (J&J) SARS-COV-2 Vaccination 08/07/2020   Pneumococcal Conjugate-13 12/05/2014   Pneumococcal Polysaccharide-23 08/16/2007, 02/02/2013   Tdap 02/23/2008   Zoster, Live 04/04/2008    Health Maintenance  Topic Date Due   Zoster Vaccines- Shingrix (1 of 2) Never done   TETANUS/TDAP  02/22/2018   COVID-19 Vaccine (2 - Booster for Janssen series) 06/06/2022 (Originally 10/02/2020)   Hepatitis C Screening  08/25/2029 (Originally 09/18/1964)   INFLUENZA VACCINE  06/02/2022   MAMMOGRAM  10/20/2022   Pneumonia Vaccine 29+ Years old  Completed   DEXA SCAN  Completed   HPV VACCINES  Aged Out   COLONOSCOPY (Pts 45-77yrs Insurance coverage will need to be confirmed)  Discontinued     Discussed health benefits of physical activity, and encouraged her to engage in regular exercise appropriate for her age and condition.    Problem List Items  Addressed This Visit   None Visit Diagnoses     Encounter for Medicare annual wellness exam    -  Primary       No follow-ups on file.     Loistine Chance, MD

## 2022-05-21 ENCOUNTER — Encounter: Payer: Self-pay | Admitting: Family Medicine

## 2022-05-21 ENCOUNTER — Ambulatory Visit (INDEPENDENT_AMBULATORY_CARE_PROVIDER_SITE_OTHER): Payer: Medicare PPO | Admitting: Family Medicine

## 2022-05-21 VITALS — BP 110/68 | HR 82 | Temp 98.2°F | Resp 14 | Ht 63.0 in | Wt 185.1 lb

## 2022-05-21 DIAGNOSIS — Z23 Encounter for immunization: Secondary | ICD-10-CM

## 2022-05-21 DIAGNOSIS — Z0000 Encounter for general adult medical examination without abnormal findings: Secondary | ICD-10-CM

## 2022-05-21 DIAGNOSIS — Z1231 Encounter for screening mammogram for malignant neoplasm of breast: Secondary | ICD-10-CM | POA: Diagnosis not present

## 2022-05-21 MED ORDER — TETANUS-DIPHTH-ACELL PERTUSSIS 5-2-15.5 LF-MCG/0.5 IM SUSP: 0.5000 mL | Freq: Once | INTRAMUSCULAR | 0 refills | Status: AC

## 2022-06-19 ENCOUNTER — Other Ambulatory Visit: Payer: Self-pay | Admitting: Family Medicine

## 2022-06-19 DIAGNOSIS — M5137 Other intervertebral disc degeneration, lumbosacral region: Secondary | ICD-10-CM

## 2022-06-19 DIAGNOSIS — M51379 Other intervertebral disc degeneration, lumbosacral region without mention of lumbar back pain or lower extremity pain: Secondary | ICD-10-CM

## 2022-06-19 DIAGNOSIS — M62838 Other muscle spasm: Secondary | ICD-10-CM

## 2022-08-13 DIAGNOSIS — M810 Age-related osteoporosis without current pathological fracture: Secondary | ICD-10-CM | POA: Diagnosis not present

## 2022-08-27 NOTE — Progress Notes (Signed)
Name: Jodi Kirby   MRN: [id number]    DOB: 1946/04/22   Date:08/28/2022       Progress Note  Subjective  Chief Complaint  Follow Up  HPI  OSA: she usually wears her CPAP every night.  Trazodone caused nausea, seroquel made her groggy, we will try baclofen to help with spasms and see if it helps her sleep, getting about 6 hours of sleep per night   Insomnia: she was only sleeping sleeping for about 4 hours ( it started a couple of months ago),we gave her seroquel but it caused her to feel groggy, currently she has been taking baclofen prn and sleeps about 6 hours per night    DDD lumbar spine:  History of spinal fusion and L3-4 .and revision in Feb 2022, she states surgery seems to have worked, she states pain is finally getting better, able to get up and walk now. She states back pain is intermittent, she feels sore and stiff and pain can go to 8/10, currently is 7/10, she states worse when sitting better when moving around    IMPRESSION: 10/09/2020  1. Mildly progressive disc degeneration at L2-3 with moderate spinal stenosis and mild-to-moderate left greater than right neural foraminal and lateral recess stenosis. 2. Postoperative changes at L3-4 and L4-5 with unchanged residual spinal and neural foraminal stenosis at L3-4.   HTN with CKI stage III : she is back on one pill Tribenzor  no recent episodes of chest pain or palpitation, denies orthopnea or dizziness  . BP has been at goal here and also at home. Denies dizziness  GFR was slightly lower in June at 45 , usually between 49-55 , she  denies pruritis, states good urine output , she has intermittent leg cramps she states not as frequent since she started to stretch and drink more water. She also states when she eats bananas it helps    Hyperlipidemia/aorta atherosclerosis : unable to tolerate statins because it causes myopahty Dr. Rockey Situ started her on Zetia 07/2018 and is tolerating it well, however LDL still not at goal,  last level was 104 in June 23  . We tried Vascepa but too costly, we also discussed PCSK9 and rx was sent in the past but she states too expensive and never started medication  Anemia: normal iron storage,   low B12 and she is taking supplementation, just started supplementation. She likely has anemia of chronic disease. Slight drop on her last labs    Angina Pectoris: she has CAD, mild disease, seeing Dr. Rockey Situ. Taking aspirin and ARB, not on beta-blocker because of bradycardia and not on statin because of intolerance, but is now on Zetia and last LDL was still above 70 - it was 104    Osteoporosis : diagnosed in 2016 - and was started on Fosamax, previous bone density in 2018, no side effects of medication. -2.4 spine -2.2 of femur , she had repeat study at [facility] 10/2020 and showed T score spine of -2.8 and femur -2.1 . She is seeing [facility] Honor Junes at [facility], had SPEP, Pth and vitamin D done, first infusion of Prolia was 08/2021 , last dose April 2023   Hyperglycemia: she denies polyphagia, polydipsia or polyuria. A1C was 5.5% to 5.7% A1C 5.4 % and 5.6 % ,we are no longing checking levels unless she develops symptoms of diabetes    Mild cognitive dysfunction: CIT was 8 in 2019, down to 4 in 2020 and last checked in 2021 was down to 2,  her MMS 05/2022 was normal at 30 . She has been doing  cross work puzzles, Loss adjuster, chartered. She still drives, take medications on her own, pays her bills, groceries shopping . Unchanged   GERD: doing well on prn omeprazole that she gets otc   Patient Active Problem List   Diagnosis Date Noted   Vitamin D deficiency 04/27/2022   Stage 3a chronic kidney disease ([place]) 04/27/2022   Vitamin B12 deficiency 12/29/2021   Anemia of chronic disease 11/19/2021   Lumbar stenosis 12/16/2020   Benign hypertension with chronic kidney disease, stage III ([place]) 12/22/2018   History of lumbar fusion 07/19/2018   DDD (degenerative disc disease), lumbosacral 07/19/2018    Obesity (BMI 30.0-34.9) 07/19/2018   DOE (dyspnea on exertion) 07/11/2018   Mild cognitive impairment 12/21/2017   Aortic atherosclerosis ([place]) 05/15/2017   Hyperglycemia 11/17/2016   Impingement syndrome of left shoulder 05/12/2016   Insomnia 11/08/2015   Angina pectoris ([place]) 07/10/2015   Seasonal allergic rhinitis 07/09/2015   Gastroesophageal reflux disease without esophagitis 07/09/2015   Bee sting allergy 07/09/2015   History of knee replacement procedure of right knee 04/29/2015   History of colonic polyps 09/24/2014   Fibrocystic breast 09/24/2014   Primary osteoarthritis of right knee 06/12/2014   Dyslipidemia 02/09/2013   Atherosclerosis of native coronary artery of native heart with stable angina pectoris ([place]) 02/09/2013   Obstructive sleep apnea on CPAP 02/09/2013   Hypertension 02/09/2013    Past Surgical History:  Procedure Laterality Date   ABDOMINAL HYSTERECTOMY  1970   ANKLE SURGERY Right 1980   ANTERIOR LATERAL LUMBAR FUSION WITH PERCUTANEOUS SCREW 2 LEVEL N/A 12/16/2020   Procedure: L2/3, L3/4 LATERAL LUMBAR FUSION, L2-4 PEDICLE SCREW FIXATION;  Surgeon: Deetta Perla, MD;  Location: [facility] OR[facility];  Service: Neurosurgery;  Laterality: N/A;   BLADDER SURGERY  2012   CARDIAC CATHETERIZATION  2003   [place]   CARDIAC CATHETERIZATION     [place]   CATARACT EXTRACTION W/PHACO Right 02/09/2018   Procedure: CATARACT EXTRACTION PHACO AND INTRAOCULAR LENS PLACEMENT ([place]) RIGHT;  Surgeon: Leandrew Koyanagi, MD;  Location: [place];  Service: Ophthalmology;  Laterality: Right;  sleep apnea   CATARACT EXTRACTION W/PHACO Left 03/09/2018   Procedure: CATARACT EXTRACTION PHACO AND INTRAOCULAR LENS PLACEMENT ([facility]);  Surgeon: Leandrew Koyanagi, MD;  Location: [place];  Service: Ophthalmology;  Laterality: Left;  IVA TOPICALLEFT   COLONOSCOPY  2008, 2015   Dr. Allen Norris   COLONOSCOPY N/A 03/05/2022   Procedure: COLONOSCOPY;  Surgeon: Annamaria Helling, DO;   Location[facility];  Service: Gastroenterology;  Laterality: N/A;   CYSTOSTOMY     ESOPHAGOGASTRODUODENOSCOPY N/A 03/05/2022   Procedure: ESOPHAGOGASTRODUODENOSCOPY (EGD);  Surgeon: Annamaria Helling, DO;  Location: [facility] ENDOSCOPY;  Service: Gastroenterology;  Laterality: N/A;   KNEE SURGERY  08/18/2011   arthroscopic right   LUMBAR FUSION     L4-5, S-1   NECK SURGERY  2003   TOTAL KNEE ARTHROPLASTY Right 04/29/2015   Procedure: TOTAL KNEE ARTHROPLASTY;  Surgeon: Leanor Kail, MD;  Location: [facility] ORS;  Service: Orthopedics;  Laterality: Right;   TUBAL LIGATION      Family History  Problem Relation Age of Onset   Kidney disease Mother    Hypertension Mother    CAD Father    Healthy Sister    Kidney cancer Neg Hx    Bladder Cancer Neg Hx     Social History   Tobacco Use   Smoking status: Never   Smokeless tobacco: Never  Tobacco comments:    smoking cessation materials not required  Substance Use Topics   Alcohol use: No    Alcohol/week: 0.0 standard drinks of alcohol     Current Outpatient Medications:    acetaminophen (TYLENOL) 650 MG CR tablet, Take 650 mg by mouth daily as needed for pain., Disp: , Rfl:    albuterol (VENTOLIN HFA) 108 (90 Base) MCG/ACT inhaler, Inhale into the lungs every 6 (six) hours as needed for wheezing or shortness of breath., Disp: , Rfl:    ALPRAZolam (XANAX) 0.5 MG tablet, Take 0.5 mg by mouth at bedtime as needed for anxiety., Disp: , Rfl:    Ascorbic Acid (VITAMIN C WITH ROSE HIPS) 1000 MG tablet, Take 1,000 mg by mouth daily. With Zinc, Disp: , Rfl:    aspirin EC 81 MG tablet, Take 81 mg by mouth daily. Swallow whole., Disp: , Rfl:    baclofen (LIORESAL) 10 MG tablet, TAKE 1 TABLET BY MOUTH AT BEDTIME AS NEEDED FOR MUSCLE SPASM, Disp: 90 tablet, Rfl: 0   Cholecalciferol (VITAMIN D3) 1000 UNITS CAPS, Take 1,000 Units by mouth every morning. , Disp: , Rfl:    denosumab (PROLIA) 60 MG/ML SOSY injection, Inject 60 mg into the skin  every 6 (six) months., Disp: , Rfl:    EPINEPHrine (EPI-PEN) 0.3 mg/0.3 mL DEVI, Inject 0.3 mg into the muscle as needed (anaphylaxis)., Disp: , Rfl:    ezetimibe (ZETIA) 10 MG tablet, Take 1 tablet (10 mg total) by mouth daily., Disp: 90 tablet, Rfl: 3   fluticasone (FLONASE) 50 MCG/ACT nasal spray, USE 2 SPRAY(S) IN EACH NOSTRIL AT BEDTIME, Disp: 48 g, Rfl: 0   Multiple Vitamins-Minerals (CENTRUM SILVER PO), Take 1 tablet by mouth every morning. , Disp: , Rfl:    nitroGLYCERIN (NITROSTAT) 0.4 MG SL tablet, Place 1 tablet (0.4 mg total) under the tongue every 5 (five) minutes as needed for chest pain., Disp: 25 tablet, Rfl: 0   Olmesartan-amLODIPine-HCTZ 40-10-12.5 MG TABS, Take 1 tablet by mouth once daily, Disp: 90 tablet, Rfl: 2   omeprazole (PRILOSEC) 20 MG capsule, Take 20 mg by mouth daily., Disp: , Rfl:    polyethylene glycol (MIRALAX / GLYCOLAX) 17 g packet, Take 17 g by mouth daily., Disp: 14 each, Rfl: 0  Allergies  Allergen Reactions   Niaspan [Niacin Er] Other (See Comments)    Patient states she can't remember the reaction because it so long ago.   Oxycodone-Acetaminophen Nausea Only   Statins Other (See Comments)    Muscle and joint pain.    I personally reviewed active problem list, medication list, allergies, family history, social history, health maintenance with the patient/caregiver today.   ROS  Constitutional: Negative for fever or weight change.  Respiratory: Negative for cough and shortness of breath.   Cardiovascular: Negative for chest pain or palpitations.  Gastrointestinal: Negative for abdominal pain, no bowel changes.  Musculoskeletal: Negative for gait problem or joint swelling.  Skin: Negative for rash.  Neurological: Negative for dizziness or headache.  No other specific complaints in a complete review of systems (except as listed in HPI above).   Objective  Vitals:   08/28/22 0926  BP: 112/70  Pulse: 83  Resp: 14  Temp: 97.8 F (36.6 C)   TempSrc: Oral  SpO2: 98%  Weight: 183 lb 6.4 oz (83.2 kg)  Height: 5\' 3"  (1.6 m)    Body mass index is 32.49 kg/m.  Physical Exam  Constitutional: Patient appears well-developed and well-nourished. Obese  No  distress.  HEENT: head atraumatic, normocephalic, pupils equal and reactive to light, neck supple Cardiovascular: Normal rate, regular rhythm and normal heart sounds.  No murmur heard. No BLE edema. Pulmonary/Chest: Effort normal and breath sounds normal. No respiratory distress. Abdominal: Soft.  There is no tenderness. Psychiatric: Patient has a normal mood and affect. behavior is normal. Judgment and thought content normal.   PHQ2/9:    08/28/2022    9:29 AM 05/21/2022    1:40 PM 04/27/2022    8:45 AM 12/29/2021    8:28 AM 08/29/2021    8:52 AM  Depression screen PHQ 2/9  Decreased Interest 0 0 0 0 0  Down, Depressed, Hopeless 0 0 0 0 0  PHQ - 2 Score 0 0 0 0 0  Altered sleeping 0 1 0 0 0  Tired, decreased energy 0 0 0 0 0  Change in appetite 0 0 0 0 0  Feeling bad or failure about yourself  0 0 0 0 0  Trouble concentrating 0 0 0 0 0  Moving slowly or fidgety/restless 0 0 0 0 0  Suicidal thoughts 0 0 0 0 0  PHQ-9 Score 0 1 0 0 0  Difficult doing work/chores     Not difficult at all    phq 9 is negative   Fall Risk:    08/28/2022    9:28 AM 05/21/2022    1:49 PM 04/27/2022    8:44 AM 12/29/2021    8:27 AM 08/29/2021    8:52 AM  Fall Risk   Falls in the past year? 0 0 0 0 0  Number falls in past yr:   0 0 0  Injury with Fall?   0 0 0  Risk for fall due to : No Fall Risks No Fall Risks No Fall Risks No Fall Risks   Follow up Falls prevention discussed;Education provided;Falls evaluation completed Falls prevention discussed;Education provided;Falls evaluation completed Falls prevention discussed Falls prevention discussed       Functional Status Survey: Is the patient deaf or have difficulty hearing?: No Does the patient have difficulty seeing, even when  wearing glasses/contacts?: No Does the patient have difficulty concentrating, remembering, or making decisions?: No Does the patient have difficulty walking or climbing stairs?: No Does the patient have difficulty dressing or bathing?: No Does the patient have difficulty doing errands alone such as visiting a doctor's office or shopping?: No    Assessment & Plan  1. Aortic atherosclerosis (HCC)  On zetia, cannot tolerate statin therapy   2. Benign hypertension with chronic kidney disease, stage III (HCC)  At goal   3. Stage 3a chronic kidney disease (HCC)  We will recheck labs next visit   4. Atherosclerosis of native coronary artery of native heart with stable angina pectoris (HCC)  Continue follow up with cardiologist, unable to tolerate statin therapy  5. DDD (degenerative disc disease), lumbosacral  - baclofen (LIORESAL) 10 MG tablet; Take 1 tablet (10 mg total) by mouth at bedtime as needed for muscle spasms.  Dispense: 90 tablet; Refill: 1  6. Muscle spasm  - baclofen (LIORESAL) 10 MG tablet; Take 1 tablet (10 mg total) by mouth at bedtime as needed for muscle spasms.  Dispense: 90 tablet; Refill: 1  7. Vitamin B12 deficiency  Continue supplementation   8. Anemia of chronic disease  Recheck it yearly   9. Gastroesophageal reflux disease without esophagitis  Taking prn medication   10. Dyslipidemia   11. Obstructive sleep apnea on CPAP  12. Mild cognitive impairment   13. Vitamin D deficiency    14. Statin myopathy

## 2022-08-28 ENCOUNTER — Encounter: Payer: Self-pay | Admitting: Family Medicine

## 2022-08-28 ENCOUNTER — Ambulatory Visit: Payer: Medicare PPO | Admitting: Family Medicine

## 2022-08-28 VITALS — BP 112/70 | HR 83 | Temp 97.8°F | Resp 14 | Ht 63.0 in | Wt 183.4 lb

## 2022-08-28 DIAGNOSIS — N1831 Chronic kidney disease, stage 3a: Secondary | ICD-10-CM

## 2022-08-28 DIAGNOSIS — E538 Deficiency of other specified B group vitamins: Secondary | ICD-10-CM

## 2022-08-28 DIAGNOSIS — I25118 Atherosclerotic heart disease of native coronary artery with other forms of angina pectoris: Secondary | ICD-10-CM | POA: Diagnosis not present

## 2022-08-28 DIAGNOSIS — M62838 Other muscle spasm: Secondary | ICD-10-CM | POA: Diagnosis not present

## 2022-08-28 DIAGNOSIS — E785 Hyperlipidemia, unspecified: Secondary | ICD-10-CM

## 2022-08-28 DIAGNOSIS — T466X5A Adverse effect of antihyperlipidemic and antiarteriosclerotic drugs, initial encounter: Secondary | ICD-10-CM

## 2022-08-28 DIAGNOSIS — N183 Hypertensive chronic kidney disease with stage 1 through stage 4 chronic kidney disease, or unspecified chronic kidney disease: Secondary | ICD-10-CM

## 2022-08-28 DIAGNOSIS — I129 Hypertensive chronic kidney disease with stage 1 through stage 4 chronic kidney disease, or unspecified chronic kidney disease: Secondary | ICD-10-CM

## 2022-08-28 DIAGNOSIS — E559 Vitamin D deficiency, unspecified: Secondary | ICD-10-CM

## 2022-08-28 DIAGNOSIS — K219 Gastro-esophageal reflux disease without esophagitis: Secondary | ICD-10-CM

## 2022-08-28 DIAGNOSIS — I700 Atherosclerosis of aorta: Secondary | ICD-10-CM | POA: Diagnosis not present

## 2022-08-28 DIAGNOSIS — M5137 Other intervertebral disc degeneration, lumbosacral region: Secondary | ICD-10-CM

## 2022-08-28 DIAGNOSIS — G4733 Obstructive sleep apnea (adult) (pediatric): Secondary | ICD-10-CM

## 2022-08-28 DIAGNOSIS — G720 Drug-induced myopathy: Secondary | ICD-10-CM

## 2022-08-28 DIAGNOSIS — G3184 Mild cognitive impairment, so stated: Secondary | ICD-10-CM

## 2022-08-28 DIAGNOSIS — D638 Anemia in other chronic diseases classified elsewhere: Secondary | ICD-10-CM

## 2022-08-28 MED ORDER — BACLOFEN 10 MG PO TABS: 10.0000 mg | ORAL_TABLET | Freq: Every evening | ORAL | 1 refills | Status: DC | PRN

## 2022-09-06 ENCOUNTER — Other Ambulatory Visit: Payer: Self-pay | Admitting: Cardiovascular Disease

## 2022-09-06 DIAGNOSIS — I10 Essential (primary) hypertension: Secondary | ICD-10-CM

## 2022-09-08 ENCOUNTER — Telehealth: Payer: Self-pay | Admitting: Cardiovascular Disease

## 2022-09-08 DIAGNOSIS — E782 Mixed hyperlipidemia: Secondary | ICD-10-CM

## 2022-09-08 DIAGNOSIS — Z789 Other specified health status: Secondary | ICD-10-CM

## 2022-09-08 MED ORDER — EZETIMIBE 10 MG PO TABS: 10.0000 mg | ORAL_TABLET | Freq: Every day | ORAL | 0 refills | Status: DC

## 2022-09-08 NOTE — Telephone Encounter (Signed)
*  STAT* If patient is at the pharmacy, call can be transferred to refill team.   1. Which medications need to be refilled? (please list name of each medication and dose if known)   ALPRAZolam (XANAX) 0.5 MG tablet   2. Which pharmacy/location (including street and city if local pharmacy) is medication to be sent to?  [place], [place]   3. Do they need a 30 day or 90 day supply? 90 days  Patient stated she has some of this medication which will run out the first week in December.  Patient has an appointment scheduled on 1/3.

## 2022-09-08 NOTE — Telephone Encounter (Signed)
*  STAT* If patient is at the pharmacy, call can be transferred to refill team.   1. Which medications need to be refilled? (please list name of each medication and dose if known)   ezetimibe (ZETIA) 10 MG tablet   2. Which pharmacy/location (including street and city if local pharmacy) is medication to be sent to?  [facility] [id number] - [address], [place]    3. Do they need a 30 day or 90 day supply? 90 day  Patient stated she has some of this medication which will run out the first week in December.  Patient has an appointment scheduled on 1/3.

## 2022-09-08 NOTE — Telephone Encounter (Signed)
Called patient to inform her that the prescription for Ezetimibe was sent to [facility]. I also informed patient that her request for a refill for Alprazolam should be requested to her primary care provider. Patient voiced understanding.

## 2022-10-02 ENCOUNTER — Other Ambulatory Visit: Payer: Self-pay | Admitting: Cardiovascular Disease

## 2022-10-02 DIAGNOSIS — I10 Essential (primary) hypertension: Secondary | ICD-10-CM

## 2022-10-05 ENCOUNTER — Telehealth: Payer: Self-pay | Admitting: Cardiovascular Disease

## 2022-10-05 DIAGNOSIS — I10 Essential (primary) hypertension: Secondary | ICD-10-CM

## 2022-10-05 MED ORDER — OLMESARTAN-AMLODIPINE-HCTZ 40-10-12.5 MG PO TABS: ORAL_TABLET | ORAL | 0 refills | Status: DC

## 2022-10-05 NOTE — Telephone Encounter (Signed)
*  STAT* If patient is at the pharmacy, call can be transferred to refill team.   1. Which medications need to be refilled? (please list name of each medication and dose if known) Olmesartan-amLODIPine-HCTZ 40-10-12.5 MG TABS   2. Which pharmacy/location (including street and city if local pharmacy) is medication to be sent to? [id number] - [place], [place] - [address] [address]   3. Do they need a 30 day or 90 day supply? 90

## 2022-10-19 ENCOUNTER — Other Ambulatory Visit: Payer: Self-pay | Admitting: Family Medicine

## 2022-10-21 DIAGNOSIS — Z1231 Encounter for screening mammogram for malignant neoplasm of breast: Secondary | ICD-10-CM | POA: Diagnosis not present

## 2022-10-21 LAB — HM MAMMOGRAPHY

## 2022-11-03 NOTE — Progress Notes (Signed)
Evaluation Performed:  Follow-up visit  Date:  11/04/2022   ID:  Jodi Kirby, Jodi Kirby [birth year], MRN [id number]  Patient Location:  [facility] [id number]   Provider location:   [facility], [place] office  PCP:  Steele Sizer, MD  Cardiologist:  Patsy Baltimore  Chief Complaint  Patient presents with   12 month follow up     Patient c/o chest heaviness at times. Medications reviewed by the patient verbally.      History of Present Illness:    Jodi Kirby is a 77 y.o. female past medical history of coronary  arterial disease, catheterization in 2003 with 20% disease in her RCA and LAD,  hyperlipidemia, back surgery,  obstructive sleep apnea on CPAP,  Total knee replacement Chronic chest pressure.  She presents for routine followup of her CAD, hyperlipidemia  LOV December 2022 In general reports that she is doing well Having severe hand cramps on zetia Does not have hand cramps if she does not take the Zetia  Denies chest pain or shortness of breath on exertion No regular exercise program Used to walk on a regular basis  CT scan abdomen images pulled up in the office showing mild aortic atherosclerosis  Prior orthopedic surgery due to spinal stenosis and back pain , surgery, 12/2020  Cardiac risk factors Non smoker A1C 5.6 Total chol 178 LDL 104  Continues on triple combination blood pressure pill Reports statin intolerance Intolerant of Crestor and other statins which caused cramping  EKG personally reviewed by myself on todays visit Normal sinus rhythm rate 59 bpm no significant ST-T wave changes, PACs  Other past medical history reviewed admitted to the hospital August 2017 for chest pain Cardiac enzymes negative,  She had outpatient Exercise stress Myoview test 07/29/2016 showing no ischemia, Ejection fraction 62%   Stress test in 2014 And August 2017 showing no ischemia   Past Medical History:   Diagnosis Date   Allergic rhinitis, cause unspecified    Allergy    Anxiety    Chronic kidney disease    Coronary artery disease    DDD (degenerative disc disease), lumbar    Dysmetabolic syndrome X    Dysuria    Esophageal reflux    Essential hypertension, benign    HNP (herniated nucleus pulposus), lumbar    Dr. Sharlet [facility])   Hyperlipidemia    Lumbago    Lumbar radiculitis    Dr. [facility]let [facility])   Lump or mass in breast    LEFT BREAST   Myalgia and myositis, unspecified    Myocardial infarction ([place])    Obstructive sleep apnea    CPAP   Osteoarthrosis, unspecified whether generalized or localized, lower leg    Other abnormal glucose    Other and unspecified angina pectoris    Personal history of malignant neoplasm of other endocrine glands and related structures    Toxic effect of venom(989.5)    Unspecified iridocyclitis    Unspecified vitamin D deficiency    Vertigo 2018   1 episode   Past Surgical History:  Procedure Laterality Date   ABDOMINAL HYSTERECTOMY  1970   ANKLE SURGERY Right 1980   ANTERIOR LATERAL LUMBAR FUSION WITH PERCUTANEOUS SCREW 2 LEVEL N/A 12/16/2020   Procedure: L2/3, L3/4 LATERAL LUMBAR FUSION, L2-4 PEDICLE SCREW FIXATION;  Surgeon: Deetta Perla, MD;  Location: [facility] ORS;  Service: Neurosurgery;  Laterality: N/A;   BLADDER SURGERY  2012   CARDIAC  CATHETERIZATION  2003   [place]   CARDIAC CATHETERIZATION     [place]   CATARACT EXTRACTION W/PHACO Right 02/09/2018   Procedure: CATARACT EXTRACTION PHACO AND INTRAOCULAR LENS PLACEMENT ([place]) RIGHT;  Surgeon: Leandrew Koyanagi, MD;  Location: [facility];  Service: Ophthalmology;  Laterality: Right;  sleep apnea   CATARACT EXTRACTION W/PHACO Left 03/09/2018   Procedure: CATARACT EXTRACTION PHACO AND INTRAOCULAR LENS PLACEMENT ([facility]);  Surgeon: Leandrew Koyanagi, MD;  Location: [facility];  Service: Ophthalmology;  Laterality: Left;  IVA TOPICALLEFT   COLONOSCOPY  2008, 2015   Dr.  Allen Norris   COLONOSCOPY N/A 03/05/2022   Procedure: COLONOSCOPY;  Surgeon: Annamaria Helling, DO;  Location: [facility] ENDOSCOPY;  Service: Gastroenterology;  Laterality: N/A;   CYSTOSTOMY     ESOPHAGOGASTRODUODENOSCOPY N/A 03/05/2022   Procedure: ESOPHAGOGASTRODUODENOSCOPY (EGD);  Surgeon: Annamaria Helling, DO;  Location: [facility] ENDOSCOPY;  Service: Gastroenterology;  Laterality: N/A;   KNEE SURGERY  08/18/2011   arthroscopic right   LUMBAR FUSION     L4-5, S-1   NECK SURGERY  2003   TOTAL KNEE ARTHROPLASTY Right 04/29/2015   Procedure: TOTAL KNEE ARTHROPLASTY;  Surgeon: Leanor Kail, MD;  Location: [facility] ORS;  Service: Orthopedics;  Laterality: Right;   TUBAL LIGATION       Allergies:   Niaspan [niacin er], Oxycodone-acetaminophen, and Statins   Social History   Tobacco Use   Smoking status: Never   Smokeless tobacco: Never   Tobacco comments:    smoking cessation materials not required  Vaping Use   Vaping Use: Never used  Substance Use Topics   Alcohol use: No    Alcohol/week: 0.0 standard drinks of alcohol   Drug use: No     Current Outpatient Medications on File Prior to Visit  Medication Sig Dispense Refill   acetaminophen (TYLENOL) 650 MG CR tablet Take 650 mg by mouth daily as needed for pain.     ALPRAZolam (XANAX) 0.5 MG tablet Take 0.5 mg by mouth at bedtime as needed for anxiety.     Ascorbic Acid (VITAMIN C WITH ROSE HIPS) 1000 MG tablet Take 1,000 mg by mouth daily. With Zinc     aspirin EC 81 MG tablet Take 81 mg by mouth daily. Swallow whole.     baclofen (LIORESAL) 10 MG tablet Take 1 tablet (10 mg total) by mouth at bedtime as needed for muscle spasms. 90 tablet 1   Cholecalciferol (VITAMIN D3) 1000 UNITS CAPS Take 1,000 Units by mouth every morning.      denosumab (PROLIA) 60 MG/ML SOSY injection Inject 60 mg into the skin every 6 (six) months.     EPINEPHrine (EPI-PEN) 0.3 mg/0.3 mL DEVI Inject 0.3 mg into the muscle as needed (anaphylaxis).     ezetimibe  (ZETIA) 10 MG tablet Take 1 tablet (10 mg total) by mouth daily. 90 tablet 0   fluticasone (FLONASE) 50 MCG/ACT nasal spray USE 2 SPRAY(S) IN EACH NOSTRIL AT BEDTIME 48 g 0   Multiple Vitamins-Minerals (CENTRUM SILVER PO) Take 1 tablet by mouth every morning.      nitroGLYCERIN (NITROSTAT) 0.4 MG SL tablet Place 1 tablet (0.4 mg total) under the tongue every 5 (five) minutes as needed for chest pain. 25 tablet 0   Olmesartan-amLODIPine-HCTZ 40-10-12.5 MG TABS TAKE 1 TABLET BY MOUTH ONCE DAILY . APPOINTMENT REQUIRED FOR FUTURE REFILLS 90 tablet 0   omeprazole (PRILOSEC) 20 MG capsule Take 20 mg by mouth daily.     polyethylene glycol (MIRALAX / GLYCOLAX) 17  g packet Take 17 g by mouth daily. 14 each 0   No current facility-administered medications on file prior to visit.     Family Hx: The patient's family history includes CAD in her father; Healthy in her sister; Hypertension in her mother; Kidney disease in her mother and son. There is no history of Kidney cancer or Bladder Cancer.  ROS:   Please see the history of present illness.    Review of Systems  Constitutional: Negative.   HENT: Negative.    Respiratory: Negative.    Cardiovascular: Negative.   Gastrointestinal: Negative.   Musculoskeletal: Negative.   Neurological: Negative.   Psychiatric/Behavioral: Negative.    All other systems reviewed and are negative.    Labs/Other Tests and Data Reviewed:    Recent Labs: 04/29/2022: ALT 17; BUN 19; Creat 1.25; Hemoglobin 10.3; Platelets 230; Potassium 4.2; Sodium 141   Recent Lipid Panel Lab Results  Component Value Date/Time   CHOL 178 04/29/2022 08:45 AM   CHOL 211 (H) 11/08/2015 11:01 AM   TRIG 174 (H) 04/29/2022 08:45 AM   HDL 45 (L) 04/29/2022 08:45 AM   HDL 48 11/08/2015 11:01 AM   CHOLHDL 4.0 04/29/2022 08:45 AM   LDLCALC 104 (H) 04/29/2022 08:45 AM    Wt Readings from Last 3 Encounters:  11/04/22 182 lb 8 oz (82.8 kg)  08/28/22 183 lb 6.4 oz (83.2 kg)  05/21/22  185 lb 1.6 oz (84 kg)     Exam:    Vital Signs: Vital signs may also be detailed in the HPI BP 106/60 (BP Location: Left Arm, Patient Position: Sitting, Cuff Size: Normal)   Pulse (!) 59   Ht 5\' 3"  (1.6 m)   Wt 182 lb 8 oz (82.8 kg)   SpO2 97%   BMI 32.33 kg/m   Constitutional:  oriented to person, place, and time. No distress.  HENT:  Head: Grossly normal Eyes:  no discharge. No scleral icterus.  Neck: No JVD, no carotid bruits  Cardiovascular: Regular rate and rhythm, no murmurs appreciated Pulmonary/Chest: Clear to auscultation bilaterally, no wheezes or rails Abdominal: Soft.  no distension.  no tenderness.  Musculoskeletal: Normal range of motion Neurological:  normal muscle tone. Coordination normal. No atrophy Skin: Skin warm and dry Psychiatric: normal affect, pleasant  ASSESSMENT & PLAN:    Problem List Items Addressed This Visit       Cardiology Problems   Benign hypertension with chronic kidney disease, stage III (HCC)   Atherosclerosis of native coronary artery of native heart with stable angina pectoris (HCC) - Primary   Aortic atherosclerosis (HCC)     Other   Stage 3a chronic kidney disease (HCC)   Other Visit Diagnoses     Statin intolerance       Mixed hyperlipidemia       Essential hypertension         Essential hypertension Blood pressure is well controlled on today's visit. No changes made to the medications.  Adjustment disorder Previous issues with sick husband,  Less stress at home as he has recovered  Aortic atherosclerosis Unable to tolerate Zetia or statins which cause hand cramping or muscle spasm Prefers not to be on injection medication, PCSK9 inhibitor She would prefer to see how repeat lipid panel goes on routine visit with primary care Aortic atherosclerosis is mild on CT scan images  Hyperlipidemia Will stop zetia No further therapy at this time, lifestyle modification recommended Recommend she receive her walking program,  low carbohydrate diet  Total encounter time more than 30 minutes  Greater than 50% was spent in counseling and coordination of care with the patient    Signed, Julien Nordmann, MD  [facility] [address], [place], [place] [address]

## 2022-11-04 ENCOUNTER — Encounter: Payer: Self-pay | Admitting: Cardiovascular Disease

## 2022-11-04 ENCOUNTER — Ambulatory Visit: Payer: Medicare PPO | Attending: Cardiovascular Disease | Admitting: Cardiovascular Disease

## 2022-11-04 VITALS — BP 106/60 | HR 59 | Ht 63.0 in | Wt 182.5 lb

## 2022-11-04 DIAGNOSIS — N1831 Chronic kidney disease, stage 3a: Secondary | ICD-10-CM

## 2022-11-04 DIAGNOSIS — Z789 Other specified health status: Secondary | ICD-10-CM

## 2022-11-04 DIAGNOSIS — I700 Atherosclerosis of aorta: Secondary | ICD-10-CM | POA: Diagnosis not present

## 2022-11-04 DIAGNOSIS — I25118 Atherosclerotic heart disease of native coronary artery with other forms of angina pectoris: Secondary | ICD-10-CM | POA: Diagnosis not present

## 2022-11-04 DIAGNOSIS — E782 Mixed hyperlipidemia: Secondary | ICD-10-CM

## 2022-11-04 DIAGNOSIS — N183 Chronic kidney disease, stage 3 unspecified: Secondary | ICD-10-CM

## 2022-11-04 DIAGNOSIS — I10 Essential (primary) hypertension: Secondary | ICD-10-CM

## 2022-11-04 DIAGNOSIS — I129 Hypertensive chronic kidney disease with stage 1 through stage 4 chronic kidney disease, or unspecified chronic kidney disease: Secondary | ICD-10-CM

## 2022-11-04 NOTE — Patient Instructions (Addendum)
Medication Instructions:  Stop zetia  If you need a refill on your cardiac medications before your next appointment, please call your pharmacy.   Lab work: No new labs needed  Testing/Procedures: No new testing needed  Follow-Up: At [facility], you and your health needs are our priority.  As part of our continuing mission to provide you with exceptional heart care, we have created designated Provider Care Teams.  These Care Teams include your primary Cardiologist (physician) and Advanced Practice Providers (APPs -  Physician Assistants and Nurse Practitioners) who all work together to provide you with the care you need, when you need it.  You will need a follow up appointment in 12 months  Providers on your designated Care Team:   Murray Hodgkins, NP Christell Faith, PA-C Cadence Kathlen Mody, [place]  COVID-19 Vaccine Information can be found at: [email or web address] For questions related to vaccine distribution or appointments, please email [email or web address] or call [phone].

## 2022-11-11 DIAGNOSIS — G4733 Obstructive sleep apnea (adult) (pediatric): Secondary | ICD-10-CM | POA: Diagnosis not present

## 2022-11-22 DIAGNOSIS — G4733 Obstructive sleep apnea (adult) (pediatric): Secondary | ICD-10-CM | POA: Diagnosis not present

## 2022-11-28 ENCOUNTER — Other Ambulatory Visit: Payer: Self-pay | Admitting: Family Medicine

## 2022-12-02 ENCOUNTER — Other Ambulatory Visit: Payer: Self-pay | Admitting: Family Medicine

## 2022-12-02 DIAGNOSIS — J302 Other seasonal allergic rhinitis: Secondary | ICD-10-CM

## 2022-12-12 DIAGNOSIS — G4733 Obstructive sleep apnea (adult) (pediatric): Secondary | ICD-10-CM | POA: Diagnosis not present

## 2022-12-21 DIAGNOSIS — G4733 Obstructive sleep apnea (adult) (pediatric): Secondary | ICD-10-CM | POA: Diagnosis not present

## 2022-12-27 ENCOUNTER — Other Ambulatory Visit: Payer: Self-pay

## 2022-12-27 ENCOUNTER — Emergency Department
Admission: EM | Admit: 2022-12-27 | Discharge: 2022-12-27 | Disposition: A | Discharge status: Home / Self Care | Payer: Medicare PPO | Attending: Emergency Medicine | Admitting: Emergency Medicine

## 2022-12-27 ENCOUNTER — Emergency Department: Payer: Medicare PPO

## 2022-12-27 ENCOUNTER — Encounter: Payer: Self-pay | Admitting: Emergency Medicine

## 2022-12-27 DIAGNOSIS — I700 Atherosclerosis of aorta: Secondary | ICD-10-CM | POA: Diagnosis not present

## 2022-12-27 DIAGNOSIS — I10 Essential (primary) hypertension: Secondary | ICD-10-CM | POA: Diagnosis not present

## 2022-12-27 DIAGNOSIS — R109 Unspecified abdominal pain: Secondary | ICD-10-CM

## 2022-12-27 DIAGNOSIS — R1032 Left lower quadrant pain: Secondary | ICD-10-CM | POA: Diagnosis not present

## 2022-12-27 DIAGNOSIS — M545 Low back pain, unspecified: Secondary | ICD-10-CM | POA: Diagnosis not present

## 2022-12-27 LAB — COMPREHENSIVE METABOLIC PANEL
ALT: 22 U/L (ref 0–44)
AST: 21 U/L (ref 15–41)
Albumin: 4.3 g/dL (ref 3.5–5.0)
Alkaline Phosphatase: 70 U/L (ref 38–126)
Anion gap: 10 (ref 5–15)
BUN: 23 mg/dL (ref 8–23)
CO2: 26 mmol/L (ref 22–32)
Calcium: 9.6 mg/dL (ref 8.9–10.3)
Chloride: 104 mmol/L (ref 98–111)
Creatinine, Ser: 1.41 mg/dL — ABNORMAL HIGH (ref 0.44–1.00)
GFR, Estimated: 38 mL/min — ABNORMAL LOW (ref >60)
Glucose, Bld: 129 mg/dL — ABNORMAL HIGH (ref 70–99)
Potassium: 3.9 mmol/L (ref 3.5–5.1)
Sodium: 140 mmol/L (ref 135–145)
Total Bilirubin: 0.6 mg/dL (ref 0.3–1.2)
Total Protein: 7.3 g/dL (ref 6.5–8.1)

## 2022-12-27 LAB — URINALYSIS, ROUTINE W REFLEX MICROSCOPIC
Bilirubin Urine: NEGATIVE
Glucose, UA: NEGATIVE mg/dL
Hgb urine dipstick: NEGATIVE
Ketones, ur: NEGATIVE mg/dL
Nitrite: NEGATIVE
Protein, ur: NEGATIVE mg/dL
Specific Gravity, Urine: 1.011 (ref 1.005–1.030)
pH: 6 (ref 5.0–8.0)

## 2022-12-27 LAB — CBC
HCT: 32.3 % — ABNORMAL LOW (ref 36.0–46.0)
Hemoglobin: 10.8 g/dL — ABNORMAL LOW (ref 12.0–15.0)
MCH: 28 pg (ref 26.0–34.0)
MCHC: 33.4 g/dL (ref 30.0–36.0)
MCV: 83.7 fL (ref 80.0–100.0)
Platelets: 286 10*3/uL (ref 150–400)
RBC: 3.86 MIL/uL — ABNORMAL LOW (ref 3.87–5.11)
RDW: 12.9 % (ref 11.5–15.5)
WBC: 9.2 10*3/uL (ref 4.0–10.5)
nRBC: 0 % (ref 0.0–0.2)

## 2022-12-27 LAB — LIPASE, BLOOD: Lipase: 44 U/L (ref 11–51)

## 2022-12-27 LAB — TROPONIN I (HIGH SENSITIVITY): Troponin I (High Sensitivity): 8 ng/L (ref <18)

## 2022-12-27 MED ORDER — IOHEXOL 300 MG/ML  SOLN
80.0000 mL | Freq: Once | INTRAMUSCULAR | Status: AC | PRN
  Administered 2022-12-27: 80 mL via INTRAVENOUS

## 2022-12-27 MED ORDER — LIDOCAINE 5 % EX PTCH
1.0000 | MEDICATED_PATCH | Freq: Once | CUTANEOUS | Status: DC
  Administered 2022-12-27: 1 via TRANSDERMAL
  Filled 2022-12-27: qty 1

## 2022-12-27 MED ORDER — SODIUM CHLORIDE 0.9 % IV BOLUS
500.0000 mL | Freq: Once | INTRAVENOUS | Status: AC
  Administered 2022-12-27: 500 mL via INTRAVENOUS

## 2022-12-27 MED ORDER — TRAMADOL HCL 50 MG PO TABS
50.0000 mg | ORAL_TABLET | Freq: Once | ORAL | Status: DC
  Filled 2022-12-27: qty 1

## 2022-12-27 MED ORDER — ACETAMINOPHEN 500 MG PO TABS
1000.0000 mg | ORAL_TABLET | Freq: Once | ORAL | Status: AC
  Administered 2022-12-27: 1000 mg via ORAL
  Filled 2022-12-27: qty 2

## 2022-12-27 MED ORDER — LIDOCAINE 5 % EX PTCH: 1.0000 | MEDICATED_PATCH | Freq: Two times a day (BID) | CUTANEOUS | 0 refills | Status: AC

## 2022-12-27 NOTE — ED Provider Notes (Signed)
[facility] Provider Note    Event Date/Time   First MD Initiated Contact with Patient 12/27/22 2006     (approximate)   History   Abdominal Pain   HPI  Jodi Kirby is a 77 y.o. female who comes in with left flank pain to her lower back for the past 2 days no urinary symptoms no nausea vomiting diarrhea.  She denies any L-spine tenderness.  Denies any numbness, tingling.  Denies any weakness in her legs.  She does report occasionally having a burning sensation over the area or cold sensation over the area.   Physical Exam   Triage Vital Signs: ED Triage Vitals  Enc Vitals Group     BP 12/27/22 1909 (!) 158/68     Pulse Rate 12/27/22 1909 73     Resp 12/27/22 1909 18     Temp 12/27/22 1909 98.6 F (37 C)     Temp Source 12/27/22 1909 Oral     SpO2 12/27/22 1909 100 %     Weight 12/27/22 1909 179 lb (81.2 kg)     Height 12/27/22 1909 '5\' 3"'$  (1.6 m)     Head Circumference --      Peak Flow --      Pain Score 12/27/22 1916 9     Pain Loc --      Pain Edu? --      Excl. in [place]? --     Most recent vital signs: Vitals:   12/27/22 1909  BP: (!) 158/68  Pulse: 73  Resp: 18  Temp: 98.6 F (37 C)  SpO2: 100%     General: Awake, no distress.  CV:  Good peripheral perfusion.  Resp:  Normal effort.  Abd:  No distention.  Other:  Patient has no rash noted.  No CTL spine tenderness.  She has got low left flank tenderness radiating to the left lower quadrant.  Pain is worse with movement.  Able to lift both legs off the bed.  Sensation intact in the legs.  No numbness or tingling.  Good distal pulses in bilateral feet.  Patient is able to stand up.   ED Results / Procedures / Treatments   Labs (all labs ordered are listed, but only abnormal results are displayed) Labs Reviewed  COMPREHENSIVE METABOLIC PANEL - Abnormal; Notable for the following components:      Result Value   Glucose, Bld 129 (*)    Creatinine, Ser 1.41 (*)    GFR,  Estimated 38 (*)    All other components within normal limits  CBC - Abnormal; Notable for the following components:   RBC 3.86 (*)    Hemoglobin 10.8 (*)    HCT 32.3 (*)    All other components within normal limits  URINALYSIS, ROUTINE W REFLEX MICROSCOPIC - Abnormal; Notable for the following components:   Color, Urine STRAW (*)    APPearance CLEAR (*)    Leukocytes,Ua SMALL (*)    Bacteria, UA RARE (*)    All other components within normal limits  LIPASE, BLOOD     EKG  My interpretation of EKG:  Normal sinus rate of 85, no st elevation, no twi, normal intervals.  RADIOLOGY I have reviewed the CT personally and interpretted and no kidney stone.    PROCEDURES:  Critical Care performed: No  Procedures   MEDICATIONS ORDERED IN ED: Medications  lidocaine (LIDODERM) 5 % 1 patch (1 patch Transdermal Patch Applied 12/27/22 2044)  traMADol (ULTRAM) tablet  50 mg (50 mg Oral Patient Refused/Not Given 12/27/22 2044)  sodium chloride 0.9 % bolus 500 mL (0 mLs Intravenous Stopped 12/27/22 2202)  acetaminophen (TYLENOL) tablet 1,000 mg (1,000 mg Oral Given 12/27/22 2044)  iohexol (OMNIPAQUE) 300 MG/ML solution 80 mL (80 mLs Intravenous Contrast Given 12/27/22 2105)     IMPRESSION / MDM / ASSESSMENT AND PLAN / ED COURSE  I reviewed the triage vital signs and the nursing notes.   Patient's presentation is most consistent with acute presentation with potential threat to life or bodily function.   Differential is diverticulitis, kidney stone, UTI, obstruction.  Patient given lidocaine patch, Tylenol.  Will get CT scan to further evaluate.  Troponin is negative.  Lipase normal CMP shows slightly elevated creatinine patient getting some fluids.  CBC shows stable hemoglobin white count is normal.  Urine overall reassuring  IMPRESSION: 1. No acute intra-abdominal pathology identified. No definite radiographic explanation for the patient's reported symptoms. 2. Severe sigmoid  diverticulosis without superimposed acute inflammatory change. 3. Aortic atherosclerosis.  Discussed with patient her results.  Her pain seems to be more with movement around.  No midline pain or tenderness or signs of cord compression.  We discussed keeping an eye out that there is not any development of any rash.  At this time I do not see any.  We discussed using Tylenol, lidocaine patch to help with pain and following up with her spine doctor or in her primary care doctor.  She expressed understanding and felt comfortable with discharge home.    FINAL CLINICAL IMPRESSION(S) / ED DIAGNOSES   Final diagnoses:  Left flank pain     Rx / DC Orders   ED Discharge Orders          Ordered    lidocaine (LIDODERM) 5 %  Every 12 hours        12/27/22 2215             Note:  This document was prepared using Dragon voice recognition software and may include unintentional dictation errors.   Vanessa Alamo, MD 12/27/22 2215

## 2022-12-27 NOTE — ED Notes (Signed)
Pt provided with a urine cup and explained the need for a urine sample.

## 2022-12-27 NOTE — Discharge Instructions (Addendum)
Use lidocaine patches, take Tylenol 1 g every 8 hours to help with pain.  Use heating packs or cool packs to see if that helps with your pain.  Return to the ER if you develop fevers, rash or any other concerns but at this time your CT imaging was reassuring  Your kidney function is slightly elevated to try to avoid a lot of ibuprofen use and you could follow this up with your primary care doctor and have it rechecked.  Drink plenty of fluid  IMPRESSION: 1. No acute intra-abdominal pathology identified. No definite radiographic explanation for the patient's reported symptoms. 2. Severe sigmoid diverticulosis without superimposed acute inflammatory change. 3. Aortic atherosclerosis.

## 2022-12-27 NOTE — ED Notes (Signed)
Discharge instructions explained to patient and family at this time. Patient and family state they understand and agree.

## 2022-12-27 NOTE — ED Triage Notes (Signed)
Pt c/o lower abd pain and L flank pain that radiates to her lower back x 2 days. Pt states pain is a sharp sensation and a "cold sensation". Pt denies urinary symptoms at this time. Pt denies N/V/D at this time.

## 2022-12-27 NOTE — ED Notes (Signed)
Pt returned from CT °

## 2022-12-31 ENCOUNTER — Other Ambulatory Visit: Payer: Self-pay

## 2022-12-31 ENCOUNTER — Telehealth: Payer: Self-pay | Admitting: Cardiovascular Disease

## 2022-12-31 DIAGNOSIS — I10 Essential (primary) hypertension: Secondary | ICD-10-CM

## 2022-12-31 MED ORDER — OLMESARTAN-AMLODIPINE-HCTZ 40-10-12.5 MG PO TABS: 1.0000 | ORAL_TABLET | Freq: Every day | ORAL | 0 refills | Status: DC

## 2022-12-31 NOTE — Telephone Encounter (Signed)
*  STAT* If patient is at the pharmacy, call can be transferred to refill team.   1. Which medications need to be refilled? (please list name of each medication and dose if known) new prescrip;tions for Olmesartan  2. Which pharmacy/location (including street and city if local pharmacy) is medication to be sent to?[facility]  [place]  3. Do they need a 30 day or 90 day supply? 90 days and refills

## 2022-12-31 NOTE — Telephone Encounter (Signed)
Olmesartan-amLODIPine-HCTZ 40-10-12.5 MG TABS 90 tablet 0 12/31/2022    Sig - Route: Take 1 tablet by mouth daily. - Oral   Sent to pharmacy as: Olmesartan-amLODIPine-HCTZ 40-10-12.5 MG Tab   E-Prescribing Status: Sent to pharmacy (12/31/2022  3:26 PM EST)    Associated Diagnoses  Essential hypertension     Pharmacy  [facility], [place]

## 2023-01-01 ENCOUNTER — Ambulatory Visit: Payer: Medicare PPO | Admitting: Family Medicine

## 2023-01-01 ENCOUNTER — Telehealth: Payer: Self-pay

## 2023-01-01 IMAGING — CT CT L SPINE W/O CM
3 of 4 series · 12 of 33 positions shown, 14 images · non-contrast
Comparison: CT lumbar spine 11/05/2020.

CLINICAL DATA: Patient status post extension of prior L4-5 fusion
to the L2-3 and L3-4 levels yesterday. Back pain with movement.

EXAM:
CT LUMBAR SPINE WITHOUT CONTRAST
TECHNIQUE: Multidetector CT imaging of the lumbar spine was performed without
intravenous contrast administration. Multiplanar CT image
reconstructions were also generated.

[Series 4: l spine soft (person_name) · axial · 0.36mm/px · z∈[-303,-141]mm · 4 of 118 slices shown, 5 images]
[im 19/118  soft-tissue]
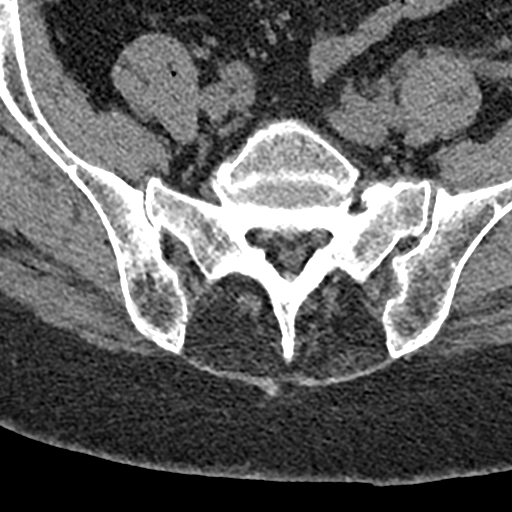
[im 19/118  bone]
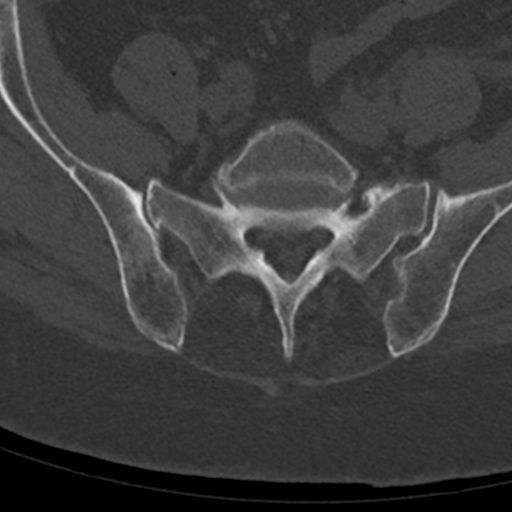
[im 46/118  bone]
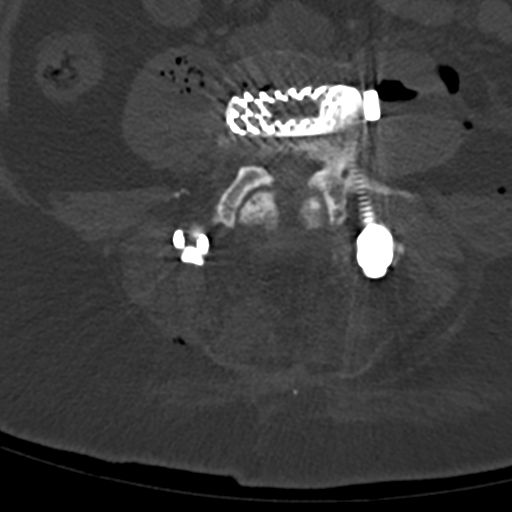
[im 73/118  bone]
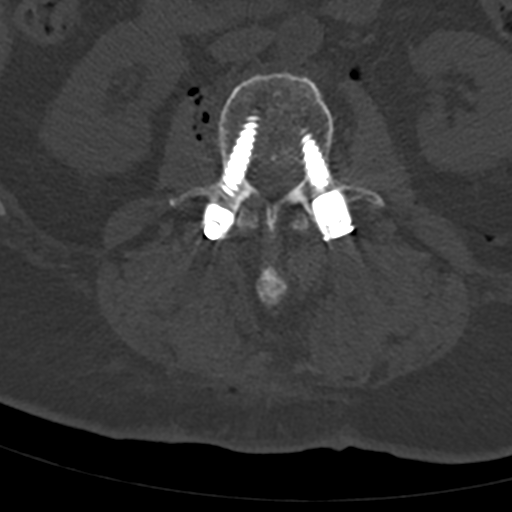
[im 100/118  bone]
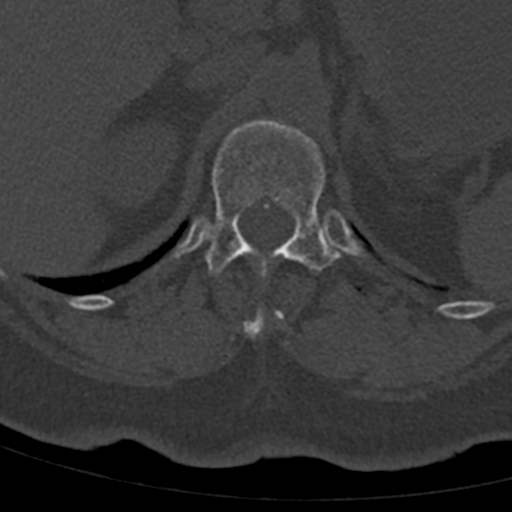

[Series 5: coronal bone · coronal · 0.34mm/px · 3 of 77 slices shown]
[im 20/77  bone]
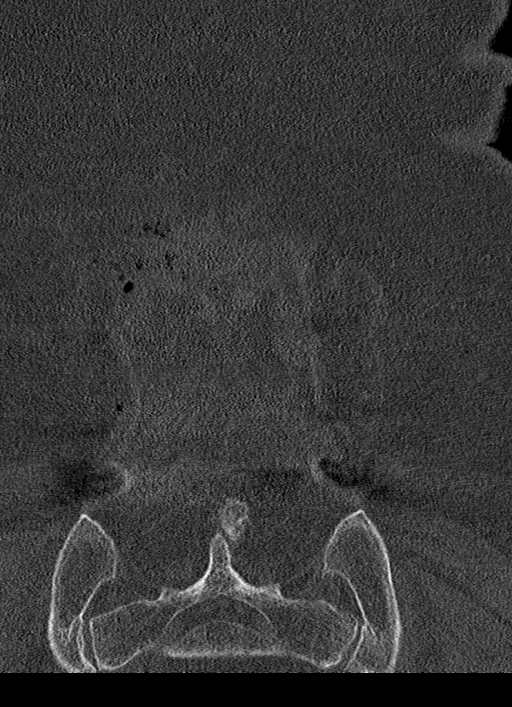
[im 39/77  bone]
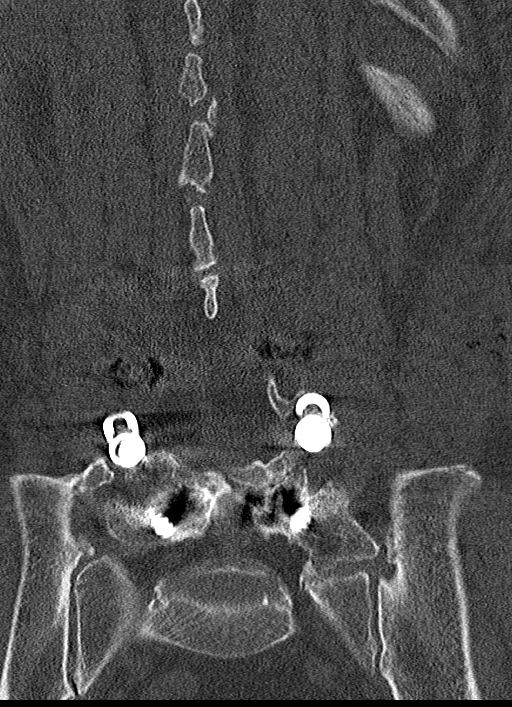
[im 58/77  bone]
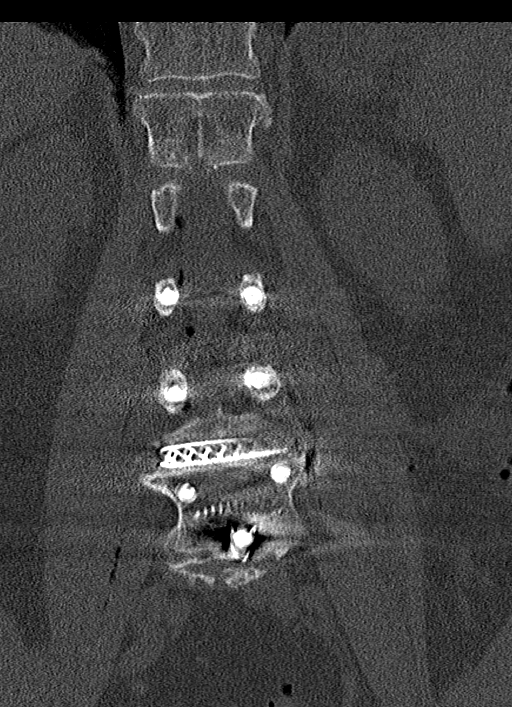

[Series 6: sagittal st · sagittal · 0.30mm/px · 5 of 89 slices shown, 6 images]
[im 30/89  bone]
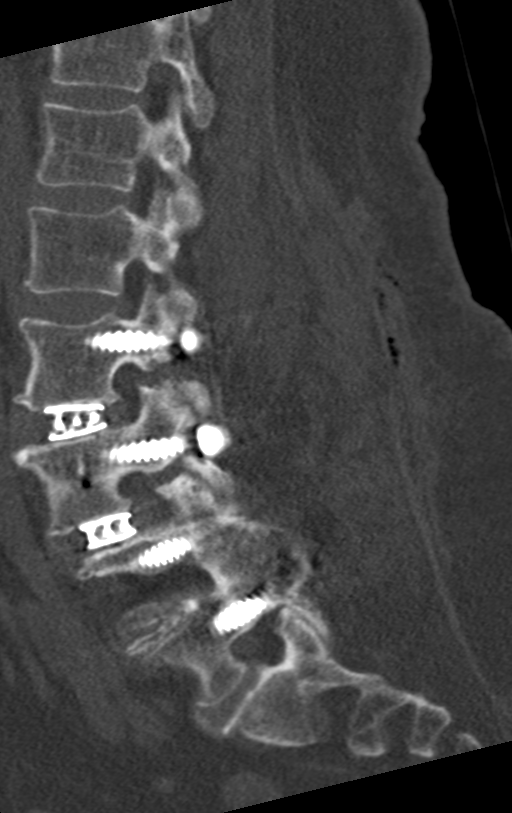
[im 37/89  bone]
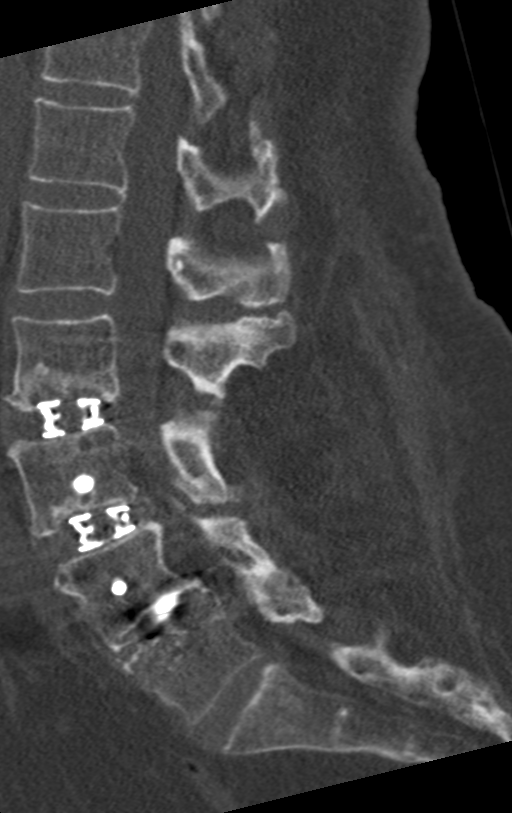
[im 45/89  soft-tissue]
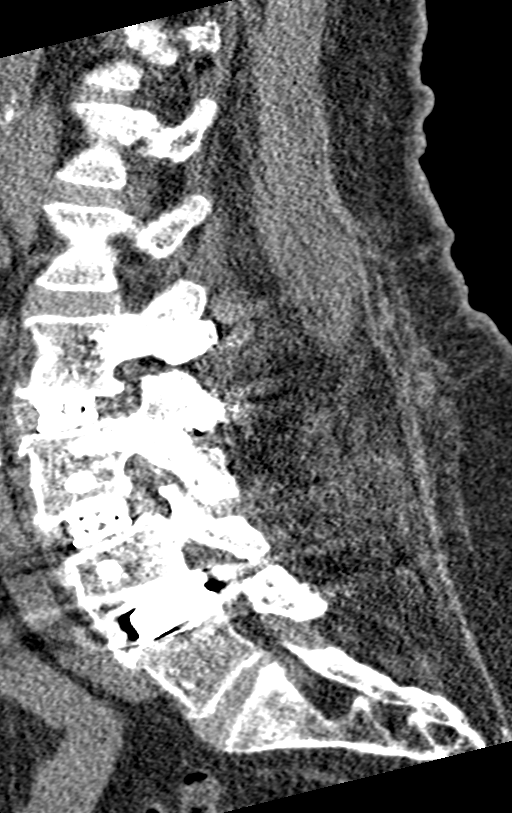
[im 45/89  bone]
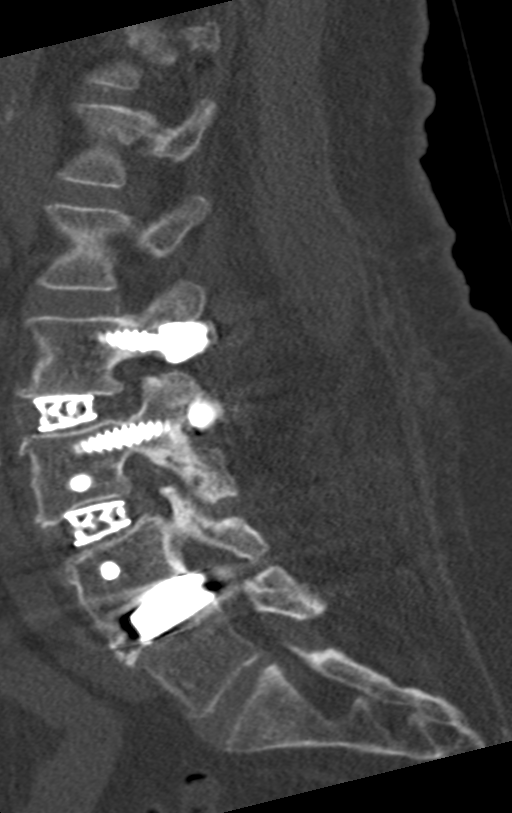
[im 52/89  bone]
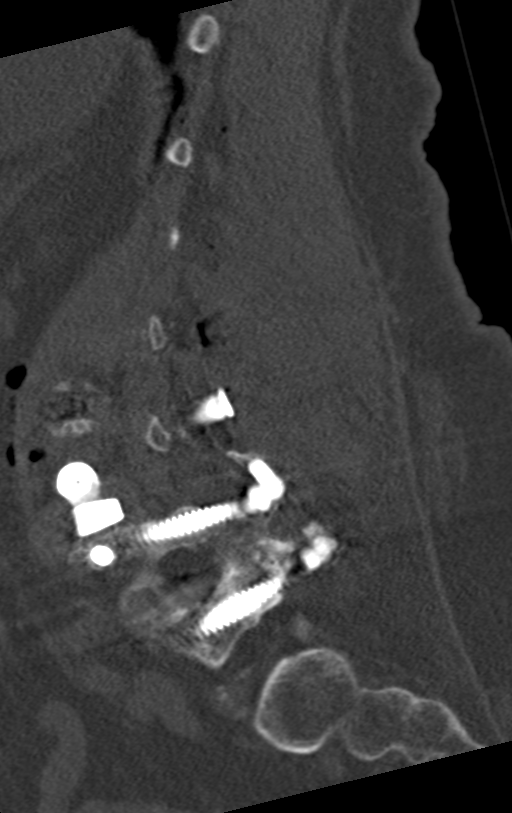
[im 59/89  bone]
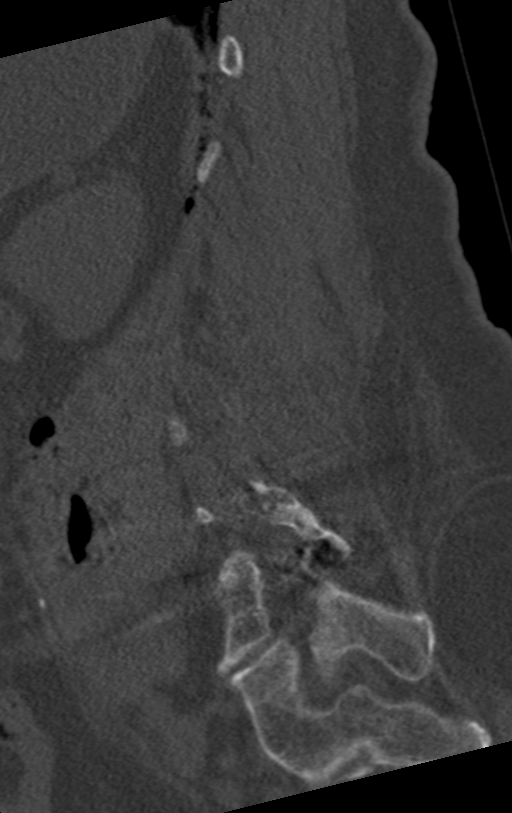

[12 of 33 positions shown; findings below may reference images not displayed]

FINDINGS: Segmentation: Standard.

Alignment: Trace retrolisthesis L2 on L3 is reduced from 0.4 cm on
the prior exam.

Vertebrae: No fracture or worrisome lesion. New pedicle screws,
stabilization bars and interbody spaces are in place at L2-3. There
are also new left lateral approach fusion plate, vertebral body
screws and interbody spacer at L3-4. Hardware is intact and appears
appropriately positioned. Solid L4-5 fusion again seen.

Paraspinal and other soft tissues: Gas in the psoas muscles
bilaterally is new since the prior examination and consistent with
surgery yesterday. No focal fluid collection is identified. Aortic
atherosclerosis is noted.

Disc levels: T11-12: Negative.

T12-L1: Minimal disc bulge.  No stenosis.

L1-2: Minimal disc bulge without stenosis.

L2-3: Status post discectomy since the prior exam. The central canal
and foramina appear open.

L3-4: Prior laminectomy defect again seen. There is facet
arthropathy. The patient has undergone discectomy and fusion since
the prior study. The central canal and foramina appear open.

L4-5: Solidly fused.  No stenosis is seen.

L5-S1: Negative.
IMPRESSION: Status post L2-4 fusion as described above without evidence of
complication. No acute abnormality is identified. Gas in the psoas
muscles bilaterally is most consistent with postoperative change.
The central canal and foramina appear open at the fusion levels.

Solid L4-5 fusion.  No stenosis.

Aortic Atherosclerosis ([id number]).

## 2023-01-01 NOTE — Telephone Encounter (Signed)
     Patient  visit on 2/26  at [place]    Have you been able to follow up with your primary care physician?  Yes  3/4  The patient was or was not able to obtain any needed medicine or equipment. Yes   Are there diet recommendations that you are having difficulty following? Na   Patient expresses understanding of discharge instructions and education provided has no other needs at this time.  Yes     [place] [phone] [address], [place], [place] [address] Phone: [phone] Email: [email or web address]

## 2023-01-04 ENCOUNTER — Ambulatory Visit: Payer: Medicare PPO | Admitting: Physician Assistant

## 2023-01-04 ENCOUNTER — Encounter: Payer: Self-pay | Admitting: Physician Assistant

## 2023-01-04 VITALS — BP 130/74 | HR 89 | Temp 98.1°F | Resp 16 | Ht 63.0 in | Wt 182.5 lb

## 2023-01-04 DIAGNOSIS — R1032 Left lower quadrant pain: Secondary | ICD-10-CM | POA: Diagnosis not present

## 2023-01-04 NOTE — Progress Notes (Signed)
Acute Office Visit   Patient: Jodi Kirby   DOB: August 07, 1946   77 y.o. Female  MRN: [id number] Visit Date: 01/04/2023  Today's healthcare provider: Dani Gobble Darrall Strey, PA-C  Introduced myself to the patient as a Journalist, newspaper and provided education on APPs in clinical practice.    Chief Complaint  Patient presents with   Follow-up    ER, pt states feeling much better   Subjective    HPI HPI     Follow-up    Additional comments: ER, pt states feeling much better      Last edited by Salomon Fick, CMA on 01/04/2023  9:16 AM.       Patient was seen in ED on 12/27/22 for left flank pain  Reviewed CBC, CMP, UA with micro, CT scan results which were all reassuring She was treated with 5% lidocaine patch and IV fluids - she reported improvement in symptoms and was discharged   Today she reports she is feeling much better today She reports her pain was in left lower abdominal quadrant and not in left flank  She reports she did not have any GI upset (denies nausea, vomiting, diarrhea) during episode She states as weekend went on she started having a sharp twisting pain in LLQ of abdomen until she decided to go to ED for evaluation  She states today that her pain is resolved She denies recurring or progressing symptoms (see ROS) as of today's apt.      Medications: Outpatient Medications Prior to Visit  Medication Sig   acetaminophen (TYLENOL) 650 MG CR tablet Take 650 mg by mouth daily as needed for pain.   ALPRAZolam (XANAX) 0.5 MG tablet Take 0.5 mg by mouth at bedtime as needed for anxiety.   Ascorbic Acid (VITAMIN C WITH ROSE HIPS) 1000 MG tablet Take 1,000 mg by mouth daily. With Zinc   aspirin EC 81 MG tablet Take 81 mg by mouth daily. Swallow whole.   baclofen (LIORESAL) 10 MG tablet Take 1 tablet (10 mg total) by mouth at bedtime as needed for muscle spasms.   Cholecalciferol (VITAMIN D3) 1000 UNITS CAPS Take 1,000 Units by mouth every morning.     denosumab (PROLIA) 60 MG/ML SOSY injection Inject 60 mg into the skin every 6 (six) months.   EPINEPHrine (EPI-PEN) 0.3 mg/0.3 mL DEVI Inject 0.3 mg into the muscle as needed (anaphylaxis).   fluticasone (FLONASE) 50 MCG/ACT nasal spray USE 2 SPRAY(S) IN EACH NOSTRIL AT BEDTIME   Multiple Vitamins-Minerals (CENTRUM SILVER PO) Take 1 tablet by mouth every morning.    nitroGLYCERIN (NITROSTAT) 0.4 MG SL tablet Place 1 tablet (0.4 mg total) under the tongue every 5 (five) minutes as needed for chest pain.   Olmesartan-amLODIPine-HCTZ 40-10-12.5 MG TABS Take 1 tablet by mouth daily.   omeprazole (PRILOSEC) 20 MG capsule Take 1 capsule by mouth once daily   polyethylene glycol (MIRALAX / GLYCOLAX) 17 g packet Take 17 g by mouth daily.   No facility-administered medications prior to visit.    Review of Systems  Constitutional:  Negative for chills, diaphoresis, fatigue and fever.  Gastrointestinal:  Negative for abdominal distention, abdominal pain, blood in stool, constipation, diarrhea, nausea and vomiting.  Genitourinary:  Negative for dysuria, flank pain, vaginal bleeding, vaginal discharge and vaginal pain.  Neurological:  Negative for dizziness, light-headedness and headaches.       Objective    BP 130/74   Pulse 89   Temp 98.1  F (36.7 C) (Oral)   Resp 16   Ht '5\' 3"'$  (1.6 m)   Wt 182 lb 8 oz (82.8 kg)   SpO2 100%   BMI 32.33 kg/m    Physical Exam Vitals reviewed.  Constitutional:      General: She is awake.     Appearance: Normal appearance. She is well-developed and well-groomed.  HENT:     Head: Normocephalic.  Cardiovascular:     Rate and Rhythm: Normal rate and regular rhythm.     Heart sounds: Normal heart sounds. No murmur heard.    No friction rub. No gallop.  Pulmonary:     Effort: Pulmonary effort is normal.     Breath sounds: Normal breath sounds. No decreased air movement. No decreased breath sounds, wheezing, rhonchi or rales.  Abdominal:     General:  Abdomen is flat. Bowel sounds are normal.     Palpations: Abdomen is soft.     Tenderness: There is no abdominal tenderness. There is no right CVA tenderness, left CVA tenderness or guarding. Negative signs include Murphy's sign and McBurney's sign.  Musculoskeletal:     Cervical back: Normal range of motion.  Skin:    General: Skin is warm and dry.  Neurological:     General: No focal deficit present.     Mental Status: She is alert and oriented to person, place, and time. Mental status is at baseline.  Psychiatric:        Mood and Affect: Mood normal.        Behavior: Behavior normal. Behavior is cooperative.        Thought Content: Thought content normal.        Judgment: Judgment normal.       No results found for any visits on 01/04/23.  Assessment & Plan      No follow-ups on file.      Problem List Items Addressed This Visit   None Visit Diagnoses     Abdominal pain, LLQ    -  Primary Acute, resolved concern Patient was seen in the ED on 12/27/22 for LLQ pain and left flank pain Reviewed ED visit notes, imaging, and labs which were all reassuring minus findings of diverticulosis in sigmoid colon  She reports symptoms are improved as of today  Recommend she continue with home measures as directed from ED and follow up in office for recurrent or progressing concerns.          No follow-ups on file.   I, Genita Nilsson E Felipe Cabell, PA-C, have reviewed all documentation for this visit. The documentation on 01/04/23 for the exam, diagnosis, procedures, and orders are all accurate and complete.   Talitha Givens, MHS, PA-C [facility]

## 2023-01-07 DIAGNOSIS — E559 Vitamin D deficiency, unspecified: Secondary | ICD-10-CM | POA: Diagnosis not present

## 2023-01-07 DIAGNOSIS — M8588 Other specified disorders of bone density and structure, other site: Secondary | ICD-10-CM | POA: Diagnosis not present

## 2023-01-07 DIAGNOSIS — M810 Age-related osteoporosis without current pathological fracture: Secondary | ICD-10-CM | POA: Diagnosis not present

## 2023-01-07 LAB — HM DEXA SCAN

## 2023-01-09 ENCOUNTER — Other Ambulatory Visit: Payer: Self-pay | Admitting: Cardiovascular Disease

## 2023-01-09 DIAGNOSIS — Z789 Other specified health status: Secondary | ICD-10-CM

## 2023-01-09 DIAGNOSIS — E782 Mixed hyperlipidemia: Secondary | ICD-10-CM

## 2023-01-10 DIAGNOSIS — G4733 Obstructive sleep apnea (adult) (pediatric): Secondary | ICD-10-CM | POA: Diagnosis not present

## 2023-01-19 DIAGNOSIS — G4733 Obstructive sleep apnea (adult) (pediatric): Secondary | ICD-10-CM | POA: Diagnosis not present

## 2023-01-27 DIAGNOSIS — G4733 Obstructive sleep apnea (adult) (pediatric): Secondary | ICD-10-CM | POA: Diagnosis not present

## 2023-02-10 DIAGNOSIS — G4733 Obstructive sleep apnea (adult) (pediatric): Secondary | ICD-10-CM | POA: Diagnosis not present

## 2023-02-16 DIAGNOSIS — M810 Age-related osteoporosis without current pathological fracture: Secondary | ICD-10-CM | POA: Diagnosis not present

## 2023-02-19 DIAGNOSIS — G4733 Obstructive sleep apnea (adult) (pediatric): Secondary | ICD-10-CM | POA: Diagnosis not present

## 2023-02-22 DIAGNOSIS — H43813 Vitreous degeneration, bilateral: Secondary | ICD-10-CM | POA: Diagnosis not present

## 2023-02-22 DIAGNOSIS — Z961 Presence of intraocular lens: Secondary | ICD-10-CM | POA: Diagnosis not present

## 2023-02-25 ENCOUNTER — Other Ambulatory Visit: Payer: Self-pay | Admitting: Family Medicine

## 2023-02-26 NOTE — Progress Notes (Unsigned)
Name: Jodi Kirby   MRN: [id number]    DOB: 15-Jan-1946   Date:03/01/2023       Progress Note  Subjective  Chief Complaint  Follow Up  HPI  OSA: she usually wears her CPAP every night, she is able to keep it on for about 3 hours, discussed trying to wear it for 4 hours . She is back on Trazodone to help her stay asleep, she denies side effects at this time.   Insomnia: she was only sleeping sleeping for about 3 hours , she resumed some old trazodone and states tolerating it well now.    DDD lumbar spine:  History of spinal fusion and L3-4 .and revision in Feb 2022, she states surgery seems to have worked, she states pain is finally getting better, able to get up and walk now. She states back pain is intermittent, she feels sore and stiff and pain  at this time is 5/10 Sometimes has radiculitis down right lateral leg.    IMPRESSION: 10/09/2020  1. Mildly progressive disc degeneration at L2-3 with moderate spinal stenosis and mild-to-moderate left greater than right neural foraminal and lateral recess stenosis. 2. Postoperative changes at L3-4 and L4-5 with unchanged residual spinal and neural foraminal stenosis at L3-4.   HTN with CKI stage III : she is back on one pill Tribenzor  no recent episodes of chest pain or palpitation, denies orthopnea or dizziness. Marland KitchenGFR was slightly lower during [facility], discussed rechecking today or next visit and she chose the later.  She  denies pruritis, states good urine output , she has intermittent leg cramps she states not as frequent since she started to stretch and drink more water.    Hyperlipidemia/aorta atherosclerosis : unable to tolerate statins because it causes myopahty Dr. Mariah Milling started her on Zetia 07/2018 and initially she was tolerating it but cramps increased and medication was stopped end of 2023  We tried Vascepa but too costly, also unable to afford [id number]   Anemia: normal iron storage,  she is taking B12 today    Angina  Pectoris: she has CAD, mild disease, seeing Dr. Mariah Milling. Taking aspirin and ARB, not on beta-blocker because of bradycardia and not on statin because of intolerance, but is now on Zetia   Osteoporosis : diagnosed in 2016 - and was started on Fosamax, previous bone density in 2018, no side effects of medication. -2.4 spine -2.2 of femur , she had repeat study at [facility] 10/2020 and showed T score spine of -2.8 and femur -2.1 . She is seeing Dr. Gershon Crane at [facility], had SPEP, Pth and vitamin D done, first infusion of Prolia was 08/2021 , last dose April 2024 Last bone density March 2024 and improved, now osteopenia instead of osteoporosis    Hyperglycemia: she denies polyphagia, polydipsia or polyuria. A1C was 5.5% to 5.7% A1C 5.4 % and 5.6 % ,she tries to follow a low sugar diet    Mild cognitive dysfunction: CIT was 8 in 2019, down to 4 in 2020 and last checked in 2021 was down to 2, her MMS 05/2022 was normal at 30 . She has been doing  cross work puzzles, Armed forces logistics/support/administrative officer. She still drives, take medications on her own, pays her bills, groceries shopping . Stable   GERD: she had EGD in 2023 that showed hiatal hernia, gastritis and was doing well on Omeprazole 20 mg, however noticing more heartburn lately, we will adjust dose to 40 mg and if no improvement we will send  her back to GI   Patient Active Problem List   Diagnosis Date Noted   Vitamin D deficiency 04/27/2022   Stage 3a chronic kidney disease (HCC) 04/27/2022   Vitamin B12 deficiency 12/29/2021   Anemia of chronic disease 11/19/2021   Lumbar stenosis 12/16/2020   Benign hypertension with chronic kidney disease, stage III (HCC) 12/22/2018   History of lumbar fusion 07/19/2018   DDD (degenerative disc disease), lumbosacral 07/19/2018   Obesity (BMI 30.0-34.9) 07/19/2018   Mild cognitive impairment 12/21/2017   Aortic atherosclerosis (HCC) 05/15/2017   Impingement syndrome of left shoulder 05/12/2016   Insomnia 11/08/2015   Angina  pectoris (HCC) 07/10/2015   Seasonal allergic rhinitis 07/09/2015   Gastroesophageal reflux disease without esophagitis 07/09/2015   Bee sting allergy 07/09/2015   History of knee replacement procedure of right knee 04/29/2015   History of colonic polyps 09/24/2014   Fibrocystic breast 09/24/2014   Dyslipidemia 02/09/2013   Atherosclerosis of native coronary artery of native heart with stable angina pectoris (HCC) 02/09/2013   Obstructive sleep apnea on CPAP 02/09/2013   Hypertension 02/09/2013    Past Surgical History:  Procedure Laterality Date   ABDOMINAL HYSTERECTOMY  1970   ANKLE SURGERY Right 1980   ANTERIOR LATERAL LUMBAR FUSION WITH PERCUTANEOUS SCREW 2 LEVEL N/A 12/16/2020   Procedure: L2/3, L3/4 LATERAL LUMBAR FUSION, L2-4 PEDICLE SCREW FIXATION;  Surgeon: Lucy Chris, MD;  Location: [facility] ORS;  Service: Neurosurgery;  Laterality: N/A;   BLADDER SURGERY  2012   CARDIAC CATHETERIZATION  2003   [place]   CARDIAC CATHETERIZATION     [place]   CATARACT EXTRACTION W/PHACO Right 02/09/2018   Procedure: CATARACT EXTRACTION PHACO AND INTRAOCULAR LENS PLACEMENT (IOC) RIGHT;  Surgeon: Lockie Mola, MD;  Location: [facility];  Service: Ophthalmology;  Laterality: Right;  sleep apnea   CATARACT EXTRACTION W/PHACO Left 03/09/2018   Procedure: CATARACT EXTRACTION PHACO AND INTRAOCULAR LENS PLACEMENT (IOC);  Surgeon: Lockie Mola, MD;  Location: [facility];  Service: Ophthalmology;  Laterality: Left;  IVA TOPICALLEFT   COLONOSCOPY  2008, 2015   Dr. Servando Snare   COLONOSCOPY N/A 03/05/2022   Procedure: COLONOSCOPY;  Surgeon: Jaynie Collins, DO;  Location: [facility] END[facility]COPY;  Service: Gastroenterology;  Laterality: N/A;   CYSTOSTOMY     ESOPHAGOGASTRODUODENOSCOPY N/A 03/05/2022   Procedure: ESOPHAGOGASTRODUODENOSCOPY (EGD);  Surgeon: Jaynie Collins, DO;  Location: [facility] ENDOSCOPY;  Service: Gastroenterology;  Laterality: N/A;   KNEE SURGERY  08/18/2011    arthroscopic right   LUMBAR FUSION     L4-5, S-1   NECK SURGERY  2003   TOTAL KNEE ARTHROPLASTY Right 04/29/2015   Procedure: TOTAL KNEE ARTHROPLASTY;  Surgeon: Erin Sons, MD;  Location: [facility] ORS;  Service: Orthopedics;  Laterality: Right;   TUBAL LIGATION      Family History  Problem Relation Age of Onset   Kidney disease Mother    Hypertension Mother    CAD Father    Healthy Sister    Kidney disease Son    Kidney cancer Neg Hx    Bladder Cancer Neg Hx     Social History   Tobacco Use   Smoking status: Never   Smokeless tobacco: Never   Tobacco comments:    smoking cessation materials not required  Substance Use Topics   Alcohol use: No    Alcohol/week: 0.0 standard drinks of alcohol     Current Outpatient Medications:    acetaminophen (TYLENOL) 650 MG CR tablet, Take 650 mg by mouth daily as needed  for pain., Disp: , Rfl:    ALPRAZolam (XANAX) 0.5 MG tablet, Take 0.5 mg by mouth at bedtime as needed for anxiety., Disp: , Rfl:    Ascorbic Acid (VITAMIN C WITH ROSE HIPS) 1000 MG tablet, Take 1,000 mg by mouth daily. With Zinc, Disp: , Rfl:    aspirin EC 81 MG tablet, Take 81 mg by mouth daily. Swallow whole., Disp: , Rfl:    baclofen (LIORESAL) 10 MG tablet, Take 1 tablet (10 mg total) by mouth at bedtime as needed for muscle spasms., Disp: 90 tablet, Rfl: 1   Cholecalciferol (VITAMIN D3) 1000 UNITS CAPS, Take 1,000 Units by mouth every morning. , Disp: , Rfl:    denosumab (PROLIA) 60 MG/ML SOSY injection, Inject 60 mg into the skin every 6 (six) months., Disp: , Rfl:    EPINEPHrine (EPI-PEN) 0.3 mg/0.3 mL DEVI, Inject 0.3 mg into the muscle as needed (anaphylaxis)., Disp: , Rfl:    fluticasone (FLONASE) 50 MCG/ACT nasal spray, USE 2 SPRAY(S) IN EACH NOSTRIL AT BEDTIME, Disp: 48 g, Rfl: 0   Multiple Vitamins-Minerals (CENTRUM SILVER PO), Take 1 tablet by mouth every morning. , Disp: , Rfl:    nitroGLYCERIN (NITROSTAT) 0.4 MG SL tablet, Place 1 tablet (0.4 mg total)  under the tongue every 5 (five) minutes as needed for chest pain., Disp: 25 tablet, Rfl: 0   Olmesartan-amLODIPine-HCTZ 40-10-12.5 MG TABS, Take 1 tablet by mouth daily., Disp: 90 tablet, Rfl: 0   omeprazole (PRILOSEC) 20 MG capsule, Take 1 capsule by mouth once daily, Disp: 90 capsule, Rfl: 0   polyethylene glycol (MIRALAX / GLYCOLAX) 17 g packet, Take 17 g by mouth daily., Disp: 14 each, Rfl: 0  Allergies  Allergen Reactions   Niaspan [Niacin Er] Other (See Comments)    Patient states she can't remember the reaction because it so long ago.   Oxycodone-Acetaminophen Nausea Only   Statins Other (See Comments)    Muscle and joint pain.    I personally reviewed active problem list, medication list, allergies, family history, social history, health maintenance with the patient/caregiver today.   ROS  Ten systems reviewed and is negative except as mentioned in HPI   Objective  Vitals:   03/01/23 0925  BP: 124/66  Pulse: 84  Resp: 16  SpO2: 98%  Weight: 184 lb (83.5 kg)  Height: 5\' 2"  (1.575 m)    Body mass index is 33.65 kg/m.  Physical Exam  Constitutional: Patient appears well-developed and well-nourished. Obese  No distress.  HEENT: head atraumatic, normocephalic, pupils equal and reactive to light, neck supple Cardiovascular: Normal rate, regular rhythm and normal heart sounds.  No murmur heard. No BLE edema. Pulmonary/Chest: Effort normal and breath sounds normal. No respiratory distress. Abdominal: Soft.  There is no tenderness. Psychiatric: Patient has a normal mood and affect. behavior is normal. Judgment and thought content normal.   Recent Results (from the past 2160 hour(s))  Lipase, blood     Status: None   Collection Time: 12/27/22  7:09 PM  Result Value Ref Range   Lipase 44 11 - 51 U/L    Comment: Performed at [facility], [address] [place], [place] [address]  Comprehensive metabolic panel     Status: Abnormal   Collection Time: 12/27/22   7:09 PM  Result Value Ref Range   Sodium 140 135 - 145 mmol/L   Potassium 3.9 3.5 - 5.1 mmol/L   Chloride 104 98 - 111 mmol/L   CO2 26 22 -  32 mmol/L   Glucose, Bld 129 (H) 70 - 99 mg/dL    Comment: Glucose reference range applies only to samples taken after fasting for at least 8 hours.   BUN 23 8 - 23 mg/dL   Creatinine, Ser 0.45 (H) 0.44 - 1.00 mg/dL   Calcium 9.6 8.9 - 40.9 mg/dL   Total Protein 7.3 6.5 - 8.1 g/dL   Albumin 4.3 3.5 - 5.0 g/dL   AST 21 15 - 41 U/L   ALT 22 0 - 44 U/L   Alkaline Phosphatase 70 38 - 126 U/L   Total Bilirubin 0.6 0.3 - 1.2 mg/dL   GFR, Estimated 38 (L) >60 mL/min    Comment: (NOTE) Calculated using the CKD-EPI Creatinine Equation (2021)    Anion gap 10 5 - 15    Comment: Performed at [facility], [address] [place], [place] [address]  CBC     Status: Abnormal   Collection Time: 12/27/22  7:09 PM  Result Value Ref Range   WBC 9.2 4.0 - 10.5 K/uL   RBC 3.86 (L) 3.87 - 5.11 MIL/uL   Hemoglobin 10.8 (L) 12.0 - 15.0 g/dL   HCT 47.8 (L) 29.5 - 62.1 %   MCV 83.7 80.0 - 100.0 fL   MCH 28.0 26.0 - 34.0 pg   MCHC 33.4 30.0 - 36.0 g/dL   RDW 30.8 65.7 - 84.6 %   Platelets 286 150 - 400 K/uL   nRBC 0.0 0.0 - 0.2 %    Comment: Performed at [facility], [address] [place], [place] [address]  Urinalysis, Routine w reflex microscopic -Urine, Clean Catch     Status: Abnormal   Collection Time: 12/27/22  7:09 PM  Result Value Ref Range   Color, Urine STRAW (A) YELLOW   APPearance CLEAR (A) CLEAR   Specific Gravity, Urine 1.011 1.005 - 1.030   pH 6.0 5.0 - 8.0   Glucose, UA NEGATIVE NEGATIVE mg/dL   Hgb urine dipstick NEGATIVE NEGATIVE   Bilirubin Urine NEGATIVE NEGATIVE   Ketones, ur NEGATIVE NEGATIVE mg/dL   Protein, ur NEGATIVE NEGATIVE mg/dL   Nitrite NEGATIVE NEGATIVE   Leukocytes,Ua SMALL (A) NEGATIVE   RBC / HPF 0-5 0 - 5 RBC/hpf   WBC, UA 0-5 0 - 5 WBC/hpf   Bacteria, UA RARE (A) NONE SEEN   Squamous  Epithelial / HPF 0-5 0 - 5 /HPF   Mucus PRESENT     Comment: Performed at [facility], [address] [place], [place] [address]  Troponin I (High Sensitivity)     Status: None   Collection Time: 12/27/22  7:09 PM  Result Value Ref Range   Troponin I (High Sensitivity) 8 <18 ng/L    Comment: (NOTE) Elevated high sensitivity troponin I (hsTnI) values and significant  changes across serial measurements may suggest ACS but many other  chronic and acute conditions are known to elevate hsTnI results.  Refer to the "Links" section for chest pain algorithms and additional  guidance. Performed at [facility], [address] [place], [place] [address]   HM DEXA SCAN     Status: None   Collection Time: 01/07/23 12:00 AM  Result Value Ref Range   HM Dexa Scan Care Everywhere     PHQ2/9:    03/01/2023    9:25 AM 01/04/2023    9:15 AM 08/28/2022    9:29 AM 05/21/2022    1:40 PM 04/27/2022    8:45 AM  Depression  screen PHQ 2/9  Decreased Interest 0 0 0 0 0  Down, Depressed, Hopeless 0 0 0 0 0  PHQ - 2 Score 0 0 0 0 0  Altered sleeping 3 0 0 1 0  Tired, decreased energy 0 0 0 0 0  Change in appetite 0 0 0 0 0  Feeling bad or failure about yourself  0 0 0 0 0  Trouble concentrating 0 0 0 0 0  Moving slowly or fidgety/restless 0 0 0 0 0  Suicidal thoughts 0 0 0 0 0  PHQ-9 Score 3 0 0 1 0  Difficult doing work/chores  Not difficult at all       phq 9 is negative   Fall Risk:    03/01/2023    9:25 AM 01/04/2023    9:15 AM 08/28/2022    9:28 AM 05/21/2022    1:49 PM 04/27/2022    8:44 AM  Fall Risk   Falls in the past year? 0 0 0 0 0  Number falls in past yr: 0 0   0  Injury with Fall? 0 0   0  Risk for fall due to : No Fall Risks No Fall Risks No Fall Risks No Fall Risks No Fall Risks  Follow up Falls prevention discussed Falls prevention discussed;Education provided;Falls evaluation completed Falls prevention discussed;Education provided;Falls evaluation  completed Falls prevention discussed;Education provided;Falls evaluation completed Falls prevention discussed      Functional Status Survey: Is the patient deaf or have difficulty hearing?: No Does the patient have difficulty seeing, even when wearing glasses/contacts?: No Does the patient have difficulty concentrating, remembering, or making decisions?: No Does the patient have difficulty walking or climbing stairs?: No Does the patient have difficulty dressing or bathing?: No Does the patient have difficulty doing errands alone such as visiting a doctor's office or shopping?: No    Assessment & Plan  1. Aortic atherosclerosis (HCC)  Not on medications at this time due to side effects  2. Stage 3a chronic kidney disease (HCC)  Reviewed recent labs   3. Benign hypertension with chronic kidney disease, stage III (HCC)  Recheck labs next visit   4. Essential hypertension  At goal   5. Angina pectoris (HCC)  Denies any symptoms at this time   6. Anemia of chronic disease  Recheck next visit   7. Vitamin B12 deficiency  On otc B12   8. Dyslipidemia  She is no longer taking Zetia - states it was causing increase in cramps   9. Gastroesophageal reflux disease without esophagitis  - omeprazole (PRILOSEC) 40 MG capsule; Take 1 capsule (40 mg total) by mouth daily. New dose  Dispense: 90 capsule; Refill: 0  10. Mild cognitive impairment  stable  11. Obstructive sleep apnea on CPAP  Continue CPAP   12. Perennial allergic rhinitis with seasonal variation  - fluticasone (FLONASE) 50 MCG/ACT nasal spray; Place 2 sprays into both nostrils daily.  Dispense: 48 g; Refill: 1  13. DDD (degenerative disc disease), lumbosacral   14. Statin myopathy   15. Vitamin D deficiency  Continue supplementation   16. History of gastritis  - omeprazole (PRILOSEC) 40 MG capsule; Take 1 capsule (40 mg total) by mouth daily. New dose  Dispense: 90 capsule; Refill: 0  17.  Hiatal hernia  - omeprazole (PRILOSEC) 40 MG capsule; Take 1 capsule (40 mg total) by mouth daily. New dose  Dispense: 90 capsule; Refill: 0  18. Chronic insomnia  - traZODone (DESYREL) 50 MG  tablet; Take 1 tablet (50 mg total) by mouth at bedtime as needed for sleep.  Dispense: 90 tablet; Refill: 0

## 2023-03-01 ENCOUNTER — Encounter: Payer: Self-pay | Admitting: Family Medicine

## 2023-03-01 ENCOUNTER — Ambulatory Visit: Payer: Medicare PPO | Admitting: Family Medicine

## 2023-03-01 VITALS — BP 124/66 | HR 84 | Resp 16 | Ht 62.0 in | Wt 184.0 lb

## 2023-03-01 DIAGNOSIS — E559 Vitamin D deficiency, unspecified: Secondary | ICD-10-CM

## 2023-03-01 DIAGNOSIS — I129 Hypertensive chronic kidney disease with stage 1 through stage 4 chronic kidney disease, or unspecified chronic kidney disease: Secondary | ICD-10-CM

## 2023-03-01 DIAGNOSIS — I10 Essential (primary) hypertension: Secondary | ICD-10-CM | POA: Diagnosis not present

## 2023-03-01 DIAGNOSIS — N1831 Chronic kidney disease, stage 3a: Secondary | ICD-10-CM

## 2023-03-01 DIAGNOSIS — D638 Anemia in other chronic diseases classified elsewhere: Secondary | ICD-10-CM

## 2023-03-01 DIAGNOSIS — J3089 Other allergic rhinitis: Secondary | ICD-10-CM

## 2023-03-01 DIAGNOSIS — I700 Atherosclerosis of aorta: Secondary | ICD-10-CM

## 2023-03-01 DIAGNOSIS — F5104 Psychophysiologic insomnia: Secondary | ICD-10-CM

## 2023-03-01 DIAGNOSIS — J302 Other seasonal allergic rhinitis: Secondary | ICD-10-CM

## 2023-03-01 DIAGNOSIS — E785 Hyperlipidemia, unspecified: Secondary | ICD-10-CM

## 2023-03-01 DIAGNOSIS — K219 Gastro-esophageal reflux disease without esophagitis: Secondary | ICD-10-CM

## 2023-03-01 DIAGNOSIS — G720 Drug-induced myopathy: Secondary | ICD-10-CM

## 2023-03-01 DIAGNOSIS — G4733 Obstructive sleep apnea (adult) (pediatric): Secondary | ICD-10-CM

## 2023-03-01 DIAGNOSIS — Z8719 Personal history of other diseases of the digestive system: Secondary | ICD-10-CM

## 2023-03-01 DIAGNOSIS — T466X5A Adverse effect of antihyperlipidemic and antiarteriosclerotic drugs, initial encounter: Secondary | ICD-10-CM

## 2023-03-01 DIAGNOSIS — K449 Diaphragmatic hernia without obstruction or gangrene: Secondary | ICD-10-CM

## 2023-03-01 DIAGNOSIS — I209 Angina pectoris, unspecified: Secondary | ICD-10-CM

## 2023-03-01 DIAGNOSIS — M5137 Other intervertebral disc degeneration, lumbosacral region: Secondary | ICD-10-CM

## 2023-03-01 DIAGNOSIS — N183 Chronic kidney disease, stage 3 unspecified: Secondary | ICD-10-CM

## 2023-03-01 DIAGNOSIS — E538 Deficiency of other specified B group vitamins: Secondary | ICD-10-CM

## 2023-03-01 DIAGNOSIS — G3184 Mild cognitive impairment, so stated: Secondary | ICD-10-CM

## 2023-03-01 MED ORDER — FLUTICASONE PROPIONATE 50 MCG/ACT NA SUSP: 2.0000 | Freq: Every day | NASAL | 1 refills | Status: DC

## 2023-03-01 MED ORDER — OMEPRAZOLE 40 MG PO CPDR: 40.0000 mg | DELAYED_RELEASE_CAPSULE | Freq: Every day | ORAL | 0 refills | Status: DC

## 2023-03-01 MED ORDER — TRAZODONE HCL 50 MG PO TABS: 50.0000 mg | ORAL_TABLET | Freq: Every evening | ORAL | 0 refills | Status: DC | PRN

## 2023-03-21 DIAGNOSIS — G4733 Obstructive sleep apnea (adult) (pediatric): Secondary | ICD-10-CM | POA: Diagnosis not present

## 2023-04-21 DIAGNOSIS — G4733 Obstructive sleep apnea (adult) (pediatric): Secondary | ICD-10-CM | POA: Diagnosis not present

## 2023-04-28 ENCOUNTER — Other Ambulatory Visit: Payer: Self-pay | Admitting: Cardiovascular Disease

## 2023-04-28 DIAGNOSIS — I10 Essential (primary) hypertension: Secondary | ICD-10-CM

## 2023-05-21 DIAGNOSIS — G4733 Obstructive sleep apnea (adult) (pediatric): Secondary | ICD-10-CM | POA: Diagnosis not present

## 2023-05-21 NOTE — Progress Notes (Unsigned)
Patient: Jodi Kirby, Female    DOB: 12-27-1945, 77 y.o.   MRN: [id number]  Visit Date: 05/24/2023  Today's Provider: [name], MD   Medicare Wellness  Subjective:    HPI Jodi Kirby is a 77 y.o. female who presents today for her Subsequent Annual Wellness Visit.  Patient/Caregiver input:  she has intermittent left lower quadrant pain - history of diverticulitis   Review of Systems  Constitutional: Negative for fever or weight change.  Respiratory: Negative for cough and shortness of breath.   Cardiovascular: Negative for chest pain or palpitations.  Gastrointestinal: recurrent left lower abdominal pain, no bowel changes.  Musculoskeletal: Negative for gait problem or joint swelling.  Skin: Negative for rash.  Neurological: Negative for dizziness or headache.  No other specific complaints in a complete review of systems (except as listed in HPI above).  Past Medical History:  Diagnosis Date   Allergic rhinitis, cause unspecified    Allergy    Anxiety    Chronic kidney disease    Coronary artery disease    DDD (degenerative disc disease), lumbar    Dysmetabolic syndrome X    Dysuria    Esophageal reflux    Essential hypertension, benign    HNP (herniated nucleus pulposus), lumbar    Dr. Yves Dill [facility])   Hyperlipidemia    Lumbago    Lumbar radiculitis    Dr. Yves Dill [facility])   Lump or mass in breast    LEFT BREAST   Myalgia and myositis, unspecified    Myocardial infarction (HCC)    Obstructive sleep apnea    CPAP   Osteoarthrosis, unspecified whether generalized or localized, lower leg    Other abnormal glucose    Other and unspecified angina pectoris    Personal history of malignant neoplasm of other endocrine glands and related structures    Toxic effect of venom(989.5)    Unspecified iridocyclitis    Unspecified vitamin D deficiency    Vertigo 2018   1 episode    Past Surgical History:  Procedure Laterality Date   ABDOMINAL HYSTERECTOMY   1970   ANKLE SURGERY Right 1980   ANTERIOR LATERAL LUMBAR FUSION WITH PERCUTANEOUS SCREW 2 LEVEL N/A 12/16/2020   Procedure: L2/3, L3/4 LATERAL LUMBAR FUSION, L2-4 PEDICLE SCREW FIXATION;  Surgeon: Lucy Chris, MD;  Location: [facility] ORS;  Service: Neurosurgery;  Laterality: N/A;   BLADDER SURGERY  2012   CARDIAC CATHETERIZATION  2003   [place]   CARDIAC CATHETERIZATION     [place]   CATARACT EXTRACTION W/PHACO Right 02/09/2018   Procedure: CATARACT EXTRACTION PHACO AND INTRAOCULAR LENS PLACEMENT (IOC) RIGHT;  Surgeon: Lockie Mola, MD;  Location: [facility] SURGERY CNTR;  Service: Ophthalmology;  Laterality: Right;  sleep apnea   CATARACT EXTRACTION W/PHACO Left 03/09/2018   Procedure: CATARACT EXTRACTION PHACO AND INTRAOCULAR LENS PLACEMENT (IOC);  Surgeon: Lockie Mola, MD;  Location: [facility];  Service: Ophthalmology;  Laterality: Left;  IVA TOPICALLEFT   COLONOSCOPY  2008, 2015   Dr. Servando Snare   COLONOSCOPY N/A 03/05/2022   Procedure: COLONOSCOPY;  Surgeon: Jaynie Collins, DO;  Location: [facility] ENDOSCOPY;  Service: Gastroenterology;  Laterality: N/A;   CYSTOSTOMY     ESOPHAGOGASTRODUODENOSCOPY N/A 03/05/2022   Procedure: ESOPHAGOGASTRODUODENOSCOPY (EGD);  Surgeon: Jaynie Collins, DO;  Location: [facility] ENDOSCOPY;  Service: Gastroenterology;  Laterality: N/A;   KNEE SURGERY  08/18/2011   arthroscopic right   LUMBAR FUSION     L4-5, S-1   NECK SURGERY  2003   TOTAL  KNEE ARTHROPLASTY Right 04/29/2015   Procedure: TOTAL KNEE ARTHROPLASTY;  Surgeon: Erin Sons, MD;  Location: [facility] ORS;  Service: Orthopedics;  Laterality: Right;   TUBAL LIGATION      Family History  Problem Relation Age of Onset   Kidney disease Mother    Hypertension Mother    CAD Father    Healthy Sister    Kidney disease Son    Kidney cancer Neg Hx    Bladder Cancer Neg Hx     Social History   Socioeconomic History   Marital status: Married    Spouse name: Morris   Number of  children: 4   Years of education: Not on file   Highest education level: 11th grade  Occupational History   Occupation: Retired  Tobacco Use   Smoking status: Never   Smokeless tobacco: Never   Tobacco comments:    smoking cessation materials not required  Vaping Use   Vaping status: Never Used  Substance and Sexual Activity   Alcohol use: No    Alcohol/week: 0.0 standard drinks of alcohol   Drug use: No   Sexual activity: Not Currently    Partners: Male    Birth control/protection: None  Other Topics Concern   Not on file  Social History Narrative   Not on file   Social Determinants of Health   Financial Resource Strain: Low Risk  (05/24/2023)   Overall Financial Resource Strain (CARDIA)    Difficulty of Paying Living Expenses: Not hard at all  Food Insecurity: No Food Insecurity (05/24/2023)   Hunger Vital Sign    Worried About Running Out of Food in the Last Year: Never true    Ran Out of Food in the Last Year: Never true  Transportation Needs: No Transportation Needs (05/24/2023)   PRAPARE - Administrator, Civil Service (Medical): No    Lack of Transportation (Non-Medical): No  Physical Activity: Insufficiently Active (05/24/2023)   Exercise Vital Sign    Days of Exercise per Week: 3 days    Minutes of Exercise per Session: 10 min  Stress: No Stress Concern Present (05/24/2023)   [employer] of Occupational Health - Occupational Stress Questionnaire    Feeling of Stress : Not at all  Social Connections: Moderately Integrated (05/24/2023)   Social Connection and Isolation Panel [NHANES]    Frequency of Communication with Friends and Family: More than three times a week    Frequency of Social Gatherings with Friends and Family: Twice a week    Attends Religious Services: More than 4 times per year    Active Member of [employer] or Organizations: No    Attends Banker Meetings: Never    Marital Status: Married  Catering manager Violence: Not At  Risk (05/24/2023)   Humiliation, Afraid, Rape, and Kick questionnaire    Fear of Current or Ex-Partner: No    Emotionally Abused: No    Physically Abused: No    Sexually Abused: No    Outpatient Encounter Medications as of 05/24/2023  Medication Sig Note   acetaminophen (TYLENOL) 650 MG CR tablet Take 650 mg by mouth daily as needed for pain.    ALPRAZolam (XANAX) 0.5 MG tablet Take 0.5 mg by mouth at bedtime as needed for anxiety.    Ascorbic Acid (VITAMIN C WITH ROSE HIPS) 1000 MG tablet Take 1,000 mg by mouth daily. With Zinc    aspirin EC 81 MG tablet Take 81 mg by mouth daily. Swallow whole.  baclofen (LIORESAL) 10 MG tablet Take 1 tablet (10 mg total) by mouth at bedtime as needed for muscle spasms.    Cholecalciferol (VITAMIN D3) 1000 UNITS CAPS Take 1,000 Units by mouth every morning.     denosumab (PROLIA) 60 MG/ML SOSY injection Inject 60 mg into the skin every 6 (six) months.    EPINEPHrine (EPI-PEN) 0.3 mg/0.3 mL DEVI Inject 0.3 mg into the muscle as needed (anaphylaxis).    fluticasone (FLONASE) 50 MCG/ACT nasal spray Place 2 sprays into both nostrils daily.    Multiple Vitamins-Minerals (CENTRUM SILVER PO) Take 1 tablet by mouth every morning.     nitroGLYCERIN (NITROSTAT) 0.4 MG SL tablet Place 1 tablet (0.4 mg total) under the tongue every 5 (five) minutes as needed for chest pain.    Olmesartan-amLODIPine-HCTZ 40-10-12.5 MG TABS Take 1 tablet by mouth once daily    omeprazole (PRILOSEC) 40 MG capsule Take 1 capsule (40 mg total) by mouth daily. New dose    polyethylene glycol (MIRALAX / GLYCOLAX) 17 g packet Take 17 g by mouth daily. 05/13/2021: PRN   traZODone (DESYREL) 50 MG tablet Take 1 tablet (50 mg total) by mouth at bedtime as needed for sleep.    No facility-administered encounter medications on file as of 05/24/2023.    Allergies  Allergen Reactions   Niaspan [Niacin Er] Other (See Comments)    Patient states she can't remember the reaction because it so long  ago.   Oxycodone-Acetaminophen Nausea Only   Statins Other (See Comments)    Muscle and joint pain.    Care Team Updated in EHR: Yes  Last Vision Exam: in 2023  Wears corrective lenses: Yes Last Dental Exam: due for an appointment  Last Hearing Exam: not lately  Wears Hearing Aids: No  Functional Ability / Safety Screening 1.  Was the timed Get Up and Go test shorter than 30 seconds?  yes 2.  Does the patient need help with the phone, transportation, shopping,      preparing meals, housework, laundry, medications, or managing money?  yes 3.  Is the patient's home free of loose throw rugs in walkways, pet beds, electrical cords, etc?   no      Grab bars in the bathroom? yes      Handrails on the stairs?   yes      Adequate lighting?   yes 4.  Has the patient noticed any hearing difficulties?   no  Diet Recall and Exercise Regimen:  Breakfast: Cherrios Snacks: fruit or chips  Supper : balanced  Discussed importance of increasing protein intake, consider eggs or greek yogurt  in place of cereal for breakfast   Advanced Care Planning: A voluntary discussion about advance care planning including the explanation and discussion of advance directives.  Discussed health care proxy and Living will, and the patient was able to identify a health care proxy as husband.  Patient does not have a living will at present time. If patient does have living will, I have requested they bring this to the clinic to be scanned in to their chart. Does patient have a HCPOA?    no If yes, name and contact information: N/A Does patient have a living will or MOST form?  no  Cancer Screenings: Skin: discussed atypical lesions  Lung: Low Dose CT Chest recommended if Age 55-80 years, 30 pack-year currently smoking OR have quit w/in 15years. Patient does not qualify. Breast: Up to date on Mammogram? Yes  Up to date of  Bone Density/Dexa? Yes Colon: Colonoscopy 03/05/22  Additional Screenings: Hepatitis  B/HIV/Syphillis: N/A Hepatitis C Screening: refused  Intimate Partner Violence: Negative  Objective:   Vitals: BP 120/62 (BP Location: Right Arm, Patient Position: Sitting, Cuff Size: Normal)   Pulse 76   Temp 97.8 F (36.6 C) (Oral)   Resp 14   Ht 5' 2.5" (1.588 m) Comment: per patient  Wt 182 lb (82.6 kg)   SpO2 99%   BMI 32.76 kg/m  Body mass index is 32.76 kg/m.  No results found.  Physical Exam Constitutional: Patient appears well-developed and well-nourished. Obese  No distress.  HEENT: head atraumatic, normocephalic, pupils equal and reactive to light, ears normal TM, neck supple, throat within normal limits Cardiovascular: Normal rate, regular rhythm and normal heart sounds.  No murmur heard. No BLE edema. Pulmonary/Chest: Effort normal and breath sounds normal. No respiratory distress. Abdominal: Soft.  There is no tenderness. Psychiatric: Patient has a normal mood and affect. behavior is normal. Judgment and thought content normal.  Cognitive Testing - 6-CIT  Correct? Score   What year is it? yes 0 Yes = 0    No = 4  What month is it? yes 0 Yes = 0    No = 3  Remember:     Floyde Parkins, [address], [place]     What time is it? yes 0 Yes = 0    No = 3  Count backwards from 20 to 1 yes 0 Correct = 0    1 error = 2   More than 1 error = 4  Say the months of the year in reverse. yes 0 Correct = 0    1 error = 2   More than 1 error = 4  What address did I ask you to remember? yes 0 Correct = 0  1 error = 2    2 error = 4    3 error = 6    4 error = 8    All wrong = 10       TOTAL SCORE  0/28   Interpretation:  Normal  Normal (0-7) Abnormal (8-28)   Fall Risk:    05/24/2023    9:59 AM 03/01/2023    9:25 AM 01/04/2023    9:15 AM 08/28/2022    9:28 AM 05/21/2022    1:49 PM  Fall Risk   Falls in the past year? 0 0 0 0 0  Number falls in past yr:  0 0    Injury with Fall?  0 0    Risk for fall due to : No Fall Risks No Fall Risks No Fall Risks No Fall Risks No  Fall Risks  Follow up Falls prevention discussed;Education provided;Falls evaluation completed Falls prevention discussed Falls prevention discussed;Education provided;Falls evaluation completed Falls prevention discussed;Education provided;Falls evaluation completed Falls prevention discussed;Education provided;Falls evaluation completed    Depression Screen    05/24/2023    9:59 AM 05/24/2023    9:54 AM 03/01/2023    9:25 AM 01/04/2023    9:15 AM 08/28/2022    9:29 AM  Depression screen PHQ 2/9  Decreased Interest 0 0 0 0 0  Down, Depressed, Hopeless 0 0 0 0 0  PHQ - 2 Score 0 0 0 0 0  Altered sleeping 0  3 0 0  Tired, decreased energy 0  0 0 0  Change in appetite 0  0 0 0  Feeling bad or failure about yourself  0  0 0 0  Trouble concentrating 0  0 0 0  Moving slowly or fidgety/restless 0  0 0 0  Suicidal thoughts 0  0 0 0  PHQ-9 Score 0  3 0 0  Difficult doing work/chores    Not difficult at all     No results found for this or any previous visit (from the past 2160 hour(s)).  Assessment & Plan:    1. Encounter for Medicare annual wellness exam   Exercise Activities and Dietary recommendations  Goals      DIET - INCREASE WATER INTAKE     Recommend to drink at least 6-8 8oz glasses of water per day.     Weight (lb) < 170 lb (77.1 kg)     Pt sates she would like to lose weight with healthy eating and physical activity        - Discussed health benefits of physical activity, and encouraged her to engage in regular exercise appropriate for her age and condition.   Immunization History  Administered Date(s) Administered   Influenza, High Dose Seasonal PF 09/15/2016, 07/07/2017   Influenza-Unspecified 08/17/2014   Janssen (J&J) SARS-COV-2 Vaccination 08/07/2020   Pneumococcal Conjugate-13 12/05/2014   Pneumococcal Polysaccharide-23 08/16/2007, 02/02/2013   Tdap 02/23/2008, 05/25/2022   Zoster, Live 04/04/2008    Health Maintenance  Topic Date Due   Zoster  Vaccines- Shingrix (1 of 2) 05/28/2023 (Originally 09/18/1995)   COVID-19 Vaccine (2 - 2023-24 season) 06/09/2023 (Originally 07/03/2022)   Hepatitis C Screening  08/25/2029 (Originally 09/18/1964)   INFLUENZA VACCINE  06/03/2023   MAMMOGRAM  10/22/2023   Medicare Annual Wellness (AWV)  05/23/2024   DTaP/Tdap/Td (3 - Td or Tdap) 05/25/2032   Pneumonia Vaccine 35+ Years old  Completed   DEXA SCAN  Completed   HPV VACCINES  Aged Out   Colonoscopy  Discontinued    No orders of the defined types were placed in this encounter.   Current Outpatient Medications:    acetaminophen (TYLENOL) 650 MG CR tablet, Take 650 mg by mouth daily as needed for pain., Disp: , Rfl:    ALPRAZolam (XANAX) 0.5 MG tablet, Take 0.5 mg by mouth at bedtime as needed for anxiety., Disp: , Rfl:    Ascorbic Acid (VITAMIN C WITH ROSE HIPS) 1000 MG tablet, Take 1,000 mg by mouth daily. With Zinc, Disp: , Rfl:    aspirin EC 81 MG tablet, Take 81 mg by mouth daily. Swallow whole., Disp: , Rfl:    baclofen (LIORESAL) 10 MG tablet, Take 1 tablet (10 mg total) by mouth at bedtime as needed for muscle spasms., Disp: 90 tablet, Rfl: 1   Cholecalciferol (VITAMIN D3) 1000 UNITS CAPS, Take 1,000 Units by mouth every morning. , Disp: , Rfl:    denosumab (PROLIA) 60 MG/ML SOSY injection, Inject 60 mg into the skin every 6 (six) months., Disp: , Rfl:    EPINEPHrine (EPI-PEN) 0.3 mg/0.3 mL DEVI, Inject 0.3 mg into the muscle as needed (anaphylaxis)., Disp: , Rfl:    fluticasone (FLONASE) 50 MCG/ACT nasal spray, Place 2 sprays into both nostrils daily., Disp: 48 g, Rfl: 1   Multiple Vitamins-Minerals (CENTRUM SILVER PO), Take 1 tablet by mouth every morning. , Disp: , Rfl:    nitroGLYCERIN (NITROSTAT) 0.4 MG SL tablet, Place 1 tablet (0.4 mg total) under the tongue every 5 (five) minutes as needed for chest pain., Disp: 25 tablet, Rfl: 0   Olmesartan-amLODIPine-HCTZ 40-10-12.5 MG TABS, Take 1 tablet by mouth once daily, Disp: 90 tablet,  Rfl: 1   omeprazole (PRILOSEC) 40 MG capsule, Take 1 capsule (40 mg total) by mouth daily. New dose, Disp: 90 capsule, Rfl: 0   polyethylene glycol (MIRALAX / GLYCOLAX) 17 g packet, Take 17 g by mouth daily., Disp: 14 each, Rfl: 0   traZODone (DESYREL) 50 MG tablet, Take 1 tablet (50 mg total) by mouth at bedtime as needed for sleep., Disp: 90 tablet, Rfl: 0 There are no discontinued medications.  I have personally reviewed and addressed the Medicare Annual Wellness health risk assessment questionnaire and have noted the following in the patient's chart:  A.         Medical and social history & family history B.         Use of alcohol, tobacco, and illicit drugs  C.         Current medications and supplements D.         Functional and Cognitive ability and status E.         Nutritional status F.         Physical activity G.        Advance directives H.         List of other physicians I.          Hospitalizations, surgeries, and ER visits in previous 12 months J.         Vitals K.         Screenings such as hearing, vision, cognitive function, and depression L.         Referrals and appointments:   In addition, I have reviewed and discussed with patient certain preventive protocols, quality metrics, and best practice recommendations. A written personalized care plan for preventive services as well as general preventive health recommendations were provided to patient.   See attached scanned questionnaire for additional information.   Return in about 1 year (around 05/23/2024) for medicare wellness with LPN.

## 2023-05-23 ENCOUNTER — Other Ambulatory Visit: Payer: Self-pay | Admitting: Family Medicine

## 2023-05-24 ENCOUNTER — Ambulatory Visit (INDEPENDENT_AMBULATORY_CARE_PROVIDER_SITE_OTHER): Payer: Medicare PPO | Admitting: Family Medicine

## 2023-05-24 ENCOUNTER — Encounter: Payer: Self-pay | Admitting: Family Medicine

## 2023-05-24 VITALS — BP 120/62 | HR 76 | Temp 97.8°F | Resp 14 | Ht 62.5 in | Wt 182.0 lb

## 2023-05-24 DIAGNOSIS — Z0000 Encounter for general adult medical examination without abnormal findings: Secondary | ICD-10-CM

## 2023-05-31 ENCOUNTER — Ambulatory Visit: Payer: Medicare PPO | Admitting: Family Medicine

## 2023-06-08 NOTE — Progress Notes (Unsigned)
Name: Jodi Kirby   MRN: [id number]    DOB: [birth year]   Date:06/09/2023       Progress Note  Subjective  Chief Complaint  Follow up  HPI  OSA: she usually wears her CPAP every night, she is able to keep it on for at least 3 hours but usually close to 6 hours  She is back on Trazodone to help her stay asleep, she denies side effects at this time.   Chronic Insomnia : she is doing well on Trazodone able to sleep longer and keep CPAP machine on   DDD lumbar spine:  History of spinal fusion and L3-4 .and revision in Feb 2022 She states back pain is intermittent, she feels sore and stiff , she recently went to [place] and back pain got worse but is feeling to baseline now. Pain 3/10 , radiculitis down right leg is seldom, takes prn baclofen muscle spasms    IMPRESSION: 10/09/2020  1. Mildly progressive disc degeneration at L2-3 with moderate spinal stenosis and mild-to-moderate left greater than right neural foraminal and lateral recess stenosis. 2. Postoperative changes at L3-4 and L4-5 with unchanged residual spinal and neural foraminal stenosis at L3-4.   HTN with CKI stage III : she is back on one pill Tribenzor  no recent episodes of chest pain or palpitation, denies orthopnea or dizziness. Marland KitchenGFR was slightly lower during recent [facility] visit and we will recheck labs today.   She  denies pruritis, states good urine output . Reminded her to stay hydrated    Hyperlipidemia/aorta atherosclerosis/Unstable Angina : unable to tolerate statins because it causes myopathy Dr. Mariah Milling started her on Zetia 07/2018 and initially she was tolerating it but cramps increased and medication was stopped end of 2023  We tried Vascepa but too costly, also unable to afford PCSK9 . She is under the care of Dr. Mariah Milling and has CAD , takes aspirin but unable to take betablocker due to bradycardia   Anemia: normal iron storage,  she is taking B12 and we will recheck labs    Osteoporosis : diagnosed in 2016 - and  was started on Fosamax, previous bone density in 2018, no side effects of medication. -2.4 spine -2.2 of femur , she had repeat study at [facility] 10/2020 and showed T score spine of -2.8 and femur -2.1 . She is seeing Dr. Gershon Crane at [facility], had SPEP, Pth and vitamin D done, first infusion of Prolia was  Spring 2024 Last bone density March 2024 and improved, now osteopenia instead of osteoporosis    Hyperglycemia: she denies polyphagia, polydipsia or polyuria. A1C was 5.5% to 5.7% A1C 5.4 % and 5.6 % ,she tries to follow a low sugar diet  Unchanged    Mild cognitive dysfunction: CIT was 8 in 2019, down to 4 in 2020 and last checked in 2021 was down to 2, her MMS 05/2022 was normal at 30, also normal CIT in 2024  . She has been doing  cross work puzzles, Armed forces logistics/support/administrative officer. She still drives, take medications on her own, pays her bills, groceries shopping .   GERD: she had EGD in 2023 that showed hiatal hernia, gastritis and was doing well on Omeprazole 20 mg, but  heartburn increased Spring 2024 and we adjusted dose to 40 mg , symptoms are controlled    Patient Active Problem List   Diagnosis Date Noted   Statin myopathy 06/09/2023   Muscle spasm 06/09/2023   Vitamin D deficiency 04/27/2022   Stage 3a chronic  kidney disease (HCC) 04/27/2022   Vitamin B12 deficiency 12/29/2021   Anemia of chronic disease 11/19/2021   Lumbar stenosis 12/16/2020   Benign hypertension with chronic kidney disease, stage III (HCC) 12/22/2018   History of lumbar fusion 07/19/2018   DDD (degenerative disc disease), lumbosacral 07/19/2018   Obesity (BMI 30.0-34.9) 07/19/2018   Mild cognitive impairment 12/21/2017   Aortic atherosclerosis (HCC) 05/15/2017   Impingement syndrome of left shoulder 05/12/2016   Chronic insomnia 11/08/2015   Angina pectoris (HCC) 07/10/2015   Seasonal allergic rhinitis 07/09/2015   Gastroesophageal reflux disease without esophagitis 07/09/2015   Bee sting allergy 07/09/2015   History of  knee replacement procedure of right knee 04/29/2015   History of colonic polyps 09/24/2014   Fibrocystic breast 09/24/2014   Dyslipidemia 02/09/2013   Atherosclerosis of native coronary artery of native heart with stable angina pectoris (HCC) 02/09/2013   Obstructive sleep apnea on CPAP 02/09/2013   Hypertension 02/09/2013    Past Surgical History:  Procedure Laterality Date   ABDOMINAL HYSTERECTOMY  1970   ANKLE SURGERY Right 1980   ANTERIOR LATERAL LUMBAR FUSION WITH PERCUTANEOUS SCREW 2 LEVEL N/A 12/16/2020   Procedure: L2/3, L3/4 LATERAL LUMBAR FUSION, L2-4 PEDICLE SCREW FIXATION;  Surgeon: Lucy Chris, MD;  Location: [facility] ORS;  Service: Neurosurgery;  Laterality: N/A;   BLADDER SURGERY  2012   CARDIAC CATHETERIZATION  2003   [place]   CARDIAC CATHETERIZATION     [place]   CATARACT EXTRACTION W/PHACO Right 02/09/2018   Procedure: CATARACT EXTRACTION PHACO AND INTRAOCULAR LENS PLACEMENT (IOC) RIGHT;  Surgeon: Lockie Mola, MD;  Location: [facility];  Service: Ophthalmology;  Laterality: Right;  sleep apnea   CATARACT EXTRACTION W/PHACO Left 03/09/2018   Procedure: CATARACT EXTRACTION PHACO AND INTRAOCULAR LENS PLACEMENT (IOC);  Surgeon: Lockie Mola, MD;  Location: [facility];  Service: Ophthalmology;  Laterality: Left;  IVA TOPICALLEFT   COLONOSCOPY  2008, 2015   Dr. Servando Snare   COLONOSCOPY N/A 03/05/2022   Procedure: COLONOSCOPY;  Surgeon: Jaynie Collins, DO;  Location: [facility] ENDOSCOPY;  Service: Gastroenterology;  Laterality: N/A;   CYSTOSTOMY     ESOPHAGOGASTRODUODENOSCOPY N/A 03/05/2022   Procedure: ESOPHAGOGASTRODUODENOSCOPY (EGD);  Surgeon: Jaynie Collins, DO;  Location: [facility] ENDOSCOPY;  Service: Gastroenterology;  Laterality: N/A;   KNEE SURGERY  08/18/2011   arthroscopic right   LUMBAR FUSION     L4-5, S-1   NECK SURGERY  2003   TOTAL KNEE ARTHROPLASTY Right 04/29/2015   Procedure: TOTAL KNEE ARTHROPLASTY;  Surgeon: Erin Sons,  MD;  Location: [facility] ORS;  Service: Orthopedics;  Laterality: Right;   TUBAL LIGATION      Family History  Problem Relation Age of Onset   Kidney disease Mother    Hypertension Mother    CAD Father    Healthy Sister    Kidney disease Son    Kidney cancer Neg Hx    Bladder Cancer Neg Hx     Social History   Tobacco Use   Smoking status: Never   Smokeless tobacco: Never   Tobacco comments:    smoking cessation materials not required  Substance Use Topics   Alcohol use: No    Alcohol/week: 0.0 standard drinks of alcohol     Current Outpatient Medications:    acetaminophen (TYLENOL) 650 MG CR tablet, Take 650 mg by mouth daily as needed for pain., Disp: , Rfl:    Ascorbic Acid (VITAMIN C WITH ROSE HIPS) 1000 MG tablet, Take 1,000 mg by mouth daily. With  Zinc, Disp: , Rfl:    aspirin EC 81 MG tablet, Take 81 mg by mouth daily. Swallow whole., Disp: , Rfl:    baclofen (LIORESAL) 10 MG tablet, Take 1 tablet (10 mg total) by mouth at bedtime as needed for muscle spasms., Disp: 90 tablet, Rfl: 1   Cholecalciferol (VITAMIN D3) 1000 UNITS CAPS, Take 1,000 Units by mouth every morning. , Disp: , Rfl:    denosumab (PROLIA) 60 MG/ML SOSY injection, Inject 60 mg into the skin every 6 (six) months., Disp: , Rfl:    EPINEPHrine (EPI-PEN) 0.3 mg/0.3 mL DEVI, Inject 0.3 mg into the muscle as needed (anaphylaxis)., Disp: , Rfl:    fluticasone (FLONASE) 50 MCG/ACT nasal spray, Place 2 sprays into both nostrils daily., Disp: 48 g, Rfl: 1   Multiple Vitamins-Minerals (CENTRUM SILVER PO), Take 1 tablet by mouth every morning. , Disp: , Rfl:    nitroGLYCERIN (NITROSTAT) 0.4 MG SL tablet, Place 1 tablet (0.4 mg total) under the tongue every 5 (five) minutes as needed for chest pain., Disp: 25 tablet, Rfl: 0   Olmesartan-amLODIPine-HCTZ 40-10-12.5 MG TABS, Take 1 tablet by mouth once daily, Disp: 90 tablet, Rfl: 1   omeprazole (PRILOSEC) 40 MG capsule, Take 1 capsule (40 mg total) by mouth daily. New  dose, Disp: 90 capsule, Rfl: 0   polyethylene glycol (MIRALAX / GLYCOLAX) 17 g packet, Take 17 g by mouth daily., Disp: 14 each, Rfl: 0   traZODone (DESYREL) 50 MG tablet, Take 1 tablet (50 mg total) by mouth at bedtime as needed for sleep., Disp: 90 tablet, Rfl: 0  Allergies  Allergen Reactions   Niaspan [Niacin Er] Other (See Comments)    Patient states she can't remember the reaction because it so long ago.   Oxycodone-Acetaminophen Nausea Only   Statins Other (See Comments)    Muscle and joint pain.    I personally reviewed active problem list, medication list, allergies, family history with the patient/caregiver today.   ROS  Constitutional: Negative for fever or weight change.  Respiratory: Negative for cough and shortness of breath.   Cardiovascular: Negative for chest pain or palpitations.  Gastrointestinal: Negative for abdominal pain, no bowel changes.  Musculoskeletal: Negative for gait problem or joint swelling.  Skin: Negative for rash.  Neurological: Negative for dizziness or headache.  No other specific complaints in a complete review of systems (except as listed in HPI above).   Objective  Vitals:   06/09/23 0920  BP: 122/70  Pulse: 89  Resp: 14  Temp: 98.2 F (36.8 C)  TempSrc: Oral  SpO2: 100%  Weight: 182 lb (82.6 kg)  Height: 5\' 2"  (1.575 m)    Body mass index is 33.29 kg/m.  Physical Exam  Constitutional: Patient appears well-developed and well-nourished. Obese  No distress.  HEENT: head atraumatic, normocephalic, pupils equal and reactive to light, neck supple Cardiovascular: Normal rate, regular rhythm and normal heart sounds.  No murmur heard. No BLE edema. Pulmonary/Chest: Effort normal and breath sounds normal. No respiratory distress. Abdominal: Soft.  There is no tenderness. Psychiatric: Patient has a normal mood and affect. behavior is normal. Judgment and thought content normal.   PHQ2/9:    06/09/2023    9:22 AM 05/24/2023    9:59  AM 05/24/2023    9:54 AM 03/01/2023    9:25 AM 01/04/2023    9:15 AM  Depression screen PHQ 2/9  Decreased Interest 0 0 0 0 0  Down, Depressed, Hopeless 0 0 0 0 0  PHQ - 2 Score 0 0 0 0 0  Altered sleeping 0 0  3 0  Tired, decreased energy 0 0  0 0  Change in appetite 0 0  0 0  Feeling bad or failure about yourself  0 0  0 0  Trouble concentrating 0 0  0 0  Moving slowly or fidgety/restless 0 0  0 0  Suicidal thoughts 0 0  0 0  PHQ-9 Score 0 0  3 0  Difficult doing work/chores     Not difficult at all    phq 9 is negative   Fall Risk:    06/09/2023    9:21 AM 05/24/2023    9:59 AM 03/01/2023    9:25 AM 01/04/2023    9:15 AM 08/28/2022    9:28 AM  Fall Risk   Falls in the past year? 0 0 0 0 0  Number falls in past yr:   0 0   Injury with Fall?   0 0   Risk for fall due to : No Fall Risks No Fall Risks No Fall Risks No Fall Risks No Fall Risks  Follow up Falls prevention discussed Falls prevention discussed;Education provided;Falls evaluation completed Falls prevention discussed Falls prevention discussed;Education provided;Falls evaluation completed Falls prevention discussed;Education provided;Falls evaluation completed     Functional Status Survey: Is the patient deaf or have difficulty hearing?: No Does the patient have difficulty seeing, even when wearing glasses/contacts?: No Does the patient have difficulty concentrating, remembering, or making decisions?: No Does the patient have difficulty walking or climbing stairs?: No Does the patient have difficulty dressing or bathing?: No Does the patient have difficulty doing errands alone such as visiting a doctor's office or shopping?: No    Assessment & Plan  1. Aortic atherosclerosis (HCC)  - Lipid panel  2. Stage 3a chronic kidney disease (HCC)  - CBC with Differential/Platelet - COMPLETE METABOLIC PANEL WITH GFR  3. Angina pectoris (HCC)  No recent problems and on medical management   4. Essential  hypertension  BP is at goal   5. Vitamin B12 deficiency  - B12 and Folate Panel  6. Obstructive sleep apnea on CPAP   7. Gastroesophageal reflux disease without esophagitis  - omeprazole (PRILOSEC) 40 MG capsule; Take 1 capsule (40 mg total) by mouth daily.  Dispense: 90 capsule; Refill: 1  8. Statin myopathy   9. Vitamin D deficiency  - VITAMIN D 25 Hydroxy (Vit-D Deficiency, Fractures)  10. Chronic insomnia  Taking Trazodone prn and doing well   11. DDD (degenerative disc disease), lumbosacral  - baclofen (LIORESAL) 10 MG tablet; Take 1 tablet (10 mg total) by mouth at bedtime as needed for muscle spasms.  Dispense: 90 tablet; Refill: 1  12. Muscle spasm  - baclofen (LIORESAL) 10 MG tablet; Take 1 tablet (10 mg total) by mouth at bedtime as needed for muscle spasms.  Dispense: 90 tablet; Refill: 1

## 2023-06-09 ENCOUNTER — Ambulatory Visit (INDEPENDENT_AMBULATORY_CARE_PROVIDER_SITE_OTHER): Payer: Medicare PPO | Admitting: Family Medicine

## 2023-06-09 ENCOUNTER — Encounter: Payer: Self-pay | Admitting: Family Medicine

## 2023-06-09 VITALS — BP 122/70 | HR 89 | Temp 98.2°F | Resp 14 | Ht 62.0 in | Wt 182.0 lb

## 2023-06-09 DIAGNOSIS — I209 Angina pectoris, unspecified: Secondary | ICD-10-CM

## 2023-06-09 DIAGNOSIS — G720 Drug-induced myopathy: Secondary | ICD-10-CM | POA: Diagnosis not present

## 2023-06-09 DIAGNOSIS — E538 Deficiency of other specified B group vitamins: Secondary | ICD-10-CM | POA: Diagnosis not present

## 2023-06-09 DIAGNOSIS — I700 Atherosclerosis of aorta: Secondary | ICD-10-CM

## 2023-06-09 DIAGNOSIS — K219 Gastro-esophageal reflux disease without esophagitis: Secondary | ICD-10-CM | POA: Diagnosis not present

## 2023-06-09 DIAGNOSIS — N1831 Chronic kidney disease, stage 3a: Secondary | ICD-10-CM | POA: Diagnosis not present

## 2023-06-09 DIAGNOSIS — E559 Vitamin D deficiency, unspecified: Secondary | ICD-10-CM

## 2023-06-09 DIAGNOSIS — M62838 Other muscle spasm: Secondary | ICD-10-CM | POA: Insufficient documentation

## 2023-06-09 DIAGNOSIS — M5137 Other intervertebral disc degeneration, lumbosacral region: Secondary | ICD-10-CM

## 2023-06-09 DIAGNOSIS — F5104 Psychophysiologic insomnia: Secondary | ICD-10-CM

## 2023-06-09 DIAGNOSIS — I10 Essential (primary) hypertension: Secondary | ICD-10-CM

## 2023-06-09 DIAGNOSIS — T466X5A Adverse effect of antihyperlipidemic and antiarteriosclerotic drugs, initial encounter: Secondary | ICD-10-CM

## 2023-06-09 DIAGNOSIS — G4733 Obstructive sleep apnea (adult) (pediatric): Secondary | ICD-10-CM

## 2023-06-09 LAB — CBC WITH DIFFERENTIAL/PLATELET
Absolute Monocytes: 490 cells/uL (ref 200–950)
Basophils Absolute: 28 cells/uL (ref 0–200)
Basophils Relative: 0.4 %
Eosinophils Absolute: 91 cells/uL (ref 15–500)
Eosinophils Relative: 1.3 %
HCT: 31.3 % — ABNORMAL LOW (ref 35.0–45.0)
Hemoglobin: 10.3 g/dL — ABNORMAL LOW (ref 11.7–15.5)
Lymphs Abs: 1624 cells/uL (ref 850–3900)
MCH: 27.9 pg (ref 27.0–33.0)
MCHC: 32.9 g/dL (ref 32.0–36.0)
MCV: 84.8 fL (ref 80.0–100.0)
MPV: 11 fL (ref 7.5–12.5)
Monocytes Relative: 7 %
Neutro Abs: 4767 cells/uL (ref 1500–7800)
Neutrophils Relative %: 68.1 %
Platelets: 286 10*3/uL (ref 140–400)
RBC: 3.69 10*6/uL — ABNORMAL LOW (ref 3.80–5.10)
RDW: 13 % (ref 11.0–15.0)
Total Lymphocyte: 23.2 %
WBC: 7 10*3/uL (ref 3.8–10.8)

## 2023-06-09 LAB — LIPID PANEL
Cholesterol: 224 mg/dL — ABNORMAL HIGH (ref <200)
HDL: 46 mg/dL — ABNORMAL LOW (ref >=50)
LDL Cholesterol (Calc): 139 mg/dL (calc) — ABNORMAL HIGH
Non-HDL Cholesterol (Calc): 178 mg/dL (calc) — ABNORMAL HIGH (ref <130)
Total CHOL/HDL Ratio: 4.9 (calc) (ref <5.0)
Triglycerides: 254 mg/dL — ABNORMAL HIGH (ref <150)

## 2023-06-09 LAB — COMPLETE METABOLIC PANEL WITH GFR
AG Ratio: 1.7 (calc) (ref 1.0–2.5)
ALT: 16 U/L (ref 6–29)
AST: 14 U/L (ref 10–35)
Albumin: 4.3 g/dL (ref 3.6–5.1)
Alkaline phosphatase (APISO): 62 U/L (ref 37–153)
BUN/Creatinine Ratio: 16 (calc) (ref 6–22)
BUN: 22 mg/dL (ref 7–25)
CO2: 27 mmol/L (ref 20–32)
Calcium: 9.5 mg/dL (ref 8.6–10.4)
Chloride: 108 mmol/L (ref 98–110)
Creat: 1.4 mg/dL — ABNORMAL HIGH (ref 0.60–1.00)
Globulin: 2.5 g/dL (calc) (ref 1.9–3.7)
Glucose, Bld: 97 mg/dL (ref 65–99)
Potassium: 4.4 mmol/L (ref 3.5–5.3)
Sodium: 143 mmol/L (ref 135–146)
Total Bilirubin: 0.3 mg/dL (ref 0.2–1.2)
Total Protein: 6.8 g/dL (ref 6.1–8.1)
eGFR: 39 mL/min/{1.73_m2} — ABNORMAL LOW (ref >=60)

## 2023-06-09 MED ORDER — BACLOFEN 10 MG PO TABS: 10.0000 mg | ORAL_TABLET | Freq: Every evening | ORAL | 1 refills | Status: AC | PRN

## 2023-06-09 MED ORDER — OMEPRAZOLE 40 MG PO CPDR: 40.0000 mg | DELAYED_RELEASE_CAPSULE | Freq: Every day | ORAL | 1 refills | Status: DC

## 2023-06-20 DIAGNOSIS — G4733 Obstructive sleep apnea (adult) (pediatric): Secondary | ICD-10-CM | POA: Diagnosis not present

## 2023-07-19 ENCOUNTER — Ambulatory Visit
Admission: RE | Admit: 2023-07-19 | Discharge: 2023-07-19 | Disposition: A | Discharge status: Home / Self Care | Payer: Medicare PPO | Source: Ambulatory Visit | Attending: Nurse Practitioner | Admitting: Nurse Practitioner

## 2023-07-19 ENCOUNTER — Other Ambulatory Visit
Admission: RE | Admit: 2023-07-19 | Discharge: 2023-07-19 | Disposition: A | Discharge status: Home / Self Care | Payer: Medicare PPO | Source: Home / Self Care | Attending: Nurse Practitioner | Admitting: Nurse Practitioner

## 2023-07-19 ENCOUNTER — Ambulatory Visit: Payer: Self-pay | Admitting: *Deleted

## 2023-07-19 ENCOUNTER — Ambulatory Visit (INDEPENDENT_AMBULATORY_CARE_PROVIDER_SITE_OTHER): Payer: Medicare PPO | Admitting: Nurse Practitioner

## 2023-07-19 ENCOUNTER — Encounter: Payer: Self-pay | Admitting: Nurse Practitioner

## 2023-07-19 ENCOUNTER — Other Ambulatory Visit: Payer: Self-pay

## 2023-07-19 VITALS — BP 128/72 | HR 84 | Temp 98.5°F | Resp 16 | Ht 62.5 in | Wt 185.3 lb

## 2023-07-19 DIAGNOSIS — R1032 Left lower quadrant pain: Secondary | ICD-10-CM

## 2023-07-19 DIAGNOSIS — K5730 Diverticulosis of large intestine without perforation or abscess without bleeding: Secondary | ICD-10-CM | POA: Diagnosis not present

## 2023-07-19 LAB — COMPREHENSIVE METABOLIC PANEL
ALT: 17 U/L (ref 0–44)
AST: 15 U/L (ref 15–41)
Albumin: 4.5 g/dL (ref 3.5–5.0)
Alkaline Phosphatase: 74 U/L (ref 38–126)
Anion gap: 10 (ref 5–15)
BUN: 23 mg/dL (ref 8–23)
CO2: 26 mmol/L (ref 22–32)
Calcium: 9.4 mg/dL (ref 8.9–10.3)
Chloride: 103 mmol/L (ref 98–111)
Creatinine, Ser: 1.38 mg/dL — ABNORMAL HIGH (ref 0.44–1.00)
GFR, Estimated: 39 mL/min — ABNORMAL LOW (ref >60)
Glucose, Bld: 113 mg/dL — ABNORMAL HIGH (ref 70–99)
Potassium: 3.7 mmol/L (ref 3.5–5.1)
Sodium: 139 mmol/L (ref 135–145)
Total Bilirubin: 0.6 mg/dL (ref 0.3–1.2)
Total Protein: 7.5 g/dL (ref 6.5–8.1)

## 2023-07-19 LAB — POCT URINALYSIS DIPSTICK
Bilirubin, UA: NEGATIVE
Blood, UA: NEGATIVE
Glucose, UA: NEGATIVE
Ketones, UA: NEGATIVE
Leukocytes, UA: NEGATIVE
Nitrite, UA: NEGATIVE
Protein, UA: NEGATIVE
Spec Grav, UA: 1.01 (ref 1.010–1.025)
Urobilinogen, UA: 0.2 E.U./dL
pH, UA: 5 (ref 5.0–8.0)

## 2023-07-19 LAB — CBC WITH DIFFERENTIAL/PLATELET
Abs Immature Granulocytes: 0.09 10*3/uL — ABNORMAL HIGH (ref 0.00–0.07)
Basophils Absolute: 0 10*3/uL (ref 0.0–0.1)
Basophils Relative: 0 %
Eosinophils Absolute: 0 10*3/uL (ref 0.0–0.5)
Eosinophils Relative: 0 %
HCT: 31.2 % — ABNORMAL LOW (ref 36.0–46.0)
Hemoglobin: 10.7 g/dL — ABNORMAL LOW (ref 12.0–15.0)
Immature Granulocytes: 1 %
Lymphocytes Relative: 23 %
Lymphs Abs: 2.1 10*3/uL (ref 0.7–4.0)
MCH: 28.4 pg (ref 26.0–34.0)
MCHC: 34.3 g/dL (ref 30.0–36.0)
MCV: 82.8 fL (ref 80.0–100.0)
Monocytes Absolute: 0.7 10*3/uL (ref 0.1–1.0)
Monocytes Relative: 7 %
Neutro Abs: 6.1 10*3/uL (ref 1.7–7.7)
Neutrophils Relative %: 69 %
Platelets: 259 10*3/uL (ref 150–400)
RBC: 3.77 MIL/uL — ABNORMAL LOW (ref 3.87–5.11)
RDW: 12.6 % (ref 11.5–15.5)
WBC: 9 10*3/uL (ref 4.0–10.5)
nRBC: 0 % (ref 0.0–0.2)

## 2023-07-19 LAB — LIPASE, BLOOD: Lipase: 35 U/L (ref 11–51)

## 2023-07-19 MED ORDER — IOHEXOL 300 MG/ML  SOLN
100.0000 mL | Freq: Once | INTRAMUSCULAR | Status: AC | PRN
  Administered 2023-07-19: 80 mL via INTRAVENOUS

## 2023-07-19 MED ORDER — IOHEXOL 9 MG/ML PO SOLN
500.0000 mL | ORAL | Status: AC
  Administered 2023-07-19 (×2): 500 mL via ORAL

## 2023-07-19 NOTE — Telephone Encounter (Signed)
  Chief Complaint: left side flank pain  Symptoms: swelling left side with pain level 6-10. Hx diverticulitis constant dull pain  Frequency: approx 1 month  Pertinent Negatives: Patient denies fever no severe pain at this time. Disposition: [] ED /[] Urgent Care (no appt availability in office) / [x] Appointment(In office/virtual)/ []  [facility]/ [] Home Care/ [] Refused Recommended Disposition /[] [facility]/ []  Follow-up with PCP Additional Notes:   Appt scheduled today      Reason for Disposition  MODERATE pain (e.g., interferes with normal activities or awakens from sleep)  Answer Assessment - Initial Assessment Questions 1. LOCATION: "Where does it hurt?" (e.g., left, right)     Left side  2. ONSET: "When did the pain start?"     Approx 1  month but worse now  3. SEVERITY: "How bad is the pain?" (e.g., Scale 1-10; mild, moderate, or severe)   - MILD (1-3): doesn't interfere with normal activities    - MODERATE (4-7): interferes with normal activities or awakens from sleep    - SEVERE (8-10): excruciating pain and patient unable to do normal activities (stays in bed)       Pain 6-10 4. PATTERN: "Does the pain come and go, or is it constant?"      Constant dull pain 5. CAUSE: "What do you think is causing the pain?"     *No Answer* 6. OTHER SYMPTOMS:  "Do you have any other symptoms?" (e.g., fever, abdomen pain, vomiting, leg weakness, burning with urination, blood in urine)     Left side pain  7. PREGNANCY:  "Is there any chance you are pregnant?" "When was your last menstrual period?"     na  Protocols used: Flank Pain-A-AH

## 2023-07-19 NOTE — Progress Notes (Signed)
BP 128/72   Pulse 84   Temp 98.5 F (36.9 C) (Oral)   Resp 16   Ht 5' 2.5" (1.588 m)   Wt 185 lb 4.8 oz (84.1 kg)   SpO2 99%   BMI 33.35 kg/m    Subjective:    Patient ID: Jodi Kirby, female    DOB: [birth year], 77 y.o.   MRN: [id number]  HPI: Jodi Kirby is a 77 y.o. female  Chief Complaint  Patient presents with   Flank Pain    Left side, swollen, painful for 1 month    Abdominal pain: patient reports she has had left side pain for about a month. She reports it has gotten increasingly worse.  She says nothing makes the pain better or worse. She denies any constipation or diarrhea,  no nausea or vomiting.  She says it feel swollen.  Pain is constant in the LUQ, slight radiation to the flank.  She reports the pain is dull and then she will get a sharp pain in the LUQ.  She denies any fever or urinary complaints. She denies any recent illness.  She denies any trauma.  Will get urine, labs and ct scan.  Urine dip was negative.    Relevant past medical, surgical, family and social history reviewed and updated as indicated. Interim medical history since our last visit reviewed. Allergies and medications reviewed and updated.  Review of Systems  Constitutional: Negative for fever or weight change.  Respiratory: Negative for cough and shortness of breath.   Cardiovascular: Negative for chest pain or palpitations.  Gastrointestinal: positive for abdominal pain, no bowel changes.  Musculoskeletal: Negative for gait problem or joint swelling.  Skin: Negative for rash.  Neurological: Negative for dizziness or headache.  No other specific complaints in a complete review of systems (except as listed in HPI above).      Objective:    BP 128/72   Pulse 84   Temp 98.5 F (36.9 C) (Oral)   Resp 16   Ht 5' 2.5" (1.588 m)   Wt 185 lb 4.8 oz (84.1 kg)   SpO2 99%   BMI 33.35 kg/m   Wt Readings from Last 3 Encounters:  07/19/23 185 lb 4.8 oz (84.1 kg)  06/09/23 182  lb (82.6 kg)  05/24/23 182 lb (82.6 kg)    Physical Exam  Constitutional: Patient appears well-developed and well-nourished. Obese  No distress.  HEENT: head atraumatic, normocephalic, pupils equal and reactive to light, neck supple Cardiovascular: Normal rate, regular rhythm and normal heart sounds.  No murmur heard. No BLE edema. Pulmonary/Chest: Effort normal and breath sounds normal. No respiratory distress. Abdominal: Soft.  Tenderness LUQ and LLQ Psychiatric: Patient has a normal mood and affect. behavior is normal. Judgment and thought content normal.   Results for orders placed or performed in visit on 07/19/23  POCT urinalysis dipstick  Result Value Ref Range   Color, UA yellow    Clarity, UA clear    Glucose, UA Negative Negative   Bilirubin, UA negative    Ketones, UA negative    Spec Grav, UA 1.010 1.010 - 1.025   Blood, UA negative    pH, UA 5.0 5.0 - 8.0   Protein, UA Negative Negative   Urobilinogen, UA 0.2 0.2 or 1.0 E.U./dL   Nitrite, UA negative    Leukocytes, UA Negative Negative   Appearance clear    Odor none       Assessment & Plan:   Problem List  Items Addressed This Visit   None Visit Diagnoses     Abdominal pain, LLQ    -  Primary   ct abd pelvis, labs and ua   Relevant Orders   CT ABDOMEN PELVIS W CONTRAST   CBC with Differential/Platelet   Lipase, blood   Comprehensive metabolic panel   POCT urinalysis dipstick (Completed)        Follow up plan: No follow-ups on file.

## 2023-07-21 DIAGNOSIS — G4733 Obstructive sleep apnea (adult) (pediatric): Secondary | ICD-10-CM | POA: Diagnosis not present

## 2023-08-01 ENCOUNTER — Emergency Department: Payer: Medicare PPO

## 2023-08-01 ENCOUNTER — Other Ambulatory Visit: Payer: Self-pay

## 2023-08-01 ENCOUNTER — Emergency Department
Admission: EM | Admit: 2023-08-01 | Discharge: 2023-08-01 | Disposition: A | Discharge status: Home / Self Care | Payer: Medicare PPO | Attending: Emergency Medicine | Admitting: Emergency Medicine

## 2023-08-01 DIAGNOSIS — I129 Hypertensive chronic kidney disease with stage 1 through stage 4 chronic kidney disease, or unspecified chronic kidney disease: Secondary | ICD-10-CM | POA: Diagnosis not present

## 2023-08-01 DIAGNOSIS — R109 Unspecified abdominal pain: Secondary | ICD-10-CM | POA: Diagnosis present

## 2023-08-01 DIAGNOSIS — K5730 Diverticulosis of large intestine without perforation or abscess without bleeding: Secondary | ICD-10-CM | POA: Diagnosis not present

## 2023-08-01 DIAGNOSIS — R1032 Left lower quadrant pain: Secondary | ICD-10-CM | POA: Insufficient documentation

## 2023-08-01 DIAGNOSIS — N189 Chronic kidney disease, unspecified: Secondary | ICD-10-CM | POA: Diagnosis not present

## 2023-08-01 LAB — URINALYSIS, ROUTINE W REFLEX MICROSCOPIC
Bilirubin Urine: NEGATIVE
Glucose, UA: NEGATIVE mg/dL
Hgb urine dipstick: NEGATIVE
Ketones, ur: NEGATIVE mg/dL
Leukocytes,Ua: NEGATIVE
Nitrite: NEGATIVE
Protein, ur: NEGATIVE mg/dL
Specific Gravity, Urine: 1.003 — ABNORMAL LOW (ref 1.005–1.030)
pH: 5 (ref 5.0–8.0)

## 2023-08-01 LAB — BASIC METABOLIC PANEL
Anion gap: 8 (ref 5–15)
BUN: 24 mg/dL — ABNORMAL HIGH (ref 8–23)
CO2: 26 mmol/L (ref 22–32)
Calcium: 9.4 mg/dL (ref 8.9–10.3)
Chloride: 104 mmol/L (ref 98–111)
Creatinine, Ser: 1.42 mg/dL — ABNORMAL HIGH (ref 0.44–1.00)
GFR, Estimated: 38 mL/min — ABNORMAL LOW (ref >60)
Glucose, Bld: 108 mg/dL — ABNORMAL HIGH (ref 70–99)
Potassium: 3.8 mmol/L (ref 3.5–5.1)
Sodium: 138 mmol/L (ref 135–145)

## 2023-08-01 LAB — CBC
HCT: 31.7 % — ABNORMAL LOW (ref 36.0–46.0)
Hemoglobin: 10.6 g/dL — ABNORMAL LOW (ref 12.0–15.0)
MCH: 27.3 pg (ref 26.0–34.0)
MCHC: 33.4 g/dL (ref 30.0–36.0)
MCV: 81.7 fL (ref 80.0–100.0)
Platelets: 279 10*3/uL (ref 150–400)
RBC: 3.88 MIL/uL (ref 3.87–5.11)
RDW: 12.5 % (ref 11.5–15.5)
WBC: 8 10*3/uL (ref 4.0–10.5)
nRBC: 0 % (ref 0.0–0.2)

## 2023-08-01 MED ORDER — IOHEXOL 300 MG/ML  SOLN
75.0000 mL | Freq: Once | INTRAMUSCULAR | Status: AC | PRN
  Administered 2023-08-01: 75 mL via INTRAVENOUS

## 2023-08-01 NOTE — ED Triage Notes (Signed)
Pt comes with c/o left flank pain that started today. Pt states this started today at church. Pt states some burning with urination.

## 2023-08-01 NOTE — ED Notes (Signed)
Patient describing awful pain in her LEFT side.

## 2023-08-01 NOTE — Discharge Instructions (Signed)
Your urinalysis, lab workup, and CT scan are all reassuring.  You may be having nerve pain rating from your back, pain related to the scar tissue of your back surgery, or mild inflammation in your intestines that is not showing up on the CT.  Follow-up with your primary care provider.  Return to the ER for new, worsening, or persistent severe abdominal or flank pain, vomiting, fever, blood in the stool, weakness, or any other new or worsening symptoms that concern you.

## 2023-08-01 NOTE — ED Provider Notes (Signed)
[facility] Provider Note    Event Date/Time   First MD Initiated Contact with Patient 08/01/23 1137     (approximate)   History   Flank Pain   HPI  Jodi Kirby is a 77 y.o. female with a history of hypertension, OSA on CPAP, degenerative disc disease, CKD, and anemia who presents with left mid abdominal and flank pain for the last 1 to 2 months, acutely worsened over the last day, nonradiating.  It is associated with some burning on urination.  The patient denies any rash or skin lesions.  She has no nausea or vomiting.  She denies any diarrhea or change in her bowel movements.  She has no fever or chills.  I reviewed the past medical records.  The patient's most recent outpatient encounter was with family medicine on 9/16 for left flank pain which had been present for 1 month at that time.  CT was obtained at that time which was negative for acute findings.   Physical Exam   Triage Vital Signs: ED Triage Vitals  Encounter Vitals Group     BP 08/01/23 1113 (!) 164/119     Systolic BP Percentile --      Diastolic BP Percentile --      Pulse Rate 08/01/23 1113 81     Resp 08/01/23 1113 17     Temp 08/01/23 1113 98.8 F (37.1 C)     Temp src --      SpO2 08/01/23 1113 100 %     Weight --      Height --      Head Circumference --      Peak Flow --      Pain Score 08/01/23 1112 10     Pain Loc --      Pain Education --      Exclude from Growth Chart --     Most recent vital signs: Vitals:   08/01/23 1315 08/01/23 1330  BP:  123/64  Pulse: (!) 52 (!) 55  Resp:    Temp:    SpO2: 100% 98%     General: Awake, no distress.  CV:  Good peripheral perfusion.  Resp:  Normal effort.  Abd:  Soft with mild left mid and lower abdominal tenderness.  No distention.  Other:  No jaundice or scleral icterus.  No abdominal skin lesions or rash.   ED Results / Procedures / Treatments   Labs (all labs ordered are listed, but only abnormal  results are displayed) Labs Reviewed  URINALYSIS, ROUTINE W REFLEX MICROSCOPIC - Abnormal; Notable for the following components:      Result Value   Color, Urine STRAW (*)    APPearance HAZY (*)    Specific Gravity, Urine 1.003 (*)    All other components within normal limits  CBC - Abnormal; Notable for the following components:   Hemoglobin 10.6 (*)    HCT 31.7 (*)    All other components within normal limits  BASIC METABOLIC PANEL - Abnormal; Notable for the following components:   Glucose, Bld 108 (*)    BUN 24 (*)    Creatinine, Ser 1.42 (*)    GFR, Estimated 38 (*)    All other components within normal limits     EKG     RADIOLOGY  CT abdomen/pelvis: I independently viewed and interpreted the images; there are no dilated bowel loops or any free air or free fluid.  Radiology report indicates no acute abnormality.  PROCEDURES:  Critical Care performed: No  Procedures   MEDICATIONS ORDERED IN ED: Medications  iohexol (OMNIPAQUE) 300 MG/ML solution 75 mL (75 mLs Intravenous Contrast Given 08/01/23 1204)     IMPRESSION / MDM / ASSESSMENT AND PLAN / ED COURSE  I reviewed the triage vital signs and the nursing notes.  77 year old female with PMH as noted above presents with subacute left sided abdominal and flank pain, however it acutely worsened over the last day.  Associated with some dysuria.  On exam the patient is hypertensive with otherwise normal vital signs.  She does have localized tenderness.  There is no rash.  Differential diagnosis includes, but is not limited to, diverticulitis, colitis, ureteral stone, UTI/pyelonephritis, musculoskeletal pain.  There is no clinical evidence for shingles.  We will obtain lab workup, urinalysis, and a repeat CT.  Patient's presentation is most consistent with acute complicated illness / injury requiring diagnostic workup.  ----------------------------------------- 1:48 PM on  08/01/2023 -----------------------------------------  Urinalysis shows no acute findings.  Basic labs are reassuring with no leukocytosis or anemia on the CBC.  It is consistent with the patient's baseline.  CT shows no acute finding.  Differential includes radicular pain or other neuropathic etiology, musculoskeletal pain, or possible mild intestinal inflammation that is not showing up on the CT.  The patient has not required any pain medication in the ED.  She feels comfortable going home continue to use over-the-counter medicine.  She will follow-up with her primary care provider.  She is stable for discharge at this time.  I counseled her on the results of the workup and on return precautions; she expressed understanding and agreement.  FINAL CLINICAL IMPRESSION(S) / ED DIAGNOSES   Final diagnoses:  Left flank pain     Rx / DC Orders   ED Discharge Orders     None        Note:  This document was prepared using Dragon voice recognition software and may include unintentional dictation errors.    Dionne Bucy, MD 08/01/23 1349

## 2023-08-01 NOTE — ED Notes (Addendum)
Pt back from scan 

## 2023-08-20 DIAGNOSIS — G4733 Obstructive sleep apnea (adult) (pediatric): Secondary | ICD-10-CM | POA: Diagnosis not present

## 2023-08-24 DIAGNOSIS — M810 Age-related osteoporosis without current pathological fracture: Secondary | ICD-10-CM | POA: Diagnosis not present

## 2023-09-06 ENCOUNTER — Telehealth: Payer: Self-pay

## 2023-09-06 NOTE — Telephone Encounter (Signed)
Transition Care Management Follow-up Telephone Call Date of discharge and from where: 08/01/2023 [facility]. Patient declined to participate.   Jodi Kirby [facility]  [facility], [facility] Guide Direct Dial: [phone]  Website: [email or web address]

## 2023-09-19 DIAGNOSIS — G4733 Obstructive sleep apnea (adult) (pediatric): Secondary | ICD-10-CM | POA: Diagnosis not present

## 2023-09-27 NOTE — Progress Notes (Unsigned)
Name: Jodi Kirby   MRN: [id number]    DOB: 03/16/1946   Date:09/28/2023       Progress Note  Subjective  Chief Complaint  Follow Up  HPI  Discussed the use of AI scribe software for clinical note transcription with the patient, who gave verbal consent to proceed.  History of Present Illness   The patient, with a history of total knee replacement five years ago, presented for a regular follow-up. The primary concern was a sudden onset of swelling and pain in the right knee, which occurred the night before the consultation. The patient reported no recent trauma or unusual activity. Despite the application of Biofreeze and intake of Tylenol, the pain persisted. This was the first instance of such severe swelling since the knee replacement surgery, although the patient noted occasional popping and jumping sensations in the knee.  The patient also reported a recent unintentional weight loss of four pounds. Despite this, the patient denied any changes in appetite or dietary habits. Sleep disturbances were noted, with the patient reporting difficulty staying asleep and feeling groggy upon waking. The patient expressed a desire to change her current sleep medication, Trazodone, due to these side effects.  The patient also reported frequent nocturnal urination, necessitating bathroom breaks every two hours. This has been a chronic issue, with no associated burning sensation or other urinary symptoms. The patient uses a CPAP machine for sleep apnea but can only tolerate it for about three hours due to dry mouth.  The patient also reported occasional hand cramping, which she attributed to her current medication, Omeprazole. The patient had tried discontinuing the medication, which seemed to alleviate the cramping. However, she resumed taking it due to concerns about her reflux.  The patient also has a history of coronary artery disease and stable angina. She has nitroglycerin at home for as-needed  use but has not needed to use it recently. The patient also has a history of chronic kidney disease, which has recently progressed from stage 3A to 3B. The patient is not currently seeing a nephrologist.  The patient has a history of osteoporosis of the spine, which has improved from osteoporosis to osteopenia with Prolia treatment. She also has a history of degenerative disc disease of the lumbar spine, with a spinal fusion performed several years ago. The patient reported occasional back pain, which can reach an intensity of 8/10 during periods of prolonged standing. However, there was no radicular pain reported.  The patient also reported a history of mild cognitive dysfunction, which has shown improvement on recent testing. The patient remains independent, able to drive, and manage her finances. She engages in activities such as crossword puzzles and word searches to maintain her cognitive function.      Patient Active Problem List   Diagnosis Date Noted   Statin myopathy 06/09/2023   Muscle spasm 06/09/2023   Vitamin D deficiency 04/27/2022   Stage 3a chronic kidney disease (HCC) 04/27/2022   Vitamin B12 deficiency 12/29/2021   Anemia of chronic disease 11/19/2021   Lumbar stenosis 12/16/2020   Benign hypertension with chronic kidney disease, stage III (HCC) 12/22/2018   History of lumbar fusion 07/19/2018   DDD (degenerative disc disease), lumbosacral 07/19/2018   Obesity (BMI 30.0-34.9) 07/19/2018   Mild cognitive impairment 12/21/2017   Aortic atherosclerosis (HCC) 05/15/2017   Impingement syndrome of left shoulder 05/12/2016   Chronic insomnia 11/08/2015   Angina pectoris (HCC) 07/10/2015   Seasonal allergic rhinitis 07/09/2015   Gastroesophageal reflux disease without  esophagitis 07/09/2015   Bee sting allergy 07/09/2015   History of knee replacement procedure of right knee 04/29/2015   History of colonic polyps 09/24/2014   Fibrocystic breast 09/24/2014   Dyslipidemia  02/09/2013   Atherosclerosis of native coronary artery of native heart with stable angina pectoris (HCC) 02/09/2013   Obstructive sleep apnea on CPAP 02/09/2013   Hypertension 02/09/2013    Past Surgical History:  Procedure Laterality Date   ABDOMINAL HYSTERECTOMY  1970   ANKLE SURGERY Right 1980   ANTERIOR LATERAL LUMBAR FUSION WITH PERCUTANEOUS SCREW 2 LEVEL N/A 12/16/2020   Procedure: L2/3, L3/4 LATERAL LUMBAR FUSION, L2-4 PEDICLE SCREW FIXATION;  Surgeon: Lucy Chris, MD;  Location: [facility] OR[facility];  Service: Neurosurgery;  Laterality: N/A;   BLADDER SURGERY  2012   CARDIAC CATHETERIZATION  2003   [facility]   CARDIAC CATHETERIZATION     [facility]   CATARACT EXTRACTION W/PHACO Right 02/09/2018   Procedure: CATARACT EXTRACTION PHACO AND INTRAOCULAR LENS PLACEMENT (IOC) RIGHT;  Surgeon: Lockie Mola, MD;  Location: [facility];  Service: Ophthalmology;  Laterality: Right;  sleep apnea   CATARACT EXTRACTION W/PHACO Left 03/09/2018   Procedure: CATARACT EXTRACTION PHACO AND INTRAOCULAR LENS PLACEMENT (IOC);  Surgeon: Lockie Mola, MD;  Location: [facility];  Service: Ophthalmology;  Laterality: Left;  IVA TOPICALLEFT   COLONOSCOPY  2008, 2015   Dr. Servando Snare   COLONOSCOPY N/A 03/05/2022   Procedure: COLONOSCOPY;  Surgeon: Jaynie Collins, DO;  Location: [facility] END[facility]COPY;  Service: Gastroenterology;  Laterality: N/A;   CYSTOSTOMY     ESOPHAGOGASTRODUODENOSCOPY N/A 03/05/2022   Procedure: ESOPHAGOGASTRODUODENOSCOPY (EGD);  Surgeon: Jaynie Collins, DO;  Location: [facility] ENDOSCOPY;  Service: Gastroenterology;  Laterality: N/A;   KNEE SURGERY  08/18/2011   arthroscopic right   LUMBAR FUSION     L4-5, S-1   NECK SURGERY  2003   TOTAL KNEE ARTHROPLASTY Right 04/29/2015   Procedure: TOTAL KNEE ARTHROPLASTY;  Surgeon: Erin Sons, MD;  Location: [facility] ORS;  Service: Orthopedics;  Laterality: Right;   TUBAL LIGATION      Family History  Problem Relation Age of Onset    Kidney disease Mother    Hypertension Mother    CAD Father    Healthy Sister    Kidney disease Son    Kidney cancer Neg Hx    Bladder Cancer Neg Hx     Social History   Tobacco Use   Smoking status: Never   Smokeless tobacco: Never   Tobacco comments:    smoking cessation materials not required  Substance Use Topics   Alcohol use: No    Alcohol/week: 0.0 standard drinks of alcohol     Current Outpatient Medications:    acetaminophen (TYLENOL) 650 MG CR tablet, Take 650 mg by mouth daily as needed for pain., Disp: , Rfl:    Ascorbic Acid (VITAMIN C WITH ROSE HIPS) 1000 MG tablet, Take 1,000 mg by mouth daily. With Zinc, Disp: , Rfl:    aspirin EC 81 MG tablet, Take 81 mg by mouth daily. Swallow whole., Disp: , Rfl:    baclofen (LIORESAL) 10 MG tablet, Take 1 tablet (10 mg total) by mouth at bedtime as needed for muscle spasms., Disp: 90 tablet, Rfl: 1   Cholecalciferol (VITAMIN D3) 1000 UNITS CAPS, Take 1,000 Units by mouth every morning. , Disp: , Rfl:    denosumab (PROLIA) 60 MG/ML SOSY injection, Inject 60 mg into the skin every 6 (six) months., Disp: , Rfl:    EPINEPHrine (EPI-PEN) 0.3  mg/0.3 mL DEVI, Inject 0.3 mg into the muscle as needed (anaphylaxis)., Disp: , Rfl:    fluticasone (FLONASE) 50 MCG/ACT nasal spray, Place 2 sprays into both nostrils daily., Disp: 48 g, Rfl: 1   Multiple Vitamins-Minerals (CENTRUM SILVER PO), Take 1 tablet by mouth every morning. , Disp: , Rfl:    nitroGLYCERIN (NITROSTAT) 0.4 MG SL tablet, Place 1 tablet (0.4 mg total) under the tongue every 5 (five) minutes as needed for chest pain., Disp: 25 tablet, Rfl: 0   Olmesartan-amLODIPine-HCTZ 40-10-12.5 MG TABS, Take 1 tablet by mouth once daily, Disp: 90 tablet, Rfl: 1   omeprazole (PRILOSEC) 40 MG capsule, Take 1 capsule (40 mg total) by mouth daily., Disp: 90 capsule, Rfl: 1   polyethylene glycol (MIRALAX / GLYCOLAX) 17 g packet, Take 17 g by mouth daily., Disp: 14 each, Rfl: 0   traZODone  (DESYREL) 50 MG tablet, Take 1 tablet (50 mg total) by mouth at bedtime as needed for sleep., Disp: 90 tablet, Rfl: 0  Allergies  Allergen Reactions   Niaspan [Niacin Er (Antihyperlipidemic)] Other (See Comments)    Patient states she can't remember the reaction because it so long ago.   Oxycodone-Acetaminophen Nausea Only   Statins Other (See Comments)    Muscle and joint pain.    I personally reviewed active problem list, medication list, allergies, family history, social history, health maintenance with the patient/caregiver today.   ROS  Ten systems reviewed and is negative except as mentioned in HPI    Objective  Vitals:   09/28/23 0751  BP: 128/70  Pulse: 77  Resp: 16  SpO2: 100%  Weight: 181 lb (82.1 kg)  Height: 5\' 2"  (1.575 m)    Body mass index is 33.11 kg/m.  Physical Exam  Constitutional: Patient appears well-developed and well-nourished. Obese  No distress.  HEENT: head atraumatic, normocephalic, pupils equal and reactive to light, neck supple, throat within normal limits Cardiovascular: Normal rate, regular rhythm and normal heart sounds.  No murmur heard. No BLE edema. Pulmonary/Chest: Effort normal and breath sounds normal. No respiratory distress. Abdominal: Soft.  There is no tenderness. Psychiatric: Patient has a normal mood and affect. behavior is normal. Judgment and thought content normal.  Muscular skeletal: effusion right knee, pain with extension, no redness or increase in warmth noticed    PHQ2/9:    09/28/2023    7:51 AM 07/19/2023    9:43 AM 06/09/2023    9:22 AM 05/24/2023    9:59 AM 05/24/2023    9:54 AM  Depression screen PHQ 2/9  Decreased Interest 0 0 0 0 0  Down, Depressed, Hopeless 0 0 0 0 0  PHQ - 2 Score 0 0 0 0 0  Altered sleeping 0  0 0   Tired, decreased energy 0  0 0   Change in appetite 0  0 0   Feeling bad or failure about yourself  0  0 0   Trouble concentrating 0  0 0   Moving slowly or fidgety/restless 0  0 0    Suicidal thoughts 0  0 0   PHQ-9 Score 0  0 0     phq 9 is negative   Fall Risk:    09/28/2023    7:50 AM 07/19/2023    9:42 AM 06/09/2023    9:21 AM 05/24/2023    9:59 AM 03/01/2023    9:25 AM  Fall Risk   Falls in the past year? 0 0 0 0 0  Number  falls in past yr: 0 0   0  Injury with Fall? 0 0   0  Risk for fall due to : No Fall Risks No Fall Risks No Fall Risks No Fall Risks No Fall Risks  Follow up Falls prevention discussed  Falls prevention discussed Falls prevention discussed;Education provided;Falls evaluation completed Falls prevention discussed      Functional Status Survey: Is the patient deaf or have difficulty hearing?: No Does the patient have difficulty seeing, even when wearing glasses/contacts?: No Does the patient have difficulty concentrating, remembering, or making decisions?: No Does the patient have difficulty walking or climbing stairs?: No Does the patient have difficulty dressing or bathing?: No Does the patient have difficulty doing errands alone such as visiting a doctor's office or shopping?: No    Assessment & Plan  Assessment and Plan    Right Knee Swelling Acute onset of swelling and pain in the right knee, which was replaced 5 years ago. No redness or trauma. Discussed the possibility of hardware malfunction and the need for orthopedic evaluation if symptoms persist. -Apply Voltaren gel for local anti-inflammatory effect. -Referral to orthopedic surgeon if symptoms persist.  Insomnia Chronic insomnia, currently on Trazodone but experiencing grogginess and inconsistent use. -Switch from Trazodone to Quetiapine for sleep, monitor for effectiveness.  Nocturia Increased frequency of urination at night. -Prescribe Detrol 2mg  at night to manage symptoms.  Gastroesophageal Reflux Disease (GERD) Experiencing cramping with current medication, Omeprazole. -Switch from Omeprazole to Nexium to manage GERD symptoms and potentially reduce cramping.  Although cramping likely secondary to drop of GFR  Chronic Kidney Disease (CKD) Stage 3B Decreased GFR, now below 40, indicating progression from stage 3A to 3B. -Order labs to further evaluate kidney function. -Referral to nephrologist for further management depending on repeat levels today   Hyperlipidemia LDL above target, patient has history of intolerance to statins and Zetia. -Consider Nexlizet for cholesterol management, pending insurance approval.  Osteoporosis Stable on Prolia, recent improvement from osteoporosis to osteopenia. -Continue current treatment.  Chronic Constipation Managed with intermittent over-the-counter Miralax use. -Continue current management.  Coronary Artery Disease (CAD) Stable angina, no recent chest pain. -Continue current management with daily baby aspirin and nitroglycerin as needed.  General Health Maintenance -Declined flu and pneumonia vaccinations. -Check A1C, last checked over a year ago. -Consider mammogram for routine breast cancer screening.

## 2023-09-28 ENCOUNTER — Encounter: Payer: Self-pay | Admitting: Family Medicine

## 2023-09-28 ENCOUNTER — Ambulatory Visit: Payer: Medicare PPO | Admitting: Family Medicine

## 2023-09-28 VITALS — BP 128/70 | HR 77 | Resp 16 | Ht 62.0 in | Wt 181.0 lb

## 2023-09-28 DIAGNOSIS — F5104 Psychophysiologic insomnia: Secondary | ICD-10-CM | POA: Diagnosis not present

## 2023-09-28 DIAGNOSIS — I25118 Atherosclerotic heart disease of native coronary artery with other forms of angina pectoris: Secondary | ICD-10-CM

## 2023-09-28 DIAGNOSIS — K219 Gastro-esophageal reflux disease without esophagitis: Secondary | ICD-10-CM | POA: Diagnosis not present

## 2023-09-28 DIAGNOSIS — G4733 Obstructive sleep apnea (adult) (pediatric): Secondary | ICD-10-CM

## 2023-09-28 DIAGNOSIS — I700 Atherosclerosis of aorta: Secondary | ICD-10-CM | POA: Diagnosis not present

## 2023-09-28 DIAGNOSIS — N1832 Chronic kidney disease, stage 3b: Secondary | ICD-10-CM | POA: Diagnosis not present

## 2023-09-28 DIAGNOSIS — Z96651 Presence of right artificial knee joint: Secondary | ICD-10-CM

## 2023-09-28 DIAGNOSIS — G720 Drug-induced myopathy: Secondary | ICD-10-CM | POA: Diagnosis not present

## 2023-09-28 DIAGNOSIS — R351 Nocturia: Secondary | ICD-10-CM

## 2023-09-28 DIAGNOSIS — R739 Hyperglycemia, unspecified: Secondary | ICD-10-CM

## 2023-09-28 DIAGNOSIS — T466X5A Adverse effect of antihyperlipidemic and antiarteriosclerotic drugs, initial encounter: Secondary | ICD-10-CM

## 2023-09-28 DIAGNOSIS — M62838 Other muscle spasm: Secondary | ICD-10-CM

## 2023-09-28 DIAGNOSIS — I10 Essential (primary) hypertension: Secondary | ICD-10-CM | POA: Diagnosis not present

## 2023-09-28 MED ORDER — DICLOFENAC SODIUM 1 % EX GEL
4.0000 g | Freq: Four times a day (QID) | CUTANEOUS | 1 refills | Status: DC
Start: 2023-09-28 — End: 2023-12-29

## 2023-09-28 MED ORDER — QUETIAPINE FUMARATE 25 MG PO TABS
25.0000 mg | ORAL_TABLET | Freq: Every day | ORAL | 0 refills | Status: DC
Start: 2023-09-28 — End: 2023-12-29

## 2023-09-28 MED ORDER — ESOMEPRAZOLE MAGNESIUM 40 MG PO CPDR
40.0000 mg | DELAYED_RELEASE_CAPSULE | Freq: Every day | ORAL | 1 refills | Status: DC
Start: 2023-09-28 — End: 2024-03-22

## 2023-09-28 MED ORDER — NEXLIZET 180-10 MG PO TABS
1.0000 | ORAL_TABLET | Freq: Every day | ORAL | 1 refills | Status: DC
Start: 2023-09-28 — End: 2024-03-28

## 2023-09-28 MED ORDER — TOLTERODINE TARTRATE 2 MG PO TABS
2.0000 mg | ORAL_TABLET | Freq: Every evening | ORAL | 0 refills | Status: DC
Start: 2023-09-28 — End: 2023-12-29

## 2023-09-29 LAB — COMPLETE METABOLIC PANEL WITH GFR
AG Ratio: 1.8 (calc) (ref 1.0–2.5)
ALT: 12 U/L (ref 6–29)
AST: 12 U/L (ref 10–35)
Albumin: 4.5 g/dL (ref 3.6–5.1)
Alkaline phosphatase (APISO): 81 U/L (ref 37–153)
BUN/Creatinine Ratio: 16 (calc) (ref 6–22)
BUN: 23 mg/dL (ref 7–25)
CO2: 27 mmol/L (ref 20–32)
Calcium: 9.4 mg/dL (ref 8.6–10.4)
Chloride: 105 mmol/L (ref 98–110)
Creat: 1.48 mg/dL — ABNORMAL HIGH (ref 0.60–1.00)
Globulin: 2.5 g/dL (ref 1.9–3.7)
Glucose, Bld: 104 mg/dL — ABNORMAL HIGH (ref 65–99)
Potassium: 4 mmol/L (ref 3.5–5.3)
Sodium: 142 mmol/L (ref 135–146)
Total Bilirubin: 0.4 mg/dL (ref 0.2–1.2)
Total Protein: 7 g/dL (ref 6.1–8.1)
eGFR: 36 mL/min/{1.73_m2} — ABNORMAL LOW (ref >=60)

## 2023-09-29 LAB — HEMOGLOBIN A1C
Hgb A1c MFr Bld: 5.7 %{Hb} — ABNORMAL HIGH (ref <5.7)
Mean Plasma Glucose: 117 mg/dL
eAG (mmol/L): 6.5 mmol/L

## 2023-09-29 LAB — MAGNESIUM: Magnesium: 2.1 mg/dL (ref 1.5–2.5)

## 2023-09-29 LAB — PARATHYROID HORMONE, INTACT (NO CA): PTH: 313 pg/mL — ABNORMAL HIGH (ref 16–77)

## 2023-10-01 ENCOUNTER — Other Ambulatory Visit: Payer: Self-pay | Admitting: Family Medicine

## 2023-10-01 DIAGNOSIS — N1831 Chronic kidney disease, stage 3a: Secondary | ICD-10-CM

## 2023-10-01 DIAGNOSIS — E213 Hyperparathyroidism, unspecified: Secondary | ICD-10-CM

## 2023-10-11 ENCOUNTER — Ambulatory Visit: Payer: Medicare PPO | Admitting: Family Medicine

## 2023-10-19 DIAGNOSIS — G4733 Obstructive sleep apnea (adult) (pediatric): Secondary | ICD-10-CM | POA: Diagnosis not present

## 2023-10-20 ENCOUNTER — Other Ambulatory Visit: Payer: Self-pay | Admitting: Cardiovascular Disease

## 2023-10-20 DIAGNOSIS — I10 Essential (primary) hypertension: Secondary | ICD-10-CM

## 2023-10-20 NOTE — Telephone Encounter (Signed)
Pt scheduled on 2/7

## 2023-10-20 NOTE — Telephone Encounter (Signed)
Hi Lauren,  Will you please outreach patient to schedule past due follow up appt.   Thank you,  Ferne Coe

## 2023-11-01 DIAGNOSIS — D631 Anemia in chronic kidney disease: Secondary | ICD-10-CM | POA: Diagnosis not present

## 2023-11-01 DIAGNOSIS — N2581 Secondary hyperparathyroidism of renal origin: Secondary | ICD-10-CM | POA: Diagnosis not present

## 2023-11-01 DIAGNOSIS — Z1231 Encounter for screening mammogram for malignant neoplasm of breast: Secondary | ICD-10-CM | POA: Diagnosis not present

## 2023-11-01 DIAGNOSIS — I10 Essential (primary) hypertension: Secondary | ICD-10-CM | POA: Diagnosis not present

## 2023-11-01 DIAGNOSIS — N1832 Chronic kidney disease, stage 3b: Secondary | ICD-10-CM | POA: Diagnosis not present

## 2023-11-01 LAB — HM MAMMOGRAPHY

## 2023-11-04 ENCOUNTER — Other Ambulatory Visit: Payer: Self-pay | Admitting: Nephrology

## 2023-11-04 DIAGNOSIS — N1832 Chronic kidney disease, stage 3b: Secondary | ICD-10-CM

## 2023-11-04 DIAGNOSIS — I10 Essential (primary) hypertension: Secondary | ICD-10-CM

## 2023-11-08 ENCOUNTER — Telehealth: Payer: Self-pay | Admitting: Family Medicine

## 2023-11-09 ENCOUNTER — Ambulatory Visit
Admission: RE | Admit: 2023-11-09 | Discharge: 2023-11-09 | Disposition: A | Discharge status: Home / Self Care | Payer: Medicare PPO | Source: Ambulatory Visit | Attending: Nephrology | Admitting: Nephrology

## 2023-11-09 DIAGNOSIS — I10 Essential (primary) hypertension: Secondary | ICD-10-CM | POA: Insufficient documentation

## 2023-11-09 DIAGNOSIS — N1832 Chronic kidney disease, stage 3b: Secondary | ICD-10-CM | POA: Insufficient documentation

## 2023-11-09 DIAGNOSIS — N189 Chronic kidney disease, unspecified: Secondary | ICD-10-CM | POA: Diagnosis not present

## 2023-11-19 DIAGNOSIS — G4733 Obstructive sleep apnea (adult) (pediatric): Secondary | ICD-10-CM | POA: Diagnosis not present

## 2023-11-30 DIAGNOSIS — D631 Anemia in chronic kidney disease: Secondary | ICD-10-CM | POA: Diagnosis not present

## 2023-11-30 DIAGNOSIS — N184 Chronic kidney disease, stage 4 (severe): Secondary | ICD-10-CM | POA: Diagnosis not present

## 2023-11-30 DIAGNOSIS — I10 Essential (primary) hypertension: Secondary | ICD-10-CM | POA: Diagnosis not present

## 2023-11-30 DIAGNOSIS — N2581 Secondary hyperparathyroidism of renal origin: Secondary | ICD-10-CM | POA: Diagnosis not present

## 2023-12-09 NOTE — Progress Notes (Signed)
 Evaluation Performed:  Follow-up visit  Date:  12/10/2023   ID:  [name], [name] 08-08-1946, MRN [id number]  Patient Location:  [address] [facility] [address]   Provider location:   [facility], [place] office  PCP:  Sowles, Krichna, MD  Cardiologist:  [facility]  Cc: Follow-up of chest pain   History of Present Illness:    Jodi Kirby is a 78 y.o. female past medical history of coronary  arterial disease, catheterization in 2003 with 20% disease in her RCA and LAD,  hyperlipidemia, back surgery,  obstructive sleep apnea on CPAP,  Total knee replacement Chronic chest pressure.  She presents for routine followup of her CAD, hyperlipidemia  LOV January 2024 In follow-up reports she is doing well No regular exercise program  Back surgury, 3 years ago, limiting exercise  Lab work reviewed A1C 5.7  Off statins, Reports tolerating Nexlizet , occasional cramping Repeat lab work planned with Dr. Glenard later this month $40 a month Unable to tolerate Crestor   No significant chest pain or shortness of breath on exertion  CT scan abdomen showing mild aortic atherosclerosis  Prior orthopedic surgery due to spinal stenosis and back pain , surgery, 12/2020  EKG personally reviewed by myself on todays visit EKG Interpretation Date/Time:  Friday December 10 2023 08:33:57 EST Ventricular Rate:  72 PR Interval:  130 QRS Duration:  92 QT Interval:  404 QTC Calculation: 442 R Axis:   21  Text Interpretation: Sinus bradycardia with occasional Premature ventricular complexes When compared with ECG of 27-Dec-2022 21:19, No significant change was found Confirmed by Perla Lye [phone]) on 12/10/2023 8:57:06 AM    Other past medical history reviewed admitted to the hospital August 2017 for chest pain Cardiac enzymes negative,  She had outpatient Exercise stress Myoview  test 07/29/2016 showing no ischemia, Ejection fraction  62%   Stress test in 2014 And August 2017 showing no ischemia   Past Medical History:  Diagnosis Date   Allergic rhinitis, cause unspecified    Allergy    Anxiety    Chronic kidney disease    Coronary artery disease    DDD (degenerative disc disease), lumbar    Dysmetabolic syndrome X    Dysuria    Esophageal reflux    Essential hypertension, benign    HNP (herniated nucleus pulposus), lumbar    Dr. Avanell [facility])   Hyperlipidemia    Lumbago    Lumbar radiculitis    Dr. 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    Toxic effect of venom(989.5)    Unspecified iridocyclitis    Unspecified vitamin D  deficiency    Vertigo 2018   1 episode   Past Surgical History:  Procedure Laterality Date   ABDOMINAL HYSTERECTOMY  1970   ANKLE SURGERY Right 1980   ANTERIOR LATERAL LUMBAR FUSION WITH PERCUTANEOUS SCREW 2 LEVEL N/A 12/16/2020   Procedure: L2/3, L3/4 LATERAL LUMBAR FUSION, L2-4 PEDICLE SCREW FIXATION;  Surgeon: Bluford Standing, MD;  Location: [facility] ORS;  Service: Neurosurgery;  Laterality: N/A;   BLADDER SURGERY  2012   CARDIAC CATHETERIZATION  2003   [facility]   CARDIAC CATHETERIZATION     [facility]   CATARACT EXTRACTION W/PHACO Right 02/09/2018   Procedure: CATARACT EXTRACTION PHACO AND INTRAOCULAR LENS PLACEMENT (IOC) RIGHT;  Surgeon: Mittie Gaskin, MD;  Location[facility];  Service: Ophthalmology;  Laterality: Right;  sleep apnea   CATARACT EXTRACTION W/PHACO Left 03/09/2018   Procedure: CATARACT EXTRACTION PHACO AND INTRAOCULAR LENS PLACEMENT (IOC);  Surgeon: Mittie Gaskin, MD;  Location: [facility];   Service: Ophthalmology;  Laterality: Left;  IVA TOPICALLEFT   COLONOSCOPY  2008, 2015   Dr. Jinny   COLONOSCOPY N/A 03/05/2022   Procedure: COLONOSCOPY;  Surgeon: Onita Elspeth Sharper, DO;  Location: [facility] ENDOSCOPY;  Service: Gastroenterology;  Laterality: N/A;   CYSTOSTOMY     ESOPHAGOGASTRODUODENOSCOPY N/A 03/05/2022   Procedure: ESOPHAGOGASTRODUODENOSCOPY (EGD);  Surgeon: Onita Elspeth Sharper, DO;  Location: [facility] END[facility]COPY;  Service: Gastroenterology;  Laterality: N/A;   KNEE SURGERY  08/18/2011   arthroscopic right   LUMBAR FUSION     L4-5, S-1   NECK SURGERY  2003   TOTAL KNEE ARTHROPLASTY Right 04/29/2015   Procedure: TOTAL KNEE ARTHROPLASTY;  Surgeon: Helayne Glenn, MD;  Location: [facility] ORS;  Service: Orthopedics;  Laterality: Right;   TUBAL LIGATION       Allergies:   Niaspan [niacin er (antihyperlipidemic)], Oxycodone -acetaminophen , and Statins   Social History   Tobacco Use   Smoking status: Never   Smokeless tobacco: Never   Tobacco comments:    smoking cessation materials not required  Vaping Use   Vaping status: Never Used  Substance Use Topics   Alcohol use: No    Alcohol/week: 0.0 standard drinks of alcohol   Drug use: No     Current Outpatient Medications on File Prior to Visit  Medication Sig Dispense Refill   acetaminophen  (TYLENOL ) 650 MG CR tablet Take 650 mg by mouth daily as needed for pain.     Ascorbic Acid  (VITAMIN C WITH ROSE HIPS) 1000 MG tablet Take 1,000 mg by mouth daily. With Zinc     aspirin  EC 81 MG tablet Take 81 mg by mouth daily. Swallow whole.     baclofen  (LIORESAL ) 10 MG tablet Take 1 tablet (10 mg total) by mouth at bedtime as needed for muscle spasms. 90 tablet 1   Bempedoic Acid-Ezetimibe  (NEXLIZET ) 180-10 MG TABS Take 1 tablet by mouth daily at 12 noon. 90 tablet 1   Cholecalciferol  (VITAMIN D3) 1000 UNITS CAPS Take 1,000 Units by mouth every morning.      denosumab  (PROLIA ) 60 MG/ML SOSY injection Inject 60 mg into the skin every 6  (six) months.     diclofenac  Sodium (VOLTAREN ) 1 % GEL Apply 4 g topically 4 (four) times daily. 100 g 1   EPINEPHrine  (EPI-PEN) 0.3 mg/0.3 mL DEVI Inject 0.3 mg into the muscle as needed (anaphylaxis).     esomeprazole  (NEXIUM ) 40 MG capsule Take 1 capsule (40 mg total) by mouth daily. 90 capsule 1   fluticasone  (FLONASE ) 50 MCG/ACT nasal spray Place 2 sprays into both nostrils daily. 48 g 1   Multiple Vitamins-Minerals (CENTRUM SILVER PO) Take 1 tablet by mouth every morning.      nitroGLYCERIN  (NITROSTAT ) 0.4 MG SL tablet Place 1 tablet (0.4 mg total) under the tongue every 5 (five) minutes as needed for chest pain. 25 tablet 0   Olmesartan -amLODIPine -HCTZ 40-10-12.5 MG TABS Take 1 tablet by mouth once daily 90 tablet 0   polyethylene glycol (MIRALAX  /  GLYCOLAX ) 17 g packet Take 17 g by mouth daily. 14 each 0   QUEtiapine  (SEROQUEL ) 25 MG tablet Take 1 tablet (25 mg total) by mouth at bedtime. For sleep 90 tablet 0   tolterodine  (DETROL ) 2 MG tablet Take 1 tablet (2 mg total) by mouth every evening. For bladder 90 tablet 0   No current facility-administered medications on file prior to visit.     Family Hx: The patient's family history includes CAD in her father; Healthy in her sister; Hypertension in her mother; Kidney disease in her mother and son. There is no history of Kidney cancer or Bladder Cancer.  ROS:   Please see the history of present illness.    Review of Systems  Constitutional: Negative.   HENT: Negative.    Respiratory: Negative.    Cardiovascular: Negative.   Gastrointestinal: Negative.   Musculoskeletal: Negative.   Neurological: Negative.   Psychiatric/Behavioral: Negative.    All other systems reviewed and are negative.    Labs/Other Tests and Data Reviewed:    Recent Labs: 08/01/2023: Hemoglobin 10.6; Platelets 279 09/28/2023: ALT 12; BUN 23; Creat 1.48; Magnesium  2.1; Potassium 4.0; Sodium 142   Recent Lipid Panel Lab Results  Component Value Date/Time    CHOL 224 (H) 06/09/2023 09:49 AM   CHOL 211 (H) 11/08/2015 11:01 AM   TRIG 254 (H) 06/09/2023 09:49 AM   HDL 46 (L) 06/09/2023 09:49 AM   HDL 48 11/08/2015 11:01 AM   CHOLHDL 4.9 06/09/2023 09:49 AM   LDLCALC 139 (H) 06/09/2023 09:49 AM    Wt Readings from Last 3 Encounters:  12/10/23 185 lb (83.9 kg)  09/28/23 181 lb (82.1 kg)  07/19/23 185 lb 4.8 oz (84.1 kg)     Exam:    Vital Signs: Vital signs may also be detailed in the HPI BP 122/84   Pulse 72   Ht 5' 2 (1.575 m)   Wt 185 lb (83.9 kg)   SpO2 95%   BMI 33.84 kg/m   Constitutional:  oriented to person, place, and time. No distress.  HENT:  Head: Grossly normal Eyes:  no discharge. No scleral icterus.  Neck: No JVD, no carotid bruits  Cardiovascular: Regular rate and rhythm, no murmurs appreciated Pulmonary/Chest: Clear to auscultation bilaterally, no wheezes or rails Abdominal: Soft.  no distension.  no tenderness.  Musculoskeletal: Normal range of motion Neurological:  normal muscle tone. Coordination normal. No atrophy Skin: Skin warm and dry Psychiatric: normal affect, pleasant  ASSESSMENT & PLAN:    Problem List Items Addressed This Visit       Cardiology Problems   Benign hypertension with chronic kidney disease, stage III (HCC)   Relevant Orders   EKG 12-Lead (Completed)   Aortic atherosclerosis (HCC)   Relevant Orders   EKG 12-Lead (Completed)   Atherosclerosis of native coronary artery of native heart with stable angina pectoris (HCC) - Primary   Relevant Orders   EKG 12-Lead (Completed)     Other   Stage 3a chronic kidney disease (HCC)   Relevant Orders   EKG 12-Lead (Completed)   Other Visit Diagnoses       Statin intolerance       Relevant Orders   EKG 12-Lead (Completed)     Mixed hyperlipidemia       Relevant Orders   EKG 12-Lead (Completed)      Essential hypertension Blood pressure is well controlled on today's visit. No changes made to the medications.  Aortic  atherosclerosis Aortic atherosclerosis is  mild on CT scan images No further workup needed  Hyperlipidemia Tolerating Nexlizet ,  Repeat lab work in several weeks time  Chronic kidney disease Creatinine 1.7, recommend she stay hydrated Followed by nephrology  Signed, Mikel Hardgrove, MD  [facility] [address], [place], [place] [address]

## 2023-12-10 ENCOUNTER — Encounter: Payer: Self-pay | Admitting: Cardiovascular Disease

## 2023-12-10 ENCOUNTER — Ambulatory Visit: Payer: Medicare PPO | Attending: Cardiovascular Disease | Admitting: Cardiovascular Disease

## 2023-12-10 VITALS — BP 122/84 | HR 72 | Ht 62.0 in | Wt 185.0 lb

## 2023-12-10 DIAGNOSIS — Z789 Other specified health status: Secondary | ICD-10-CM

## 2023-12-10 DIAGNOSIS — N183 Chronic kidney disease, stage 3 unspecified: Secondary | ICD-10-CM | POA: Diagnosis not present

## 2023-12-10 DIAGNOSIS — I700 Atherosclerosis of aorta: Secondary | ICD-10-CM

## 2023-12-10 DIAGNOSIS — N1831 Chronic kidney disease, stage 3a: Secondary | ICD-10-CM

## 2023-12-10 DIAGNOSIS — I25118 Atherosclerotic heart disease of native coronary artery with other forms of angina pectoris: Secondary | ICD-10-CM

## 2023-12-10 DIAGNOSIS — I129 Hypertensive chronic kidney disease with stage 1 through stage 4 chronic kidney disease, or unspecified chronic kidney disease: Secondary | ICD-10-CM | POA: Diagnosis not present

## 2023-12-10 DIAGNOSIS — E782 Mixed hyperlipidemia: Secondary | ICD-10-CM | POA: Diagnosis not present

## 2023-12-10 DIAGNOSIS — I10 Essential (primary) hypertension: Secondary | ICD-10-CM

## 2023-12-10 MED ORDER — OLMESARTAN-AMLODIPINE-HCTZ 40-10-12.5 MG PO TABS: 1.0000 | ORAL_TABLET | Freq: Every day | ORAL | 3 refills | Status: AC

## 2023-12-10 NOTE — Patient Instructions (Signed)

## 2023-12-20 DIAGNOSIS — G4733 Obstructive sleep apnea (adult) (pediatric): Secondary | ICD-10-CM | POA: Diagnosis not present

## 2023-12-29 ENCOUNTER — Ambulatory Visit: Payer: Medicare PPO | Admitting: Family Medicine

## 2023-12-29 ENCOUNTER — Encounter: Payer: Self-pay | Admitting: Family Medicine

## 2023-12-29 VITALS — BP 122/70 | HR 71 | Temp 98.4°F | Resp 16 | Ht 62.0 in | Wt 184.3 lb

## 2023-12-29 DIAGNOSIS — G720 Drug-induced myopathy: Secondary | ICD-10-CM

## 2023-12-29 DIAGNOSIS — F5104 Psychophysiologic insomnia: Secondary | ICD-10-CM

## 2023-12-29 DIAGNOSIS — N2581 Secondary hyperparathyroidism of renal origin: Secondary | ICD-10-CM | POA: Diagnosis not present

## 2023-12-29 DIAGNOSIS — R351 Nocturia: Secondary | ICD-10-CM

## 2023-12-29 DIAGNOSIS — I25118 Atherosclerotic heart disease of native coronary artery with other forms of angina pectoris: Secondary | ICD-10-CM

## 2023-12-29 DIAGNOSIS — I10 Essential (primary) hypertension: Secondary | ICD-10-CM | POA: Diagnosis not present

## 2023-12-29 DIAGNOSIS — N1831 Chronic kidney disease, stage 3a: Secondary | ICD-10-CM

## 2023-12-29 DIAGNOSIS — G4733 Obstructive sleep apnea (adult) (pediatric): Secondary | ICD-10-CM

## 2023-12-29 DIAGNOSIS — I700 Atherosclerosis of aorta: Secondary | ICD-10-CM

## 2023-12-29 DIAGNOSIS — N1832 Chronic kidney disease, stage 3b: Secondary | ICD-10-CM

## 2023-12-29 DIAGNOSIS — T466X5A Adverse effect of antihyperlipidemic and antiarteriosclerotic drugs, initial encounter: Secondary | ICD-10-CM

## 2023-12-29 MED ORDER — TOLTERODINE TARTRATE 2 MG PO TABS
2.0000 mg | ORAL_TABLET | Freq: Every evening | ORAL | 1 refills | Status: DC
Start: 2023-12-29 — End: 2024-06-27

## 2023-12-29 MED ORDER — QUETIAPINE FUMARATE 25 MG PO TABS
25.0000 mg | ORAL_TABLET | Freq: Every day | ORAL | 1 refills | Status: DC
Start: 2023-12-29 — End: 2024-06-27

## 2023-12-29 NOTE — Progress Notes (Signed)
 Name: Jodi Kirby   MRN: [id number]    DOB: May 02, 1946   Date:12/29/2023       Progress Note  Subjective  Chief Complaint  Chief Complaint  Patient presents with   Medical Management of Chronic Issues   HPI   OSA: she usually wears her CPAP every night, she is able to keep it on for at least 3 hours but usually close to 6 hours    Chronic Insomnia : she is now on Seroquel and tolerating it well , also sleeping better since on Detrol and nocturia down to at most twice per night    DDD lumbar spine:  History of spinal fusion and L3-4 .and revision in Feb 2022 She states back pain is intermittent, she feels sore and stiff , she recently went to [place] and back pain got worse but is feeling to baseline now. Pain 3/10 , radiculitis down right leg is seldom, takes prn baclofen muscle spasms . She still has medication at home    IMPRESSION: 10/09/2020  1. Mildly progressive disc degeneration at L2-3 with moderate spinal stenosis and mild-to-moderate left greater than right neural foraminal and lateral recess stenosis. 2. Postoperative changes at L3-4 and L4-5 with unchanged residual spinal and neural foraminal stenosis at L3-4.   HTN with CKI stage IIIb secondary hyperparathyroidism : she is back on one pill Tribenzor  no recent episodes of chest pain or palpitation, denies orthopnea or dizziness. BP is at goal, seeing nephrologist, GFR stable , avoiding NSAID's.   Right shoulder and right anterior chest wall pain: she has Biofreeze at home and baclofen, taking tylenol occasionally but is aware cannot take NSAID's. Discussed PT    Hyperlipidemia/aorta atherosclerosis/Unstable Angina : unable to tolerate statins because it causes myopathy Dr. Mariah Milling started her on Zetia 07/2018 and initially she was tolerating it but cramps increased and medication was stopped end of 2023  We tried Vascepa but too costly, also unable to afford [id number] . She is under the care of Dr. Mariah Milling and has CAD , takes  aspirin  and currently tolerating Nexlizet. We will recheck labs in August   Anemia: normal iron storage,  she is taking B12 , hemoglobin stable, checked by nephrologist in Dec, likely anemia of chronic disease    Osteoporosis : diagnosed in 2016 - and was started on Fosamax, previous bone density in 2018, no side effects of medication. -2.4 spine -2.2 of femur , she had repeat study at [facility] 10/2020 and showed T score spine of -2.8 and femur -2.1 . She is seeing Dr. Gershon Crane at [facility], had SPEP, Pth and vitamin D done, first infusion of Prolia was  Spring 2024 Last bone density March 2024 and improved, now osteopenia instead of osteoporosis , she is going back soon for 3 rd dose. Tolerated it well    Hyperglycemia: she denies polyphagia, polydipsia or polyuria. Nocturia improved significantly with detrol LA, last A1C was 5.7 %   Mild cognitive dysfunction: CIT was 8 in 2019, down to 4 in 2020 and last checked in 2021 was down to 2, her MMS 05/2022 was normal at 30, also normal CIT in 2024  . She has been doing  cross word puzzles, word search. She still drives, take medications on her own, pays her bills, groceries shopping . Stable    GERD: she had EGD in 2023 that showed hiatal hernia, gastritis and was doing well on Omeprazole 20 mg, but  heartburn increased Spring 2024 and she is now  on 40 mg dose and symptoms controlled     Patient Active Problem List   Diagnosis Date Noted   Statin myopathy 06/09/2023   Muscle spasm 06/09/2023   Vitamin D deficiency 04/27/2022   Stage 3a chronic kidney disease (HCC) 04/27/2022   Vitamin B12 deficiency 12/29/2021   Anemia of chronic disease 11/19/2021   Lumbar stenosis 12/16/2020   Benign hypertension with chronic kidney disease, stage III (HCC) 12/22/2018   History of lumbar fusion 07/19/2018   DDD (degenerative disc disease), lumbosacral 07/19/2018   Obesity (BMI 30.0-34.9) 07/19/2018   Mild cognitive impairment 12/21/2017   Aortic  atherosclerosis (HCC) 05/15/2017   Impingement syndrome of left shoulder 05/12/2016   Chronic insomnia 11/08/2015   Angina pectoris (HCC) 07/10/2015   Seasonal allergic rhinitis 07/09/2015   Gastroesophageal reflux disease without esophagitis 07/09/2015   Bee sting allergy 07/09/2015   History of knee replacement procedure of right knee 04/29/2015   History of colonic polyps 09/24/2014   Fibrocystic breast 09/24/2014   Dyslipidemia 02/09/2013   Atherosclerosis of native coronary artery of native heart with stable angina pectoris (HCC) 02/09/2013   Obstructive sleep apnea on CPAP 02/09/2013   Hypertension 02/09/2013    Past Surgical History:  Procedure Laterality Date   ABDOMINAL HYSTERECTOMY  1970   ANKLE SURGERY Right 1980   ANTERIOR LATERAL LUMBAR FUSION WITH PERCUTANEOUS SCREW 2 LEVEL N/A 12/16/2020   Procedure: L2/3, L3/4 LATERAL LUMBAR FUSION, L2-4 PEDICLE SCREW FIXATION;  Surgeon: Lucy Chris, MD;  Location: [facility];  Service: Neurosurgery;  Laterality: N/A;   BLADDER SURGERY  2012   CARDIAC CATHETERIZATION  2003   [place]   CARDIAC CATHETERIZATION     [place]   CATARACT EXTRACTION W/PHACO Right 02/09/2018   Procedure: CATARACT EXTRACTION PHACO AND INTRAOCULAR LENS PLACEMENT (IOC) RIGHT;  Surgeon: Lockie Mola, MD;  Location: [facility];  Service: Ophthalmology;  Laterality: Right;  sleep apnea   CATARACT EXTRACTION W/PHACO Left 03/09/2018   Procedure: CATARACT EXTRACTION PHACO AND INTRAOCULAR LENS PLACEMENT (IOC);  Surgeon: Lockie Mola, MD;  Location: [facility];  Service: Ophthalmology;  Laterality: Left;  IVA TOPICALLEFT   COLONOSCOPY  2008, 2015   Dr. Servando Snare   COLONOSCOPY N/A 03/05/2022   Procedure: COLONOSCOPY;  Surgeon: Jaynie Collins, DO;  Location: [facility];  Service: Gastroenterology;  Laterality: N/A;   CYSTOSTOMY     ESOPHAGOGASTRODUODENOSCOPY N/A 03/05/2022   Procedure: ESOPHAGOGASTRODUODENOSCOPY (EGD);  Surgeon: Jaynie Collins, DO;  Location: [facility] END[facility];  Service: Gastroenterology;  Laterality: N/A;   KNEE SURGERY  08/18/2011   arthroscopic right   LUMBAR FUSION     L4-5, S-1   NECK SURGERY  2003   TOTAL KNEE ARTHROPLASTY Right 04/29/2015   Procedure: TOTAL KNEE ARTHROPLASTY;  Surgeon: Erin Sons, MD;  Location: [facility];  Service: Orthopedics;  Laterality: Right;   TUBAL LIGATION      Family History  Problem Relation Age of Onset   Kidney disease Mother    Hypertension Mother    CAD Father    Healthy Sister    Kidney disease Son    Kidney cancer Neg Hx    Bladder Cancer Neg Hx     Social History   Tobacco Use   Smoking status: Never   Smokeless tobacco: Never   Tobacco comments:    smoking cessation materials not required  Substance Use Topics   Alcohol use: No    Alcohol/week: 0.0 standard drinks of alcohol     Current Outpatient Medications:  acetaminophen (TYLENOL) 650 MG CR tablet, Take 650 mg by mouth daily as needed for pain., Disp: , Rfl:    Ascorbic Acid (VITAMIN C WITH ROSE HIPS) 1000 MG tablet, Take 1,000 mg by mouth daily. With Zinc, Disp: , Rfl:    aspirin EC 81 MG tablet, Take 81 mg by mouth daily. Swallow whole., Disp: , Rfl:    baclofen (LIORESAL) 10 MG tablet, Take 1 tablet (10 mg total) by mouth at bedtime as needed for muscle spasms., Disp: 90 tablet, Rfl: 1   Bempedoic Acid-Ezetimibe (NEXLIZET) 180-10 MG TABS, Take 1 tablet by mouth daily at 12 noon., Disp: 90 tablet, Rfl: 1   Cholecalciferol (VITAMIN D3) 1000 UNITS CAPS, Take 1,000 Units by mouth every morning. , Disp: , Rfl:    denosumab (PROLIA) 60 MG/ML SOSY injection, Inject 60 mg into the skin every 6 (six) months., Disp: , Rfl:    diclofenac Sodium (VOLTAREN) 1 % GEL, Apply 4 g topically 4 (four) times daily., Disp: 100 g, Rfl: 1   EPINEPHrine (EPI-PEN) 0.3 mg/0.3 mL DEVI, Inject 0.3 mg into the muscle as needed (anaphylaxis)., Disp: , Rfl:    esomeprazole (NEXIUM) 40 MG capsule, Take 1  capsule (40 mg total) by mouth daily., Disp: 90 capsule, Rfl: 1   fluticasone (FLONASE) 50 MCG/ACT nasal spray, Place 2 sprays into both nostrils daily., Disp: 48 g, Rfl: 1   Multiple Vitamins-Minerals (CENTRUM SILVER PO), Take 1 tablet by mouth every morning. , Disp: , Rfl:    nitroGLYCERIN (NITROSTAT) 0.4 MG SL tablet, Place 1 tablet (0.4 mg total) under the tongue every 5 (five) minutes as needed for chest pain., Disp: 25 tablet, Rfl: 0   Olmesartan-amLODIPine-HCTZ 40-10-12.5 MG TABS, Take 1 tablet by mouth daily., Disp: 90 tablet, Rfl: 3   polyethylene glycol (MIRALAX / GLYCOLAX) 17 g packet, Take 17 g by mouth daily., Disp: 14 each, Rfl: 0   QUEtiapine (SEROQUEL) 25 MG tablet, Take 1 tablet (25 mg total) by mouth at bedtime. For sleep, Disp: 90 tablet, Rfl: 0   tolterodine (DETROL) 2 MG tablet, Take 1 tablet (2 mg total) by mouth every evening. For bladder, Disp: 90 tablet, Rfl: 0  Allergies  Allergen Reactions   Niaspan [Niacin Er (Antihyperlipidemic)] Other (See Comments)    Patient states she can't remember the reaction because it so long ago.   Oxycodone-Acetaminophen Nausea Only   Statins Other (See Comments)    Muscle and joint pain.    I personally reviewed active problem list, medication list, allergies, family history with the patient/caregiver today.   ROS  Ten systems reviewed and is negative except as mentioned in HPI    Objective  Vitals:   12/29/23 0753  BP: 122/70  Pulse: 71  Resp: 16  Temp: 98.4 F (36.9 C)  TempSrc: Oral  SpO2: 98%  Weight: 184 lb 4.8 oz (83.6 kg)  Height: 5\' 2"  (1.575 m)    Body mass index is 33.71 kg/m.  Physical Exam  Constitutional: Patient appears well-developed and well-nourished.  No distress.  HEENT: head atraumatic, normocephalic, pupils equal and reactive to light, neck supple Cardiovascular: Normal rate, regular rhythm and normal heart sounds.  No murmur heard. No BLE edema. Pulmonary/Chest: Effort normal and breath  sounds normal. No respiratory distress. Abdominal: Soft.  There is no tenderness. Muscular skeletal: mild tenderness right pectoralis muscle, mild pain with abduction of right shoulder Psychiatric: Patient has a normal mood and affect. behavior is normal. Judgment and thought content normal.  Recent Results (from the past 2160 hours)  HM MAMMOGRAPHY     Status: None   Collection Time: 11/01/23 12:00 AM  Result Value Ref Range   HM Mammogram 0-4 Bi-Rad 0-4 Bi-Rad, Self Reported Normal    Comment: [facility]    Diabetic Foot Exam:     PHQ2/9:    09/28/2023    7:51 AM 07/19/2023    9:43 AM 06/09/2023    9:22 AM 05/24/2023    9:59 AM 05/24/2023    9:54 AM  Depression screen PHQ 2/9  Decreased Interest 0 0 0 0 0  Down, Depressed, Hopeless 0 0 0 0 0  PHQ - 2 Score 0 0 0 0 0  Altered sleeping 0  0 0   Tired, decreased energy 0  0 0   Change in appetite 0  0 0   Feeling bad or failure about yourself  0  0 0   Trouble concentrating 0  0 0   Moving slowly or fidgety/restless 0  0 0   Suicidal thoughts 0  0 0   PHQ-9 Score 0  0 0     phq 9 is negative  Fall Risk:    12/29/2023    7:46 AM 09/28/2023    7:50 AM 07/19/2023    9:42 AM 06/09/2023    9:21 AM 05/24/2023    9:59 AM  Fall Risk   Falls in the past year? 0 0 0 0 0  Number falls in past yr: 0 0 0    Injury with Fall? 0 0 0    Risk for fall due to : No Fall Risks No Fall Risks No Fall Risks No Fall Risks No Fall Risks  Follow up Falls prevention discussed;Education provided;Falls evaluation completed Falls prevention discussed  Falls prevention discussed Falls prevention discussed;Education provided;Falls evaluation completed     Assessment & Plan  1. Stage 3a chronic kidney disease (HCC) (Primary)  Stable, up to date with visits with nephrologist   2. Aortic atherosclerosis (HCC)  On Nexlizet   3. Atherosclerosis of native coronary artery of native heart with stable angina pectoris (HCC)  Taking Nexlizet , bp is at  goal, seeing cardiologist , no recent episodes of chest pain  4. Secondary hyperparathyroidism of renal origin (HCC)  Monitored by nephrologist   5. Chronic kidney disease, stage 3b [facility])  Seeing nephrologist   6. Obstructive sleep apnea on CPAP  Compliant with CPAP  7. Statin myopathy  Tolerating Nexlizet   8. Essential hypertension  bP is at goal   9. Chronic insomnia  - QUEtiapine (SEROQUEL) 25 MG tablet; Take 1 tablet (25 mg total) by mouth at bedtime. For sleep  Dispense: 90 tablet; Refill: 1  10. Nocturia  - tolterodine (DETROL) 2 MG tablet; Take 1 tablet (2 mg total) by mouth every evening. For bladder  Dispense: 90 tablet; Refill: 1

## 2024-01-13 ENCOUNTER — Ambulatory Visit
Admission: EM | Admit: 2024-01-13 | Discharge: 2024-01-13 | Disposition: A | Discharge status: Home / Self Care | Attending: Emergency Medicine | Admitting: Emergency Medicine

## 2024-01-13 DIAGNOSIS — S61211A Laceration without foreign body of left index finger without damage to nail, initial encounter: Secondary | ICD-10-CM | POA: Diagnosis not present

## 2024-01-13 MED ORDER — CEPHALEXIN 500 MG PO CAPS: 500.0000 mg | ORAL_CAPSULE | Freq: Three times a day (TID) | ORAL | 0 refills | Status: AC

## 2024-01-13 NOTE — Discharge Instructions (Addendum)
Leave the dressing in place for the next 24 hours.  After 24 hours remove the dressing, wash the wound with warm water and soap, pat it dry, and apply a thin smear of bacitracin.  Clean the wound daily and apply bacitracin for the first 2 days.  After that a scab should have started to form and you can leave the wound open to air when you are at home and cover with a Band-Aid when you go out in public.  Take the Keflex three times daily for 5 days for prevention of wound infection.  Sutures remain in place for 10 days, please return here or see your primary care provider for removal.  If you develop any redness at the wound site, swelling, pain, drainage, red streaks going up your hand, or fever please return for reevaluation.  

## 2024-01-13 NOTE — ED Provider Notes (Addendum)
 [facility]    CSN: [id number] Arrival date & time: 01/13/24  1059      History   Chief Complaint Chief Complaint  Patient presents with   Laceration    HPI Jodi Kirby is a 78 y.o. Jodi Kirby.   HPI  78 year old Jodi Kirby with past medical history significant for MI, hyperlipidemia, essential hypertension, CAD, reflux disease, and OSA presents for evaluation of laceration to the lateral aspect of the left index finger across the PIP joint.  No numbness or tingling in the finger and patient has full range of motion.  Patient's last Tdap was 05/25/2022 per epic.  Past Medical History:  Diagnosis Date   Allergic rhinitis, cause unspecified    Allergy    Anxiety    Chronic kidney disease    Coronary artery disease    DDD (degenerative disc disease), lumbar    Dysmetabolic syndrome X    Dysuria    Esophageal reflux    Essential hypertension, benign    HNP (herniated nucleus pulposus), lumbar    Dr. Yves Dill [facility])   Hyperlipidemia    Lumbago    Lumbar radiculitis    Dr. Yves Dill [facility])   Lump or mass in breast    LEFT BREAST   Myalgia and myositis, unspecified    Myocardial infarction (HCC)    Obstructive sleep apnea    CPAP   Osteoarthrosis, unspecified whether generalized or localized, lower leg    Other abnormal glucose    Other and unspecified angina pectoris    Personal history of malignant neoplasm of other endocrine glands and related structures    Toxic effect of venom(989.5)    Unspecified iridocyclitis    Unspecified vitamin D deficiency    Vertigo 2018   1 episode    Patient Active Problem List   Diagnosis Date Noted   Statin myopathy 06/09/2023   Muscle spasm 06/09/2023   Vitamin D deficiency 04/27/2022   Stage 3a chronic kidney disease (HCC) 04/27/2022   Vitamin B12 deficiency 12/29/2021   Anemia of chronic disease 11/19/2021   Lumbar stenosis 12/16/2020   Benign hypertension with chronic kidney disease, stage III (HCC) 12/22/2018    History of lumbar fusion 07/19/2018   DDD (degenerative disc disease), lumbosacral 07/19/2018   Obesity (BMI 30.0-34.9) 07/19/2018   Mild cognitive impairment 12/21/2017   Aortic atherosclerosis (HCC) 05/15/2017   Impingement syndrome of left shoulder 05/12/2016   Chronic insomnia 11/08/2015   Angina pectoris (HCC) 07/10/2015   Seasonal allergic rhinitis 07/09/2015   Gastroesophageal reflux disease without esophagitis 07/09/2015   Bee sting allergy 07/09/2015   History of knee replacement procedure of right knee 04/29/2015   History of colonic polyps 09/24/2014   Fibrocystic breast 09/24/2014   Dyslipidemia 02/09/2013   Atherosclerosis of native coronary artery of native heart with stable angina pectoris (HCC) 02/09/2013   Obstructive sleep apnea on CPAP 02/09/2013   Hypertension 02/09/2013    Past Surgical History:  Procedure Laterality Date   ABDOMINAL HYSTERECTOMY  1970   ANKLE SURGERY Right 1980   ANTERIOR LATERAL LUMBAR FUSION WITH PERCUTANEOUS SCREW 2 LEVEL N/A 12/16/2020   Procedure: L2/3, L3/4 LATERAL LUMBAR FUSION, L2-4 PEDICLE SCREW FIXATION;  Surgeon: Lucy Chris, MD;  Location: [facility] ORS;  Service: Neurosurgery;  Laterality: N/A;   BLADDER SURGERY  2012   CARDIAC CATHETERIZATION  2003   [place]   CARDIAC CATHETERIZATION     [place]   CATARACT EXTRACTION W/PHACO Right 02/09/2018   Procedure: CATARACT EXTRACTION PHACO AND INTRAOCULAR LENS  PLACEMENT (IOC) RIGHT;  Surgeon: Lockie Mola, MD;  Location: [facility];  Service: Ophthalmology;  Laterality: Right;  sleep apnea   CATARACT EXTRACTION W/PHACO Left 03/09/2018   Procedure: CATARACT EXTRACTION PHACO AND INTRAOCULAR LENS PLACEMENT (IOC);  Surgeon: Lockie Mola, MD;  Location: [facility];  Service: Ophthalmology;  Laterality: Left;  IVA TOPICALLEFT   COLONOSCOPY  2008, 2015   Dr. Servando Snare   COLONOSCOPY N/A 03/05/2022   Procedure: COLONOSCOPY;  Surgeon: Jaynie Collins, DO;  Location:  [facility] END[facility]COPY;  Service: Gastroenterology;  Laterality: N/A;   CYSTOSTOMY     ESOPHAGOGASTRODUODENOSCOPY N/A 03/05/2022   Procedure: ESOPHAGOGASTRODUODENOSCOPY (EGD);  Surgeon: Jaynie Collins, DO;  Location: [facility] END[facility]COPY;  Service: Gastroenterology;  Laterality: N/A;   KNEE SURGERY  08/18/2011   arthroscopic right   LUMBAR FUSION     L4-5, S-1   NECK SURGERY  2003   TOTAL KNEE ARTHROPLASTY Right 04/29/2015   Procedure: TOTAL KNEE ARTHROPLASTY;  Surgeon: Erin Sons, MD;  Location: [facility] ORS;  Service: Orthopedics;  Laterality: Right;   TUBAL LIGATION      OB History     Gravida  4   Para  4   Term      Preterm      AB  0   Living  4      SAB      IAB      Ectopic      Multiple      Live Births           Obstetric Comments  Age with first menstruation-13 Age with first pregnancy16 LMP-1970, hysterectomy          Home Medications    Prior to Admission medications   Medication Sig Start Date End Date Taking? Authorizing Provider  acetaminophen (TYLENOL) 650 MG CR tablet Take 650 mg by mouth daily as needed for pain.   Yes [provider]  Ascorbic Acid (VITAMIN C WITH ROSE HIPS) 1000 MG tablet Take 1,000 mg by mouth daily. With Zinc   Yes [provider]  aspirin EC 81 MG tablet Take 81 mg by mouth daily. Swallow whole.   Yes [provider]  baclofen (LIORESAL) 10 MG tablet Take 1 tablet (10 mg total) by mouth at bedtime as needed for muscle spasms. 06/09/23  Yes Sowles, Danna Hefty, MD  Bempedoic Acid-Ezetimibe (NEXLIZET) 180-10 MG TABS Take 1 tablet by mouth daily at 12 noon. 09/28/23  Yes Sowles, Danna Hefty, MD  cephALEXin (KEFLEX) 500 MG capsule Take 1 capsule (500 mg total) by mouth 3 (three) times daily for 5 days. 01/13/24 01/18/24 Yes Becky Augusta, NP  Cholecalciferol (VITAMIN D3) 1000 UNITS CAPS Take 1,000 Units by mouth every morning.    Yes [provider]  denosumab (PROLIA) 60 MG/ML SOSY injection Inject 60  mg into the skin every 6 (six) months.   Yes Sherlon Handing, MD  EPINEPHrine (EPI-PEN) 0.3 mg/0.3 mL DEVI Inject 0.3 mg into the muscle as needed (anaphylaxis).   Yes [provider]  esomeprazole (NEXIUM) 40 MG capsule Take 1 capsule (40 mg total) by mouth daily. 09/28/23  Yes Sowles, Danna Hefty, MD  fluticasone (FLONASE) 50 MCG/ACT nasal spray Place 2 sprays into both nostrils daily. 03/01/23  Yes Sowles, Danna Hefty, MD  Multiple Vitamins-Minerals (CENTRUM SILVER PO) Take 1 tablet by mouth every morning.    Yes [provider]  nitroGLYCERIN (NITROSTAT) 0.4 MG SL tablet Place 1 tablet (0.4 mg total) under the tongue every 5 (five)  minutes as needed for chest pain. 10/20/21  Yes Gollan, Tollie Pizza, MD  Olmesartan-amLODIPine-HCTZ 40-10-12.5 MG TABS Take 1 tablet by mouth daily. 12/10/23  Yes Gollan, Tollie Pizza, MD  polyethylene glycol (MIRALAX / GLYCOLAX) 17 g packet Take 17 g by mouth daily. 12/18/20  Yes Lucy Chris, MD  QUEtiapine (SEROQUEL) 25 MG tablet Take 1 tablet (25 mg total) by mouth at bedtime. For sleep 12/29/23  Yes Sowles, Danna Hefty, MD  tolterodine (DETROL) 2 MG tablet Take 1 tablet (2 mg total) by mouth every evening. For bladder 12/29/23  Yes Alba Cory, MD    Family History Family History  Problem Relation Age of Onset   Kidney disease Mother    Hypertension Mother    CAD Father    Healthy Sister    Kidney disease Son    Kidney cancer Neg Hx    Bladder Cancer Neg Hx     Social History Social History   Tobacco Use   Smoking status: Never   Smokeless tobacco: Never   Tobacco comments:    smoking cessation materials not required  Vaping Use   Vaping status: Never Used  Substance Use Topics   Alcohol use: No    Alcohol/week: 0.0 standard drinks of alcohol   Drug use: No     Allergies   Niaspan [niacin er (antihyperlipidemic)], Oxycodone-acetaminophen, and Statins   Review of Systems Review of Systems  Skin:  Positive for wound.  Neurological:   Negative for numbness.     Physical Exam Triage Vital Signs ED Triage Vitals  Encounter Vitals Group     BP      Systolic BP Percentile      Diastolic BP Percentile      Pulse      Resp      Temp      Temp src      SpO2      Weight      Height      Head Circumference      Peak Flow      Pain Score      Pain Loc      Pain Education      Exclude from Growth Chart    No data found.  Updated Vital Signs BP (!) (P) 133/59 (BP Location: Right Arm)   Pulse (!) 59   Temp 98.2 F (36.8 C) (Oral)   Resp 20   Ht 5\' 2"  (1.575 m)   Wt 180 lb (81.6 kg)   SpO2 100%   BMI 32.92 kg/m   Visual Acuity Right Eye Distance:   Left Eye Distance:   Bilateral Distance:    Right Eye Near:   Left Eye Near:    Bilateral Near:     Physical Exam Vitals and nursing note reviewed.  Constitutional:      Appearance: Normal appearance.  Musculoskeletal:        General: Tenderness and signs of injury present. No swelling or deformity. Normal range of motion.  Skin:    General: Skin is warm and dry.     Capillary Refill: Capillary refill takes less than 2 seconds.     Findings: No bruising or erythema.  Neurological:     General: No focal deficit present.     Mental Status: Jodi Kirby is alert and oriented to person, place, and time.      UC Treatments / Results  Labs (all labs ordered are listed, but only abnormal results are displayed) Labs Reviewed - No data  to display  EKG   Radiology No results found.  Procedures Procedures (including critical care time)  Medications Ordered in UC Medications - No data to display  Initial Impression / Assessment and Plan / UC Course  I have reviewed the triage vital signs and the nursing notes.  Pertinent labs & imaging results that were available during my care of the patient were reviewed by me and considered in my medical decision making (see chart for details).   Patient is a pleasant, nontoxic-appearing 78 year old Jodi Kirby  presenting for evaluation of a laceration to her left index finger as outlined HPI above.  As you can see in image above, the laceration extends medially across the PIP joint.  Resisted flexion extension of the finger is intact.  No active bleeding.  I did inspect the wound and did not appreciate any tendon injury or foreign body.  The wound was anesthetized with 1.25 mL of 1% lidocaine without epi.  Once good anesthesia was achieved the wound was cleansed with chlorhexidine saline, draped in a sterile fashion, and closed with 3 simple erupted sutures of 5-0 Prolene.  Afterwards, the wound was again cleansed with chlorhexidine and saline, patted dry, and dressed with bacitracin, nonadherent dressing, and Coban.  Patient tolerated procedure well.  I will discharge patient home with a diagnosis of left index finger laceration on Keflex 500 mg 3 times daily x 5 days to prevent infection.  Wound management discussed, as well as signs and symptoms of infection.  Patient will return in 10 days for suture removal.  Final Clinical Impressions(s) / UC Diagnoses   Final diagnoses:  Laceration of left index finger without foreign body without damage to nail, initial encounter     Discharge Instructions      Leave the dressing in place for the next 24 hours.  After 24 hours remove the dressing, wash the wound with warm water and soap, pat it dry, and apply a thin smear of bacitracin.  Clean the wound daily and apply bacitracin for the first 2 days.  After that a scab should have started to form and you can leave the wound open to air when you are at home and cover with a Band-Aid when you go out in public.  Take the Keflex three times daily for 5 days for prevention of wound infection.  Sutures remain in place for 10 days, please return here or see your primary care provider for removal.  If you develop any redness at the wound site, swelling, pain, drainage, red streaks going up your hand, or fever  please return for reevaluation.      ED Prescriptions     Medication Sig Dispense Auth. Provider   cephALEXin (KEFLEX) 500 MG capsule Take 1 capsule (500 mg total) by mouth 3 (three) times daily for 5 days. 15 capsule Becky Augusta, NP      PDMP not reviewed this encounter.   Becky Augusta, NP 01/13/24 1307    Becky Augusta, NP 01/13/24 [phone]

## 2024-03-02 LAB — HM DIABETES EYE EXAM

## 2024-03-21 ENCOUNTER — Other Ambulatory Visit: Payer: Self-pay | Admitting: Family Medicine

## 2024-03-21 DIAGNOSIS — K219 Gastro-esophageal reflux disease without esophagitis: Secondary | ICD-10-CM

## 2024-03-21 NOTE — Telephone Encounter (Signed)
 Copied from CRM [id number]. Topic: Clinical - Medication Refill >> Mar 21, 2024  3:58 PM Fonda T wrote: Medication:  esomeprazole  (NEXIUM ) 40 MG capsule Patient is requesting 90 day supply  Has the patient contacted their pharmacy? Yes (Agent: If no, request that the patient contact the pharmacy for the refill. If patient does not wish to contact the pharmacy document the reason why and proceed with request.) (Agent: If yes, when and what did the pharmacy advise?)  This is the patient's preferred pharmacy:  [facility] [address] [place] - [address] [place] [address] Phone: [phone] Fax: [phone]  Is this the correct pharmacy for this prescription? Yes If no, delete pharmacy and type the correct one.   Has the prescription been filled recently? Yes  Is the patient out of the medication? No Only have 1-2 more capsules  Has the patient been seen for an appointment in the last year OR does the patient have an upcoming appointment? Yes  Can we respond through MyChart? Yes  Agent: Please be advised that Rx refills may take up to 3 business days. We ask that you follow-up with your pharmacy.

## 2024-03-22 ENCOUNTER — Other Ambulatory Visit: Payer: Self-pay | Admitting: Family Medicine

## 2024-03-22 DIAGNOSIS — K219 Gastro-esophageal reflux disease without esophagitis: Secondary | ICD-10-CM

## 2024-03-23 NOTE — Telephone Encounter (Signed)
 Requested Prescriptions  Refused Prescriptions Disp Refills   esomeprazole  (NEXIUM ) 40 MG capsule 90 capsule 1    Sig: Take 1 capsule (40 mg total) by mouth daily.     Gastroenterology: Proton Pump Inhibitors 2 Passed - 03/23/2024 10:58 AM      Passed - ALT in normal range and within 360 days    ALT  Date Value Ref Range Status  09/28/2023 12 6 - 29 U/L Final   SGPT (ALT)  Date Value Ref Range Status  03/18/2014 24 12 - 78 U/L Final         Passed - AST in normal range and within 360 days    AST  Date Value Ref Range Status  09/28/2023 12 10 - 35 U/L Final   SGOT(AST)  Date Value Ref Range Status  03/18/2014 26 15 - 37 Unit/L Final         Passed - Valid encounter within last 12 months    Recent Outpatient Visits           2 months ago Stage 3a chronic kidney disease [facility])   [place] [facility] Arleen Lacer, MD       Future Appointments             In 3 months Ava Lei, Krichna, MD [facility], [facility]

## 2024-03-26 ENCOUNTER — Other Ambulatory Visit: Payer: Self-pay | Admitting: Family Medicine

## 2024-03-26 DIAGNOSIS — I25118 Atherosclerotic heart disease of native coronary artery with other forms of angina pectoris: Secondary | ICD-10-CM

## 2024-03-26 DIAGNOSIS — I700 Atherosclerosis of aorta: Secondary | ICD-10-CM

## 2024-04-12 ENCOUNTER — Other Ambulatory Visit: Payer: Self-pay | Admitting: Family Medicine

## 2024-04-12 DIAGNOSIS — J3089 Other allergic rhinitis: Secondary | ICD-10-CM

## 2024-06-15 ENCOUNTER — Other Ambulatory Visit: Payer: Self-pay | Admitting: Family Medicine

## 2024-06-15 DIAGNOSIS — K219 Gastro-esophageal reflux disease without esophagitis: Secondary | ICD-10-CM

## 2024-06-22 ENCOUNTER — Other Ambulatory Visit: Payer: Self-pay | Admitting: Family Medicine

## 2024-06-22 DIAGNOSIS — F5104 Psychophysiologic insomnia: Secondary | ICD-10-CM

## 2024-06-27 ENCOUNTER — Encounter: Payer: Self-pay | Admitting: Family Medicine

## 2024-06-27 ENCOUNTER — Ambulatory Visit (INDEPENDENT_AMBULATORY_CARE_PROVIDER_SITE_OTHER): Payer: Medicare PPO | Admitting: Family Medicine

## 2024-06-27 VITALS — BP 136/68 | HR 76 | Resp 16 | Ht 62.0 in | Wt 183.1 lb

## 2024-06-27 DIAGNOSIS — N1832 Chronic kidney disease, stage 3b: Secondary | ICD-10-CM

## 2024-06-27 DIAGNOSIS — I700 Atherosclerosis of aorta: Secondary | ICD-10-CM | POA: Diagnosis not present

## 2024-06-27 DIAGNOSIS — I10 Essential (primary) hypertension: Secondary | ICD-10-CM | POA: Diagnosis not present

## 2024-06-27 DIAGNOSIS — I25118 Atherosclerotic heart disease of native coronary artery with other forms of angina pectoris: Secondary | ICD-10-CM | POA: Diagnosis not present

## 2024-06-27 DIAGNOSIS — Z1159 Encounter for screening for other viral diseases: Secondary | ICD-10-CM | POA: Diagnosis not present

## 2024-06-27 DIAGNOSIS — K219 Gastro-esophageal reflux disease without esophagitis: Secondary | ICD-10-CM | POA: Diagnosis not present

## 2024-06-27 DIAGNOSIS — G4733 Obstructive sleep apnea (adult) (pediatric): Secondary | ICD-10-CM

## 2024-06-27 DIAGNOSIS — F5104 Psychophysiologic insomnia: Secondary | ICD-10-CM | POA: Diagnosis not present

## 2024-06-27 DIAGNOSIS — G720 Drug-induced myopathy: Secondary | ICD-10-CM | POA: Diagnosis not present

## 2024-06-27 DIAGNOSIS — D638 Anemia in other chronic diseases classified elsewhere: Secondary | ICD-10-CM

## 2024-06-27 DIAGNOSIS — M810 Age-related osteoporosis without current pathological fracture: Secondary | ICD-10-CM

## 2024-06-27 DIAGNOSIS — T466X5A Adverse effect of antihyperlipidemic and antiarteriosclerotic drugs, initial encounter: Secondary | ICD-10-CM

## 2024-06-27 MED ORDER — QUETIAPINE FUMARATE 25 MG PO TABS: 25.0000 mg | ORAL_TABLET | Freq: Every day | ORAL | 1 refills | Status: AC

## 2024-06-27 MED ORDER — ESOMEPRAZOLE MAGNESIUM 40 MG PO CPDR
40.0000 mg | DELAYED_RELEASE_CAPSULE | Freq: Every day | ORAL | 1 refills | Status: AC
Start: 2024-06-27 — End: ?

## 2024-06-27 MED ORDER — EZETIMIBE 10 MG PO TABS
10.0000 mg | ORAL_TABLET | Freq: Every day | ORAL | 3 refills | Status: AC
Start: 2024-06-27 — End: ?

## 2024-06-27 NOTE — Progress Notes (Signed)
 Name: Jodi Kirby   MRN: [id number]    DOB: 11/22/1945   Date:06/27/2024       Progress Note  Subjective  Chief Complaint  Chief Complaint  Patient presents with   Medical Management of Chronic Issues   Discussed the use of AI scribe software for clinical note transcription with the patient, who gave verbal consent to proceed.  History of Present Illness Jodi Kirby is a 78 year old female with chronic kidney disease and aortic atherosclerosis who presents for a regular follow-up visit.  She experiences frequent cramps, particularly in her hands and feet, which she suspects may be related to her medication, possibly Nexlizet . The cramps can be severe, occasionally preventing movement of her hand. Her chronic kidney disease is currently at stage 3B, and her parathyroid  levels were normal at the last check. She also has anemia related to her chronic disease.  She has  aortic atherosclerosis and stable angina, experiencing chest pain with exertion, though it is infrequent. The chest pain sometimes improves with burping. She cannot tolerate statins due to muscle pain and has previously tried Zetia , which was not effective enough.  Her hypertension is managed with olmesartan , amlodipine , and hydrochlorothiazide , with no reported issues. No swelling  or shortness of breath related to her blood pressure medications.  For her gastroesophageal reflux disease (GERD), she takes esomeprazole , which effectively controls her heartburn and acid reflux.  She uses a CPAP machine nightly for obstructive sleep apnea and is compliant with its use.   She was prescribed Detrol  for urinary urgency but has stopped taking it, feeling better without it. She reports nocturia but is able to fall back asleep.  She takes baclofen  as needed for muscle spasms and receives Prolia  injections for osteoporosis without any issues.    Patient Active Problem List   Diagnosis Date Noted   Statin myopathy  06/09/2023   Muscle spasm 06/09/2023   Vitamin D  deficiency 04/27/2022   Stage 3a chronic kidney disease (HCC) 04/27/2022   Vitamin B12 deficiency 12/29/2021   Anemia of chronic disease 11/19/2021   Lumbar stenosis 12/16/2020   Benign hypertension with chronic kidney disease, stage III (HCC) 12/22/2018   History of lumbar fusion 07/19/2018   DDD (degenerative disc disease), lumbosacral 07/19/2018   Obesity (BMI 30.0-34.9) 07/19/2018   Mild cognitive impairment 12/21/2017   Aortic atherosclerosis (HCC) 05/15/2017   Impingement syndrome of left shoulder 05/12/2016   Chronic insomnia 11/08/2015   Angina pectoris (HCC) 07/10/2015   Seasonal allergic rhinitis 07/09/2015   Gastroesophageal reflux disease without esophagitis 07/09/2015   Bee sting allergy 07/09/2015   History of knee replacement procedure of right knee 04/29/2015   History of colonic polyps 09/24/2014   Fibrocystic breast 09/24/2014   Dyslipidemia 02/09/2013   Atherosclerosis of native coronary artery of native heart with stable angina pectoris (HCC) 02/09/2013   Obstructive sleep apnea on CPAP 02/09/2013   Hypertension 02/09/2013    Past Surgical History:  Procedure Laterality Date   ABDOMINAL HYSTERECTOMY  1970   ANKLE SURGERY Right 1980   ANTERIOR LATERAL LUMBAR FUSION WITH PERCUTANEOUS SCREW 2 LEVEL N/A 12/16/2020   Procedure: L2/3, L3/4 LATERAL LUMBAR FUSION, L2-4 PEDICLE SCREW FIXATION;  Surgeon: Bluford Standing, MD;  Location: [facility];  Service: Neurosurgery;  Laterality: N/A;   BLADDER SURGERY  2012   CARDIAC CATHETERIZATION  2003   [place]   CARDIAC CATHETERIZATION     [place]   CATARACT EXTRACTION W/PHACO Right 02/09/2018   Procedure: CATARACT EXTRACTION PHACO AND  INTRAOCULAR LENS PLACEMENT (IOC) RIGHT;  Surgeon: Mittie Gaskin, MD;  Location: [facility];  Service: Ophthalmology;  Laterality: Right;  sleep apnea   CATARACT EXTRACTION W/PHACO Left 03/09/2018   Procedure: CATARACT EXTRACTION  PHACO AND INTRAOCULAR LENS PLACEMENT (IOC);  Surgeon: Mittie Gaskin, MD;  Location: [facility];  Service: Ophthalmology;  Laterality: Left;  IVA TOPICALLEFT   COLONOSCOPY  2008, 2015   Dr. Jinny   COLONOSCOPY N/A 03/05/2022   Procedure: COLONOSCOPY;  Surgeon: Onita Elspeth Sharper, DO;  Location: [facility];  Service: Gastroenterology;  Laterality: N/A;   CYSTOSTOMY     ESOPHAGOGASTRODUODENOSCOPY N/A 03/05/2022   Procedure: ESOPHAGOGASTRODUODENOSCOPY (EGD);  Surgeon: Onita Elspeth Sharper, DO;  Location: [facility];  Service: Gastroenterology;  Laterality: N/A;   KNEE SURGERY  08/18/2011   arthroscopic right   LUMBAR FUSION     L4-5, S-1   NECK SURGERY  2003   TOTAL KNEE ARTHROPLASTY Right 04/29/2015   Procedure: TOTAL KNEE ARTHROPLASTY;  Surgeon: Helayne Glenn, MD;  Location: [facility] OR[facility];  Service: Orthopedics;  Laterality: Right;   TUBAL LIGATION      Family History  Problem Relation Age of Onset   Kidney disease Mother    Hypertension Mother    CAD Father    Healthy Sister    Kidney disease Son    Kidney cancer Neg Hx    Bladder Cancer Neg Hx     Social History   Tobacco Use   Smoking status: Never   Smokeless tobacco: Never   Tobacco comments:    smoking cessation materials not required  Substance Use Topics   Alcohol use: No    Alcohol/week: 0.0 standard drinks of alcohol     Current Outpatient Medications:    acetaminophen  (TYLENOL ) 650 MG CR tablet, Take 650 mg by mouth daily as needed for pain., Disp: , Rfl:    Ascorbic Acid  (VITAMIN C WITH ROSE HIPS) 1000 MG tablet, Take 1,000 mg by mouth daily. With Zinc, Disp: , Rfl:    aspirin  EC 81 MG tablet, Take 81 mg by mouth daily. Swallow whole., Disp: , Rfl:    baclofen  (LIORESAL ) 10 MG tablet, Take 1 tablet (10 mg total) by mouth at bedtime as needed for muscle spasms., Disp: 90 tablet, Rfl: 1   calcitRIOL (ROCALTROL) 0.25 MCG capsule, Take 0.25 mcg by mouth daily., Disp: , Rfl:    Cholecalciferol   (VITAMIN D3) 1000 UNITS CAPS, Take 1,000 Units by mouth every morning. , Disp: , Rfl:    denosumab  (PROLIA ) 60 MG/ML SOSY injection, Inject 60 mg into the skin every 6 (six) months., Disp: , Rfl:    EPINEPHrine  (EPI-PEN) 0.3 mg/0.3 mL DEVI, Inject 0.3 mg into the muscle as needed (anaphylaxis)., Disp: , Rfl:    esomeprazole  (NEXIUM ) 40 MG capsule, Take 1 capsule by mouth once daily, Disp: 30 capsule, Rfl: 0   fluticasone  (FLONASE ) 50 MCG/ACT nasal spray, Use 2 spray(s) in each nostril once daily, Disp: 48 g, Rfl: 0   Multiple Vitamins-Minerals (CENTRUM SILVER PO), Take 1 tablet by mouth every morning. , Disp: , Rfl:    NEXLIZET  180-10 MG TABS, TAKE 1 TABLET BY MOUTH ONCE DAILY AT  12PM, Disp: 90 tablet, Rfl: 0   nitroGLYCERIN  (NITROSTAT ) 0.4 MG SL tablet, Place 1 tablet (0.4 mg total) under the tongue every 5 (five) minutes as needed for chest pain., Disp: 25 tablet, Rfl: 0   Olmesartan -amLODIPine -HCTZ 40-10-12.5 MG TABS, Take 1 tablet by mouth daily., Disp: 90 tablet, Rfl: 3  polyethylene glycol (MIRALAX  / GLYCOLAX ) 17 g packet, Take 17 g by mouth daily., Disp: 14 each, Rfl: 0   QUEtiapine  (SEROQUEL ) 25 MG tablet, Take 1 tablet (25 mg total) by mouth at bedtime. For sleep, Disp: 90 tablet, Rfl: 1   tolterodine  (DETROL ) 2 MG tablet, Take 1 tablet (2 mg total) by mouth every evening. For bladder, Disp: 90 tablet, Rfl: 1  Allergies  Allergen Reactions   Niaspan [Niacin Er (Antihyperlipidemic)] Other (See Comments)    Patient states she can't remember the reaction because it so long ago.   Oxycodone -Acetaminophen  Nausea Only   Statins Other (See Comments)    Muscle and joint pain.    I personally reviewed active problem list, medication list, allergies, family history with the patient/caregiver today.   ROS  Ten systems reviewed and is negative except as mentioned in HPI    Objective Physical Exam CONSTITUTIONAL: Patient appears well-developed and well-nourished. No distress. HEENT:  Head atraumatic, normocephalic, neck supple. CARDIOVASCULAR: Normal rate, regular rhythm, and normal heart sounds. No murmur heard. No extremity swelling. PULMONARY: Effort normal and breath sounds normal. No respiratory distress. ABDOMINAL: There is no tenderness or distention. MUSCULOSKELETAL: Normal gait. Without gross motor or sensory deficit. PSYCHIATRIC: Patient has a normal mood and affect. Behavior is normal. Judgment and thought content normal.  Vitals:   06/27/24 0845  BP: (!) 142/74  Pulse: 76  Resp: 16  SpO2: 99%  Weight: 183 lb 1.6 oz (83.1 kg)  Height: 5' 2 (1.575 m)    Body mass index is 33.49 kg/m.    PHQ2/9:    06/27/2024    8:37 AM 09/28/2023    7:51 AM 07/19/2023    9:43 AM 06/09/2023    9:22 AM 05/24/2023    9:59 AM  Depression screen PHQ 2/9  Decreased Interest 0 0 0 0 0  Down, Depressed, Hopeless 0 0 0 0 0  PHQ - 2 Score 0 0 0 0 0  Altered sleeping  0  0 0  Tired, decreased energy  0  0 0  Change in appetite  0  0 0  Feeling bad or failure about yourself   0  0 0  Trouble concentrating  0  0 0  Moving slowly or fidgety/restless  0  0 0  Suicidal thoughts  0  0 0  PHQ-9 Score  0  0 0    phq 9 is negative  Fall Risk:    06/27/2024    8:37 AM 12/29/2023    7:46 AM 09/28/2023    7:50 AM 07/19/2023    9:42 AM 06/09/2023    9:21 AM  Fall Risk   Falls in the past year? 0 0 0 0 0  Number falls in past yr: 0 0 0 0   Injury with Fall? 0 0 0 0   Risk for fall due to : No Fall Risks No Fall Risks No Fall Risks No Fall Risks No Fall Risks  Follow up Falls evaluation completed Falls prevention discussed;Education provided;Falls evaluation completed Falls prevention discussed  Falls prevention discussed     Assessment and Plan Assessment & Plan Chronic kidney disease stage 3b with anemia of chronic disease Chronic kidney disease stage 3b is progressing slowly. Anemia monitored by nephrologist. Normal parathyroid  hormone levels. Cramping may be  kidney-related. - Continue monitoring kidney function and anemia with nephrologist. - Advise staying hydrated to help with cramping.  Atherosclerosis of aorta and native coronary artery with stable angina Atherosclerosis with stable  angina on exertion. Nexlizet  was expensive and possibly causing cramps. Statins not tolerated. Zetia  considered as alternative. - Discontinue Nexlizet . - Initiate Zetia  10 mg for cholesterol management. - Continue aspirin  therapy. - Order lipid panel after switching to Zetia  for a couple of months. - Order liver enzyme tests.  Hypertension Hypertension well-controlled with current medication regimen. - Continue current antihypertensive medications: olmesartan , amlodipine , HCTZ.  Gastroesophageal reflux disease (GERD) GERD well-controlled with esomeprazole . - Continue esomeprazole .  Urinary urgency and nocturia Urinary urgency and nocturia present. Detrol  was ineffective and possibly increased morning urination. - Discontinue Detrol .  Obstructive sleep apnea on CPAP therapy Obstructive sleep apnea managed with CPAP therapy. Compliant with nightly use. - Continue CPAP therapy.  Insomnia Insomnia managed with Seroquel   effective in aiding sleep. - Continue Seroquel    Muscle cramps and spasms Muscle cramps and spasms possibly related to kidney dysfunction or medication. - Continue baclofen  as needed for muscle spasms.  Osteoporosis on Prolia  therapy Osteoporosis managed with Prolia  injections. - Continue Prolia  injections.  General Health Maintenance Hepatitis C screening pending. - Order hepatitis C screening. - Plan for flu shot in October.

## 2024-06-28 LAB — HEPATIC FUNCTION PANEL
AG Ratio: 1.7 (calc) (ref 1.0–2.5)
ALT: 20 U/L (ref 6–29)
AST: 21 U/L (ref 10–35)
Albumin: 4.5 g/dL (ref 3.6–5.1)
Alkaline phosphatase (APISO): 47 U/L (ref 37–153)
Bilirubin, Direct: 0.1 mg/dL (ref 0.0–0.2)
Globulin: 2.6 g/dL (ref 1.9–3.7)
Indirect Bilirubin: 0.3 mg/dL (ref 0.2–1.2)
Total Bilirubin: 0.4 mg/dL (ref 0.2–1.2)
Total Protein: 7.1 g/dL (ref 6.1–8.1)

## 2024-06-28 LAB — LIPID PANEL
Cholesterol: 140 mg/dL (ref <200)
HDL: 52 mg/dL (ref >=50)
LDL Cholesterol (Calc): 67 mg/dL
Non-HDL Cholesterol (Calc): 88 mg/dL (ref <130)
Total CHOL/HDL Ratio: 2.7 (calc) (ref <5.0)
Triglycerides: 126 mg/dL (ref <150)

## 2024-06-28 LAB — HEPATITIS C ANTIBODY: Hepatitis C Ab: NONREACTIVE

## 2024-07-01 ENCOUNTER — Ambulatory Visit: Payer: Self-pay | Admitting: Family Medicine

## 2024-07-25 ENCOUNTER — Other Ambulatory Visit: Payer: Self-pay | Admitting: Family Medicine

## 2024-07-25 DIAGNOSIS — R351 Nocturia: Secondary | ICD-10-CM

## 2024-08-01 DIAGNOSIS — N184 Chronic kidney disease, stage 4 (severe): Secondary | ICD-10-CM | POA: Diagnosis not present

## 2024-08-01 DIAGNOSIS — N2581 Secondary hyperparathyroidism of renal origin: Secondary | ICD-10-CM | POA: Diagnosis not present

## 2024-08-01 DIAGNOSIS — I10 Essential (primary) hypertension: Secondary | ICD-10-CM | POA: Diagnosis not present

## 2024-08-07 DIAGNOSIS — D631 Anemia in chronic kidney disease: Secondary | ICD-10-CM | POA: Diagnosis not present

## 2024-08-07 DIAGNOSIS — N2581 Secondary hyperparathyroidism of renal origin: Secondary | ICD-10-CM | POA: Diagnosis not present

## 2024-08-07 DIAGNOSIS — I10 Essential (primary) hypertension: Secondary | ICD-10-CM | POA: Diagnosis not present

## 2024-08-07 DIAGNOSIS — N1832 Chronic kidney disease, stage 3b: Secondary | ICD-10-CM | POA: Diagnosis not present

## 2024-08-07 NOTE — Progress Notes (Signed)
 Follow Up Visit   Patient Name: Jodi Kirby, female   Patient DOB: [birth year] Date of Service: 08/07/2024  Patient MRN: [id number] Provider Creating Note: Bonnell Sherry, MD  [phone] Primary Care Physician:   [address] [place] [address] Additional Physicians/ Providers:    History of Present Illness Jodi Kirby is a 78 y.o. female who is following up today for chronic kidney disease stage IV, hypertension, secondary hyperparathyroidism, and anemia of chronic kidney disease.  The patient's chronic kidney disease has improved slightly with most recent eGFR 31 with a microalbumin creatinine ratio 3.  InterVax hypertension blood pressure currently 123/63.  Patient also has known secondary hyperparathyroidism with most recent PTH 60, phosphorus 3.3, calcium  9.3.  Patient is taking calcitriol.  She also has anemia of chronic kidney disease with most recent hemoglobin of 9.5.  She has been referred to hematology however patient reports that she did not receive a call from them.   Medications   Current Outpatient Medications:  .  acetaminophen  (TYLENOL  8 HOUR) 650 MG 8 hr tablet, Take 650 mg by mouth, Disp: , Rfl:  .  ascorbic acid  (VITAMIN C) 1000 MG tablet, Take 1,000 mg by mouth, Disp: , Rfl:  .  aspirin  (ST JOSEPH) 81 MG EC tablet, Take 81 mg by mouth, Disp: , Rfl:  .  baclofen  (LIORESAL ) 10 MG tablet, Take 10 mg by mouth, Disp: , Rfl:  .  Bempedoic Acid-Ezetimibe  (Nexlizet ) 180-10 MG tablet, Take 1 tablet by mouth, Disp: , Rfl:  .  calcitriol (Rocaltrol) 0.25 MCG capsule, Take 1 capsule (0.25 mcg total) by mouth 1 (one) time each day, Disp: 90 capsule, Rfl: 3 .  Cholecalciferol  (Vitamin D3) 25 MCG (1000 UT) capsule, Take 1,000 Units by mouth, Disp: , Rfl:  .  EPINEPHrine  (EPIPEN ) 0.3 MG/0.3ML injection syringe, Inject 0.3 mg into the shoulder, thigh, or buttocks, Disp: , Rfl:  .  esomeprazole  (NexIUM ) 40 MG DR capsule, Take 40 mg by mouth, Disp: , Rfl:  .  fluticasone   (FLONASE ) 50 MCG/ACT nasal spray, Administer 2 sprays into affected nostril(s), Disp: , Rfl:  .  nitroglycerin  (NITROSTAT ) 0.4 MG SL tablet, Place 0.4 mg under the tongue, Disp: , Rfl:  .  Olmesartan -amLODIPine -HCTZ 40-10-12.5 MG tablet, Take 1 tablet by mouth 1 (one) time each day, Disp: , Rfl:  .  polyethylene glycol (GLYCOLAX ) 17 g packet, Take 17 g by mouth 1 (one) time each day, Disp: , Rfl:  .  QUEtiapine  (SEROquel ) 25 MG tablet, Take 25 mg by mouth, Disp: , Rfl:  .  tolterodine  (DETROL ) 2 MG tablet, Take 2 mg by mouth 1 (one) time each day in the evening, Disp: , Rfl:    Allergies Niacin er (antihyperlipidemic), Oxycodone -acetaminophen , and Statins  Problem List There is no problem list on file for this patient.    Review of Systems  Constitutional:  Negative for chills and fever.  Respiratory:  Negative for cough and shortness of breath.   Cardiovascular:  Negative for chest pain and palpitations.  Gastrointestinal:  Negative for nausea and vomiting.  Genitourinary:  Negative for dysuria, hematuria and urgency.     History Past Medical History:  Diagnosis Date  . Chronic kidney disease   . Coronary atherosclerosis of unspecified type of vessel, native or graft   . Esophageal reflux   . Essential hypertension   . Sleep apnea     History reviewed. No pertinent surgical history. Family History  Problem Relation Age of Onset  .  Hypertension Mother   . Diabetes Mother   . Stroke Father    Social History   Tobacco Use  . Smoking status: Never  . Smokeless tobacco: Never  Substance Use Topics  . Alcohol use: Never        Physical Exam  Vitals BP 123/63 (BP Location: Right upper arm, Patient Position: Sitting)   Pulse 70   Temp 97.8 F   Wt 185 lb (83.9 kg)   SpO2 98%   BMI 33.84 kg/m   PHYSICAL EXAM: General appearance: well developed, well nourished, NAD Eyes: anicteric sclerae, moist conjunctivae; no lid-lag  HENT: Atraumatic; hearing intact Neck:  Trachea midline; supple Lungs: CTAB, with normal respiratory effort  CV: S1S2, no murmurs or rubs. Abdomen: Soft, non-tender; bowel sounds present Extremities: No peripheral edema Skin: Warm and dry, normal skin turgor, no rashes noted. Psych: Appropriate affect, alert and oriented to person, place and time    Laboratory Studies  Chemistry  Lab Units 08/01/24 0954 03/28/24 0852 11/01/23 1155 09/28/23 0909 06/09/23 0949  SODIUM mmol/L 142 143 144 142 143  POTASSIUM mmol/L 4.1 3.8 4.1 4.0 4.4  CHLORIDE mmol/L 106 104 106 105 108  CO2 mmol/L 29 30 28 27 27   CALCIUM  mg/dL 9.3 9.7 9.5 9.4 9.5  PHOSPHORUS mg/dL 3.3 2.9 2.8  --   --   ALK PHOS U/L  --   --   --  81 62  PTH pg/mL 60 51 232*  --   --   GLUCOSE mg/dL 84 867* 879* 895* 97  ALBUMIN g/dL 4.0 4.2 4.1  4.4 4.5 4.3  BUN mg/dL 21 28* 20 23 22   CREATININE mg/dL 8.33* 8.21* 8.22* 8.51* 1.40*  HEMOGLOBIN A1C % of total Hgb  --   --   --  5.7*  --         No lab exists for component: IRON SATURATION, TRANSSATPER  CBC  Lab Units 08/01/24 0954 03/28/24 0852 11/01/23 1155  WBC AUTO Thousand/uL 8.6 6.7 9.5  HEMOGLOBIN g/dL 9.5* 9.6* 89.3*  HEMATOCRIT % 28.7* 29.9* 32.0*  MCV fL 87.5 87.4 85.3  PLATELETS AUTO Thousand/uL 276 263 336    Urine  Lab Units 08/01/24 0954 03/28/24 0852 11/01/23 1155  PROT/CREAT RATIO UR mg/g creat  --   --  0.070  70  ALB MG/G CREAT UR mg/g creat 3 NOTE  --     Lab Results  Component Value Date   PTH 60 08/01/2024   CALCIUM  9.3 08/01/2024   PHOS 3.3 08/01/2024     Imaging and Other Studies  11/01/2023: Negative SPEP/UPEP/ANA. 11/09/2023 renal ultrasound: Right kidney 8.9 cm, left kidney 9.0 cm, no hydronephrosis noted.   Orders Placed This Encounter  . CBC and Differential  . Renal Function Panel  . PTH, Intact  . Urine Albumin / Creatinine Ratio  . Ambulatory referral to Hematology / Oncology  . calcitriol (Rocaltrol) 0.25 MCG capsule        Impression/Recommendations  Jodi Kirby is a 78 y.o. female with  past medical history of statin myopathy, vitamin D  deficiency, vitamin D  B12 deficiency, anemia of chronic disease, lumbar stenosis, hypertension, history of lumbar fusion, aortic atherosclerosis, allergic rhinitis, GERD, obstructive sleep apnea, coronary artery disease, hypertension who was referred for the evaluation of chronic kidney disease stage IIIb and secondary hyperparathyroidism.  1.  Chronic kidney disease stage IIIb.  The patient's renal function is improved.  Most recent eGFR 31 with a microalbumin to creatinine ratio of 3.  We will continue to monitor renal parameters over time and maintain the patient on olmesartan .  2.  Hypertension.  Blood pressure under good control at 123/63.  Maintain the patient on olmesartan /amlodipine /HCTZ combination pill.  3.  Anemia of chronic kidney disease.  Hemoglobin 9.5.  We referred patient over to hematology at the last visit however patient has not yet received an appointment.  We will reschedule this.  4.  Secondary hyperparathyroidism.  Most recent PTH was 60 with a phosphorus of 3.3 and calcium  9.3.  No immediate need for phosphorus binders however patient was maintained on Calcitrol 0.25 mcg p.o. daily.   Return in about 4 months (around 12/08/2024).   Munsoor Lateef, MD

## 2024-08-21 ENCOUNTER — Encounter: Payer: Self-pay | Admitting: Oncology

## 2024-08-21 ENCOUNTER — Inpatient Hospital Stay: Attending: Oncology | Admitting: Oncology

## 2024-08-21 ENCOUNTER — Inpatient Hospital Stay

## 2024-08-21 VITALS — BP 140/60 | HR 83 | Temp 97.3°F | Resp 18 | Ht 62.0 in | Wt 186.1 lb

## 2024-08-21 DIAGNOSIS — N184 Chronic kidney disease, stage 4 (severe): Secondary | ICD-10-CM | POA: Diagnosis not present

## 2024-08-21 DIAGNOSIS — D631 Anemia in chronic kidney disease: Secondary | ICD-10-CM | POA: Insufficient documentation

## 2024-08-21 DIAGNOSIS — I129 Hypertensive chronic kidney disease with stage 1 through stage 4 chronic kidney disease, or unspecified chronic kidney disease: Secondary | ICD-10-CM | POA: Diagnosis not present

## 2024-08-21 LAB — CBC WITH DIFFERENTIAL/PLATELET
Abs Immature Granulocytes: 0.1 K/uL — ABNORMAL HIGH (ref 0.00–0.07)
Basophils Absolute: 0 K/uL (ref 0.0–0.1)
Basophils Relative: 0 %
Eosinophils Absolute: 0.1 K/uL (ref 0.0–0.5)
Eosinophils Relative: 1 %
HCT: 30.2 % — ABNORMAL LOW (ref 36.0–46.0)
Hemoglobin: 10.3 g/dL — ABNORMAL LOW (ref 12.0–15.0)
Immature Granulocytes: 1 %
Lymphocytes Relative: 24 %
Lymphs Abs: 1.8 K/uL (ref 0.7–4.0)
MCH: 28.4 pg (ref 26.0–34.0)
MCHC: 34.1 g/dL (ref 30.0–36.0)
MCV: 83.2 fL (ref 80.0–100.0)
Monocytes Absolute: 0.5 K/uL (ref 0.1–1.0)
Monocytes Relative: 7 %
Neutro Abs: 5 K/uL (ref 1.7–7.7)
Neutrophils Relative %: 67 %
Platelets: 285 K/uL (ref 150–400)
RBC: 3.63 MIL/uL — ABNORMAL LOW (ref 3.87–5.11)
RDW: 13.2 % (ref 11.5–15.5)
WBC: 7.6 K/uL (ref 4.0–10.5)
nRBC: 0 % (ref 0.0–0.2)

## 2024-08-21 LAB — COMPREHENSIVE METABOLIC PANEL WITH GFR
ALT: 28 U/L (ref 0–44)
AST: 24 U/L (ref 15–41)
Albumin: 4.4 g/dL (ref 3.5–5.0)
Alkaline Phosphatase: 68 U/L (ref 38–126)
Anion gap: 9 (ref 5–15)
BUN: 24 mg/dL — ABNORMAL HIGH (ref 8–23)
CO2: 26 mmol/L (ref 22–32)
Calcium: 9.6 mg/dL (ref 8.9–10.3)
Chloride: 102 mmol/L (ref 98–111)
Creatinine, Ser: 1.77 mg/dL — ABNORMAL HIGH (ref 0.44–1.00)
GFR, Estimated: 29 mL/min — ABNORMAL LOW (ref >60)
Glucose, Bld: 113 mg/dL — ABNORMAL HIGH (ref 70–99)
Potassium: 3.8 mmol/L (ref 3.5–5.1)
Sodium: 137 mmol/L (ref 135–145)
Total Bilirubin: 0.5 mg/dL (ref 0.0–1.2)
Total Protein: 7.4 g/dL (ref 6.5–8.1)

## 2024-08-21 LAB — RETICULOCYTES
Immature Retic Fract: 12.7 % (ref 2.3–15.9)
RBC.: 3.58 MIL/uL — ABNORMAL LOW (ref 3.87–5.11)
Retic Count, Absolute: 59.1 K/uL (ref 19.0–186.0)
Retic Ct Pct: 1.7 % (ref 0.4–3.1)

## 2024-08-21 LAB — VITAMIN B12: Vitamin B-12: 449 pg/mL (ref 180–914)

## 2024-08-21 LAB — LACTATE DEHYDROGENASE: LDH: 167 U/L (ref 98–192)

## 2024-08-21 LAB — FOLATE: Folate: >20 ng/mL (ref >5.9)

## 2024-08-21 LAB — TSH: TSH: 1.025 u[IU]/mL (ref 0.350–4.500)

## 2024-08-21 LAB — FERRITIN: Ferritin: 156 ng/mL (ref 11–307)

## 2024-08-21 NOTE — Progress Notes (Signed)
 Hematology/Oncology Consult note [facility] [phone] [phone]  Patient Care Team: Sowles, Krichna, MD as PCP - General Perla Evalene PARAS, MD as Consulting Physician (Cardiology) Penne Knee, MD (Inactive) as Consulting Physician (Urology) Aundria Ladell POUR, MD as Consulting Physician (Gastroenterology) Cherilyn Debby CROME, MD as Consulting Physician (Internal Medicine)   Name of the patient: Jodi Kirby  [id number]  [birth year]    Reason for referral-anemia   Referring physician-Dr. Marcelino  Date of visit: 08/21/24   History of presenting illness- Patient is a 78 year old female with history of hypertension, stage IV CKD who sees Dr. Lazarus as an outpatient.  She has been referred for anemia.  Recent CBC from 08/01/2024 showed white count of 8.6, H&H of 9.5/28.7 and a platelet count of 276.  Her hemoglobin was 10.6 in December 2024.  Patient has not had any recent anemia workup done.  She feels generally well but notes fluctuating energy levels. Her appetite remains good, and she stays active.  She has a history of diverticulitis, which causes intermittent pain that comes and goes, but she is not experiencing any pain currently.  ECOG PS- 0  Pain scale- 0   Review of systems- Review of Systems  Constitutional:  Negative for chills, fever, malaise/fatigue and weight loss.  HENT:  Negative for congestion, ear discharge and nosebleeds.   Eyes:  Negative for blurred vision.  Respiratory:  Negative for cough, hemoptysis, sputum production, shortness of breath and wheezing.   Cardiovascular:  Negative for chest pain, palpitations, orthopnea and claudication.  Gastrointestinal:  Negative for abdominal pain, blood in stool, constipation, diarrhea, heartburn, melena, nausea and vomiting.  Genitourinary:  Negative for dysuria, flank pain, frequency, hematuria and urgency.  Musculoskeletal:  Negative for back pain, joint pain and  myalgias.  Skin:  Negative for rash.  Neurological:  Negative for dizziness, tingling, focal weakness, seizures, weakness and headaches.  Endo/Heme/Allergies:  Does not bruise/bleed easily.  Psychiatric/Behavioral:  Negative for depression and suicidal ideas. The patient does not have insomnia.     Allergies  Allergen Reactions   Niaspan [Niacin Er (Antihyperlipidemic)] Other (See Comments)    Patient states she can't remember the reaction because it so long ago.   Oxycodone -Acetaminophen  Nausea Only   Statins Other (See Comments)    Muscle and joint pain.    Patient Active Problem List   Diagnosis Date Noted   Statin myopathy 06/09/2023   Muscle spasm 06/09/2023   Vitamin D  deficiency 04/27/2022   Vitamin B12 deficiency 12/29/2021   Anemia of chronic disease 11/19/2021   Lumbar stenosis 12/16/2020   Benign hypertension with chronic kidney disease, stage III (HCC) 12/22/2018   History of lumbar fusion 07/19/2018   DDD (degenerative disc disease), lumbosacral 07/19/2018   Obesity (BMI 30.0-34.9) 07/19/2018   Mild cognitive impairment 12/21/2017   Aortic atherosclerosis 05/15/2017   Impingement syndrome of left shoulder 05/12/2016   Chronic insomnia 11/08/2015   Angina pectoris 07/10/2015   Seasonal allergic rhinitis 07/09/2015   Gastroesophageal reflux disease without esophagitis 07/09/2015   Bee sting allergy 07/09/2015   History of knee replacement procedure of right knee 04/29/2015   History of colonic polyps 09/24/2014   Fibrocystic breast 09/24/2014   Dyslipidemia 02/09/2013   Atherosclerosis of native coronary artery of native heart with stable angina pectoris 02/09/2013   Obstructive sleep apnea on CPAP 02/09/2013   Hypertension 02/09/2013     Past Medical History:  Diagnosis Date   Allergic rhinitis, cause unspecified    Allergy  Anxiety    Chronic kidney disease    Coronary artery disease    DDD (degenerative disc disease), lumbar    Dysmetabolic  syndrome X    Dysuria    Esophageal reflux    Essential hypertension, benign    HNP (herniated nucleus pulposus), lumbar    Dr. [facility])   Hyperlipidemia    Lumbago    Lumbar radiculitis    Dr. Avanell [facility])   Lump or mass in breast    LEFT BREAST   Myalgia and myositis, unspecified    Myocardial infarction (HCC)    Obstructive sleep apnea    CPAP   Osteoarthrosis, unspecified whether generalized or localized, lower leg    Other abnormal glucose    Other and unspecified angina pectoris    Personal history of malignant neoplasm of other endocrine glands and related structures    Toxic effect of venom(989.5)    Unspecified iridocyclitis    Unspecified vitamin D  deficiency    Vertigo 2018   1 episode     Past Surgical History:  Procedure Laterality Date   ABDOMINAL HYSTERECTOMY  1970   ANKLE SURGERY Right 1980   ANTERIOR LATERAL LUMBAR FUSION WITH PERCUTANEOUS SCREW 2 LEVEL N/A 12/16/2020   Procedure: L2/3, L3/4 LATERAL LUMBAR FUSION, L2-4 PEDICLE SCREW FIXATION;  Surgeon: Bluford Standing, MD;  Location: [facility] ORS;  Service: Neurosurgery;  Laterality: N/A;   BLADDER SURGERY  2012   CARDIAC CATHETERIZATION  2003   [place]   CARDIAC CATHETERIZATION     [place]   CATARACT EXTRACTION W/PHACO Right 02/09/2018   Procedure: CATARACT EXTRACTION PHACO AND INTRAOCULAR LENS PLACEMENT (IOC) RIGHT;  Surgeon: Mittie Gaskin, MD;  Location[facility];  Service: Ophthalmology;  Laterality: Right;  sleep apnea   CATARACT EXTRACTION W/PHACO Left 03/09/2018   Procedure: CATARACT EXTRACTION PHACO AND INTRAOCULAR LENS PLACEMENT (IOC);  Surgeon: Mittie Gaskin, MD;  Location: [facility];  Service: Ophthalmology;  Laterality: Left;  IVA TOPICALLEFT   COLONOSCOPY  2008, 2015   Dr. Jinny   COLONOSCOPY N/A 03/05/2022   Procedure: COLONOSCOPY;  Surgeon: Onita Standing Sharper, DO;  Location: [facility] ENDOSCOPY;  Service: Gastroenterology;  Laterality: N/A;   CYSTOSTOMY      ESOPHAGOGASTRODUODENOSCOPY N/A 03/05/2022   Procedure: ESOPHAGOGASTRODUODENOSCOPY (EGD);  Surgeon: Onita Standing Sharper, DO;  Location: [facility] ENDOSCOPY;  Service: Gastroenterology;  Laterality: N/A;   KNEE SURGERY  08/18/2011   arthroscopic right   LUMBAR FUSION     L4-5, S-1   NECK SURGERY  2003   TOTAL KNEE ARTHROPLASTY Right 04/29/2015   Procedure: TOTAL KNEE ARTHROPLASTY;  Surgeon: Helayne Glenn, MD;  Location: [facility] ORS;  Service: Orthopedics;  Laterality: Right;   TUBAL LIGATION      Social History   Socioeconomic History   Marital status: Married    Spouse name: Morris   Number of children: 4   Years of education: Not on file   Highest education level: 11th grade  Occupational History   Occupation: Retired  Tobacco Use   Smoking status: Never   Smokeless tobacco: Never   Tobacco comments:    smoking cessation materials not required  Vaping Use   Vaping status: Never Used  Substance and Sexual Activity   Alcohol use: No    Alcohol/week: 0.0 standard drinks of alcohol   Drug use: No   Sexual activity: Not Currently    Partners: Male    Birth control/protection: None  Other Topics Concern   Not on file  Social History  Narrative   Not on file   Social Drivers of Health   Financial Resource Strain: Low Risk  (05/18/2024)   Received from [facility]   Overall Financial Resource Strain (CARDIA)    Difficulty of Paying Living Expenses: Not hard at all  Food Insecurity: No Food Insecurity (08/21/2024)   Hunger Vital Sign    Worried About Running Out of Food in the Last Year: Never true    Ran Out of Food in the Last Year: Never true  Transportation Needs: No Transportation Needs (08/21/2024)   PRAPARE - Administrator, Civil Service (Medical): No    Lack of Transportation (Non-Medical): No  Physical Activity: Insufficiently Active (05/24/2023)   Exercise Vital Sign    Days of Exercise per Week: 3 days    Minutes of Exercise per Session:  10 min  Stress: No Stress Concern Present (05/24/2023)   [employer] of Occupational Health - Occupational Stress Questionnaire    Feeling of Stress : Not at all  Social Connections: Moderately Integrated (05/24/2023)   Social Connection and Isolation Panel    Frequency of Communication with Friends and Family: More than three times a week    Frequency of Social Gatherings with Friends and Family: Twice a week    Attends Religious Services: More than 4 times per year    Active Member of [employer] or Organizations: No    Attends Banker Meetings: Never    Marital Status: Married  Catering manager Violence: Not At Risk (08/21/2024)   Humiliation, Afraid, Rape, and Kick questionnaire    Fear of Current or Ex-Partner: No    Emotionally Abused: No    Physically Abused: No    Sexually Abused: No     Family History  Problem Relation Age of Onset   Kidney disease Mother    Hypertension Mother    CAD Father    Healthy Sister    Kidney disease Son    Kidney cancer Neg Hx    Bladder Cancer Neg Hx      Current Outpatient Medications:    acetaminophen  (TYLENOL ) 650 MG CR tablet, Take 650 mg by mouth daily as needed for pain., Disp: , Rfl:    Ascorbic Acid  (VITAMIN C WITH ROSE HIPS) 1000 MG tablet, Take 1,000 mg by mouth daily. With Zinc, Disp: , Rfl:    aspirin  EC 81 MG tablet, Take 81 mg by mouth daily. Swallow whole., Disp: , Rfl:    baclofen  (LIORESAL ) 10 MG tablet, Take 1 tablet (10 mg total) by mouth at bedtime as needed for muscle spasms., Disp: 90 tablet, Rfl: 1   calcitRIOL (ROCALTROL) 0.25 MCG capsule, Take 0.25 mcg by mouth daily., Disp: , Rfl:    Cholecalciferol  (VITAMIN D3) 1000 UNITS CAPS, Take 1,000 Units by mouth every morning. , Disp: , Rfl:    denosumab  (PROLIA ) 60 MG/ML SOSY injection, Inject 60 mg into the skin every 6 (six) months., Disp: , Rfl:    EPINEPHrine  (EPI-PEN) 0.3 mg/0.3 mL DEVI, Inject 0.3 mg into the muscle as needed (anaphylaxis)., Disp: , Rfl:     esomeprazole  (NEXIUM ) 40 MG capsule, Take 1 capsule (40 mg total) by mouth daily., Disp: 90 capsule, Rfl: 1   ezetimibe  (ZETIA ) 10 MG tablet, Take 1 tablet (10 mg total) by mouth daily., Disp: 90 tablet, Rfl: 3   fluticasone  (FLONASE ) 50 MCG/ACT nasal spray, Use 2 spray(s) in each nostril once daily, Disp: 48 g, Rfl: 0   Multiple Vitamins-Minerals (  CENTRUM SILVER PO), Take 1 tablet by mouth every morning. , Disp: , Rfl:    nitroGLYCERIN  (NITROSTAT ) 0.4 MG SL tablet, Place 1 tablet (0.4 mg total) under the tongue every 5 (five) minutes as needed for chest pain., Disp: 25 tablet, Rfl: 0   Olmesartan -amLODIPine -HCTZ 40-10-12.5 MG TABS, Take 1 tablet by mouth daily., Disp: 90 tablet, Rfl: 3   polyethylene glycol (MIRALAX  / GLYCOLAX ) 17 g packet, Take 17 g by mouth daily., Disp: 14 each, Rfl: 0   QUEtiapine  (SEROQUEL ) 25 MG tablet, Take 1 tablet (25 mg total) by mouth at bedtime. For sleep, Disp: 90 tablet, Rfl: 1   Physical exam:  Vitals:   08/21/24 1052  BP: (!) 140/60  Pulse: 83  Resp: 18  Temp: (!) 97.3 F (36.3 C)  TempSrc: Tympanic  SpO2: 100%  Weight: 186 lb 1.6 oz (84.4 kg)  Height: 5' 2 (1.575 m)   Physical Exam Cardiovascular:     Rate and Rhythm: Normal rate and regular rhythm.     Heart sounds: Normal heart sounds.  Pulmonary:     Effort: Pulmonary effort is normal.     Breath sounds: Normal breath sounds.  Abdominal:     General: Bowel sounds are normal.     Palpations: Abdomen is soft.  Skin:    General: Skin is warm and dry.  Neurological:     Mental Status: She is alert and oriented to person, place, and time.           Latest Ref Rng & Units 06/27/2024   10:04 AM  CMP  Total Protein 6.1 - 8.1 g/dL 7.1   Total Bilirubin 0.2 - 1.2 mg/dL 0.4   AST 10 - 35 U/L 21   ALT 6 - 29 U/L 20       Latest Ref Rng & Units 08/01/2023   11:13 AM  CBC  WBC 4.0 - 10.5 K/uL 8.0   Hemoglobin 12.0 - 15.0 g/dL 89.3   Hematocrit 63.9 - 46.0 % 31.7   Platelets 150 -  400 K/uL 279      Assessment and plan- Patient is a 78 y.o. female with history of stage IV CKD referred for anemia  Anemia in the setting of chronic kidney disease, stage 4 Anemia with hemoglobin decreasing from 10.5 to 9.5. Differential includes nutritional deficiencies and anemia of chronic kidney disease. - Order blood work for iron, thyroid , B12, and folic acid  levels.  I will also be checking myeloma panel, serum free light chains, reticulocyte haptoglobin and LDH. - Consider EPO if hemoglobin is 9 or less, aiming to maintain between 10 and 11. -See me in 2 weeks to discuss results of blood work  Thank you for this kind referral and the opportunity to participate in the care of this patient   Visit Diagnosis 1. Anemia of chronic kidney failure, stage 4 (severe) (HCC)     Dr. Annah Skene, MD, MPH [facility] [phone] 08/21/2024

## 2024-08-21 NOTE — Progress Notes (Signed)
 New patient; Anemia.

## 2024-08-22 LAB — KAPPA/LAMBDA LIGHT CHAINS
Kappa free light chain: 32.9 mg/L — ABNORMAL HIGH (ref 3.3–19.4)
Kappa, lambda light chain ratio: 1.32 (ref 0.26–1.65)
Lambda free light chains: 25 mg/L (ref 5.7–26.3)

## 2024-08-23 LAB — HAPTOGLOBIN: Haptoglobin: 144 mg/dL (ref 42–346)

## 2024-08-29 DIAGNOSIS — N1832 Chronic kidney disease, stage 3b: Secondary | ICD-10-CM | POA: Diagnosis not present

## 2024-08-29 DIAGNOSIS — M546 Pain in thoracic spine: Secondary | ICD-10-CM | POA: Diagnosis not present

## 2024-08-29 DIAGNOSIS — Z2821 Immunization not carried out because of patient refusal: Secondary | ICD-10-CM | POA: Diagnosis not present

## 2024-08-29 DIAGNOSIS — M542 Cervicalgia: Secondary | ICD-10-CM | POA: Diagnosis not present

## 2024-08-29 DIAGNOSIS — M5410 Radiculopathy, site unspecified: Secondary | ICD-10-CM | POA: Diagnosis not present

## 2024-08-29 DIAGNOSIS — I10 Essential (primary) hypertension: Secondary | ICD-10-CM | POA: Diagnosis not present

## 2024-08-29 LAB — MULTIPLE MYELOMA PANEL, SERUM
Albumin SerPl Elph-Mcnc: 3.6 g/dL (ref 2.9–4.4)
Albumin/Glob SerPl: 1.2 (ref 0.7–1.7)
Alpha 1: 0.3 g/dL (ref 0.0–0.4)
Alpha2 Glob SerPl Elph-Mcnc: 0.8 g/dL (ref 0.4–1.0)
B-Globulin SerPl Elph-Mcnc: 1.2 g/dL (ref 0.7–1.3)
Gamma Glob SerPl Elph-Mcnc: 0.9 g/dL (ref 0.4–1.8)
Globulin, Total: 3.2 g/dL (ref 2.2–3.9)
IgA: 194 mg/dL (ref 64–422)
IgG (Immunoglobin G), Serum: 996 mg/dL (ref 586–1602)
IgM (Immunoglobulin M), Srm: 154 mg/dL (ref 26–217)
Total Protein ELP: 6.8 g/dL (ref 6.0–8.5)

## 2024-08-31 DIAGNOSIS — E559 Vitamin D deficiency, unspecified: Secondary | ICD-10-CM | POA: Diagnosis not present

## 2024-08-31 DIAGNOSIS — M810 Age-related osteoporosis without current pathological fracture: Secondary | ICD-10-CM | POA: Diagnosis not present

## 2024-09-05 ENCOUNTER — Encounter: Payer: Self-pay | Admitting: Oncology

## 2024-09-05 ENCOUNTER — Inpatient Hospital Stay: Attending: Oncology | Admitting: Oncology

## 2024-09-05 VITALS — BP 134/63 | HR 68 | Temp 97.8°F | Resp 18 | Ht 62.0 in | Wt 190.6 lb

## 2024-09-05 DIAGNOSIS — D631 Anemia in chronic kidney disease: Secondary | ICD-10-CM | POA: Insufficient documentation

## 2024-09-05 DIAGNOSIS — N184 Chronic kidney disease, stage 4 (severe): Secondary | ICD-10-CM | POA: Insufficient documentation

## 2024-09-05 DIAGNOSIS — I129 Hypertensive chronic kidney disease with stage 1 through stage 4 chronic kidney disease, or unspecified chronic kidney disease: Secondary | ICD-10-CM | POA: Diagnosis not present

## 2024-09-05 NOTE — Progress Notes (Signed)
 Patient feeling ok & does not have any new or acute concerns at this time.

## 2024-09-05 NOTE — Progress Notes (Signed)
 Hematology/Oncology Consult note [facility]  [phone] [phone]  Patient Care Team: Sowles, Krichna, MD as PCP - General Perla Evalene PARAS, MD as Consulting Physician (Cardiology) Penne Knee, MD (Inactive) as Consulting Physician (Urology) Aundria Ladell POUR, MD as Consulting Physician (Gastroenterology) Cherilyn Debby CROME, MD as Consulting Physician (Internal Medicine)   Name of the patient: Jodi Kirby  [id number]  [birth year]   Date of visit: 09/05/24  Diagnosis-anemia of chronic kidney disease  Chief complaint/ Reason for visit-discuss results of blood work  Heme/Onc history: Patient is a 78 year old female with history of hypertension, stage IV CKD who sees Dr. Lazarus as an outpatient. She has been referred for anemia. Recent CBC from 08/01/2024 showed white count of 8.6, H&H of 9.5/28.7 and a platelet count of 276. Her hemoglobin was 10.6 in December 2024. Patient has not had any recent anemia workup done.   Results of blood work from 08/21/2024 were as follows: CBC showed white count of 7.6, H&H of 10.3/30.2 with an MCV of 83.2 and a platelet count of 285.  Ferritin levels were normal at 156.  B12 normal at 449.  Folate normal.  Haptoglobin normal at 144 with a TSH of 1.02.  Myeloma panel did not show any evidence of M protein.  Kappa free light chain was mildly elevated at 32 but free light chain ratio normal at 1.3.  CMP showed evidence of CKD with a creatinine of 1.7  Interval history-doing well presently and denies any complaints at this time.  She will be getting yearly injections for her osteoporosis through endocrinology  ECOG PS- 1 Pain scale- 0   Review of systems- Review of Systems  Constitutional:  Negative for chills, fever, malaise/fatigue and weight loss.  HENT:  Negative for congestion, ear discharge and nosebleeds.   Eyes:  Negative for blurred vision.  Respiratory:  Negative for cough, hemoptysis,  sputum production, shortness of breath and wheezing.   Cardiovascular:  Negative for chest pain, palpitations, orthopnea and claudication.  Gastrointestinal:  Negative for abdominal pain, blood in stool, constipation, diarrhea, heartburn, melena, nausea and vomiting.  Genitourinary:  Negative for dysuria, flank pain, frequency, hematuria and urgency.  Musculoskeletal:  Negative for back pain, joint pain and myalgias.  Skin:  Negative for rash.  Neurological:  Negative for dizziness, tingling, focal weakness, seizures, weakness and headaches.  Endo/Heme/Allergies:  Does not bruise/bleed easily.  Psychiatric/Behavioral:  Negative for depression and suicidal ideas. The patient does not have insomnia.       Allergies  Allergen Reactions   Niaspan [Niacin Er (Antihyperlipidemic)] Other (See Comments)    Patient states she can't remember the reaction because it so long ago.   Oxycodone -Acetaminophen  Nausea Only   Statins Other (See Comments)    Muscle and joint pain.     Past Medical History:  Diagnosis Date   Allergic rhinitis, cause unspecified    Allergy    Anxiety    Chronic kidney disease    Coronary artery disease    DDD (degenerative disc disease), lumbar    Dysmetabolic syndrome X    Dysuria    Esophageal reflux    Essential hypertension, benign    HNP (herniated nucleus pulposus), lumbar    Dr. Avanell [facility])   Hyperlipidemia    Lumbago    Lumbar radiculitis    Dr. Avanell [facility])   Lump or mass in breast    LEFT BREAST   Myalgia and myositis, unspecified    Myocardial infarction (HCC)  Obstructive sleep apnea    CPAP   Osteoarthrosis, unspecified whether generalized or localized, lower leg    Other abnormal glucose    Other and unspecified angina pectoris    Personal history of malignant neoplasm of other endocrine glands and related structures    Toxic effect of venom(989.5)    Unspecified iridocyclitis    Unspecified vitamin D  deficiency    Vertigo 2018   1  episode     Past Surgical History:  Procedure Laterality Date   ABDOMINAL HYSTERECTOMY  1970   ANKLE SURGERY Right 1980   ANTERIOR LATERAL LUMBAR FUSION WITH PERCUTANEOUS SCREW 2 LEVEL N/A 12/16/2020   Procedure: L2/3, L3/4 LATERAL LUMBAR FUSION, L2-4 PEDICLE SCREW FIXATION;  Surgeon: Bluford Standing, MD;  Location: [facility] ORS;  Service: Neurosurgery;  Laterality: N/A;   BLADDER SURGERY  2012   CARDIAC CATHETERIZATION  2003   [place]   CARDIAC CATHETERIZATION     [place]   CATARACT EXTRACTION W/PHACO Right 02/09/2018   Procedure: CATARACT EXTRACTION PHACO AND INTRAOCULAR LENS PLACEMENT (IOC) RIGHT;  Surgeon: Mittie Gaskin, MD;  Location: [facility];  Service: Ophthalmology;  Laterality: Right;  sleep apnea   CATARACT EXTRACTION W/PHACO Left 03/09/2018   Procedure: CATARACT EXTRACTION PHACO AND INTRAOCULAR LENS PLACEMENT (IOC);  Surgeon: Mittie Gaskin, MD;  Location: [facility];  Service: Ophthalmology;  Laterality: Left;  IVA TOPICALLEFT   COLONOSCOPY  2008, 2015   Dr. Jinny   COLONOSCOPY N/A 03/05/2022   Procedure: COLONOSCOPY;  Surgeon: Onita Standing Sharper, DO;  Location: [facility];  Service: Gastroenterology;  Laterality: N/A;   CYSTOSTOMY     ESOPHAGOGASTRODUODENOSCOPY N/A 03/05/2022   Procedure: ESOPHAGOGASTRODUODENOSCOPY (EGD);  Surgeon: Onita Standing Sharper, DO;  Location: [facility] END[facility];  Service: Gastroenterology;  Laterality: N/A;   KNEE SURGERY  08/18/2011   arthroscopic right   LUMBAR FUSION     L4-5, S-1   NECK SURGERY  2003   TOTAL KNEE ARTHROPLASTY Right 04/29/2015   Procedure: TOTAL KNEE ARTHROPLASTY;  Surgeon: Helayne Glenn, MD;  Location: [facility] ORS;  Service: Orthopedics;  Laterality: Right;   TUBAL LIGATION      Social History   Socioeconomic History   Marital status: Married    Spouse name: Morris   Number of children: 4   Years of education: Not on file   Highest education level: 11th grade  Occupational History    Occupation: Retired  Tobacco Use   Smoking status: Never   Smokeless tobacco: Never   Tobacco comments:    smoking cessation materials not required  Vaping Use   Vaping status: Never Used  Substance and Sexual Activity   Alcohol use: No    Alcohol/week: 0.0 standard drinks of alcohol   Drug use: No   Sexual activity: Not Currently    Partners: Male    Birth control/protection: None  Other Topics Concern   Not on file  Social History Narrative   Not on file   Social Drivers of Health   Financial Resource Strain: Low Risk  (05/18/2024)   Received from [facility]   Overall Financial Resource Strain (CARDIA)    Difficulty of Paying Living Expenses: Not hard at all  Food Insecurity: No Food Insecurity (08/21/2024)   Hunger Vital Sign    Worried About Running Out of Food in the Last Year: Never true    Ran Out of Food in the Last Year: Never true  Transportation Needs: No Transportation Needs (08/21/2024)   PRAPARE - Transportation  Lack of Transportation (Medical): No    Lack of Transportation (Non-Medical): No  Physical Activity: Insufficiently Active (05/24/2023)   Exercise Vital Sign    Days of Exercise per Week: 3 days    Minutes of Exercise per Session: 10 min  Stress: No Stress Concern Present (05/24/2023)   [employer] of Occupational Health - Occupational Stress Questionnaire    Feeling of Stress : Not at all  Social Connections: Moderately Integrated (05/24/2023)   Social Connection and Isolation Panel    Frequency of Communication with Friends and Family: More than three times a week    Frequency of Social Gatherings with Friends and Family: Twice a week    Attends Religious Services: More than 4 times per year    Active Member of [employer] or Organizations: No    Attends Banker Meetings: Never    Marital Status: Married  Catering Manager Violence: Not At Risk (08/21/2024)   Humiliation, Afraid, Rape, and Kick questionnaire     Fear of Current or Ex-Partner: No    Emotionally Abused: No    Physically Abused: No    Sexually Abused: No    Family History  Problem Relation Age of Onset   Kidney disease Mother    Hypertension Mother    CAD Father    Healthy Sister    Kidney disease Son    Kidney cancer Neg Hx    Bladder Cancer Neg Hx      Current Outpatient Medications:    acetaminophen  (TYLENOL ) 650 MG CR tablet, Take 650 mg by mouth daily as needed for pain., Disp: , Rfl:    Ascorbic Acid  (VITAMIN C WITH ROSE HIPS) 1000 MG tablet, Take 1,000 mg by mouth daily. With Zinc, Disp: , Rfl:    aspirin  EC 81 MG tablet, Take 81 mg by mouth daily. Swallow whole., Disp: , Rfl:    baclofen  (LIORESAL ) 10 MG tablet, Take 1 tablet (10 mg total) by mouth at bedtime as needed for muscle spasms., Disp: 90 tablet, Rfl: 1   calcitRIOL (ROCALTROL) 0.25 MCG capsule, Take 0.25 mcg by mouth daily., Disp: , Rfl:    Cholecalciferol  (VITAMIN D3) 1000 UNITS CAPS, Take 1,000 Units by mouth every morning. , Disp: , Rfl:    denosumab  (PROLIA ) 60 MG/ML SOSY injection, Inject 60 mg into the skin every 6 (six) months., Disp: , Rfl:    EPINEPHrine  (EPI-PEN) 0.3 mg/0.3 mL DEVI, Inject 0.3 mg into the muscle as needed (anaphylaxis)., Disp: , Rfl:    esomeprazole  (NEXIUM ) 40 MG capsule, Take 1 capsule (40 mg total) by mouth daily., Disp: 90 capsule, Rfl: 1   ezetimibe  (ZETIA ) 10 MG tablet, Take 1 tablet (10 mg total) by mouth daily., Disp: 90 tablet, Rfl: 3   fluticasone  (FLONASE ) 50 MCG/ACT nasal spray, Use 2 spray(s) in each nostril once daily, Disp: 48 g, Rfl: 0   Multiple Vitamins-Minerals (CENTRUM SILVER PO), Take 1 tablet by mouth every morning. , Disp: , Rfl:    nitroGLYCERIN  (NITROSTAT ) 0.4 MG SL tablet, Place 1 tablet (0.4 mg total) under the tongue every 5 (five) minutes as needed for chest pain., Disp: 25 tablet, Rfl: 0   Olmesartan -amLODIPine -HCTZ 40-10-12.5 MG TABS, Take 1 tablet by mouth daily., Disp: 90 tablet, Rfl: 3   polyethylene  glycol (MIRALAX  / GLYCOLAX ) 17 g packet, Take 17 g by mouth daily., Disp: 14 each, Rfl: 0   QUEtiapine  (SEROQUEL ) 25 MG tablet, Take 1 tablet (25 mg total) by mouth at bedtime. For sleep, Disp:  90 tablet, Rfl: 1  Physical exam:  Vitals:   09/05/24 0909  BP: 134/63  Pulse: 68  Resp: 18  Temp: 97.8 F (36.6 C)  TempSrc: Tympanic  SpO2: 100%  Weight: 190 lb 9.6 oz (86.5 kg)  Height: 5' 2 (1.575 m)   Physical Exam Cardiovascular:     Rate and Rhythm: Normal rate and regular rhythm.     Heart sounds: Normal heart sounds.  Pulmonary:     Effort: Pulmonary effort is normal.     Breath sounds: Normal breath sounds.  Skin:    General: Skin is warm and dry.  Neurological:     Mental Status: She is alert and oriented to person, place, and time.      I have personally reviewed labs listed below:    Latest Ref Rng & Units 08/21/2024   11:59 AM  CMP  Glucose 70 - 99 mg/dL 886   BUN 8 - 23 mg/dL 24   Creatinine 9.55 - 1.00 mg/dL 8.22   Sodium 864 - 854 mmol/L 137   Potassium 3.5 - 5.1 mmol/L 3.8   Chloride 98 - 111 mmol/L 102   CO2 22 - 32 mmol/L 26   Calcium  8.9 - 10.3 mg/dL 9.6   Total Protein 6.5 - 8.1 g/dL 7.4   Total Bilirubin 0.0 - 1.2 mg/dL 0.5   Alkaline Phos 38 - 126 U/L 68   AST 15 - 41 U/L 24   ALT 0 - 44 U/L 28       Latest Ref Rng & Units 08/21/2024   11:59 AM  CBC  WBC 4.0 - 10.5 K/uL 7.6   Hemoglobin 12.0 - 15.0 g/dL 89.6   Hematocrit 63.9 - 46.0 % 30.2   Platelets 150 - 400 K/uL 285       Assessment and plan- Patient is a 78 y.o. female referred for anemia of chronic kidney disease   Anemia secondary to chronic kidney disease, stage 4 Chronic anemia with hemoglobin around 10, likely due to stage 4 CKD. Iron, B12, folic acid , thyroid  levels, and multiple myeloma workup normal.  No evidence of hemolysis.  Erythropoietin not indicated as hemoglobin >9. Risks include MI and CVA if hemoglobin >11. - CBC ferritin and iron studies in 3 and 6 months and I  will see her back in 6 months - Consider EPO if hemoglobin <9.        Visit Diagnosis 1. Anemia of chronic kidney failure, stage 4 (severe) (HCC)      Dr. Annah Skene, MD, MPH [facility] at [facility] [phone] 09/05/2024 9:25 AM

## 2024-09-05 NOTE — Addendum Note (Signed)
 Addended by: Jenni Thew on: 09/05/2024 09:39 AM   Modules accepted: Orders

## 2024-09-18 DIAGNOSIS — M5416 Radiculopathy, lumbar region: Secondary | ICD-10-CM | POA: Diagnosis not present

## 2024-09-18 DIAGNOSIS — M5412 Radiculopathy, cervical region: Secondary | ICD-10-CM | POA: Diagnosis not present

## 2024-09-18 DIAGNOSIS — M47816 Spondylosis without myelopathy or radiculopathy, lumbar region: Secondary | ICD-10-CM | POA: Diagnosis not present

## 2024-09-19 ENCOUNTER — Other Ambulatory Visit: Payer: Self-pay | Admitting: Family Medicine

## 2024-09-19 DIAGNOSIS — M5416 Radiculopathy, lumbar region: Secondary | ICD-10-CM

## 2024-09-19 DIAGNOSIS — M5412 Radiculopathy, cervical region: Secondary | ICD-10-CM

## 2024-09-21 ENCOUNTER — Ambulatory Visit
Admission: RE | Admit: 2024-09-21 | Discharge: 2024-09-21 | Disposition: A | Discharge status: Home / Self Care | Source: Ambulatory Visit | Attending: Family Medicine | Admitting: Family Medicine

## 2024-09-21 DIAGNOSIS — M4722 Other spondylosis with radiculopathy, cervical region: Secondary | ICD-10-CM | POA: Diagnosis not present

## 2024-09-21 DIAGNOSIS — M5020 Other cervical disc displacement, unspecified cervical region: Secondary | ICD-10-CM | POA: Diagnosis not present

## 2024-09-21 DIAGNOSIS — M5412 Radiculopathy, cervical region: Secondary | ICD-10-CM

## 2024-09-21 DIAGNOSIS — M5416 Radiculopathy, lumbar region: Secondary | ICD-10-CM

## 2024-10-04 DIAGNOSIS — M810 Age-related osteoporosis without current pathological fracture: Secondary | ICD-10-CM | POA: Diagnosis not present

## 2024-10-10 ENCOUNTER — Ambulatory Visit

## 2024-10-10 VITALS — Ht 62.0 in | Wt 190.0 lb

## 2024-10-10 DIAGNOSIS — Z0000 Encounter for general adult medical examination without abnormal findings: Secondary | ICD-10-CM

## 2024-10-10 NOTE — Progress Notes (Signed)
 Chief Complaint  Patient presents with   Medicare Wellness     Subjective:   Jodi Kirby is a 78 y.o. female who presents for a Medicare Annual Wellness Visit.  Visit info / Clinical Intake: Medicare Wellness Visit Type:: Subsequent Annual Wellness Visit Persons participating in visit and providing information:: patient Medicare Wellness Visit Mode:: Video Since this visit was completed virtually, some vitals may be partially provided or unavailable. Missing vitals are due to the limitations of the virtual format.: Unable to obtain vitals - no equipment If Telephone or Video please confirm:: I connected with patient using audio/video enable telemedicine. I verified patient identity with two identifiers, discussed telehealth limitations, and patient agreed to proceed. Patient Location:: home Provider Location:: home Interpreter Needed?: No Pre-visit prep was completed: no AWV questionnaire completed by patient prior to visit?: no Living arrangements:: lives with spouse/significant other Patient's Overall Health Status Rating: very good Typical amount of pain: none Does pain affect daily life?: no Are you currently prescribed opioids?: no  Dietary Habits and Nutritional Risks How many meals a day?: 2 Eats fruit and vegetables daily?: yes Most meals are obtained by: preparing own meals In the last 2 weeks, have you had any of the following?: none Diabetic:: no  Functional Status Activities of Daily Living (to include ambulation/medication): Independent Ambulation: Independent with device- listed below Home Assistive Devices/Equipment: CPAP; Eyeglasses Medication Administration: Independent Home Management (perform basic housework or laundry): Independent Manage your own finances?: yes Primary transportation is: driving Concerns about vision?: no *vision screening is required for WTM* Concerns about hearing?: no  Fall Screening Falls in the past year?: 0 Number  of falls in past year: 0 Was there an injury with Fall?: 0 Fall Risk Category Calculator: 0 Patient Fall Risk Level: Low Fall Risk  Fall Risk Patient at Risk for Falls Due to: No Fall Risks Fall risk Follow up: Falls evaluation completed; Falls prevention discussed  Home and Transportation Safety: All rugs have non-skid backing?: N/A, no rugs All stairs or steps have railings?: N/A, no stairs Grab bars in the bathtub or shower?: yes Have non-skid surface in bathtub or shower?: yes Good home lighting?: yes Regular seat belt use?: yes Hospital stays in the last year:: no  Cognitive Assessment Difficulty concentrating, remembering, or making decisions? : no Will 6CIT or Mini Cog be Completed: no 6CIT or Mini Cog Declined: patient alert, oriented, able to answer questions appropriately and recall recent events  Advance Directives (For Healthcare) Does Patient Have a Medical Advance Directive?: No Would patient like information on creating a medical advance directive?: No - Patient declined  Reviewed/Updated  Reviewed/Updated: Reviewed All (Medical, Surgical, Family, Medications, Allergies, Care Teams, Patient Goals)    Allergies (verified) Niaspan [niacin er (antihyperlipidemic)], Oxycodone -acetaminophen , and Statins   Current Medications (verified) Outpatient Encounter Medications as of 10/10/2024  Medication Sig   acetaminophen  (TYLENOL ) 650 MG CR tablet Take 650 mg by mouth daily as needed for pain.   Ascorbic Acid  (VITAMIN C WITH ROSE HIPS) 1000 MG tablet Take 1,000 mg by mouth daily. With Zinc   aspirin  EC 81 MG tablet Take 81 mg by mouth daily. Swallow whole.   baclofen  (LIORESAL ) 10 MG tablet Take 1 tablet (10 mg total) by mouth at bedtime as needed for muscle spasms.   Bempedoic Acid-Ezetimibe  (NEXLIZET ) 180-10 MG TABS TAKE 1 TABLET BY MOUTH ONCE DAILY AT 12 PM   calcitRIOL (ROCALTROL) 0.25 MCG capsule Take 0.25 mcg by mouth daily.   Cholecalciferol  (VITAMIN  D3) 1000  UNITS CAPS Take 1,000 Units by mouth every morning.    EPINEPHrine  (EPI-PEN) 0.3 mg/0.3 mL DEVI Inject 0.3 mg into the muscle as needed (anaphylaxis).   esomeprazole  (NEXIUM ) 40 MG capsule Take 1 capsule (40 mg total) by mouth daily.   ezetimibe  (ZETIA ) 10 MG tablet Take 1 tablet (10 mg total) by mouth daily.   fluticasone  (FLONASE ) 50 MCG/ACT nasal spray Use 2 spray(s) in each nostril once daily   Multiple Vitamins-Minerals (CENTRUM SILVER PO) Take 1 tablet by mouth every morning.    nitroGLYCERIN  (NITROSTAT ) 0.4 MG SL tablet Place 1 tablet (0.4 mg total) under the tongue every 5 (five) minutes as needed for chest pain.   Olmesartan -amLODIPine -HCTZ 40-10-12.5 MG TABS Take 1 tablet by mouth daily.   polyethylene glycol (MIRALAX  / GLYCOLAX ) 17 g packet Take 17 g by mouth daily.   QUEtiapine  (SEROQUEL ) 25 MG tablet Take 1 tablet (25 mg total) by mouth at bedtime. For sleep   tolterodine  (DETROL ) 2 MG tablet TAKE 1 TABLET BY MOUTH IN THE EVENING FOR BLADDER   denosumab  (PROLIA ) 60 MG/ML SOSY injection Inject 60 mg into the skin every 6 (six) months. (Patient not taking: Reported on 10/10/2024)   No facility-administered encounter medications on file as of 10/10/2024.    History: Past Medical History:  Diagnosis Date   Allergic rhinitis, cause unspecified    Allergy    Anxiety    Chronic kidney disease    Coronary artery disease    DDD (degenerative disc disease), lumbar    Dysmetabolic syndrome X    Dysuria    Esophageal reflux    Essential hypertension, benign    HNP (herniated nucleus pulposus), lumbar    Dr. Avanell [facility])   Hyperlipidemia    Lumbago    Lumbar radiculitis    Dr. 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    Toxic effect of venom(989.5)    Unspecified iridocyclitis    Unspecified vitamin D  deficiency    Vertigo 2018   1 episode   Past Surgical History:  Procedure Laterality Date   ABDOMINAL HYSTERECTOMY  1970   ANKLE SURGERY Right 1980   ANTERIOR LATERAL LUMBAR FUSION WITH PERCUTANEOUS SCREW 2 LEVEL N/A 12/16/2020   Procedure: L2/3, L3/4 LATERAL LUMBAR FUSION, L2-4 PEDICLE SCREW FIXATION;  Surgeon: Bluford Standing, MD;  Location: [facility] ORS;  Service: Neurosurgery;  Laterality: N/A;   BLADDER SURGERY  2012   CARDIAC CATHETERIZATION  2003   [place]   CARDIAC CATHETERIZATION     [place]   CATARACT EXTRACTION W/PHACO Right 02/09/2018   Procedure: CATARACT EXTRACTION PHACO AND INTRAOCULAR LENS PLACEMENT (IOC) RIGHT;  Surgeon: Mittie Gaskin, MD;  Location: [facility];  Service: Ophthalmology;  Laterality: Right;  sleep apnea   CATARACT EXTRACTION W/PHACO Left 03/09/2018   Procedure: CATARACT EXTRACTION PHACO AND INTRAOCULAR LENS PLACEMENT (IOC);  Surgeon: Mittie Gaskin, MD;  Location: [facility];  Service: Ophthalmology;  Laterality: Left;  IVA TOPICALLEFT   COLONOSCOPY  2008, 2015   Dr. Jinny   COLONOSCOPY N/A 03/05/2022   Procedure: COLONOSCOPY;  Surgeon: Onita Standing  Ozell, DO;  Location: [facility] ENDOSCOPY;  Service: Gastroenterology;  Laterality: N/A;   CYSTOSTOMY     ESOPHAGOGASTRODUODENOSCOPY N/A 03/05/2022   Procedure: ESOPHAGOGASTRODUODENOSCOPY (EGD);  Surgeon: Onita Elspeth Ozell, DO;  Location: [facility] END[facility];  Service: Gastroenterology;  Laterality: N/A;   KNEE SURGERY  08/18/2011   arthroscopic right   LUMBAR FUSION     L4-5, S-1   NECK SURGERY  2003   TOTAL KNEE ARTHROPLASTY Right 04/29/2015   Procedure: TOTAL KNEE ARTHROPLASTY;  Surgeon: Helayne Glenn, MD;  Location: [facility] ORS;  Service: Orthopedics;  Laterality: Right;   TUBAL LIGATION     Family History   Problem Relation Age of Onset   Kidney disease Mother    Hypertension Mother    CAD Father    Healthy Sister    Kidney disease Son    Kidney cancer Neg Hx    Bladder Cancer Neg Hx    Social History   Occupational History   Occupation: Retired  Tobacco Use   Smoking status: Never   Smokeless tobacco: Never   Tobacco comments:    smoking cessation materials not required  Vaping Use   Vaping status: Never Used  Substance and Sexual Activity   Alcohol use: No    Alcohol/week: 0.0 standard drinks of alcohol   Drug use: No   Sexual activity: Not Currently    Partners: Male    Birth control/protection: None   Tobacco Counseling Counseling given: Not Answered Tobacco comments: smoking cessation materials not required  SDOH Screenings   Food Insecurity: No Food Insecurity (10/10/2024)  Housing: Unknown (10/10/2024)  Transportation Needs: No Transportation Needs (10/10/2024)  Utilities: Not At Risk (10/10/2024)  Alcohol Screen: Low Risk  (07/19/2023)  Depression (PHQ2-9): Low Risk  (10/10/2024)  Financial Resource Strain: Low Risk  (09/18/2024)   [facility]  Physical Activity: Inactive (10/10/2024)  Social Connections: Socially Integrated (10/10/2024)  Stress: No Stress Concern Present (10/10/2024)  Tobacco Use: Low Risk  (10/10/2024)  Health Literacy: Adequate Health Literacy (10/10/2024)   See flowsheets for full screening details  Depression Screen PHQ 2 & 9 Depression Scale- Over the past 2 weeks, how often have you been bothered by any of the following problems? Little interest or pleasure in doing things: 0 Feeling down, depressed, or hopeless (PHQ Adolescent also includes...irritable): 0 PHQ-2 Total Score: 0 Trouble falling or staying asleep, or sleeping too much: 0 Feeling tired or having little energy: 0 Poor appetite or overeating (PHQ Adolescent also includes...weight loss): 0 Feeling bad about yourself - or that you are a failure or  have let yourself or your family down: 0 Trouble concentrating on things, such as reading the newspaper or watching television (PHQ Adolescent also includes...like school work): 0 Moving or speaking so slowly that other people could have noticed. Or the opposite - being so fidgety or restless that you have been moving around a lot more than usual: 0 Thoughts that you would be better off dead, or of hurting yourself in some way: 0 PHQ-9 Total Score: 0 If you checked off any problems, how difficult have these problems made it for you to do your work, take care of things at home, or get along with other people?: Not difficult at all  Depression Treatment Depression Interventions/Treatment : EYV7-0 Score <4 Follow-up Not Indicated     Goals Addressed   None          Objective:    Today's Vitals   10/10/24 1010  Weight: 190  lb (86.2 kg)  Height: 5' 2 (1.575 m)   Body mass index is 34.75 kg/m.  Hearing/Vision screen Hearing Screening - Comments:: Denies hearing difficulties   Vision Screening - Comments:: Wears eyeglasses/UTD/[facility] Immunizations and Health Maintenance Health Maintenance  Topic Date Due   Zoster Vaccines- Shingrix  (1 of 2) 09/18/1995   Influenza Vaccine  06/02/2024   COVID-19 Vaccine (2 - 2025-26 season) 07/03/2024   Mammogram  12/28/2024 (Originally 10/31/2024)   Medicare Annual Wellness (AWV)  10/10/2025   DTaP/Tdap/Td (3 - Td or Tdap) 05/25/2032   Pneumococcal Vaccine: 50+ Years  Completed   Bone Density Scan  Completed   Hepatitis C Screening  Completed   Meningococcal B Vaccine  Aged Out   Colonoscopy  Discontinued        Assessment/Plan:  This is a routine wellness examination for Jodi Kirby.  Patient Care Team: Sowles, Krichna, MD as PCP - General Gollan, Evalene PARAS, MD as Consulting Physician (Cardiology) Penne Knee, MD (Inactive) as Consulting Physician (Urology) Aundria Ladell POUR, MD as Consulting Physician  (Gastroenterology) Cherilyn Debby CROME, MD as Consulting Physician (Internal Medicine)  I have personally reviewed and noted the following in the patient's chart:   Medical and social history Use of alcohol, tobacco or illicit drugs  Current medications and supplements including opioid prescriptions. Functional ability and status Nutritional status Physical activity Advanced directives List of other physicians Hospitalizations, surgeries, and ER visits in previous 12 months Vitals Screenings to include cognitive, depression, and falls Referrals and appointments  No orders of the defined types were placed in this encounter.  In addition, I have reviewed and discussed with patient certain preventive protocols, quality metrics, and best practice recommendations. A written personalized care plan for preventive services as well as general preventive health recommendations were provided to patient.   Maevis Mumby L Starlit Raburn, CMA   10/10/2024   Return in 1 year (on 10/10/2025).  After Visit Summary: (MyChart) Due to this being a telephonic visit, the after visit summary with patients personalized plan was offered to patient via MyChart   Nurse Notes: Patient declines all due vaccines. Patient is up to date on all health maintenance with no concerns to address today.

## 2024-10-10 NOTE — Patient Instructions (Addendum)
 Jodi Kirby,  Thank you for taking the time for your Medicare Wellness Visit. I appreciate your continued commitment to your health goals. Please review the care plan we discussed, and feel free to reach out if I can assist you further.  Please note that Annual Wellness Visits do not include a physical exam. Some assessments may be limited, especially if the visit was conducted virtually. If needed, we may recommend an in-person follow-up with your provider.  Ongoing Care Seeing your primary care provider every 3 to 6 months helps us  monitor your health and provide consistent, personalized care. Next office visit on 12/28/2024.  Aim for 30 minutes of exercise or brisk walking, 6-8 glasses of water , and 5 servings of fruits and vegetables each day.   Referrals If a referral was made during today's visit and you haven't received any updates within two weeks, please contact the referred provider directly to check on the status.  Recommended Screenings:  Health Maintenance  Topic Date Due   Zoster (Shingles) Vaccine (1 of 2) 09/18/1995   Medicare Annual Wellness Visit  05/23/2024   Flu Shot  06/02/2024   COVID-19 Vaccine (2 - 2025-26 season) 07/03/2024   Breast Cancer Screening  12/28/2024*   DTaP/Tdap/Td vaccine (3 - Td or Tdap) 05/25/2032   Pneumococcal Vaccine for age over 74  Completed   Osteoporosis screening with Bone Density Scan  Completed   Hepatitis C Screening  Completed   Meningitis B Vaccine  Aged Out   Colon Cancer Screening  Discontinued  *Topic was postponed. The date shown is not the original due date.       10/10/2024   10:16 AM  Advanced Directives  Does Patient Have a Medical Advance Directive? No    Vision: Annual vision screenings are recommended for early detection of glaucoma, cataracts, and diabetic retinopathy. These exams can also reveal signs of chronic conditions such as diabetes and high blood pressure.  Dental: Annual dental screenings help detect  early signs of oral cancer, gum disease, and other conditions linked to overall health, including heart disease and diabetes.  Please see the attached documents for additional preventive care recommendations.

## 2024-10-25 ENCOUNTER — Other Ambulatory Visit (HOSPITAL_COMMUNITY): Payer: Self-pay

## 2024-11-03 LAB — HM MAMMOGRAPHY

## 2024-11-17 ENCOUNTER — Telehealth: Payer: Self-pay

## 2024-11-17 NOTE — Telephone Encounter (Unsigned)
 Copied from CRM [id number]. Topic: Clinical - Request for Lab/Test Order >> Nov 17, 2024  3:11 PM Lonell PEDLAR wrote: Reason for CRM: Tylene from [facility] imaging called stating that she submitted a request for additional imaging on 11/03/24.  Patient had abnormal screening mammogram, they are requesting she come back for a diagnostic mammogram with tomo of the right breast and and ultrasound limited of the right breast can send imaging order to [id number]

## 2024-11-20 NOTE — Telephone Encounter (Signed)
 faxed

## 2024-11-20 NOTE — Telephone Encounter (Signed)
 Order in PCP green folder to sign

## 2024-11-28 ENCOUNTER — Other Ambulatory Visit: Payer: Self-pay | Admitting: Family Medicine

## 2024-11-28 DIAGNOSIS — J302 Other seasonal allergic rhinitis: Secondary | ICD-10-CM

## 2024-11-28 DIAGNOSIS — F5104 Psychophysiologic insomnia: Secondary | ICD-10-CM

## 2024-11-29 NOTE — Telephone Encounter (Signed)
 Requested Prescriptions  Pending Prescriptions Disp Refills   fluticasone  (FLONASE ) 50 MCG/ACT nasal spray [Pharmacy Med Name: Fluticasone  Propionate 50 MCG/ACT Nasal Suspension] 48 g 0    Sig: Use 2 spray(s) in each nostril once daily     Ear, Nose, and Throat: Nasal Preparations - Corticosteroids Passed - 11/29/2024  8:51 AM      Passed - Valid encounter within last 12 months    Recent Outpatient Visits           5 months ago Chronic kidney disease, stage 3b [facility])   [place] Glen[place] Mire, MD   11 months ago Stage 3a chronic kidney disease [facility])   [place] [facility] Glenard Mire, MD       Future Appointments             In 4 weeks Sowles, Krichna, MD [facility], [place]   In 1 month Gollan, Timothy J, MD [facility]             QUEtiapine  (SEROQUEL ) 25 MG tablet [Pharmacy Med Name: QUEtiapine  Fumarate 25 MG Oral Tablet] 90 tablet 0    Sig: TAKE 1 TABLET BY MOUTH AT BEDTIME FOR SLEEP     Not Delegated - Psychiatry:  Antipsychotics - Second Generation (Atypical) - quetiapine  Failed - 11/29/2024  8:51 AM      Failed - This refill cannot be delegated      Failed - Lipid Panel in normal range within the last 12 months    Cholesterol, Total  Date Value Ref Range Status  11/08/2015 211 (H) 100 - 199 mg/dL Final   Cholesterol  Date Value Ref Range Status  06/27/2024 140 <200 mg/dL Final   LDL Cholesterol (Calc)  Date Value Ref Range Status  06/27/2024 67 mg/dL (calc) Final    Comment:    Reference range: <100 . Desirable range <100 mg/dL for primary prevention;   <70 mg/dL for patients with CHD or diabetic patients  with > or = 2 CHD risk factors. SABRA LDL-C is now calculated using the Martin-Hopkins  calculation, which is a validated novel method providing  better accuracy than the Friedewald equation in the  estimation of LDL-C.  Gladis APPLETHWAITE et al. SANDREA.  7986;689(80): 2061-2068  (http://education.[facility].com/faq/FAQ164)    HDL  Date Value Ref Range Status  06/27/2024 52 > OR = 50 mg/dL Final  98/93/7982 48 >60 mg/dL Final   Triglycerides  Date Value Ref Range Status  06/27/2024 126 <150 mg/dL Final         Passed - TSH in normal range and within 360 days    TSH  Date Value Ref Range Status  08/21/2024 1.025 0.350 - 4.500 uIU/mL Final    Comment:    Performed by a 3rd Generation assay with a functional sensitivity of <=0.01 uIU/mL. Performed at [facility], [address] [place], [place] [address]          Passed - Last BP in normal range    BP Readings from Last 1 Encounters:  09/05/24 134/63         Passed - Last Heart Rate in normal range    Pulse Readings from Last 1 Encounters:  09/05/24 68         Passed - Valid encounter within last 6 months    Recent Outpatient Visits           5 months ago Chronic kidney disease, stage 3b (  [facility])   [place] [facility] Glenard Mire, MD   11 months ago Stage 3a chronic kidney disease [facility])   [place] [facility] Sowles, Krichna, MD       Future Appointments             In 4 weeks Sowles, Krichna, MD [facility] [place]   In 1 month Gollan, Timothy J, MD [facility] - CBC within normal limits and completed in the last 12 months    WBC  Date Value Ref Range Status  08/21/2024 7.6 4.0 - 10.5 K/uL Final   RBC  Date Value Ref Range Status  08/21/2024 3.63 (L) 3.87 - 5.11 MIL/uL Final   RBC.  Date Value Ref Range Status  08/21/2024 3.58 (L) 3.87 - 5.11 MIL/uL Final   Hemoglobin  Date Value Ref Range Status  08/21/2024 10.3 (L) 12.0 - 15.0 g/dL Final   HGB  Date Value Ref Range Status  03/18/2014 11.9 (L) 12.0 - 16.0 g/dL Final   HCT  Date Value Ref Range Status  08/21/2024 30.2 (L) 36.0 - 46.0 % Final  03/18/2014 36.3 35.0  - 47.0 % Final   MCHC  Date Value Ref Range Status  08/21/2024 34.1 30.0 - 36.0 g/dL Final   [facility]  Date Value Ref Range Status  08/21/2024 28.4 26.0 - 34.0 pg Final   MCV  Date Value Ref Range Status  08/21/2024 83.2 80.0 - 100.0 fL Final  03/18/2014 84 80 - 100 fL Final   No results found for: PLTCOUNTKUC, LABPLAT, POCPLA RDW  Date Value Ref Range Status  08/21/2024 13.2 11.5 - 15.5 % Final  03/18/2014 12.3 11.5 - 14.5 % Final         Passed - CMP within normal limits and completed in the last 12 months    Albumin  Date Value Ref Range Status  08/21/2024 4.4 3.5 - 5.0 g/dL Final  94/82/7984 3.6 3.4 - 5.0 g/dL Final   Alkaline Phosphatase  Date Value Ref Range Status  08/21/2024 68 38 - 126 U/L Final  03/18/2014 91 Unit/L Final    Comment:    45-117 NOTE: New Reference Range 09/22/13    Alkaline phosphatase (APISO)  Date Value Ref Range Status  06/27/2024 47 37 - 153 U/L Final   ALT  Date Value Ref Range Status  08/21/2024 28 0 - 44 U/L Final   SGPT (ALT)  Date Value Ref Range Status  03/18/2014 24 12 - 78 U/L Final   AST  Date Value Ref Range Status  08/21/2024 24 15 - 41 U/L Final   SGOT(AST)  Date Value Ref Range Status  03/18/2014 26 15 - 37 Unit/L Final   BUN  Date Value Ref Range Status  08/21/2024 24 (H) 8 - 23 mg/dL Final  94/82/7984 11 7 - 18 mg/dL Final   Calcium   Date Value Ref Range Status  08/21/2024 9.6 8.9 - 10.3 mg/dL Final   Calcium , Total  Date Value Ref Range Status  03/18/2014 8.9 8.5 - 10.1 mg/dL Final   CO2  Date Value Ref Range Status  08/21/2024 26 22 - 32 mmol/L Final   Co2  Date Value Ref Range Status  03/18/2014 27 21 - 32 mmol/L Final   Creat  Date Value Ref Range Status  09/28/2023 1.48 (H) 0.60 - 1.00 mg/dL Final   Creatinine, Ser  Date Value Ref Range Status  08/21/2024 1.77 (H) 0.44 - 1.00 mg/dL Final   Glucose  Date Value Ref Range Status  03/18/2014 106 (H) 65 - 99 mg/dL Final   Glucose,  Bld  Date Value Ref Range Status  08/21/2024 113 (H) 70 - 99 mg/dL Final    Comment:    Glucose reference range applies only to samples taken after fasting for at least 8 hours.   Potassium  Date Value Ref Range Status  08/21/2024 3.8 3.5 - 5.1 mmol/L Final  03/18/2014 3.6 3.5 - 5.1 mmol/L Final   Sodium  Date Value Ref Range Status  08/21/2024 137 135 - 145 mmol/L Final  03/18/2014 141 136 - 145 mmol/L Final   Total Bilirubin  Date Value Ref Range Status  08/21/2024 0.5 0.0 - 1.2 mg/dL Final   Bilirubin,Total  Date Value Ref Range Status  03/18/2014 0.2 0.2 - 1.0 mg/dL Final   Bilirubin, Direct  Date Value Ref Range Status  06/27/2024 0.1 0.0 - 0.2 mg/dL Final   Indirect Bilirubin  Date Value Ref Range Status  06/27/2024 0.3 0.2 - 1.2 mg/dL (calc) Final   Protein, ur  Date Value Ref Range Status  08/01/2023 NEGATIVE NEGATIVE mg/dL Final   Total Protein  Date Value Ref Range Status  08/21/2024 7.4 6.5 - 8.1 g/dL Final  94/82/7984 7.0 6.4 - 8.2 g/dL Final   Total Protein ELP  Date Value Ref Range Status  08/21/2024 6.8 6.0 - 8.5 g/dL Corrected   GFR, Est African American  Date Value Ref Range Status  04/04/2021 56 (L) > OR = 60 mL/min/1.83m2 Final   eGFR  Date Value Ref Range Status  09/28/2023 36 (L) > OR = 60 mL/min/1.30m2 Final   GFR, Est Non African American  Date Value Ref Range Status  04/04/2021 49 (L) > OR = 60 mL/min/1.26m2 Final   GFR, Estimated  Date Value Ref Range Status  08/21/2024 29 (L) >60 mL/min Final    Comment:    (NOTE) Calculated using the CKD-EPI Creatinine Equation (2021)

## 2024-11-29 NOTE — Telephone Encounter (Signed)
 Requested medication (s) are due for refill today: yes  Requested medication (s) are on the active medication list: yes  Last refill:  06/27/24  Future visit scheduled: yes  Notes to clinic:  Unable to refill per protocol, cannot delegate.      Requested Prescriptions  Pending Prescriptions Disp Refills   QUEtiapine  (SEROQUEL ) 25 MG tablet [Pharmacy Med Name: QUEtiapine  Fumarate 25 MG Oral Tablet] 90 tablet 0    Sig: TAKE 1 TABLET BY MOUTH AT BEDTIME FOR SLEEP     Not Delegated - Psychiatry:  Antipsychotics - Second Generation (Atypical) - quetiapine  Failed - 11/29/2024  8:53 AM      Failed - This refill cannot be delegated      Failed - Lipid Panel in normal range within the last 12 months    Cholesterol, Total  Date Value Ref Range Status  11/08/2015 211 (H) 100 - 199 mg/dL Final   Cholesterol  Date Value Ref Range Status  06/27/2024 140 <200 mg/dL Final   LDL Cholesterol (Calc)  Date Value Ref Range Status  06/27/2024 67 mg/dL (calc) Final    Comment:    Reference range: <100 . Desirable range <100 mg/dL for primary prevention;   <70 mg/dL for patients with CHD or diabetic patients  with > or = 2 CHD risk factors. SABRA LDL-C is now calculated using the Martin-Hopkins  calculation, which is a validated novel method providing  better accuracy than the Friedewald equation in the  estimation of LDL-C.  Gladis APPLETHWAITE et al. SANDREA. 7986;689(80): 2061-2068  (http://education.[employer].com/faq/FAQ164)    HDL  Date Value Ref Range Status  06/27/2024 52 > OR = 50 mg/dL Final  98/93/7982 48 >60 mg/dL Final   Triglycerides  Date Value Ref Range Status  06/27/2024 126 <150 mg/dL Final         Passed - TSH in normal range and within 360 days    TSH  Date Value Ref Range Status  08/21/2024 1.025 0.350 - 4.500 uIU/mL Final    Comment:    Performed by a 3rd Generation assay with a functional sensitivity of <=0.01 uIU/mL. Performed at [facility], [address], [place], [place] [address]          Passed - Last BP in normal range    BP Readings from Last 1 Encounters:  09/05/24 134/63         Passed - Last Heart Rate in normal range    Pulse Readings from Last 1 Encounters:  09/05/24 68         Passed - Valid encounter within last 6 months    Recent Outpatient Visits           5 months ago Chronic kidney disease, stage 3b [facility])   [place] [facility] Glenard Mire, MD   11 months ago Stage 3a chronic kidney disease [facility])   [place] [facility] Glenard Mire, MD       Future Appointments             In 4 weeks Sowles, Krichna, MD [facility], [place]   In 1 month Gollan, Timothy J, MD [facility] at [facility] - CBC within normal limits and completed in the last 12 months    WBC  Date Value Ref Range Status  08/21/2024 7.6 4.0 - 10.5 K/uL Final   RBC  Date Value Ref Range  Status  08/21/2024 3.63 (L) 3.87 - 5.11 MIL/uL Final   RBC.  Date Value Ref Range Status  08/21/2024 3.58 (L) 3.87 - 5.11 MIL/uL Final   Hemoglobin  Date Value Ref Range Status  08/21/2024 10.3 (L) 12.0 - 15.0 g/dL Final   HGB  Date Value Ref Range Status  03/18/2014 11.9 (L) 12.0 - 16.0 g/dL Final   HCT  Date Value Ref Range Status  08/21/2024 30.2 (L) 36.0 - 46.0 % Final  03/18/2014 36.3 35.0 - 47.0 % Final   MCHC  Date Value Ref Range Status  08/21/2024 34.1 30.0 - 36.0 g/dL Final   [facility]  Date Value Ref Range Status  08/21/2024 28.4 26.0 - 34.0 pg Final   MCV  Date Value Ref Range Status  08/21/2024 83.2 80.0 - 100.0 fL Final  03/18/2014 84 80 - 100 fL Final   No results found for: PLTCOUNTKUC, LABPLAT, POCPLA RDW  Date Value Ref Range Status  08/21/2024 13.2 11.5 - 15.5 % Final  03/18/2014 12.3 11.5 - 14.5 % Final         Passed - CMP within normal limits and completed in the last 12 months    Albumin  Date  Value Ref Range Status  08/21/2024 4.4 3.5 - 5.0 g/dL Final  94/82/7984 3.6 3.4 - 5.0 g/dL Final   Alkaline Phosphatase  Date Value Ref Range Status  08/21/2024 68 38 - 126 U/L Final  03/18/2014 91 Unit/L Final    Comment:    45-117 NOTE: New Reference Range 09/22/13    Alkaline phosphatase (APISO)  Date Value Ref Range Status  06/27/2024 47 37 - 153 U/L Final   ALT  Date Value Ref Range Status  08/21/2024 28 0 - 44 U/L Final   SGPT (ALT)  Date Value Ref Range Status  03/18/2014 24 12 - 78 U/L Final   AST  Date Value Ref Range Status  08/21/2024 24 15 - 41 U/L Final   SGOT(AST)  Date Value Ref Range Status  03/18/2014 26 15 - 37 Unit/L Final   BUN  Date Value Ref Range Status  08/21/2024 24 (H) 8 - 23 mg/dL Final  94/82/7984 11 7 - 18 mg/dL Final   Calcium   Date Value Ref Range Status  08/21/2024 9.6 8.9 - 10.3 mg/dL Final   Calcium , Total  Date Value Ref Range Status  03/18/2014 8.9 8.5 - 10.1 mg/dL Final   CO2  Date Value Ref Range Status  08/21/2024 26 22 - 32 mmol/L Final   Co2  Date Value Ref Range Status  03/18/2014 27 21 - 32 mmol/L Final   Creat  Date Value Ref Range Status  09/28/2023 1.48 (H) 0.60 - 1.00 mg/dL Final   Creatinine, Ser  Date Value Ref Range Status  08/21/2024 1.77 (H) 0.44 - 1.00 mg/dL Final   Glucose  Date Value Ref Range Status  03/18/2014 106 (H) 65 - 99 mg/dL Final   Glucose, Bld  Date Value Ref Range Status  08/21/2024 113 (H) 70 - 99 mg/dL Final    Comment:    Glucose reference range applies only to samples taken after fasting for at least 8 hours.   Potassium  Date Value Ref Range Status  08/21/2024 3.8 3.5 - 5.1 mmol/L Final  03/18/2014 3.6 3.5 - 5.1 mmol/L Final   Sodium  Date Value Ref Range Status  08/21/2024 137 135 - 145 mmol/L Final  03/18/2014 141 136 - 145 mmol/L Final   Total Bilirubin  Date Value Ref Range Status  08/21/2024 0.5 0.0 - 1.2 mg/dL Final   Bilirubin,Total  Date Value Ref  Range Status  03/18/2014 0.2 0.2 - 1.0 mg/dL Final   Bilirubin, Direct  Date Value Ref Range Status  06/27/2024 0.1 0.0 - 0.2 mg/dL Final   Indirect Bilirubin  Date Value Ref Range Status  06/27/2024 0.3 0.2 - 1.2 mg/dL (calc) Final   Protein, ur  Date Value Ref Range Status  08/01/2023 NEGATIVE NEGATIVE mg/dL Final   Total Protein  Date Value Ref Range Status  08/21/2024 7.4 6.5 - 8.1 g/dL Final  94/82/7984 7.0 6.4 - 8.2 g/dL Final   Total Protein ELP  Date Value Ref Range Status  08/21/2024 6.8 6.0 - 8.5 g/dL Corrected   GFR, Est African American  Date Value Ref Range Status  04/04/2021 56 (L) > OR = 60 mL/min/1.61m2 Final   eGFR  Date Value Ref Range Status  09/28/2023 36 (L) > OR = 60 mL/min/1.81m2 Final   GFR, Est Non African American  Date Value Ref Range Status  04/04/2021 49 (L) > OR = 60 mL/min/1.97m2 Final   GFR, Estimated  Date Value Ref Range Status  08/21/2024 29 (L) >60 mL/min Final    Comment:    (NOTE) Calculated using the CKD-EPI Creatinine Equation (2021)          Signed Prescriptions Disp Refills   fluticasone  (FLONASE ) 50 MCG/ACT nasal spray 48 g 0    Sig: Use 2 spray(s) in each nostril once daily     Ear, Nose, and Throat: Nasal Preparations - Corticosteroids Passed - 11/29/2024  8:53 AM      Passed - Valid encounter within last 12 months    Recent Outpatient Visits           5 months ago Chronic kidney disease, stage 3b [facility])   [facility] Glenard Mire, MD   11 months ago Stage 3a chronic kidney disease [facility])   [place] [facility] Glenard Mire, MD       Future Appointments             In 4 weeks Sowles, Krichna, MD [facility], [place]   In 1 month Gollan, Timothy J, MD [facility] at [facility]

## 2024-12-05 ENCOUNTER — Inpatient Hospital Stay: Attending: Oncology

## 2024-12-05 DIAGNOSIS — D631 Anemia in chronic kidney disease: Secondary | ICD-10-CM

## 2024-12-05 LAB — CBC WITH DIFFERENTIAL (CANCER CENTER ONLY)
Abs Immature Granulocytes: 0.09 10*3/uL — ABNORMAL HIGH (ref 0.00–0.07)
Basophils Absolute: 0 10*3/uL (ref 0.0–0.1)
Basophils Relative: 0 %
Eosinophils Absolute: 0.1 10*3/uL (ref 0.0–0.5)
Eosinophils Relative: 1 %
HCT: 30.2 % — ABNORMAL LOW (ref 36.0–46.0)
Hemoglobin: 10.3 g/dL — ABNORMAL LOW (ref 12.0–15.0)
Immature Granulocytes: 1 %
Lymphocytes Relative: 24 %
Lymphs Abs: 2.8 10*3/uL (ref 0.7–4.0)
MCH: 28.9 pg (ref 26.0–34.0)
MCHC: 34.1 g/dL (ref 30.0–36.0)
MCV: 84.8 fL (ref 80.0–100.0)
Monocytes Absolute: 0.9 10*3/uL (ref 0.1–1.0)
Monocytes Relative: 7 %
Neutro Abs: 7.8 10*3/uL — ABNORMAL HIGH (ref 1.7–7.7)
Neutrophils Relative %: 67 %
Platelet Count: 263 10*3/uL (ref 150–400)
RBC: 3.56 MIL/uL — ABNORMAL LOW (ref 3.87–5.11)
RDW: 12.6 % (ref 11.5–15.5)
WBC Count: 11.6 10*3/uL — ABNORMAL HIGH (ref 4.0–10.5)
nRBC: 0 % (ref 0.0–0.2)

## 2024-12-05 LAB — IRON AND TIBC
Iron: 78 ug/dL (ref 28–170)
Saturation Ratios: 27 % (ref 10.4–31.8)
TIBC: 293 ug/dL (ref 250–450)
UIBC: 215 ug/dL

## 2024-12-05 LAB — FERRITIN: Ferritin: 237 ng/mL (ref 11–307)

## 2024-12-07 NOTE — Progress Notes (Unsigned)
 "   Referring Physician:  Carlisle Benton CROME, FNP [address] [place],  [place] [address]  Primary Physician:  Glenard Mire, MD  History of Present Illness: 12/07/2024 [name] is here today with a chief complaint of *** Neck pain that radiates to bilateral shoulders  Duration: 4 months?   Bowel/Bladder Dysfunction: none  Conservative measures:  Physical therapy: *** has not participated in? Multimodal medical therapy including regular antiinflammatories: Tylenol , Baclofen , Tizanidine, Prednisone Injections: 11/22/2024: Left C4-5 transforaminal ESI 04/16/2020: Right hip joint injection (good relief)  01/09/2020: Right L3-4 transforaminal ESI (moderate relief for 2 weeks) 12/06/2019: Right L3-4 transforaminal ESI (mild to moderate relief) 10/21/2018: Right L3-4 transforaminal ESI (good relief) 09/09/2018: Right L3-4 transforaminal ESI (good relief of groin and anterior thigh pain, temporary relief of low back pain)   Past Surgery: 12/16/2020 L2/3, L3/4 LATERAL LUMBAR FUSION, L2-4 PEDICLE SCREW FIXATION   Jodi Kirby has ***no symptoms of cervical myelopathy.  The symptoms are causing a significant impact on the patient's life.   I have utilized the care everywhere function in epic to review the outside records available from external health systems.  Review of Systems:  A 10 point review of systems is negative, except for the pertinent positives and negatives detailed in the HPI.  Past Medical History: Past Medical History:  Diagnosis Date   Allergic rhinitis, cause unspecified    Allergy    Anxiety    Chronic kidney disease    Coronary artery disease    DDD (degenerative disc disease), lumbar    Dysmetabolic syndrome X    Dysuria    Esophageal reflux    Essential hypertension, benign    HNP (herniated nucleus pulposus), lumbar    Dr. Avanell [facility])   Hyperlipidemia    Lumbago    Lumbar radiculitis    Dr. 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    Toxic effect of venom(989.5)    Unspecified iridocyclitis    Unspecified vitamin D  deficiency    Vertigo 2018   1 episode    Past Surgical History: Past Surgical History:  Procedure Laterality Date   ABDOMINAL HYSTERECTOMY  1970   ANKLE SURGERY Right 1980   ANTERIOR LATERAL LUMBAR FUSION WITH PERCUTANEOUS SCREW 2 LEVEL N/A 12/16/2020   Procedure: L2/3, L3/4 LATERAL LUMBAR FUSION, L2-4 PEDICLE SCREW FIXATION;  Surgeon: Bluford Standing, MD;  Location: [facility] ORS;  Service: Neurosurgery;  Laterality: N/A;   BLADDER SURGERY  2012   CARDIAC CATHETERIZATION  2003   [place]   CARDIAC CATHETERIZATION     [place]   CATARACT EXTRACTION W/PHACO Right 02/09/2018   Procedure: CATARACT EXTRACTION PHACO AND INTRAOCULAR LENS PLACEMENT (IOC) RIGHT;  Surgeon: Mittie Gaskin, MD;  Location: [facility];  Service: Ophthalmology;  Laterality: Right;  sleep apnea   CATARACT EXTRACTION W/PHACO Left 03/09/2018   Procedure: CATARACT EXTRACTION PHACO AND INTRAOCULAR LENS PLACEMENT (IOC);  Surgeon: Mittie Gaskin, MD;  Location: [facility];  Service: Ophthalmology;  Laterality: Left;  IVA TOPICALLEFT   COLONOSCOPY  2008, 2015   Dr. Jinny   COLONOSCOPY N/A 03/05/2022  Procedure: COLONOSCOPY;  Surgeon: Onita Elspeth Sharper, DO;  Location: [facility] ENDOSCOPY;  Service: Gastroenterology;  Laterality: N/A;   CYSTOSTOMY     ESOPHAGOGASTRODUODENOSCOPY N/A 03/05/2022   Procedure: ESOPHAGOGASTRODUODENOSCOPY (EGD);  Surgeon: Onita Elspeth Sharper, DO;  [facility] END[facility];  Service: Gastroenterology;  Laterality: N/A;   KNEE SURGERY   08/18/2011   arthroscopic right   LUMBAR FUSION     L4-5, S-1   NECK SURGERY  2003   TOTAL KNEE ARTHROPLASTY Right 04/29/2015   Procedure: TOTAL KNEE ARTHROPLASTY;  Surgeon: Helayne Glenn, MD;  Location: [facility] ORS;  Service: Orthopedics;  Laterality: Right;   TUBAL LIGATION      Allergies: Allergies as of 12/20/2024 - Review Complete 10/10/2024  Allergen Reaction Noted   Niaspan [niacin er (antihyperlipidemic)] Other (See Comments) 02/09/2013   Oxycodone -acetaminophen  Nausea Only 09/24/2014   Statins Other (See Comments) 02/09/2013    Medications: Current Medications[1]  Social History: Social History[2]  Family Medical History: Family History  Problem Relation Age of Onset   Kidney disease Mother    Hypertension Mother    CAD Father    Healthy Sister    Kidney disease Son    Kidney cancer Neg Hx    Bladder Cancer Neg Hx     Physical Examination: There were no vitals filed for this visit.  General: Patient is in no apparent distress. Attention to examination is appropriate.  Neck:   Supple.  Full range of motion.  Respiratory: Patient is breathing without any difficulty.   NEUROLOGICAL:     Awake, alert, oriented to person, place, and time.  Speech is clear and fluent.   Cranial Nerves: Pupils equal round and reactive to light.  Facial tone is symmetric.  Facial sensation is symmetric. Shoulder shrug is symmetric. Tongue protrusion is midline.    Strength: Side Biceps Triceps Deltoid Interossei Grip Wrist Ext. Wrist Flex.  R 5 5 5 5 5 5 5   L 5 5 5 5 5 5 5    Side Iliopsoas Quads Hamstring PF DF EHL  R 5 5 5 5 5 5   L 5 5 5 5 5 5    Reflexes are ***2+ and symmetric at the biceps, triceps, brachioradialis, patella and achilles.   Hoffman's is absent. Clonus is absent  Bilateral upper and lower extremity sensation is intact to light touch ***.     No evidence of dysmetria noted.  Gait is normal.    Imaging: *** I have personally reviewed the images and  agree with the above interpretation.  Medical Decision Making/Assessment and Plan: Jodi Kirby is a pleasant 79 y.o. female with ***  There are no diagnoses linked to this encounter.   Thank you for involving me in the care of this patient.    Penne MICAEL Sharps MD/MSCR Neurosurgery     [1]  Current Outpatient Medications:    acetaminophen  (TYLENOL ) 650 MG CR tablet, Take 650 mg by mouth daily as needed for pain., Disp: , Rfl:    Ascorbic Acid  (VITAMIN C WITH ROSE HIPS) 1000 MG tablet, Take 1,000 mg by mouth daily. With Zinc, Disp: , Rfl:    aspirin  EC 81 MG tablet, Take 81 mg by mouth daily. Swallow whole., Disp: , Rfl:    baclofen  (LIORESAL ) 10 MG tablet, Take 1 tablet (10 mg total) by mouth at bedtime as needed for muscle spasms., Disp: 90 tablet, Rfl: 1   Bempedoic Acid-Ezetimibe  (NEXLIZET ) 180-10 MG TABS, TAKE 1 TABLET BY MOUTH ONCE DAILY AT 12 PM, Disp: , Rfl:  calcitRIOL (ROCALTROL) 0.25 MCG capsule, Take 0.25 mcg by mouth daily., Disp: , Rfl:    Cholecalciferol  (VITAMIN D3) 1000 UNITS CAPS, Take 1,000 Units by mouth every morning. , Disp: , Rfl:    denosumab  (PROLIA ) 60 MG/ML SOSY injection, Inject 60 mg into the skin every 6 (six) months. (Patient not taking: Reported on 10/10/2024), Disp: , Rfl:    EPINEPHrine  (EPI-PEN) 0.3 mg/0.3 mL DEVI, Inject 0.3 mg into the muscle as needed (anaphylaxis)., Disp: , Rfl:    esomeprazole  (NEXIUM ) 40 MG capsule, Take 1 capsule (40 mg total) by mouth daily., Disp: 90 capsule, Rfl: 1   ezetimibe  (ZETIA ) 10 MG tablet, Take 1 tablet (10 mg total) by mouth daily., Disp: 90 tablet, Rfl: 3   fluticasone  (FLONASE ) 50 MCG/ACT nasal spray, Use 2 spray(s) in each nostril once daily, Disp: 48 g, Rfl: 0   Multiple Vitamins-Minerals (CENTRUM SILVER PO), Take 1 tablet by mouth every morning. , Disp: , Rfl:    nitroGLYCERIN  (NITROSTAT ) 0.4 MG SL tablet, Place 1 tablet (0.4 mg total) under the tongue every 5 (five) minutes as needed for chest pain., Disp: 25  tablet, Rfl: 0   Olmesartan -amLODIPine -HCTZ 40-10-12.5 MG TABS, Take 1 tablet by mouth daily., Disp: 90 tablet, Rfl: 3   polyethylene glycol (MIRALAX  / GLYCOLAX ) 17 g packet, Take 17 g by mouth daily., Disp: 14 each, Rfl: 0   QUEtiapine  (SEROQUEL ) 25 MG tablet, Take 1 tablet (25 mg total) by mouth at bedtime. For sleep, Disp: 90 tablet, Rfl: 1   tolterodine  (DETROL ) 2 MG tablet, TAKE 1 TABLET BY MOUTH IN THE EVENING FOR BLADDER, Disp: , Rfl:  [2]  Social History Tobacco Use   Smoking status: Never   Smokeless tobacco: Never   Tobacco comments:    smoking cessation materials not required  Vaping Use   Vaping status: Never Used  Substance Use Topics   Alcohol use: No    Alcohol/week: 0.0 standard drinks of alcohol   Drug use: No   "

## 2024-12-20 ENCOUNTER — Ambulatory Visit: Admitting: Neurosurgery

## 2024-12-28 ENCOUNTER — Ambulatory Visit: Admitting: Family Medicine

## 2025-01-02 ENCOUNTER — Ambulatory Visit: Admitting: Cardiovascular Disease

## 2025-03-06 ENCOUNTER — Inpatient Hospital Stay: Admitting: Oncology

## 2025-03-06 ENCOUNTER — Inpatient Hospital Stay
